# Patient Record
Sex: Male | Born: 1940 | Race: White | Hispanic: No | Smoking: Former smoker
Health system: Southern US, Community
[De-identification: ages and names within clinical notes are randomized; demographics above are authoritative.]

## PROBLEM LIST (undated history)

## (undated) DIAGNOSIS — N200 Calculus of kidney: Secondary | ICD-10-CM

## (undated) DIAGNOSIS — M1990 Unspecified osteoarthritis, unspecified site: Secondary | ICD-10-CM

## (undated) DIAGNOSIS — R42 Dizziness and giddiness: Secondary | ICD-10-CM

## (undated) DIAGNOSIS — I10 Essential (primary) hypertension: Secondary | ICD-10-CM

## (undated) DIAGNOSIS — R0602 Shortness of breath: Secondary | ICD-10-CM

## (undated) DIAGNOSIS — I219 Acute myocardial infarction, unspecified: Secondary | ICD-10-CM

## (undated) DIAGNOSIS — Z9079 Acquired absence of other genital organ(s): Secondary | ICD-10-CM

## (undated) DIAGNOSIS — K219 Gastro-esophageal reflux disease without esophagitis: Secondary | ICD-10-CM

## (undated) DIAGNOSIS — J449 Chronic obstructive pulmonary disease, unspecified: Secondary | ICD-10-CM

## (undated) DIAGNOSIS — K222 Esophageal obstruction: Secondary | ICD-10-CM

## (undated) DIAGNOSIS — K5792 Diverticulitis of intestine, part unspecified, without perforation or abscess without bleeding: Secondary | ICD-10-CM

## (undated) DIAGNOSIS — I2510 Atherosclerotic heart disease of native coronary artery without angina pectoris: Secondary | ICD-10-CM

## (undated) DIAGNOSIS — N400 Enlarged prostate without lower urinary tract symptoms: Secondary | ICD-10-CM

## (undated) DIAGNOSIS — E785 Hyperlipidemia, unspecified: Secondary | ICD-10-CM

## (undated) HISTORY — DX: Hyperlipidemia, unspecified: E78.5

## (undated) HISTORY — PX: THYROID SURGERY: SHX805

## (undated) HISTORY — PX: BACK SURGERY: SHX140

## (undated) HISTORY — DX: Esophageal obstruction: K22.2

## (undated) HISTORY — DX: Atherosclerotic heart disease of native coronary artery without angina pectoris: I25.10

## (undated) HISTORY — PX: APPENDECTOMY: SHX54

## (undated) HISTORY — PX: CHOLECYSTECTOMY: SHX55

## (undated) HISTORY — PX: CORONARY ANGIOPLASTY: SHX604

---

## 1998-06-09 ENCOUNTER — Ambulatory Visit (HOSPITAL_COMMUNITY): Admission: RE | Admit: 1998-06-09 | Discharge: 1998-06-09 | Payer: Self-pay | Admitting: Internal Medicine

## 2001-03-05 ENCOUNTER — Emergency Department (HOSPITAL_COMMUNITY): Admission: EM | Admit: 2001-03-05 | Discharge: 2001-03-05 | Payer: Self-pay | Admitting: Unknown Physician Specialty

## 2001-03-06 ENCOUNTER — Encounter: Payer: Self-pay | Admitting: Emergency Medicine

## 2002-02-20 ENCOUNTER — Ambulatory Visit (HOSPITAL_COMMUNITY): Admission: RE | Admit: 2002-02-20 | Discharge: 2002-02-20 | Payer: Self-pay | Admitting: Internal Medicine

## 2002-02-20 ENCOUNTER — Encounter (INDEPENDENT_AMBULATORY_CARE_PROVIDER_SITE_OTHER): Payer: Self-pay | Admitting: Specialist

## 2002-02-21 ENCOUNTER — Encounter: Payer: Self-pay | Admitting: Internal Medicine

## 2002-06-24 ENCOUNTER — Emergency Department (HOSPITAL_COMMUNITY): Admission: EM | Admit: 2002-06-24 | Discharge: 2002-06-24 | Payer: Self-pay | Admitting: Emergency Medicine

## 2002-06-24 ENCOUNTER — Encounter: Payer: Self-pay | Admitting: Emergency Medicine

## 2003-02-05 ENCOUNTER — Emergency Department (HOSPITAL_COMMUNITY): Admission: EM | Admit: 2003-02-05 | Discharge: 2003-02-05 | Payer: Self-pay | Admitting: Emergency Medicine

## 2003-02-08 ENCOUNTER — Emergency Department (HOSPITAL_COMMUNITY): Admission: EM | Admit: 2003-02-08 | Discharge: 2003-02-08 | Payer: Self-pay | Admitting: Emergency Medicine

## 2003-02-08 ENCOUNTER — Encounter: Payer: Self-pay | Admitting: Emergency Medicine

## 2003-03-17 ENCOUNTER — Encounter: Payer: Self-pay | Admitting: Orthopedic Surgery

## 2003-03-17 ENCOUNTER — Encounter: Admission: RE | Admit: 2003-03-17 | Discharge: 2003-03-17 | Payer: Self-pay | Admitting: Orthopedic Surgery

## 2003-03-28 ENCOUNTER — Encounter: Payer: Self-pay | Admitting: Orthopedic Surgery

## 2003-03-28 ENCOUNTER — Encounter: Admission: RE | Admit: 2003-03-28 | Discharge: 2003-03-28 | Payer: Self-pay | Admitting: Orthopedic Surgery

## 2003-12-13 DIAGNOSIS — I219 Acute myocardial infarction, unspecified: Secondary | ICD-10-CM

## 2003-12-13 HISTORY — DX: Acute myocardial infarction, unspecified: I21.9

## 2003-12-13 HISTORY — PX: ANGIOPLASTY: SHX39

## 2004-04-21 ENCOUNTER — Emergency Department (HOSPITAL_COMMUNITY): Admission: EM | Admit: 2004-04-21 | Discharge: 2004-04-21 | Payer: Self-pay | Admitting: Emergency Medicine

## 2004-09-11 ENCOUNTER — Inpatient Hospital Stay (HOSPITAL_COMMUNITY): Admission: EM | Admit: 2004-09-11 | Discharge: 2004-09-15 | Payer: Self-pay | Admitting: Emergency Medicine

## 2005-02-23 ENCOUNTER — Ambulatory Visit (HOSPITAL_COMMUNITY): Admission: RE | Admit: 2005-02-23 | Discharge: 2005-02-23 | Payer: Self-pay | Admitting: Cardiology

## 2005-03-01 ENCOUNTER — Ambulatory Visit (HOSPITAL_COMMUNITY): Admission: RE | Admit: 2005-03-01 | Discharge: 2005-03-01 | Payer: Self-pay | Admitting: Cardiology

## 2006-03-06 ENCOUNTER — Ambulatory Visit (HOSPITAL_COMMUNITY): Admission: RE | Admit: 2006-03-06 | Discharge: 2006-03-06 | Payer: Self-pay | Admitting: Orthopedic Surgery

## 2006-08-11 ENCOUNTER — Emergency Department: Payer: Self-pay | Admitting: Emergency Medicine

## 2006-11-07 ENCOUNTER — Emergency Department (HOSPITAL_COMMUNITY): Admission: EM | Admit: 2006-11-07 | Discharge: 2006-11-07 | Payer: Self-pay | Admitting: Emergency Medicine

## 2006-12-11 ENCOUNTER — Emergency Department (HOSPITAL_COMMUNITY): Admission: EM | Admit: 2006-12-11 | Discharge: 2006-12-12 | Payer: Self-pay | Admitting: Emergency Medicine

## 2007-02-07 ENCOUNTER — Encounter: Admission: RE | Admit: 2007-02-07 | Discharge: 2007-02-07 | Payer: Self-pay | Admitting: Orthopedic Surgery

## 2008-01-07 ENCOUNTER — Ambulatory Visit (HOSPITAL_COMMUNITY): Admission: RE | Admit: 2008-01-07 | Discharge: 2008-01-07 | Payer: Self-pay | Admitting: Cardiology

## 2008-01-17 ENCOUNTER — Ambulatory Visit (HOSPITAL_COMMUNITY): Admission: RE | Admit: 2008-01-17 | Discharge: 2008-01-17 | Payer: Self-pay | Admitting: Cardiology

## 2008-09-27 ENCOUNTER — Ambulatory Visit (HOSPITAL_COMMUNITY): Admission: RE | Admit: 2008-09-27 | Discharge: 2008-09-27 | Payer: Self-pay | Admitting: Orthopedic Surgery

## 2008-12-29 ENCOUNTER — Observation Stay (HOSPITAL_COMMUNITY): Admission: EM | Admit: 2008-12-29 | Discharge: 2009-01-01 | Payer: Self-pay | Admitting: *Deleted

## 2008-12-29 ENCOUNTER — Ambulatory Visit: Payer: Self-pay | Admitting: Cardiovascular Disease

## 2008-12-29 ENCOUNTER — Ambulatory Visit: Payer: Self-pay | Admitting: *Deleted

## 2008-12-31 ENCOUNTER — Encounter: Payer: Self-pay | Admitting: Infectious Diseases

## 2009-04-16 ENCOUNTER — Encounter (INDEPENDENT_AMBULATORY_CARE_PROVIDER_SITE_OTHER): Payer: Self-pay | Admitting: *Deleted

## 2009-04-16 ENCOUNTER — Emergency Department (HOSPITAL_COMMUNITY): Admission: EM | Admit: 2009-04-16 | Discharge: 2009-04-17 | Payer: Self-pay | Admitting: Emergency Medicine

## 2009-05-04 ENCOUNTER — Ambulatory Visit (HOSPITAL_BASED_OUTPATIENT_CLINIC_OR_DEPARTMENT_OTHER): Admission: RE | Admit: 2009-05-04 | Discharge: 2009-05-04 | Payer: Self-pay | Admitting: Urology

## 2009-05-04 ENCOUNTER — Encounter (INDEPENDENT_AMBULATORY_CARE_PROVIDER_SITE_OTHER): Payer: Self-pay | Admitting: Urology

## 2009-08-22 ENCOUNTER — Ambulatory Visit: Payer: Self-pay | Admitting: Gastroenterology

## 2009-08-22 ENCOUNTER — Inpatient Hospital Stay (HOSPITAL_COMMUNITY): Admission: EM | Admit: 2009-08-22 | Discharge: 2009-08-24 | Payer: Self-pay | Admitting: Emergency Medicine

## 2009-08-24 ENCOUNTER — Encounter: Payer: Self-pay | Admitting: Internal Medicine

## 2009-08-25 ENCOUNTER — Encounter: Payer: Self-pay | Admitting: Internal Medicine

## 2009-09-24 ENCOUNTER — Telehealth: Payer: Self-pay | Admitting: Internal Medicine

## 2009-09-30 ENCOUNTER — Ambulatory Visit: Payer: Self-pay | Admitting: Gastroenterology

## 2009-09-30 DIAGNOSIS — C187 Malignant neoplasm of sigmoid colon: Secondary | ICD-10-CM | POA: Insufficient documentation

## 2009-09-30 DIAGNOSIS — I2510 Atherosclerotic heart disease of native coronary artery without angina pectoris: Secondary | ICD-10-CM | POA: Insufficient documentation

## 2009-10-07 ENCOUNTER — Telehealth: Payer: Self-pay | Admitting: Gastroenterology

## 2009-10-08 ENCOUNTER — Telehealth (INDEPENDENT_AMBULATORY_CARE_PROVIDER_SITE_OTHER): Payer: Self-pay | Admitting: *Deleted

## 2010-04-05 ENCOUNTER — Telehealth: Payer: Self-pay | Admitting: Gastroenterology

## 2010-04-06 ENCOUNTER — Encounter: Admission: RE | Admit: 2010-04-06 | Discharge: 2010-04-06 | Payer: Self-pay | Admitting: Otolaryngology

## 2010-04-13 ENCOUNTER — Other Ambulatory Visit: Admission: RE | Admit: 2010-04-13 | Discharge: 2010-04-13 | Payer: Self-pay | Admitting: Interventional Radiology

## 2010-04-13 ENCOUNTER — Encounter: Payer: Self-pay | Admitting: Gastroenterology

## 2010-04-13 ENCOUNTER — Encounter: Admission: RE | Admit: 2010-04-13 | Discharge: 2010-04-13 | Payer: Self-pay | Admitting: Otolaryngology

## 2010-06-22 ENCOUNTER — Encounter (HOSPITAL_COMMUNITY): Admission: RE | Admit: 2010-06-22 | Discharge: 2010-08-18 | Payer: Self-pay | Admitting: Cardiology

## 2010-06-29 ENCOUNTER — Ambulatory Visit (HOSPITAL_COMMUNITY): Admission: RE | Admit: 2010-06-29 | Discharge: 2010-06-29 | Payer: Self-pay | Admitting: Cardiology

## 2010-07-08 ENCOUNTER — Emergency Department (HOSPITAL_COMMUNITY): Admission: EM | Admit: 2010-07-08 | Discharge: 2010-07-08 | Payer: Self-pay | Admitting: Emergency Medicine

## 2010-07-22 ENCOUNTER — Ambulatory Visit (HOSPITAL_COMMUNITY): Admission: RE | Admit: 2010-07-22 | Discharge: 2010-07-23 | Payer: Self-pay | Admitting: Otolaryngology

## 2010-07-22 ENCOUNTER — Encounter (INDEPENDENT_AMBULATORY_CARE_PROVIDER_SITE_OTHER): Payer: Self-pay | Admitting: Otolaryngology

## 2010-09-03 ENCOUNTER — Telehealth: Payer: Self-pay | Admitting: Gastroenterology

## 2010-09-06 ENCOUNTER — Ambulatory Visit: Payer: Self-pay | Admitting: Gastroenterology

## 2010-09-06 DIAGNOSIS — R1032 Left lower quadrant pain: Secondary | ICD-10-CM

## 2010-09-08 ENCOUNTER — Telehealth: Payer: Self-pay | Admitting: Gastroenterology

## 2010-09-08 ENCOUNTER — Encounter (INDEPENDENT_AMBULATORY_CARE_PROVIDER_SITE_OTHER): Payer: Self-pay | Admitting: *Deleted

## 2010-09-27 ENCOUNTER — Telehealth: Payer: Self-pay | Admitting: Gastroenterology

## 2010-11-18 ENCOUNTER — Ambulatory Visit: Payer: Self-pay | Admitting: Gastroenterology

## 2010-11-20 ENCOUNTER — Emergency Department (HOSPITAL_COMMUNITY)
Admission: EM | Admit: 2010-11-20 | Discharge: 2010-11-20 | Payer: Self-pay | Source: Home / Self Care | Admitting: Emergency Medicine

## 2010-11-29 ENCOUNTER — Encounter: Payer: Self-pay | Admitting: Gastroenterology

## 2011-01-01 ENCOUNTER — Encounter: Payer: Self-pay | Admitting: Internal Medicine

## 2011-01-02 ENCOUNTER — Encounter: Payer: Self-pay | Admitting: Orthopedic Surgery

## 2011-01-09 ENCOUNTER — Emergency Department (HOSPITAL_COMMUNITY)
Admission: EM | Admit: 2011-01-09 | Discharge: 2011-01-09 | Payer: Self-pay | Source: Home / Self Care | Admitting: Emergency Medicine

## 2011-01-09 LAB — COMPREHENSIVE METABOLIC PANEL
ALT: 20 U/L (ref 0–53)
AST: 21 U/L (ref 0–37)
Alkaline Phosphatase: 52 U/L (ref 39–117)
CO2: 27 mEq/L (ref 19–32)
Calcium: 8.9 mg/dL (ref 8.4–10.5)
Chloride: 106 mEq/L (ref 96–112)
GFR calc Af Amer: >60 mL/min (ref >60)
GFR calc non Af Amer: >60 mL/min (ref >60)
Glucose, Bld: 87 mg/dL (ref 70–99)
Sodium: 141 mEq/L (ref 135–145)
Total Bilirubin: 0.5 mg/dL (ref 0.3–1.2)

## 2011-01-09 LAB — DIFFERENTIAL
Basophils Absolute: 0 10*3/uL (ref 0.0–0.1)
Basophils Relative: 0 % (ref 0–1)
Eosinophils Relative: 2 % (ref 0–5)
Monocytes Absolute: 0.7 10*3/uL (ref 0.1–1.0)
Monocytes Relative: 9 % (ref 3–12)

## 2011-01-09 LAB — LIPASE, BLOOD: Lipase: 23 U/L (ref 11–59)

## 2011-01-09 LAB — CBC
HCT: 37.1 % — ABNORMAL LOW (ref 39.0–52.0)
Hemoglobin: 12.2 g/dL — ABNORMAL LOW (ref 13.0–17.0)
MCH: 26.6 pg (ref 26.0–34.0)
MCHC: 32.9 g/dL (ref 30.0–36.0)
RDW: 14.7 % (ref 11.5–15.5)

## 2011-01-09 LAB — POCT CARDIAC MARKERS: Myoglobin, poc: 60.9 ng/mL (ref 12–200)

## 2011-01-11 ENCOUNTER — Encounter
Admission: RE | Admit: 2011-01-11 | Discharge: 2011-01-11 | Payer: Self-pay | Source: Home / Self Care | Attending: Orthopedic Surgery | Admitting: Orthopedic Surgery

## 2011-01-13 NOTE — Progress Notes (Signed)
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  I explained that to him at his last OV.  Schedule sig  ---- 04/01/2010 9:21 AM, Merri Ray CMA (AAMA) wrote: Jeanene Erb pt to inform him that he needed a 6 month follow up flex sig by your last office note on pt from october. Pt states he does not know anything about that and only knows about recall colon for 1 year. Do you still want me to schedule pt ------------------------------  Phone Note Outgoing Call Call back at [facility] Phone [phone]   Call placed by: Merri Ray CMA Duncan Dull),  April 05, 2010 9:47 AM Summary of Call: Called pt to inform that he needed to schedule the Flex, Pt will return my call he was on the way out the door for a doctors appointment Initial call taken by: Merri Ray CMA Duncan Dull),  April 05, 2010 9:48 AM  Follow-up for Phone Call        Tried to call pt again no answer. Will try again on Monday then mail out a reminder letter Follow-up by: Merri Ray CMA Duncan Dull),  April 09, 2010 3:21 PM  Additional Follow-up for Phone Call Additional follow up Details #1::        Mailed appointment letter today for pt to call back and schedule his Flex Sigmoidoscopy. This is very important that he gets this scheduled Additional Follow-up by: Merri Ray CMA Duncan Dull),  Apr 13, 2010 10:20 AM

## 2011-01-13 NOTE — Progress Notes (Signed)
Summary: cx fee?  Phone Note Call from Patient   Caller: Westley Hummer, wife Call For: [name] Reason for Call: Talk to Doctor Summary of Call: wife called this morning to cancel CIOL scheduled for this Wednesday... reason being, they are out of town for a "family emergency"... will c/b to reschedule when know more [name], do you wish to charge this pt a cancellation fee? Initial call taken by: Vallarie Mare,  September 27, 2010 9:24 AM  Follow-up for Phone Call        no Follow-up by: Louis Meckel MD,  September 27, 2010 10:17 AM

## 2011-01-13 NOTE — Progress Notes (Signed)
Summary: Triage-Abd. Pain  Phone Note Call from Patient Call back at Home Phone [phone]   Caller: wife Call For: [name] Reason for Call: Talk to Nurse Summary of Call: would like to be worked in for lower abd pain... doesnt want to wait until next available Initial call taken by: Vallarie Mare,  September 03, 2010 9:42 AM  Follow-up for Phone Call        Pt. c/o intermittent, lower abd. pain since Tuesday.  Denies constipation, diarrhea, blood, black stools, fever, n/v.   1) See Dr.Kaplan on 09-06-10 at 8:45am 2) Soft,bland diet. No spicy,greasy,fried foods.  3) tylenol/Ibuprofen as needed 4) Heating pad to abdomen as needed. 5) If symptoms become worse call back immediately or go to ER.  Follow-up by: Laureen Ochs LPN,  September 03, 2010 11:09 AM

## 2011-01-13 NOTE — Letter (Signed)
Summary: Miralax Instructions  [facility]  [address]   [place], [place] [address]   Phone: [phone]  Fax: [phone]       Francisco Collier    Nov 25, 1941    MRN: [id number]       Procedure Day /Date: Wednesday, 09-29-10     Arrival Time:  1:00 p.m.     Procedure Time: 2:00 p.m.     Location of Procedure:                    x   [facility] (4th Floor)   PREPARATION FOR COLONOSCOPY WITH MIRALAX  Starting 5 days prior to your procedure 09-24-10 do not eat nuts, seeds, popcorn, corn, beans, peas,  salads, or any raw vegetables.  Do not take any fiber supplements (e.g. Metamucil, Citrucel, and Benefiber). ____________________________________________________________________________________________________   THE DAY BEFORE YOUR PROCEDURE         DATE: 09-28-10 DAY: Tuesday  1   Drink clear liquids the entire day-NO SOLID FOOD  2   Do not drink anything colored red or purple.  Avoid juices with pulp.  No orange juice.  3   Drink at least 64 oz. (8 glasses) of fluid/clear liquids during the day to prevent dehydration and help the prep work efficiently.  CLEAR LIQUIDS INCLUDE: Water Jello Ice Popsicles Tea (sugar ok, no milk/cream) Powdered fruit flavored drinks Coffee (sugar ok, no milk/cream) Gatorade Juice: apple, white grape, white cranberry  Lemonade Clear bullion, consomm, broth Carbonated beverages (any kind) Strained chicken noodle soup Hard Candy  4   Mix the entire bottle of Miralax with 64 oz. of Gatorade/Powerade in the morning and put in the refrigerator to chill.  5   At 3:00 pm take 2 Dulcolax/Bisacodyl tablets.  6   At 4:30 pm take one Reglan/Metoclopramide tablet.  7  Starting at 5:00 pm drink one 8 oz glass of the Miralax mixture every 15-20 minutes until you have finished drinking the entire 64 oz.  You should finish drinking prep around 7:30 or 8:00 pm.  8   If you are nauseated, you may take the 2nd  Reglan/Metoclopramide tablet at 6:30 pm.        9    At 8:00 pm take 2 more DULCOLAX/Bisacodyl tablets.     THE DAY OF YOUR PROCEDURE      DATE:  09-29-10  DAY: Wednesday  You may drink clear liquids until  12:00 p.m.  (2 HOURS BEFORE PROCEDURE).   MEDICATION INSTRUCTIONS  Unless otherwise instructed, you should take regular prescription medications with a small sip of water as early as possible the morning of your procedure.            OTHER INSTRUCTIONS  You will need a responsible adult at least 68 years of age to accompany you and drive you home.   This person must remain in the waiting room during your procedure.  Wear loose fitting clothing that is easily removed.  Leave jewelry and other valuables at home.  However, you may wish to bring a book to read or an iPod/MP3 player to listen to music as you wait for your procedure to start.  Remove all body piercing jewelry and leave at home.  Total time from sign-in until discharge is approximately 2-3 hours.  You should go home directly after your procedure and rest.  You can resume normal activities the day after your procedure.  The day of your procedure you should not:  Drive   Make legal decisions   Operate machinery   Drink alcohol   Return to work  You will receive specific instructions about eating, activities and medications before you leave.   The above instructions have been reviewed and explained to me by   Karl Bales RN  September 08, 2010 9:38 AM    I fully understand and can verbalize these instructions _____________________________ Date _______  Instructions mailed to patient

## 2011-01-13 NOTE — Assessment & Plan Note (Signed)
Summary: WORSENING LOWER ABD. PAIN             DEBORAH   History of Present Illness Visit Type: Follow-up Visit Primary GI MD: Melvia Heaps MD [facility] Primary Provider: Winn Jock. Little, MD  Requesting Provider: n/a Chief Complaint: lower abd pain  History of Present Illness:   Francisco Collier has returned for evaluation of abdominal pain.  For approximately one week he has had persistent moderately severe left lower quadrant pain.  He is without fever.  He has a history of diverticulosis.  Approximate year ago a malignant polyp was removed at colonoscopy.  Polyp was broad-based.  Followup sigmoidoscopy was recommended in 6 months but this was not done.  He also had left lower quadrant pain approximately year ago and was treated for "infection".  He is without bleeding.   GI Review of Systems    Reports abdominal pain.     Location of  Abdominal pain: lower abdomen.    Denies acid reflux, belching, bloating, chest pain, dysphagia with liquids, dysphagia with solids, heartburn, loss of appetite, nausea, vomiting, vomiting blood, weight loss, and  weight gain.        Denies anal fissure, black tarry stools, change in bowel habit, constipation, diarrhea, diverticulosis, fecal incontinence, heme positive stool, hemorrhoids, irritable bowel syndrome, jaundice, light color stool, liver problems, rectal bleeding, and  rectal pain.    Current Medications (verified): 1)  Aspirin 81 Mg Tbec (Aspirin) .Marland Kitchen.. 1 By Mouth Once Daily 2)  Toprol Xl 50 Mg Xr24h-Tab (Metoprolol Succinate) .Marland Kitchen.. 1 By Mouth Once Daily 3)  Trilipix 135 Mg Cpdr (Choline Fenofibrate) .... One Tablet By Mouth Once Daily 4)  Lisinopril 10 Mg Tabs (Lisinopril) .... One Tablet By Mouth Once Daily  Allergies (verified): No Known Drug Allergies  Past History:  Past Medical History: Reviewed history from 09/30/2009 and no changes required. CAD Diverticulosis Esophageal Stricture Hypertension Benign prostatic  hypertrophy Dyslipidemia colon polyps Arthritis Kidney Stones  Past Surgical History: Appendectomy Angioplasty/stent  Thyroid Surgery  Family History: Reviewed history from 09/30/2009 and no changes required. No FH of Colon Cancer: Lung Cancer: Father   Social History: Reviewed history from 09/30/2009 and no changes required. Occupation: Retired Married No childern Patient is a former smoker.  Alcohol Use - no Illicit Drug Use - no  Review of Systems       The patient complains of arthritis/joint pain, back pain, and fatigue.  The patient denies allergy/sinus, anemia, anxiety-new, blood in urine, breast changes/lumps, change in vision, confusion, cough, coughing up blood, depression-new, fainting, fever, headaches-new, hearing problems, heart murmur, heart rhythm changes, itching, muscle pains/cramps, night sweats, nosebleeds, shortness of breath, skin rash, sleeping problems, sore throat, swelling of feet/legs, swollen lymph glands, thirst - excessive, urination - excessive, urination changes/pain, urine leakage, vision changes, and voice change.         All other systems were reviewed and were negative   Vital Signs:  Patient profile:   70 year old male Height:      73 inches Weight:      232 pounds BMI:     30.72 BSA:     2.29 Pulse rate:   76 / minute Pulse rhythm:   regular BP sitting:   132 / 80  (left arm) Cuff size:   regular  Vitals Entered By: Ok Anis CMA (September 06, 2010 8:19 AM)  Physical Exam  Additional Exam:  Is a well-developed well-nourished male  skin: anicteric HEENT: normocephalic; PEERLA; no nasal or  pharyngeal abnormalities neck: supple nodes: no cervical lymphadenopathy chest: clear to ausculatation and percussion heart: no murmurs, gallops, or rubs abd: soft, nontender; BS normoactive; no abdominal masses,organomegaly; there is moderate tenderness to palpation in the left lower quadrant without guarding or rebound rectal:  deferred ext: no cynanosis, clubbing, edema skeletal: no deformities neuro: oriented x 3; no focal abnormalities    Impression & Recommendations:  Problem # 1:  ABDOMINAL PAIN, LEFT LOWER QUADRANT (ICD-789.04)  He likely has low-grade diverticulitis.  Recommendations #1 begin Cipro 500 mg b.i.d. for 10 days and Flagyl 250 mg t.i.d. 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  I will defer this for 2-3 weeks to allow his diverticulitis to subside.  Orders: Colonoscopy (Colon)  Patient Instructions: 1)  Copy sent to : Winn Jock. Little, MD  2)  Colonoscopy and Flexible Sigmoidoscopy brochure given.  3)  Conscious Sedation brochure given.  4)  Your colonoscopy is scheduled on 09/29/2010 at 2pm 5)  The medication list was reviewed and reconciled.  All changed / newly prescribed medications were explained.  A complete medication list was provided to the patient / caregiver. Prescriptions: MOVIPREP 100 GM  SOLR (PEG-KCL-NACL-NASULF-NA ASC-C) As per prep instructions.  #1 x 0   Entered by:   Merri Ray CMA (AAMA)   Authorized by:   Louis Meckel MD   Signed by:   Merri Ray CMA (AAMA) on 09/06/2010   Method used:   Electronically to        [facility] (retail)       [address]       [place], [place]  [address]       Ph: [phone]       Fax: [phone]   RxID:   [id number] FLAGYL 250 MG TABS (METRONIDAZOLE) 1 by mouth three times a day for 10 days  #30 x 0   Entered by:   Merri Ray CMA (AAMA)   Authorized by:   Louis Meckel MD   Signed by:   Merri Ray CMA (AAMA) on 09/06/2010   Method used:   Electronically to        [facility] (retail)       [address]       [place], [place]  [address]       Ph: [phone]       Fax: [phone]   RxID:   [id number] CIPRO 500 MG TABS  (CIPROFLOXACIN HCL) 1 by mouth two times a day for 10 days  #20 x 0   Entered by:   Merri Ray CMA (AAMA)   Authorized by:   Louis Meckel MD   Signed by:   Merri Ray CMA (AAMA) on 09/06/2010   Method used:   Electronically to        [facility] (retail)       [address]       [place], [place]  [address]       Ph: [phone]       Fax: [phone]   RxID:   [id number]

## 2011-01-13 NOTE — Letter (Signed)
Summary: 3rd Appointment Reminder for Flex Sigmoidoscopy  [facility]  [address] [place], [place] [address]   Phone: [phone]  Fax: [phone]        Apr 13, 2010 MRN: [id number]    [facility] [address] [place], [place]  [address]    Dear Mr. Pieper,   We have been unable to reach you by phone to schedule a follow up   [facility] Sigmoidoscopy appointment that was recommended for you by  Dr.Kaplan. It is very important that we reach you to schedule the   procedure. We hope that you allow [place] to participate in your health   care needs. Please contact us at [phone] at your earliest   convenience to schedule your appointment.           Sincerely,   Robert Kaplan,MD    Merri Ray CMA (AAMA)

## 2011-01-13 NOTE — Letter (Signed)
Summary: [facility] Instructions  [facility]  [address] [place], [place] [address]   Phone: [phone]  Fax: [phone]       Nakai Seifried    Jan 11, 1941    MRN: [id number]        Procedure Day /Date:WEDNESDAY  09/29/2010     Arrival Time:1PM     Procedure Time:2PM     Location of Procedure:                    X   [facility] (4th Floor)                        PREPARATION FOR COLONOSCOPY WITH MOVIPREP   Starting 5 days prior to your procedure10/14/2011 do not eat nuts, seeds, popcorn, corn, beans, peas,  salads, or any raw vegetables.  Do not take any fiber supplements (e.g. Metamucil, Citrucel, and Benefiber).  THE DAY BEFORE YOUR PROCEDURE         DATE: 09/28/2010 DAY: TUESDAY  1.  Drink clear liquids the entire day-NO SOLID FOOD  2.  Do not drink anything colored red or purple.  Avoid juices with pulp.  No orange juice.  3.  Drink at least 64 oz. (8 glasses) of fluid/clear liquids during the day to prevent dehydration and help the prep work efficiently.  CLEAR LIQUIDS INCLUDE: Water Jello Ice Popsicles Tea (sugar ok, no milk/cream) Powdered fruit flavored drinks Coffee (sugar ok, no milk/cream) Gatorade Juice: apple, white grape, white cranberry  Lemonade Clear bullion, consomm, broth Carbonated beverages (any kind) Strained chicken noodle soup Hard Candy                             4.  In the morning, mix first dose of MoviPrep solution:    Empty 1 Pouch A and 1 Pouch B into the disposable container    Add lukewarm drinking water to the top line of the container. Mix to dissolve    Refrigerate (mixed solution should be used within 24 hrs)  5.  Begin drinking the prep at 5:00 p.m. The MoviPrep container is divided by 4 marks.   Every 15 minutes drink the solution down to the next mark (approximately 8 oz) until the full liter is complete.   6.  Follow completed prep with 16 oz of clear liquid of your choice (Nothing red  or purple).  Continue to drink clear liquids until bedtime.  7.  Before going to bed, mix second dose of MoviPrep solution:    Empty 1 Pouch A and 1 Pouch B into the disposable container    Add lukewarm drinking water to the top line of the container. Mix to dissolve    Refrigerate  THE DAY OF YOUR PROCEDURE      DATE: 09/29/2010 DAY: WEDNESDAY  Beginning at 9a.m. (5 hours before procedure):         1. Every 15 minutes, drink the solution down to the next mark (approx 8 oz) until the full liter is complete.  2. Follow completed prep with 16 oz. of clear liquid of your choice.    3. You may drink clear liquids until12PM(2 HOURS BEFORE PROCEDURE).   MEDICATION INSTRUCTIONS  Unless otherwise instructed, you should take regular prescription medications with a small sip of water   as early as possible the morning of your procedure.  OTHER INSTRUCTIONS  You will need a responsible adult at least 68 years of age to accompany you and drive you home.   This person must remain in the waiting room during your procedure.  Wear loose fitting clothing that is easily removed.  Leave jewelry and other valuables at home.  However, you may wish to bring a book to read or  an iPod/MP3 player to listen to music as you wait for your procedure to start.  Remove all body piercing jewelry and leave at home.  Total time from sign-in until discharge is approximately 2-3 hours.  You should go home directly after your procedure and rest.  You can resume normal activities the  day after your procedure.  The day of your procedure you should not:   Drive   Make legal decisions   Operate machinery   Drink alcohol   Return to work  You will receive specific instructions about eating, activities and medications before you leave.    The above instructions have been reviewed and explained to me by   _______________________    I fully understand and can verbalize these  instructions _____________________________ Date _________

## 2011-01-13 NOTE — Letter (Signed)
Summary: Patient Notice- Polyp Results   [facility]  [address] [place], [place] [address]   Phone: [phone]  Fax: [phone]        November 29, 2010 MRN: [id number]    [facility] [address] [place], [place]  [address]    Dear Francisco Collier,  I am pleased to inform you that the colon polyp(s) removed during your recent colonoscopy was (were) found to be benign (no cancer detected) upon pathologic examination.  I recommend you have a repeat colonoscopy examination in 3_ years to look for recurrent polyps, as having colon polyps increases your risk for having recurrent polyps or even colon cancer in the future.  Should you develop new or worsening symptoms of abdominal pain, bowel habit changes or bleeding from the rectum or bowels, please schedule an evaluation with either your primary care physician or with me.  Additional information/recommendations:  __ No further action with gastroenterology is needed at this time. Please      follow-up with your primary care physician for your other healthcare      needs.  __ Please call [phone] to schedule a return visit to review your      situation.  __ Please keep your follow-up visit as already scheduled.  __x Continue treatment plan as outlined the day of your exam.  Please call us if you are having persistent problems or have questions about your condition that have not been fully answered at this time.  Sincerely Barbette Hair. Arlyce Dice, MD This letter has been electronically signed by your physician.  Appended Document: Patient Notice- Polyp Results letter mailed

## 2011-01-13 NOTE — Progress Notes (Signed)
Summary: Medication  Phone Note Call from Patient Call back at Home Phone [phone]   Caller: Patient Call For: [name] Reason for Call: Talk to Nurse Summary of Call: Miralax is too expensive...needs an alternative Initial call taken by: Karna Christmas,  September 08, 2010 11:05 AM  Follow-up for Phone Call        Converted from flag ---- ---- 09/08/2010 11:28 AM, Louis Meckel MD wrote: Try Mg citrate 2 bottles on eve before procedure, 1 bottle 4 hours before procedure.  Drink plenty of clear liquids  ---- 09/08/2010 11:22 AM, Karl Bales RN wrote: I already spoke to pt's wife this morning- changed prep to Miralax due to cost of Moviprep.  She is calling now because Mirlax ($26.00) is too expensive and they cannot afford it on their fixed income.  Please advise ------------------------------   Additional Follow-up for Phone Call Additional follow up Details #1::        Spoke with pt's wife and informed her of [name] above orders.  Understanding voiced.  No further questions at this time Additional Follow-up by: Karl Bales RN,  September 08, 2010 11:37 AM

## 2011-01-13 NOTE — Procedures (Signed)
Summary: Colonoscopy  Patient: Francisco Collier Note: All result statuses are Final unless otherwise noted.  Tests: (1) Colonoscopy (COL)   COL Colonoscopy           DONE     [facility]     [address]     [place], [place]  [address]           COLONOSCOPY PROCEDURE REPORT           PATIENT:  Francisco Collier  MR#:  [id number]     BIRTHDATE:  [birth year], 69 yrs. old  GENDER:  male           ENDOSCOPIST:  Barbette Hair. Arlyce Dice, MD     Referred by:  Aida Puffer, M.D.           PROCEDURE DATE:  11/18/2010     PROCEDURE:  Colonoscopy with snare polypectomy, Colon with cold     biopsy polypectomy     ASA CLASS:  Class II     INDICATIONS:  1) screening  2) history of colon cancer 2010     malignant polyp removed           MEDICATIONS:   Fentanyl 75 mcg IV, Versed 10 mg IV           DESCRIPTION OF PROCEDURE:   After the risks benefits and     alternatives of the procedure were thoroughly explained, informed     consent was obtained.  Digital rectal exam was performed and     revealed no abnormalities.   The LB [id number] endoscope     was introduced through the anus and advanced to the cecum, which     was identified by the ileocecal valve, without limitations.  The     quality of the prep was Moviprep fair.  The instrument was then     slowly withdrawn as the colon was fully examined.     <<PROCEDUREIMAGES>>           FINDINGS:  A sessile polyp was found in the distal transverse     colon. It was 3 mm in size. The polyp was removed using cold     biopsy forceps (see image6).  A sessile polyp was found in the     sigmoid colon. It was 3 mm in size. It was found 22 cm from the     point of entry. The polyp was removed using cold biopsy forceps     (see image13).  There were multiple polyps identified and removed.     in the rectum and sigmoid colon. Multiple 2-35mm hyperplastic     appearing polyps in rectum and sigmoid to 15cm (see image16 and     image15). Largest  polyp was removed with cold snare and submitted     to patholgy  Mild diverticulosis was found in the sigmoid colon     (see image11).  This was otherwise a normal examination of the     colon (see image1, image4, image14, and image17).   Retroflexed     views in the rectum revealed no abnormalities.    The time to     cecum =  1.75  minutes. The scope was then withdrawn (time =     13.75  min) from the patient and the procedure completed.           COMPLICATIONS:  None           ENDOSCOPIC IMPRESSION:  1) 3 mm sessile polyp in the distal transverse colon     2) 3 mm sessile polyp in the sigmoid colon     3) Polyps, multiple in the rectum and sigmoid colon     4) Mild diverticulosis in the sigmoid colon     5) Otherwise normal examination     RECOMMENDATIONS:     1) Colonoscopy 3 years           REPEAT EXAM:   3 year(s) Colonoscopy           ______________________________     Barbette Hair. Arlyce Dice, MD           CC:           n.     eSIGNED:   Barbette Hair. Kaplan at 11/18/2010 04:24 PM           Francisco Collier, [id number]  Note: An exclamation mark (!) indicates a result that was not dispersed into the flowsheet. Document Creation Date: 11/18/2010 4:24 PM _______________________________________________________________________  (1) Order result status: Final Collection or observation date-time: 11/18/2010 16:14 Requested date-time:  Receipt date-time:  Reported date-time:  Referring Physician:   Ordering Physician: Melvia Heaps [phone]) Specimen Source:  Source: Launa Grill Order Number: [id number] Lab site:   Appended Document: Colonoscopy     Procedures Next Due Date:    Colonoscopy: 11/2013

## 2011-01-13 NOTE — Progress Notes (Signed)
Summary: phone note, prep changed to due pt not able to afford Moviprep  Phone Note Call from Patient Call back at Home Phone [phone] Call back at Work Phone [phone]   Caller: wife Call For: [name] Reason for Call: Talk to Nurse Summary of Call: cannot afford prep and would like something cheaper... [facility] on [address] Initial call taken by: Vallarie Mare,  September 08, 2010 9:08 AM    New/Updated Medications: DULCOLAX 5 MG  TBEC (BISACODYL) Day before procedure take 2 at 3pm and 2 at 8pm. METOCLOPRAMIDE HCL 10 MG  TABS (METOCLOPRAMIDE HCL) As per prep instructions. MIRALAX   POWD (POLYETHYLENE GLYCOL 3350) As per prep  instructions. Prescriptions: MIRALAX   POWD (POLYETHYLENE GLYCOL 3350) As per prep  instructions.  #255gm x 0   Entered by:   Karl Bales RN   Authorized by:   Louis Meckel MD   Signed by:   Karl Bales RN on 09/08/2010   Method used:   Electronically to        [facility] (retail)       [address]       [place], [place]  [address]       Ph: [phone]       Fax: [phone]   RxID:   [id number] METOCLOPRAMIDE HCL 10 MG  TABS (METOCLOPRAMIDE HCL) As per prep instructions.  #2 x 0   Entered by:   Karl Bales RN   Authorized by:   Louis Meckel MD   Signed by:   Karl Bales RN on 09/08/2010   Method used:   Electronically to        [facility] (retail)       [address]       [place], [place]  [address]       Ph: [phone]       Fax: [phone]   RxID:   [id number] DULCOLAX 5 MG  TBEC (BISACODYL) Day before procedure take 2 at 3pm and 2 at 8pm.  #4 x 0   Entered by:   Karl Bales RN   Authorized by:   Louis Meckel MD   Signed by:   Karl Bales RN on 09/08/2010   Method used:   Electronically to        [facility] (retail)       [address]       [place], [place]  [address]      Ph: [phone]       Fax: [phone]   RxID:   [id number]  Spoke with  pt's wife who states that they "absolutely cannot afford the Moviprep for the procedure."  Miralax prep sent to [facility] [address] pharmacy and instructions for procedure mailed to pt.  Wife told to call back if she has further questions

## 2011-01-19 ENCOUNTER — Encounter (HOSPITAL_COMMUNITY)
Admission: RE | Admit: 2011-01-19 | Discharge: 2011-01-19 | Disposition: A | Discharge status: Home / Self Care | Payer: Medicare Other | Source: Ambulatory Visit | Attending: Specialist | Admitting: Specialist

## 2011-01-19 DIAGNOSIS — Z01812 Encounter for preprocedural laboratory examination: Secondary | ICD-10-CM | POA: Insufficient documentation

## 2011-01-19 DIAGNOSIS — M5126 Other intervertebral disc displacement, lumbar region: Secondary | ICD-10-CM | POA: Insufficient documentation

## 2011-01-19 LAB — COMPREHENSIVE METABOLIC PANEL
ALT: 23 U/L (ref 0–53)
Albumin: 3.8 g/dL (ref 3.5–5.2)
Calcium: 9.1 mg/dL (ref 8.4–10.5)
GFR calc Af Amer: >60 mL/min (ref >60)
Glucose, Bld: 105 mg/dL — ABNORMAL HIGH (ref 70–99)
Potassium: 4.8 mEq/L (ref 3.5–5.1)
Sodium: 135 mEq/L (ref 135–145)
Total Protein: 6.8 g/dL (ref 6.0–8.3)

## 2011-01-19 LAB — DIFFERENTIAL
Basophils Absolute: 0 10*3/uL (ref 0.0–0.1)
Basophils Relative: 0 % (ref 0–1)
Eosinophils Absolute: 0.1 10*3/uL (ref 0.0–0.7)
Eosinophils Relative: 2 % (ref 0–5)
Monocytes Absolute: 0.9 10*3/uL (ref 0.1–1.0)

## 2011-01-19 LAB — CBC
HCT: 40.3 % (ref 39.0–52.0)
MCHC: 33.7 g/dL (ref 30.0–36.0)
Platelets: 332 10*3/uL (ref 150–400)
RDW: 14.7 % (ref 11.5–15.5)
WBC: 8.6 10*3/uL (ref 4.0–10.5)

## 2011-01-19 LAB — SURGICAL PCR SCREEN: Staphylococcus aureus: NEGATIVE

## 2011-01-24 ENCOUNTER — Ambulatory Visit (HOSPITAL_COMMUNITY): Admission: RE | Admit: 2011-01-24 | Payer: Medicare Other | Source: Ambulatory Visit | Admitting: Specialist

## 2011-01-24 ENCOUNTER — Inpatient Hospital Stay (HOSPITAL_COMMUNITY)
Admission: RE | Admit: 2011-01-24 | Discharge: 2011-01-27 | DRG: 490 | Disposition: A | Discharge status: Home / Self Care | Payer: Medicare Other | Source: Ambulatory Visit | Attending: Specialist | Admitting: Specialist

## 2011-01-24 ENCOUNTER — Inpatient Hospital Stay (HOSPITAL_COMMUNITY): Payer: Medicare Other

## 2011-01-24 DIAGNOSIS — Z9861 Coronary angioplasty status: Secondary | ICD-10-CM

## 2011-01-24 DIAGNOSIS — I2510 Atherosclerotic heart disease of native coronary artery without angina pectoris: Secondary | ICD-10-CM | POA: Diagnosis present

## 2011-01-24 DIAGNOSIS — Z01812 Encounter for preprocedural laboratory examination: Secondary | ICD-10-CM

## 2011-01-24 DIAGNOSIS — M129 Arthropathy, unspecified: Secondary | ICD-10-CM | POA: Diagnosis present

## 2011-01-24 DIAGNOSIS — K219 Gastro-esophageal reflux disease without esophagitis: Secondary | ICD-10-CM | POA: Diagnosis present

## 2011-01-24 DIAGNOSIS — Z87891 Personal history of nicotine dependence: Secondary | ICD-10-CM

## 2011-01-24 DIAGNOSIS — M48061 Spinal stenosis, lumbar region without neurogenic claudication: Principal | ICD-10-CM | POA: Diagnosis present

## 2011-01-24 DIAGNOSIS — D62 Acute posthemorrhagic anemia: Secondary | ICD-10-CM | POA: Diagnosis not present

## 2011-01-24 DIAGNOSIS — I252 Old myocardial infarction: Secondary | ICD-10-CM

## 2011-01-24 LAB — APTT: aPTT: 27 seconds (ref 24–37)

## 2011-01-24 LAB — PROTIME-INR: INR: 1.01 (ref 0.00–1.49)

## 2011-01-25 LAB — CROSSMATCH
ABO/RH(D): A POS
Antibody Screen: NEGATIVE
Unit division: 0

## 2011-01-25 LAB — POCT I-STAT 4, (NA,K, GLUC, HGB,HCT)
Glucose, Bld: 113 mg/dL — ABNORMAL HIGH (ref 70–99)
Hemoglobin: 10.5 g/dL — ABNORMAL LOW (ref 13.0–17.0)
Potassium: 5.3 mEq/L — ABNORMAL HIGH (ref 3.5–5.1)
Sodium: 139 mEq/L (ref 135–145)

## 2011-01-25 LAB — BASIC METABOLIC PANEL
CO2: 25 mEq/L (ref 19–32)
Calcium: 7.8 mg/dL — ABNORMAL LOW (ref 8.4–10.5)
Chloride: 103 mEq/L (ref 96–112)
GFR calc Af Amer: >60 mL/min (ref >60)
Sodium: 134 mEq/L — ABNORMAL LOW (ref 135–145)

## 2011-01-26 LAB — HEMOGLOBIN AND HEMATOCRIT, BLOOD: Hemoglobin: 8.7 g/dL — ABNORMAL LOW (ref 13.0–17.0)

## 2011-02-08 NOTE — Op Note (Signed)
NAME:  Francisco Collier, Francisco Collier               ACCOUNT NO.:  [id number]  MEDICAL RECORD NO.:  [id number]           PATIENT TYPE:  I  LOCATION:  5025                         FACILITY:  [facility]  PHYSICIAN:  Kerrin Champagne, M.D.   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  The patient found to have severe foraminal entrapment right side greater than left affecting the L3, L4 and L5 nerve roots.  PROCEDURE:  Central decompressive laminectomy at L3-4 and L4-5 with removal of the spinous process and central portions of the lamina at each level.  Bilateral foraminotomies at L3, L4 and L5 levels. Operating room microscope was used for this procedure.  SURGEON:  Kerrin Champagne, MD  ASSISTANT:  Wende Neighbors, PA-C  ANESTHESIA:  General via orotracheal intubation, Dr. Krista Blue, supplemented with local infiltration of Marcaine 0.5% with 1:200,000 epinephrine 15 mL.  ESTIMATED BLOOD LOSS:  1300 mL.  DRAINS:  Foley to straight drain Hemovac x1, right lumbar.  COMPLICATIONS:  None.  BRIEF MEDICAL HISTORY:  The patient is a 70 year old male.  He has a long history of low back pain with claudication.  He has undergone previous epidural steroid injections with good relief of pain; however, most recently his pain has worsened to the point where he is unable to ambulate more than 100 feet without severe back pain and right lower extremity greater than left lower extremity pain.  His pain relief with sitting, bending, stooping and leaning over cart at the grocery store. He has some worsening discomfort with standing in  one place extending the back.  His sleep is markedly impaired secondary to back pain and leg pain.  He is brought to the operating room after further attempts to re- incise, then unsuccessful relieving his discomfort.  He has undergone preoperative evaluation demonstrating significant spinal stenosis at L3- 4 and L4-5 level spinal canal narrowing, especially in the lateral recess areas bilaterally at L3-4 and L4-5 foraminal entrapment.  The patient understands the risks and benefits of the procedure as a history of cardiac stent or MI in the past.  He has been cleared by his cardiologist, [name].  DESCRIPTION OF PROCEDURE:  This patient was seen in the preoperative holding area and has no allergies.  He received standard preoperative antibiotics of Ancef.  All questions were answered.  He has signed informed consent and marking of the back labeling L4-3 and L4-5, my initials right side greater than left noted.  Then taken to the OR via stretcher [facility] operating room OR #4, it is for the procedure.  There, he underwent induction of general anesthetic.  Foley catheter was placed.  He was then turned to a prone position on the [place] spine frame.  All pressure points were well padded.  PAS hose  were used intraoperatively.  The patient had standard prep with DuraPrep solution mid-dorsal spine to the mid sacral level.  Draped in the usual manner, iodine Vi-drape was used.  Standard time-out protocol identifying the patient, procedure and levels to be done.  Iliac crest was identified, this is in the level of L4.  Incision was then made approximately 2-2.5 inches in length through skin and subcutaneous layers directly down to the spinous processes at the expected L3, L4 and L5 levels.  Incision was carried over both sides of the spinous processes at the expected L3, L4, and L5 levels.  Kocher clamp was placed on either side of the spinous process of L4.  This would be  at the L4-5 level and at the L3-4 level.  Intraoperative lateral radiograph demonstrated the clamps on either side of the L4 spinous process.  The clamps were carefully removed and the areas where they were marked with a surgical marking pen.  Continued exposure then performed using Cobb elevators to elevate the paralumbar muscles, electrocautery to divide the extensive amount of muscle attachment to the patient's posterior elements extending from L2 to L5 bilaterally.  Bleeders were controlled using bipolar and monopolar electrocautery.  The boss McCullough retractor was inserted.  Self-retaining retractor was placed.  Leksell rongeur was then used to remove the spinous process of L4 and of L3 and about 50% of the inferior aspect of the spinous process of L2.  Small amount of superior portion of the L5 spinous process was resected about 10-15%.  Leksell rongeur was used to further thin central portions of the lamina at L4 and L3 and debride further soft tissue that was located over the posterior aspect of the interspace laterally.  Bleeders were again controlled using electrocautery.  This patient showed great deal of venous bleeding during the case even with the initial exposure significant veins.  Large joints that unfortunately covered the areas of the facet branches of the arteries and veins.  Loupe magnification and headlamp were used during this portion of the procedure.  Osteotome was then used carefully thin the posterior elements and then resect portion of the medial aspect of the inferior articular process of L4 bilaterally approximately 15-20% as well as at the L3 level bilaterally and L2 bilaterally.  Osteotome was used to further resect central portions of the lamina.  Thinning this area.  A high-speed bur was used to further thin the posterior aspect of the lamina at L4 and L3.  A 3-mm Kerrison then able to be introduced the midline and central portions of the lamina were  resected at L4 and L3.  Osteotomes were then used to resect further off the medial aspect of the lamina bilaterally opening the central laminotomy defect.  The resection was further carried out bilaterally until withdraw for about 15-16 mm in width was present from L4-5 to L3-4 superiorly.  Note that with resection, the very cranial portions of neural arch and cottonoids were placed over the thecal sac and were protected with resection centrally using Kerrison centrally. Ligamentum flavum was extremely hypertrophic both L3-4 and L4-5.  The Ligamentum flavum was first resected off superior aspect of the lamina of L5 and then foraminotomy was performed over both L5 nerve roots identified the medial aspect of the L5 pedicle and then osteotome was used to resect superior articular process of L5 bilaterally decompressing lateral recess and allowing for resection of the ligamentum flavum in a smooth fashion.  This was done bilaterally at the L4-5 level  and then at L3-4.  At L2-3, there was significant hypertrophic ligamentum flavum and again superior reticular process of L3 was resected in the region of lateral recess particularly on the right side in order to decompress the right L3 nerve root, its entry point into the neural foramen at L3-4.  With loupe magnification and headlamp, then carefully each neural foramen was decompressed first the L5, then L4, then L3 resecting reflected portions of the ligamentum flavum beneath the superior articular process of L5 and the L4-5 level and the L4 level at the L3-4 level.  Enough ligamentum flavum and superior portion of the superior articular processes were resected such with a hockey stick neuroprobe and blunt tip.  Nerve hook could be easily passed out the neural foramen demonstrating patency of L5, L4 and L3 of the right side.  Great deal of venous bleeding encountered, bipolar electrocautery unsuccessful in controlling bleeding, so  thrombin- soaked Gelfoam and cottonoids were used in order to control the bleeding here.  Bone wax was applied to bleeding cancellus bone surfaces. Returning to the left side and then in changing to the opposite side of the table to the right side of the table from left side.  Similarly, osteotomes were used to perform some resection of the medial aspect of the facet at the L4-5 level and L3-4 levels again identifying the medial portion of the pedicle of L5 and then of L4 and using this as a determination for osteotome resection of the superior articular process of L5 and of L4.  Ligamentum flavum and this wafer bone resected and smoothed the each segment decompressing the lateral recess quite nicely. Hockey stick neuroprobe passed out over the L5 nerve root demonstrating its patency, then over the L4 nerve root protecting the nerve root resection of the reflected portions of ligamentum flavum at the L4-5 level, it was carried out on the left side and similarly this was done at L3-4.  Medial portion of the L2-3, facet was also partially resected using osteotomes over about 10-15%.  The operating room microscope was sterilely draped and brought into the field under the operating room microscope, then each neural foramen lateral recess was carefully reexamined and further decompression carried out using two 3-mm Kerrisons to ensure that each neural foramen had been well decompressed first on the right side then on the left.  Bleeders were controlled using bipolar electrocautery.  Gelfoam and thrombin-soaked bone wax applied to the bleeding cancellous bone surfaces.  Excess bone wax was removed.  FloSeal was used at the end of the case.  There were several areas where bleeding was still present even despite the use of the pressure, cottonoids, and Gelfoam.  FloSeal provided excellent hemostasis.  At the end of the case, the patient has very minimal oozing present.  Irrigation was carried out  copious amounts of irrigant solution.  Medium Hemovac drain was placed in the depth of incision after again checking to insure that all neural foramen were well decompressed.  Ligamentum flavum at the L2-3 level was resected up to its attachment to the inferior aspect of the L2 laminas.  With the medium Hemovac drain in place, then the retractors were removed, a small amount of devitalized muscle was debrided along the edge of the incision.  Bleeding was controlled using bipolar and monopolar electrocautery.  The paralumbar muscles were loosely approximated in the midline with interrupted #1 Vicryl sutures.  Lumbodorsal fascia was reattached to the spinous process of L5 and at L2 using #1  Vicryl sutures in simple fashion.  The lumbodorsal fascia was reapproximated to itself over the laminotomy site using interrupted #1 Vicryl sutures, deep subcu layers were approximated with interrupted #1 and then 0 Vicryl sutures, more superficial layers with interrupted 2-0 Vicryl sutures.  The skin was closed with a running subcu stitch of 4-0 Vicryl. The skin was then closed with Octylseal  and then Mepilex bandage applied.  The patient was then returned to the supine position, reactivated, extubated, returned to recovery room in satisfactory condition.  PHYSICIAN ASSISTANT'S RESPONSIBILITY:  Maud Deed performed duties of an Designer, television/film set.  Sheaths were operated under the operating room microscope, performed retraction of the delicate neural structures, suctioning of the delicate neural structures and protection and that were needed.  She also assisted in resection of bone where osteotomized bone was performed.  She was present from the very beginning of the case to the very end of the case.  She performed closure from the fascia to the skin and application of dressings.  She assisted in positioning the patient and in removal of the patient from the OR table.  Without her assistance,  surgery certainly would have been very difficult procedure indeed.  At the end of the case, all instrument and sponge counts were correct.     Kerrin Champagne, M.D.     JEN/MEDQ  D:  01/25/2011  T:  01/26/2011  Job:  [id number]  Electronically Signed by Vira Browns M.D. on 02/08/2011 09:23:04 AM

## 2011-02-21 LAB — POCT I-STAT, CHEM 8
Calcium, Ion: 1.17 mmol/L (ref 1.12–1.32)
Glucose, Bld: 112 mg/dL — ABNORMAL HIGH (ref 70–99)
HCT: 38 % — ABNORMAL LOW (ref 39.0–52.0)
TCO2: 26 mmol/L (ref 0–100)

## 2011-02-21 LAB — DIFFERENTIAL
Eosinophils Absolute: 0.1 10*3/uL (ref 0.0–0.7)
Eosinophils Relative: 2 % (ref 0–5)
Lymphocytes Relative: 25 % (ref 12–46)
Lymphs Abs: 1.5 10*3/uL (ref 0.7–4.0)
Monocytes Absolute: 0.5 10*3/uL (ref 0.1–1.0)
Monocytes Relative: 8 % (ref 3–12)

## 2011-02-21 LAB — HEPATIC FUNCTION PANEL
ALT: 23 U/L (ref 0–53)
Alkaline Phosphatase: 66 U/L (ref 39–117)
Bilirubin, Direct: 0.1 mg/dL (ref 0.0–0.3)
Indirect Bilirubin: 0.2 mg/dL — ABNORMAL LOW (ref 0.3–0.9)
Total Bilirubin: 0.3 mg/dL (ref 0.3–1.2)

## 2011-02-21 LAB — URINALYSIS, ROUTINE W REFLEX MICROSCOPIC
Bilirubin Urine: NEGATIVE
Glucose, UA: NEGATIVE mg/dL
Hgb urine dipstick: NEGATIVE
Specific Gravity, Urine: 1.015 (ref 1.005–1.030)
Urobilinogen, UA: 0.2 mg/dL (ref 0.0–1.0)

## 2011-02-21 LAB — CBC
HCT: 37.5 % — ABNORMAL LOW (ref 39.0–52.0)
Hemoglobin: 12.2 g/dL — ABNORMAL LOW (ref 13.0–17.0)
MCH: 26.1 pg (ref 26.0–34.0)
MCV: 80.1 fL (ref 78.0–100.0)
Platelets: 276 10*3/uL (ref 150–400)
RBC: 4.68 MIL/uL (ref 4.22–5.81)

## 2011-02-21 LAB — LIPASE, BLOOD: Lipase: 22 U/L (ref 11–59)

## 2011-02-25 LAB — CBC
HCT: 39 % (ref 39.0–52.0)
Hemoglobin: 12.4 g/dL — ABNORMAL LOW (ref 13.0–17.0)
MCH: 25.8 pg — ABNORMAL LOW (ref 26.0–34.0)
MCHC: 31.8 g/dL (ref 30.0–36.0)
MCV: 81.1 fL (ref 78.0–100.0)
Platelets: 296 10*3/uL (ref 150–400)
RBC: 4.81 MIL/uL (ref 4.22–5.81)
RDW: 14.9 % (ref 11.5–15.5)
WBC: 7.1 10*3/uL (ref 4.0–10.5)

## 2011-02-25 LAB — BASIC METABOLIC PANEL WITH GFR
CO2: 24 meq/L (ref 19–32)
GFR calc non Af Amer: >60 mL/min (ref >60)
Glucose, Bld: 99 mg/dL (ref 70–99)
Potassium: 4.1 meq/L (ref 3.5–5.1)
Sodium: 137 meq/L (ref 135–145)

## 2011-02-25 LAB — BASIC METABOLIC PANEL
BUN: 11 mg/dL (ref 6–23)
Calcium: 8.8 mg/dL (ref 8.4–10.5)
Chloride: 108 mEq/L (ref 96–112)
Creatinine, Ser: 1.16 mg/dL (ref 0.4–1.5)
GFR calc Af Amer: >60 mL/min (ref >60)

## 2011-02-25 LAB — URINALYSIS, ROUTINE W REFLEX MICROSCOPIC
Bilirubin Urine: NEGATIVE
Glucose, UA: NEGATIVE mg/dL
Hgb urine dipstick: NEGATIVE
Ketones, ur: NEGATIVE mg/dL
Nitrite: NEGATIVE
Protein, ur: NEGATIVE mg/dL
Specific Gravity, Urine: 1.026 (ref 1.005–1.030)
Urobilinogen, UA: 1 mg/dL (ref 0.0–1.0)
pH: 5.5 (ref 5.0–8.0)

## 2011-02-25 LAB — SURGICAL PCR SCREEN
MRSA, PCR: NEGATIVE
Staphylococcus aureus: NEGATIVE

## 2011-02-26 LAB — CBC
HCT: 35.3 % — ABNORMAL LOW (ref 39.0–52.0)
MCHC: 34.5 g/dL (ref 30.0–36.0)
MCV: 79.2 fL (ref 78.0–100.0)
Platelets: 228 10*3/uL (ref 150–400)
RDW: 16.1 % — ABNORMAL HIGH (ref 11.5–15.5)

## 2011-02-26 LAB — POCT CARDIAC MARKERS
Myoglobin, poc: 68.1 ng/mL (ref 12–200)
Troponin i, poc: <0.05 ng/mL (ref 0.00–0.09)

## 2011-02-26 LAB — COMPREHENSIVE METABOLIC PANEL
Albumin: 3.4 g/dL — ABNORMAL LOW (ref 3.5–5.2)
Alkaline Phosphatase: 62 U/L (ref 39–117)
BUN: 11 mg/dL (ref 6–23)
Calcium: 9 mg/dL (ref 8.4–10.5)
Creatinine, Ser: 1.17 mg/dL (ref 0.4–1.5)
Glucose, Bld: 117 mg/dL — ABNORMAL HIGH (ref 70–99)
Total Protein: 6.2 g/dL (ref 6.0–8.3)

## 2011-02-26 LAB — DIFFERENTIAL
Basophils Relative: 2 % — ABNORMAL HIGH (ref 0–1)
Lymphocytes Relative: 23 % (ref 12–46)
Lymphs Abs: 1.4 10*3/uL (ref 0.7–4.0)
Monocytes Relative: 8 % (ref 3–12)
Neutro Abs: 3.8 10*3/uL (ref 1.7–7.7)
Neutrophils Relative %: 66 % (ref 43–77)

## 2011-02-26 LAB — URINALYSIS, ROUTINE W REFLEX MICROSCOPIC
Glucose, UA: NEGATIVE mg/dL
Protein, ur: NEGATIVE mg/dL
Specific Gravity, Urine: 1.024 (ref 1.005–1.030)

## 2011-03-02 NOTE — Discharge Summary (Signed)
NAME:  Francisco Collier, Francisco Collier               ACCOUNT NO.:  [id number]  MEDICAL RECORD NO.:  [id number]           PATIENT TYPE:  I  LOCATION:  5025                         FACILITY:  [facility]  PHYSICIAN:  Kerrin Champagne, M.D.   DATE OF BIRTH:  12-16-1940  DATE OF ADMISSION:  01/24/2011 DATE OF DISCHARGE:  01/27/2011                              DISCHARGE SUMMARY   ADMISSION DIAGNOSES: 1. Moderately severe lumbar spinal stenosis at L3-4 and L4-5 with     small disk herniation and worsening neurogenic claudication. 2. Hypertension. 3. Coronary artery disease status post stent placement in 2005. 4. Gastroesophageal reflux disease.  DISCHARGE DIAGNOSES: 1. Moderately severe lumbar spinal stenosis at L3-4 and L4-5 with     small disk herniations and worsening neurogenic claudication.  The     patient found to have severe foraminal entrapment, right side     greater than left, affecting the L3, L4, and L5 nerve roots     intraoperatively.  Remaining diagnosis same as above and including     acute blood loss anemia requiring blood transfusion. 2. Constipation, resolved at discharge.  PROCEDURE:  On January 24, 2011, the patient underwent central decompressive laminectomy at L3-4 and L4-5 with removal of the spinous process and central portions of the lamina at each level.  Bilateral foraminotomies at L3-L4 and L5 levels.  Operating room microscope used for the procedure.  This was performed by Dr. Otelia Sergeant, assisted by Maud Deed, PA-C, under general anesthesia.  CONSULTATIONS:  None.  BRIEF HISTORY:  The patient is a 70 year old male with chronic low back pain and progressive worsening of neurogenic claudication.  He has been treated with epidural steroid injections, but continues to have severe limitations with ambulation.  At this point, he has severe back pain with right greater than left lower extremity pain and is unable to walk more than 100 feet.  He is unable to do his grocery  shopping.  He has to lean over the cart for release during grocery shopping.  He is unable to sleep.  As he has failed conservative treatment, it was felt he would benefit from surgical intervention as his studies did indicate significant spinal stenosis at L3-4 and L4-5, especially in the lateral recess areas bilaterally at L3-4 and L4-5 with foraminal entrapment. The patient did have cardiac history and was cleared by [name] for the procedure.  BRIEF HOSPITAL COURSE:  The patient tolerated the procedure under general anesthesia.  Postoperatively, his blood pressure did drop into the 80s.  He was placed on the telemetry unit.  Hemoglobin and hematocrit dropped postoperatively and he was transfused with 2 units of packed red blood cells having had approximately 1300 mL of blood loss intraoperatively.  His hemoglobin dropped from 13.6 on admission to 9.5. Following the blood transfusion, he returned to 9.5, and at discharge hemoglobin was 8.7 with hematocrit 26.3.  He was stable without signs or symptoms of the posthemorrhagic anemia.  The patient was slow with return of his bowel function.  His diet was held until he had bowel sounds and flatus.  The patient eventually was able to take  a regular diet without difficulties.  He was started on ambulation and gait training by physical therapist.  He was able to walk in the hallway prior to discharge.  Neurovascular motor function of the lower extremities remained intact throughout the hospital stay.  The patient's dressing was changed daily and his wound was found to be healing without drainage.  Prior to discharge, he was afebrile and vital signs were stable.  The patient was discharged to his home on January 27, 2011.  PERTINENT LABORATORY VALUES:  Hemoglobin and hematocrit as stated above. Chemistry studies on admission were within normal limits. Postoperatively, potassium elevated at 5.3 but returned to normal value of 4.0.  One  episode of hyponatremia at 134.  He was on preoperative screening for methicillin-resistant Staph aureus and Staph aureus were both negative.  PLAN:  The patient will be discharged to his home.  No physical therapy was felt to be necessary.  He will continue to ambulate as tolerated. He will be allowed to shower.  Dressing changes will be daily or as needed.  He will continue with over-the-counter MiraLax and his home medications as taken prior to admission.  MEDICATIONS GIVEN AT DISCHARGE: 1. Robaxin 500 mg one every 8 hours as needed for spasm. 2. Percocet 5/325 one to two every 4-6 hours as needed for pain.  He will follow up in 2 weeks with Dr. Otelia Sergeant.  If there are questions or concerns regarding his orthopedic status, he will call prior to his office visit.  All questions encouraged and answered.  CONDITION ON DISCHARGE:  Stable.     Wende Neighbors, P.A.   ______________________________ Kerrin Champagne, M.D.    SMV/MEDQ  D:  02/17/2011  T:  02/17/2011  Job:  [id number]  Electronically Signed by Dorna Mai. on 02/25/2011 02:04:12 PM Electronically Signed by Vira Browns M.D. on 03/02/2011 11:20:36 PM

## 2011-03-18 LAB — URINALYSIS, ROUTINE W REFLEX MICROSCOPIC
Glucose, UA: NEGATIVE mg/dL
Hgb urine dipstick: NEGATIVE
Ketones, ur: NEGATIVE mg/dL
Protein, ur: NEGATIVE mg/dL

## 2011-03-18 LAB — DIFFERENTIAL
Basophils Absolute: 0 10*3/uL (ref 0.0–0.1)
Basophils Absolute: 0 10*3/uL (ref 0.0–0.1)
Basophils Relative: 0 % (ref 0–1)
Basophils Relative: 0 % (ref 0–1)
Eosinophils Absolute: 0.1 10*3/uL (ref 0.0–0.7)
Monocytes Absolute: 0.5 10*3/uL (ref 0.1–1.0)
Monocytes Relative: 9 % (ref 3–12)
Neutro Abs: 3.9 10*3/uL (ref 1.7–7.7)
Neutro Abs: 5.2 10*3/uL (ref 1.7–7.7)
Neutrophils Relative %: 58 % (ref 43–77)
Neutrophils Relative %: 71 % (ref 43–77)

## 2011-03-18 LAB — HEMOGLOBIN AND HEMATOCRIT, BLOOD
HCT: 30.4 % — ABNORMAL LOW (ref 39.0–52.0)
HCT: 31.7 % — ABNORMAL LOW (ref 39.0–52.0)
HCT: 31.9 % — ABNORMAL LOW (ref 39.0–52.0)
HCT: 32.2 % — ABNORMAL LOW (ref 39.0–52.0)
HCT: 33.8 % — ABNORMAL LOW (ref 39.0–52.0)
Hemoglobin: 10.9 g/dL — ABNORMAL LOW (ref 13.0–17.0)
Hemoglobin: 11 g/dL — ABNORMAL LOW (ref 13.0–17.0)
Hemoglobin: 11.1 g/dL — ABNORMAL LOW (ref 13.0–17.0)

## 2011-03-18 LAB — CBC
HCT: 32 % — ABNORMAL LOW (ref 39.0–52.0)
HCT: 37.9 % — ABNORMAL LOW (ref 39.0–52.0)
Hemoglobin: 11.1 g/dL — ABNORMAL LOW (ref 13.0–17.0)
Hemoglobin: 13 g/dL (ref 13.0–17.0)
MCHC: 34.2 g/dL (ref 30.0–36.0)
RBC: 3.83 MIL/uL — ABNORMAL LOW (ref 4.22–5.81)
RDW: 16.5 % — ABNORMAL HIGH (ref 11.5–15.5)
WBC: 6.7 10*3/uL (ref 4.0–10.5)

## 2011-03-18 LAB — COMPREHENSIVE METABOLIC PANEL
ALT: 18 U/L (ref 0–53)
Albumin: 3.1 g/dL — ABNORMAL LOW (ref 3.5–5.2)
Alkaline Phosphatase: 51 U/L (ref 39–117)
Alkaline Phosphatase: 63 U/L (ref 39–117)
BUN: 12 mg/dL (ref 6–23)
BUN: 14 mg/dL (ref 6–23)
Chloride: 103 mEq/L (ref 96–112)
GFR calc non Af Amer: >60 mL/min (ref >60)
Glucose, Bld: 107 mg/dL — ABNORMAL HIGH (ref 70–99)
Glucose, Bld: 122 mg/dL — ABNORMAL HIGH (ref 70–99)
Potassium: 4 mEq/L (ref 3.5–5.1)
Potassium: 4.1 mEq/L (ref 3.5–5.1)
Sodium: 137 mEq/L (ref 135–145)
Total Bilirubin: 0.7 mg/dL (ref 0.3–1.2)
Total Protein: 6.6 g/dL (ref 6.0–8.3)

## 2011-03-18 LAB — TYPE AND SCREEN
ABO/RH(D): A POS
Antibody Screen: NEGATIVE

## 2011-03-18 LAB — HEMOCCULT GUIAC POC 1CARD (OFFICE): Fecal Occult Bld: POSITIVE

## 2011-03-22 LAB — COMPREHENSIVE METABOLIC PANEL
Albumin: 3.6 g/dL (ref 3.5–5.2)
Alkaline Phosphatase: 56 U/L (ref 39–117)
BUN: 11 mg/dL (ref 6–23)
CO2: 26 mEq/L (ref 19–32)
Chloride: 108 mEq/L (ref 96–112)
Creatinine, Ser: 1.32 mg/dL (ref 0.4–1.5)
GFR calc non Af Amer: 54 mL/min — ABNORMAL LOW (ref >60)
Glucose, Bld: 115 mg/dL — ABNORMAL HIGH (ref 70–99)
Potassium: 3.9 mEq/L (ref 3.5–5.1)
Total Bilirubin: 0.6 mg/dL (ref 0.3–1.2)

## 2011-03-22 LAB — URINALYSIS, ROUTINE W REFLEX MICROSCOPIC
Bilirubin Urine: NEGATIVE
Hgb urine dipstick: NEGATIVE
Nitrite: NEGATIVE
Protein, ur: NEGATIVE mg/dL
Specific Gravity, Urine: 1.016 (ref 1.005–1.030)
Urobilinogen, UA: 0.2 mg/dL (ref 0.0–1.0)

## 2011-03-22 LAB — DIFFERENTIAL
Basophils Absolute: 0.1 10*3/uL (ref 0.0–0.1)
Basophils Relative: 1 % (ref 0–1)
Lymphocytes Relative: 22 % (ref 12–46)
Monocytes Absolute: 0.5 10*3/uL (ref 0.1–1.0)
Neutro Abs: 5.1 10*3/uL (ref 1.7–7.7)
Neutrophils Relative %: 69 % (ref 43–77)

## 2011-03-22 LAB — CBC
HCT: 36.9 % — ABNORMAL LOW (ref 39.0–52.0)
Hemoglobin: 12.9 g/dL — ABNORMAL LOW (ref 13.0–17.0)
MCV: 81.6 fL (ref 78.0–100.0)
Platelets: 282 10*3/uL (ref 150–400)
RBC: 4.53 MIL/uL (ref 4.22–5.81)
WBC: 7.4 10*3/uL (ref 4.0–10.5)

## 2011-03-22 LAB — URINE CULTURE: Colony Count: NO GROWTH

## 2011-03-22 LAB — POCT I-STAT 4, (NA,K, GLUC, HGB,HCT)
Glucose, Bld: 97 mg/dL (ref 70–99)
HCT: 42 % (ref 39.0–52.0)
Hemoglobin: 14.3 g/dL (ref 13.0–17.0)
Potassium: 4.3 mEq/L (ref 3.5–5.1)
Sodium: 137 mEq/L (ref 135–145)

## 2011-03-22 LAB — URINE MICROSCOPIC-ADD ON

## 2011-03-22 LAB — LIPASE, BLOOD: Lipase: 22 U/L (ref 11–59)

## 2011-03-28 LAB — RAPID URINE DRUG SCREEN, HOSP PERFORMED
Amphetamines: NOT DETECTED
Barbiturates: NOT DETECTED
Benzodiazepines: NOT DETECTED
Cocaine: NOT DETECTED
Opiates: NOT DETECTED

## 2011-03-28 LAB — CBC
HCT: 37.5 % — ABNORMAL LOW (ref 39.0–52.0)
Hemoglobin: 11.8 g/dL — ABNORMAL LOW (ref 13.0–17.0)
Hemoglobin: 13 g/dL (ref 13.0–17.0)
MCHC: 33.8 g/dL (ref 30.0–36.0)
MCHC: 34.5 g/dL (ref 30.0–36.0)
MCV: 81.9 fL (ref 78.0–100.0)
Platelets: 237 10*3/uL (ref 150–400)
RDW: 17.5 % — ABNORMAL HIGH (ref 11.5–15.5)
RDW: 17.6 % — ABNORMAL HIGH (ref 11.5–15.5)

## 2011-03-28 LAB — BASIC METABOLIC PANEL
BUN: 12 mg/dL (ref 6–23)
CO2: 27 mEq/L (ref 19–32)
Calcium: 9 mg/dL (ref 8.4–10.5)
Creatinine, Ser: 1.24 mg/dL (ref 0.4–1.5)
GFR calc non Af Amer: 59 mL/min — ABNORMAL LOW (ref >60)
Glucose, Bld: 88 mg/dL (ref 70–99)
Sodium: 140 mEq/L (ref 135–145)

## 2011-03-28 LAB — COMPREHENSIVE METABOLIC PANEL
Alkaline Phosphatase: 46 U/L (ref 39–117)
BUN: 9 mg/dL (ref 6–23)
Chloride: 105 mEq/L (ref 96–112)
Creatinine, Ser: 1.13 mg/dL (ref 0.4–1.5)
GFR calc non Af Amer: >60 mL/min (ref >60)
Glucose, Bld: 97 mg/dL (ref 70–99)
Potassium: 3.6 mEq/L (ref 3.5–5.1)
Total Bilirubin: 0.3 mg/dL (ref 0.3–1.2)

## 2011-03-28 LAB — TSH: TSH: 1.249 u[IU]/mL (ref 0.350–4.500)

## 2011-03-28 LAB — POCT I-STAT, CHEM 8
Calcium, Ion: 1.15 mmol/L (ref 1.12–1.32)
Chloride: 105 mEq/L (ref 96–112)
Glucose, Bld: 98 mg/dL (ref 70–99)
HCT: 38 % — ABNORMAL LOW (ref 39.0–52.0)
Hemoglobin: 12.9 g/dL — ABNORMAL LOW (ref 13.0–17.0)
Potassium: 3.9 mEq/L (ref 3.5–5.1)

## 2011-03-28 LAB — PROTIME-INR
INR: 1 (ref 0.00–1.49)
Prothrombin Time: 13.6 seconds (ref 11.6–15.2)

## 2011-03-28 LAB — URINALYSIS, ROUTINE W REFLEX MICROSCOPIC
Bilirubin Urine: NEGATIVE
Glucose, UA: NEGATIVE mg/dL
Hgb urine dipstick: NEGATIVE
Ketones, ur: NEGATIVE mg/dL
Protein, ur: NEGATIVE mg/dL

## 2011-03-28 LAB — POCT CARDIAC MARKERS
CKMB, poc: <1 ng/mL — ABNORMAL LOW (ref 1.0–8.0)
Troponin i, poc: <0.05 ng/mL (ref 0.00–0.09)

## 2011-03-28 LAB — HEMOGLOBIN A1C
Hgb A1c MFr Bld: 4.8 % (ref 4.6–6.1)
Mean Plasma Glucose: 91 mg/dL

## 2011-03-28 LAB — DIFFERENTIAL
Eosinophils Absolute: 0.1 10*3/uL (ref 0.0–0.7)
Lymphocytes Relative: 23 % (ref 12–46)
Monocytes Relative: 9 % (ref 3–12)

## 2011-03-28 LAB — APTT: aPTT: 24 seconds (ref 24–37)

## 2011-03-28 LAB — LIPID PANEL: VLDL: 24 mg/dL (ref 0–40)

## 2011-04-26 NOTE — Op Note (Signed)
NAME:  Francisco Collier, Francisco Collier               ACCOUNT NO.:  [id number]   MEDICAL RECORD NO.:  [id number]          PATIENT TYPE:  AMB   LOCATION:  [facility]                         FACILITY:  [facility]   PHYSICIAN:  Mark C. Vernie Ammons, M.D.  DATE OF BIRTH:  04/05/1941   DATE OF PROCEDURE:  05/04/2009  DATE OF DISCHARGE:                               OPERATIVE REPORT   PREOPERATIVE DIAGNOSIS:  Solid right testicular mass.   POSTOPERATIVE DIAGNOSIS:  Solid right testicular mass.   PROCEDURE:  Right inguinal orchiectomy.   SURGEON:  Mark C. Vernie Ammons, M.D.   ANESTHESIA:  General with local supplement.   SPECIMENS:  Right testicle and spermatic cord to pathology.   BLOOD LOSS:  Minimal.   DRAINS:  None.   COMPLICATIONS:  None.   INDICATIONS:  The patient is a 70 year old male who was seen in the  emergency room for left-sided pain.  A CT was obtained as well as a  scrotal ultrasound.  Despite the left-sided pain, he was found to have a  solid mass in his right testicle and the CT scan revealed no evidence of  pelvic or abdominal adenopathy or other signs of metastatic disease.  The scrotal ultrasound revealed an echogenic mass associated with the  right testicle measuring 1 cm in size with blood flow and alpha  fetoprotein, beta HCG and LDH levels were obtained and were found to be  negative for evidence of elevation.  We discussed the fact that this may  be a benign lesion, however, there is a possibility of malignancy and I  therefore have recommended orchiectomy via an inguinal approach.  I have  also discussed placement of testicular prosthesis but the patient has  declined.  I went over the procedure, its risks and complications as  well as alternatives and he understands and has elected to proceed with  surgery.   DESCRIPTION OF OPERATION:  After informed consent, the patient was  brought to the major OR, placed on the table in a supine position,  administered 1 gram of Ancef.  His genitalia  was sterilely prepped and  draped and an official time-out was then performed.  Palpation of the  testicle revealed of vague fullness to the right testicle in the region  of the upper pole but I could not definitely feel any fixation of the  testicle.  No inguinal adenopathy was palpable.   An incision was then made transversely in the area of the right inguinal  region and carried down through adipose tissue to the external oblique  aponeurosis.  This was cleared of overlying tissue and I incised the  fascia and then extended this proximally and distally.  I used a Kitner  to then dissect the cord from the floor of the inguinal canal and passed  a Penrose drain around the cord two times and placed this on traction  occluding the spermatic cord at the most proximal extent.  I then used  blunt technique to deliver the cord and right testicle from the right  hemiscrotum.  I palpated the hemiscrotum and noted where the scrotal  skin ended  and the gubernaculum began.  I then incised the gubernaculum  and fulgurated any bleeding points.   Attention was then directed to the cord and its structures.  I cleared  it from surrounding tissues and used a Kelly clamp to isolate the cord  into equal sized portions and clamped the two Kelly clamps as far  proximally as possible and then divided the cord distal to the clamps.  2-0 silk suture was then used to doubly ligate both cord portions and  these were observed for bleeding.  The 2-0 silk suture was left long.  The cord was allowed to drop into the retroperitoneum.   The ilioinguinal nerve was spared throughout the procedure and I then  irrigated the wound and closed the external oblique fascia with running  3-0 Vicryl suture.  I then injected a total of 20 mL of 0.5% plain  Marcaine in the subcutaneous tissue and also beneath the external  oblique fascia.  I then reapproximated the deep tissue with running 3-0  chromic suture.  Three interrupted  3-0 chromics were placed in the subcu  tissue in order to reapproximate the skin edges and take any tension off  of the closure.  I then closed the skin with running 4-0 Monocryl suture  and then sealed the wound with Dermabond.  The patient was awakened and  taken to recovery room in stable and satisfactory condition.  He  tolerated the procedure well with no intraoperative complications.  He  will be given a prescription for Tylox #36 and Keflex 500 mg t.i.d. for  5 days.      Mark C. Vernie Ammons, M.D.  Electronically Signed     MCO/MEDQ  D:  05/04/2009  T:  05/04/2009  Job:  [id number]

## 2011-04-26 NOTE — Cardiovascular Report (Signed)
NAME:  Francisco Collier, Francisco Collier               ACCOUNT NO.:  [id number]   MEDICAL RECORD NO.:  [id number]          PATIENT TYPE:  OIB   LOCATION:  2899                         FACILITY:  [facility]   PHYSICIAN:  Francisco Collier, M.D. DATE OF BIRTH:  [birth year]   DATE OF PROCEDURE:  01/17/2008  DATE OF DISCHARGE:  01/17/2008                            CARDIAC CATHETERIZATION   PROCEDURE:  Left cardiac cath with selective left and right coronary  angiography, left ventriculography via right groin using Judkins  technique.   INDICATIONS FOR PROCEDURE:  [name] is a 70 year old white male  with past medical history significant for anteroseptal wall MI,  hypertension, hypercholesteremia, glucose intolerance, GERD, blurred  degenerative joint disease, complains of retrosternal chest tightness  off and on associated with shortness of breath.  Also complains of  exertional dyspnea with minimal exertion.  Denies any nausea, vomiting,  diaphoresis.  Denies palpitation, lightheadedness or syncope.  Denies  PND, orthopnea, leg swelling.  Denies relation of chest pain to food,  breathing or movement.  The patient underwent Persantine Myoview on  January 07, 2008, which showed small regional focus of reversible  ischemia in the apical, anteroseptal and anterior wall with EF of 60%.   PAST MEDICAL HISTORY:  As above.   PAST SURGICAL HISTORY:  He had laparotomy many years ago for peptic  ulcer disease.   ALLERGIES:  NO KNOWN DRUG ALLERGIES.   HOME MEDICATIONS:  1. Enteric-coated aspirin 81 mg p.o. daily.  2. Plavix 75 mg p.o. daily.  3. Toprol 50 mg p.o. daily.  4. Nexium 40 mg p.o. daily.  5. Lipitor 40 mg p.o. daily.  6. Niaspan 500 mg two p.o. b.i.d.   SOCIAL HISTORY:  Patient is married, retired.  Smoked a half pack per  day for 30 years, quit 10 years ago.  No history of alcohol abuse.   FAMILY HISTORY:  Negative for coronary artery disease.   PHYSICAL EXAMINATION:  GENERAL APPEARANCE:  He  was alert and oriented  x3, in no acute distress.  VITAL SIGNS:  Blood pressure was 120/70, pulse was 81 regular.  HEENT:  Conjunctivae was pink.  NECK:  Supple.  No JVD, no bruit.  LUNGS:  Clear to auscultation without rhonchi or rales.  CARDIOVASCULAR:  Normal S1 and S2.  There was soft systolic murmur.  ABDOMEN:  Soft.  Bowel sounds present, nontender.  EXTREMITIES:  There was no clubbing, cyanosis or edema.   IMPRESSION:  1. New onset angina, positive Persantine Myoview, rule out coronary      insufficiency.  2. Coronary artery disease.  3. History of anteroseptal wall myocardial infarction in the past.  4. Hypertension.  5. Gastroesophageal reflux disease.  6. Hypercholesteremia.  7. History of tobacco abuse in the past.   Discussed with the patient regarding Persantine Myoview result and left  cath, possible percutaneous transluminal coronary angioplasty and  stenting, its risks and benefits; i.e., death, myocardial infarction,  stroke, need for emergency coronary artery bypass grafting, risk of  restenosis, local vascular complications, etc., and consented for  percutaneous coronary intervention.   PROCEDURE:  After obtaining  the informed consent the patient was brought  to the cath lab and was placed on fluoroscopy table.  The right groin  was prepped and draped in usual fashion.  I used 2% Xylocaine for local  anesthesia in the right groin.  With the help of thin-wall needle, 6-  French arterial sheath was placed.  The sheath was aspirated and  flushed.  Next, a 6-French left Judkins catheter was advanced over the  wire under fluoroscopic guidance to the ascending aorta.  Wire was  pulled out, the catheter was aspirated and connected to the manifold.  Catheter was further advanced and engaged into left coronary ostium.  Multiple views of the left system were taken.  Next, the catheter was  disengaged and was pulled out over the wire and was replaced with 6-  [place]  right Judkins catheter which was advanced over the wire under  fluoroscopic guidance up to the ascending aorta.  Wire was pulled out.  The catheter was aspirated and connected to the manifold.  Catheter was  further advanced and engaged into right coronary ostium.  Multiple views  of the right system were taken.  Next, the catheter was disengaged and  was pulled out over the wire and was replaced with 6-French pigtail  catheter which was advanced over the wire under fluoroscopic guidance up  to the ascending aorta.  Wire was pulled out, the catheter was aspirated  and connected to the manifold.  Catheter was further advanced across the  aortic valve into the LV.  LV pressures were recorded.  Next, left  ventriculography was done in 30 degree RAO position.  Post angiographic  pressures were recorded from LV and then pullback pressures were  recorded from the aorta.  There was no gradient across the aortic valve.  Next, the pigtail catheter was pulled out over the wire.  Sheaths were  aspirated and flushed.   FINDINGS:  LV showed mild anterolateral wall hypokinesia, EF of 50-55%.  Left main was patent.  LAD was patent proximally in the stented segment  and then has 30-35% distal edge restenosis.  LAD was diffusely diseased  beyond midportion after giving off diagonal-2.  Diagonal-1 was very  small which was patent.  Diagonal-2 was very small which has 50-60%  stenosis.  Vessel is less than 1.5 mm.  Ramus is very, very small.  Left  circumflex is large which has 5-10% stenosis.  OM-1 and OM-2 were less  than 0.5 mm.  OM-3 was moderate size which was patent.  OM-4 was small  which was patent.  RCA has 20-30% ostial stenosis and 40-50% sequential  mid stenosis and 20-30% distal stenosis.  PDA is patent.  PLV branches  are small which was patent.  The patient tolerated procedure well.  There were no complications.  The patient was transferred to recovery  room in stable condition.       Francisco Collier. Sharyn Collier, M.D.  Electronically Signed     MNH/MEDQ  D:  01/17/2008  T:  01/18/2008  Job:  [id number]   cc:   Cath lab

## 2011-04-29 NOTE — Discharge Summary (Signed)
NAME:  Francisco Collier, Francisco Collier               ACCOUNT NO.:  [id number]   MEDICAL RECORD NO.:  [id number]          PATIENT TYPE:  OBV   LOCATION:  5501                         FACILITY:  [facility]   PHYSICIAN:  Francisco Leigh. Collier, M.D.DATE OF BIRTH:  July 10, 1941   DATE OF ADMISSION:  12/29/2008  DATE OF DISCHARGE:  01/01/2009                               DISCHARGE SUMMARY   DISCHARGE DIAGNOSES:  1. Benign paroxysmal positional vertigo with history of emergency      department visit for vertigo in January 2008.  2. 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  3. Hypertension.  4. Hyperlipidemia.  5. History of myocardial infarction in 2005, status post stent of the      left anterior descending coronary artery with cardiac      catheterization in February 2009 showing ejection fraction 50-55%.  6. Gastroesophageal reflux disease.  7. Gastric ulcer, status post laparotomy.  8. Upper endoscopy in March 2003 with esophageal dilation.   DISCHARGE MEDICATIONS:  1. Aspirin 81 mg p.o. daily.  2. Plavix 75 mg p.o. daily.  3. Metoprolol (Toprol-XL) 50 mg p.o. daily.  4. Trilipix (fenofibrate) 145 mg p.o. daily.  5. Ramipril 2.5 mg p.o. daily.  6. Meclizine 25 mg p.o. q.6 h. p.r.n. dizziness.   DISCHARGE CONDITION AND FOLLOWUP:  The patient was discharged in stable  condition, his vertigo having subsided and workup for CVA or other life-  threatening causes of dizziness was negative.  He is to follow up with  Dr. Aida Collier at [facility] on January 06, 2009, at 11  a.m.  Items to address during this visit include compliance with his  medications, whether the meclizine has been helping with his vertigo,  and management of his blood pressure medications.   He is to follow up with Dr. Jenne Collier of [facility] on January 16, 2009,  at 1:30 p.m.  The purpose of this visit is further evaluation of  the  lesion in the right infratemporal fossa, which was discovered  radiographically and to assess whether or not this lesion could in any  way be contributing to the patient's vertigo.   PROCEDURES:  1. Noncontrast CT of the head dated December 29, 2008, showed      generalized atrophy, but no acute intracranial abnormalities.  2. MRI of the brain with and without contrast showed no acute      intracranial abnormality and a largely unremarkable MRI appearance      of the brain for age.  There was, however, a lobulated, transpatial      lesion in the right infratemporal fossa (right parotid space,      masticator space, and parapharyngeal space) was incompletely      visualized, but the visualized portions were stable since December 11, 2006.  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  A dedicated neck CT      with and without contrast or neck MRI with and without contrast      were recommended.  3. MRA of the head and neck showed mild bilateral internal carotid      artery siphon atherosclerosis, but otherwise negative intracranial      MR angiography.  A 2D echo dated December 31, 2008, showed overall      EF normal with possible septal hypokinesis with the ejection      fraction estimated at 55% and no evidence for cardiac source of      emboli.   CONSULTATIONS:  We discussed Francisco Collier's case with Dr. Jenne Collier of  [facility] who will see him as an outpatient.   BRIEF ADMITTING HISTORY AND PHYSICAL:  For full details, please see the  hospital chart but in brief, Francisco Collier is a 70 year old man with past  medical history outlined above who presented with acute onset of  dizziness that he awoke with at approximately 2:30 a.m. on the day of  admission.  He woke to stand and go to the bathroom and noted acute  dizziness, feeling hot and clammy as  well as nausea.  He reported  similar episodes in the past.  He felt that his head was spinning, which  was aggravated by turning his head and any movements.  It was alleviated  by standing completely still.  He also noted a right-sided headache for  about 2 weeks.  He denied fevers, chills, vomiting, abdominal pain,  diarrhea, focal neuro findings, neck stiffness, or hearing loss.  Of  note, he had been off his Plavix for about 6 days in preparation for a  steroid injection in his hip.   PHYSICAL EXAMINATION:  VITAL SIGNS:  Temperature 98.1, blood pressure  123/86, pulse 70, respirations 18, and oxygen saturation 97% on room  air.  GENERAL:  He was in no acute distress.  NECK:  No bruits.  He had no tenderness.  LUNGS:  Clear to auscultation bilaterally.  HEART:  Regular rate and rhythm and no murmurs with distant heart  sounds.  ABDOMEN:  Soft and nontender with bowel sounds present.  EXTREMITIES:  No edema.  He had no CVA tenderness.  NEUROLOGIC:  Significant for horizontal nystagmus, but was otherwise  nonfocal.   LABORATORY DATA:  Sodium 137, potassium 3.9, chloride 105, bicarb 23,  BUN 14, creatinine 1.2, glucose 98.  White blood count 6.3, hemoglobin  13.0, and platelets 280.   HOSPITAL COURSE:  1. Vertigo:  The patient was admitted for observation and in order to      rule out life-threatening causes of vertigo including stroke.  He      was evaluated by the PT for vestibular rehab.  He did have symptoms      during a modified Dix-Hallpike maneuver with 5/10 dizziness to the      left side and 2/10 dizziness to the right side with symptoms      lasting few within 10 seconds each.  He had horizontal nystagmus to      the left, all indicative of left BPPV.  He was familiar with the      Brandt-Daroff exercises from his January 2008 episode of vertigo      and he was provided with an instruction and with a handout.  He was      also given meclizine as needed for vertigo.  He  underwent      echocardiogram as well as MRI and MRA with the findings described      above.  The lesion in the right infratemporal fossa that was noted      incidentally was discussed both with Dr. Margo Aye of Radiology as well      as with Dr. Jenne Collier of ENT, both of whom felt that the lesion was      very unlikely to be contributing to the patient's vertigo and also      that it was most likely benign.  He is to follow up with Dr. Jenne Collier      of ENT as outlined above.  While he was in the hospital due to the      fact that he had missed his appointment for the steroid injection      and was being evaluated for possible stroke, his Plavix was      restarted.  Orthostatic vital signs were checked and were negative.   DISCHARGE LABORATORIES AND VITALS:  Temperature 98.3, pulse 77,  respirations 18, blood pressure 128/88, and oxygen saturation 97% on  room air.  Most recent BMET which was on December 30, 2008, sodium 140,  potassium 4.4, chloride 107, bicarb 27, glucose 88, BUN 12, creatinine  1.24, calcium 9.0.  White blood count 5.6, hemoglobin 11.8, and  platelets 237.      Loel Dubonnet, MD  Electronically Signed      Francisco Leigh. Ninetta Lights, M.D.  Electronically Signed    PN/MEDQ  D:  01/06/2009  T:  01/07/2009  Job:  [id number]   cc:   Mamie Levers, MD

## 2011-04-29 NOTE — Procedures (Signed)
[facility]  Patient:    Francisco Collier, Francisco Collier Visit Number: [id number] MRN: [id number]          Service Type: Attending:  Hedwig Morton. Juanda Chance, M.D. [facility] Dictated by:   Hedwig Morton. Juanda Chance, M.D. [facility]                             Procedure Report  PROCEDURE:  Upper endoscopy and esophageal dilatation.  INDICATIONS:  This 70 year old gentleman has a benign distal esophageal stricture.  He underwent upper endoscopy and dilatation in 1995, using Maloney dilators.  He has been on Zantac 160 mg a day, but in the last two years the dysphagia has become progressively worse, and he had several episodes of food impaction recently.  He also has had increasing heartburn not controlled on Zantac.  He is undergoing upper endoscopy and esophageal dilatation.  INSTRUMENT:  Olympus single-channel videoscope.  PREMEDICATION:  Versed 5 mg IV, Demerol 70 mg IV.  DESCRIPTION OF PROCEDURE:  The Olympus single-channel videoscope was passed routinely through the posterior pharynx into the esophagus.  The patient was monitored by pulse oximetry.  Oxygen saturations were satisfactory.  He was cooperative.  The proximal and mid esophageal mucosa were unremarkable.  There was a rather tight esophageal stricture of the GE junction around 40 cm from the incisors.  It initially did not allow the endoscope to travel through. The diameter of the scope was 12 mm.  There were short erosions of less than 5 mm in diameter.  There was friable mucosa.  As the endoscope pushed into the stomach there was some bleeding from the orifice of the stricture.  Stomach:  The stomach was insufflated with air and showed normal-appearing gastric mucosa, antrum, and pylorus.  Duodenum:  The duodenal bulb showed patchy erosions.  Biopsies were taken for CLOtest.  Endoscope was then brought back to the stomach, and biopsies were taken from the GE junction towards the area of the esophagitis to rule out Barretts  esophagus.  Guide wire was then placed through the endoscope, and the endoscope was retracted.  Over the guide wire a Savary dilator was passed using 12.8, 14, 15, 16, and 17 mm dilators.  There was a small amount of blood on each dilator.  The patient tolerated the procedure well.  IMPRESSION: 1. Benign distal esophageal stricture, status post dilatation to 63 [place]. 2. Grade 1 reflux esophagitis. 3. Duodenitis, status post CLOtest.  PLAN: 1. The patient is to continue antireflux measures. 2. Discontinue Zantac in favor of Nexium 40 mg a day. 3. The patient may need redilatation in the near future should he develop    recurrent stricture. Dictated by:   Hedwig Morton. Juanda Chance, M.D. [facility] Attending:  Hedwig Morton. Juanda Chance, M.D. [facility] DD:  02/20/02 TD:  02/21/02 Job: [id number]

## 2011-07-08 ENCOUNTER — Other Ambulatory Visit (HOSPITAL_COMMUNITY): Payer: Self-pay | Admitting: Cardiology

## 2011-07-21 ENCOUNTER — Encounter (HOSPITAL_COMMUNITY): Payer: Self-pay | Admitting: Radiology

## 2011-07-21 ENCOUNTER — Emergency Department (HOSPITAL_COMMUNITY): Payer: Medicare Other

## 2011-07-21 ENCOUNTER — Emergency Department (HOSPITAL_COMMUNITY)
Admission: EM | Admit: 2011-07-21 | Discharge: 2011-07-21 | Disposition: A | Discharge status: Home / Self Care | Payer: Medicare Other | Attending: Emergency Medicine | Admitting: Emergency Medicine

## 2011-07-21 DIAGNOSIS — I10 Essential (primary) hypertension: Secondary | ICD-10-CM | POA: Insufficient documentation

## 2011-07-21 DIAGNOSIS — Z79899 Other long term (current) drug therapy: Secondary | ICD-10-CM | POA: Insufficient documentation

## 2011-07-21 DIAGNOSIS — Z7982 Long term (current) use of aspirin: Secondary | ICD-10-CM | POA: Insufficient documentation

## 2011-07-21 DIAGNOSIS — I252 Old myocardial infarction: Secondary | ICD-10-CM | POA: Insufficient documentation

## 2011-07-21 DIAGNOSIS — N201 Calculus of ureter: Secondary | ICD-10-CM | POA: Insufficient documentation

## 2011-07-21 DIAGNOSIS — R109 Unspecified abdominal pain: Secondary | ICD-10-CM | POA: Insufficient documentation

## 2011-07-21 DIAGNOSIS — E7800 Pure hypercholesterolemia, unspecified: Secondary | ICD-10-CM | POA: Insufficient documentation

## 2011-07-21 HISTORY — DX: Acute myocardial infarction, unspecified: I21.9

## 2011-07-21 HISTORY — DX: Calculus of kidney: N20.0

## 2011-07-21 HISTORY — DX: Diverticulitis of intestine, part unspecified, without perforation or abscess without bleeding: K57.92

## 2011-07-21 HISTORY — DX: Benign prostatic hyperplasia without lower urinary tract symptoms: N40.0

## 2011-07-21 HISTORY — DX: Essential (primary) hypertension: I10

## 2011-07-21 HISTORY — DX: Acquired absence of other genital organ(s): Z90.79

## 2011-07-21 LAB — URINALYSIS, ROUTINE W REFLEX MICROSCOPIC
Glucose, UA: NEGATIVE mg/dL
Ketones, ur: NEGATIVE mg/dL
Leukocytes, UA: NEGATIVE
Protein, ur: NEGATIVE mg/dL
Urobilinogen, UA: 0.2 mg/dL (ref 0.0–1.0)

## 2011-07-24 ENCOUNTER — Emergency Department (HOSPITAL_COMMUNITY): Payer: Medicare Other

## 2011-07-24 ENCOUNTER — Inpatient Hospital Stay (HOSPITAL_COMMUNITY)
Admission: EM | Admit: 2011-07-24 | Discharge: 2011-07-26 | DRG: 313 | Disposition: A | Discharge status: Home / Self Care | Payer: Medicare Other | Attending: Cardiology | Admitting: Cardiology

## 2011-07-24 DIAGNOSIS — M1990 Unspecified osteoarthritis, unspecified site: Secondary | ICD-10-CM | POA: Diagnosis present

## 2011-07-24 DIAGNOSIS — I252 Old myocardial infarction: Secondary | ICD-10-CM

## 2011-07-24 DIAGNOSIS — E119 Type 2 diabetes mellitus without complications: Secondary | ICD-10-CM | POA: Diagnosis present

## 2011-07-24 DIAGNOSIS — D649 Anemia, unspecified: Secondary | ICD-10-CM | POA: Diagnosis present

## 2011-07-24 DIAGNOSIS — I2510 Atherosclerotic heart disease of native coronary artery without angina pectoris: Secondary | ICD-10-CM | POA: Diagnosis present

## 2011-07-24 DIAGNOSIS — I10 Essential (primary) hypertension: Secondary | ICD-10-CM | POA: Diagnosis present

## 2011-07-24 DIAGNOSIS — Z7982 Long term (current) use of aspirin: Secondary | ICD-10-CM

## 2011-07-24 DIAGNOSIS — Z79899 Other long term (current) drug therapy: Secondary | ICD-10-CM

## 2011-07-24 DIAGNOSIS — R0789 Other chest pain: Principal | ICD-10-CM | POA: Diagnosis present

## 2011-07-24 DIAGNOSIS — I209 Angina pectoris, unspecified: Secondary | ICD-10-CM | POA: Diagnosis present

## 2011-07-24 DIAGNOSIS — K219 Gastro-esophageal reflux disease without esophagitis: Secondary | ICD-10-CM | POA: Diagnosis present

## 2011-07-24 DIAGNOSIS — Z87891 Personal history of nicotine dependence: Secondary | ICD-10-CM

## 2011-07-24 DIAGNOSIS — N400 Enlarged prostate without lower urinary tract symptoms: Secondary | ICD-10-CM | POA: Diagnosis present

## 2011-07-24 LAB — COMPREHENSIVE METABOLIC PANEL
ALT: 100 U/L — ABNORMAL HIGH (ref 0–53)
AST: 137 U/L — ABNORMAL HIGH (ref 0–37)
CO2: 26 mEq/L (ref 19–32)
Chloride: 104 mEq/L (ref 96–112)
Creatinine, Ser: 1.1 mg/dL (ref 0.50–1.35)
GFR calc non Af Amer: >60 mL/min (ref >60)
Total Bilirubin: 1 mg/dL (ref 0.3–1.2)

## 2011-07-24 LAB — CBC
MCH: 23.8 pg — ABNORMAL LOW (ref 26.0–34.0)
MCHC: 31.7 g/dL (ref 30.0–36.0)
MCV: 75.1 fL — ABNORMAL LOW (ref 78.0–100.0)
Platelets: 300 10*3/uL (ref 150–400)
RBC: 4.45 MIL/uL (ref 4.22–5.81)
RDW: 15.9 % — ABNORMAL HIGH (ref 11.5–15.5)

## 2011-07-24 LAB — DIFFERENTIAL
Eosinophils Absolute: 0.1 10*3/uL (ref 0.0–0.7)
Eosinophils Relative: 1 % (ref 0–5)
Lymphs Abs: 1.5 10*3/uL (ref 0.7–4.0)
Monocytes Absolute: 0.6 10*3/uL (ref 0.1–1.0)
Monocytes Relative: 9 % (ref 3–12)

## 2011-07-24 LAB — POCT I-STAT, CHEM 8
BUN: 16 mg/dL (ref 6–23)
Calcium, Ion: 1.22 mmol/L (ref 1.12–1.32)
TCO2: 24 mmol/L (ref 0–100)

## 2011-07-24 LAB — POCT I-STAT TROPONIN I
Troponin i, poc: 0 ng/mL (ref 0.00–0.08)
Troponin i, poc: 0 ng/mL (ref 0.00–0.08)

## 2011-07-24 LAB — CARDIAC PANEL(CRET KIN+CKTOT+MB+TROPI)
CK, MB: 1.3 ng/mL (ref 0.3–4.0)
Relative Index: INVALID (ref 0.0–2.5)
Troponin I: <0.3 ng/mL (ref <0.30)

## 2011-07-24 LAB — CK TOTAL AND CKMB (NOT AT ARMC): Relative Index: INVALID (ref 0.0–2.5)

## 2011-07-25 ENCOUNTER — Inpatient Hospital Stay (HOSPITAL_COMMUNITY): Payer: No Typology Code available for payment source

## 2011-07-25 ENCOUNTER — Inpatient Hospital Stay (HOSPITAL_COMMUNITY): Payer: Medicare Other

## 2011-07-25 LAB — IRON AND TIBC
Iron: 52 ug/dL (ref 42–135)
TIBC: 430 ug/dL (ref 215–435)

## 2011-07-25 LAB — DIFFERENTIAL
Basophils Relative: 0 % (ref 0–1)
Eosinophils Absolute: 0.1 10*3/uL (ref 0.0–0.7)
Eosinophils Relative: 2 % (ref 0–5)
Monocytes Absolute: 0.5 10*3/uL (ref 0.1–1.0)
Monocytes Relative: 11 % (ref 3–12)

## 2011-07-25 LAB — FERRITIN: Ferritin: 17 ng/mL — ABNORMAL LOW (ref 22–322)

## 2011-07-25 LAB — CBC
MCH: 23.8 pg — ABNORMAL LOW (ref 26.0–34.0)
MCHC: 31.7 g/dL (ref 30.0–36.0)
Platelets: 278 10*3/uL (ref 150–400)

## 2011-07-25 LAB — LIPID PANEL
Total CHOL/HDL Ratio: 4.4 RATIO
VLDL: 20 mg/dL (ref 0–40)

## 2011-07-25 LAB — HEPARIN LEVEL (UNFRACTIONATED)
Heparin Unfractionated: 0.27 IU/mL — ABNORMAL LOW (ref 0.30–0.70)
Heparin Unfractionated: 0.37 IU/mL (ref 0.30–0.70)

## 2011-07-25 LAB — CARDIAC PANEL(CRET KIN+CKTOT+MB+TROPI): Total CK: 56 U/L (ref 7–232)

## 2011-07-25 LAB — APTT: aPTT: 36 seconds (ref 24–37)

## 2011-07-25 MED ORDER — TECHNETIUM TC 99M TETROFOSMIN IV KIT
30.0000 | PACK | Freq: Once | INTRAVENOUS | Status: AC | PRN
  Administered 2011-07-25: 30 via INTRAVENOUS

## 2011-07-25 MED ORDER — TECHNETIUM TC 99M TETROFOSMIN IV KIT
10.0000 | PACK | Freq: Once | INTRAVENOUS | Status: AC | PRN
  Administered 2011-07-25: 10 via INTRAVENOUS

## 2011-07-26 ENCOUNTER — Telehealth: Payer: Self-pay | Admitting: Gastroenterology

## 2011-07-26 LAB — CBC
HCT: 33.6 % — ABNORMAL LOW (ref 39.0–52.0)
Hemoglobin: 10.6 g/dL — ABNORMAL LOW (ref 13.0–17.0)
Platelets: 296 10*3/uL (ref 150–400)
RDW: 16.1 % — ABNORMAL HIGH (ref 11.5–15.5)
WBC: 4.7 10*3/uL (ref 4.0–10.5)

## 2011-07-26 LAB — DIFFERENTIAL
Basophils Absolute: 0 10*3/uL (ref 0.0–0.1)
Lymphocytes Relative: 28 % (ref 12–46)
Neutro Abs: 2.8 10*3/uL (ref 1.7–7.7)
Neutrophils Relative %: 59 % (ref 43–77)

## 2011-07-26 NOTE — Telephone Encounter (Signed)
Pts wife states pt was just in the hospital for chest/epigastric pain. He had a stress test and work up with his cardiologist and he had not cardiac problems. Wife states the cardiologist suggested he see his GI doc for an upper-gi. [name] do you want this pt scheduled for an OV first? Please advise.

## 2011-07-26 NOTE — Telephone Encounter (Signed)
yes

## 2011-07-27 NOTE — Telephone Encounter (Signed)
Appt made for pt to see Amy Esterwood PA 07/29/11@9am . Pt aware of appt date and time.

## 2011-07-28 NOTE — Discharge Summary (Signed)
NAME:  Francisco Collier, Francisco Collier               ACCOUNT NO.:  [id number]  MEDICAL RECORD NO.:  [id number]  LOCATION:  3738                         FACILITY:  [facility]  PHYSICIAN:  Johnell Bas N. Sharyn Lull, M.D. DATE OF BIRTH:  July 16, 1941  DATE OF ADMISSION:  07/24/2011 DATE OF DISCHARGE:  07/26/2011                              DISCHARGE SUMMARY   ADMITTING DIAGNOSES: 1. Recurrent chest pain rule out myocardial infarction. 2. Coronary artery disease. 3. History of anteroseptal wall myocardial infarction in the past. 4. Hypertension. 5. 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 6. Hypercholesteremia. 7. Remote tobacco abuse. 8. Abdominal pain, rule out gastritis, rule out peptic ulcer disease. 9. Gastroesophageal reflux disease. 10.Degenerative joint disease. 11.Chronic anemia.  DISCHARGE DIAGNOSES: 1. Status post chest pain.  Myocardial infarction ruled out, negative     per Persantine Myoview. 2. Coronary artery disease. 3. History of anteroseptal wall myocardial infarction in the past. 4. Stable angina. 5. Hypertension. 6. Non-insulin dependent diabetes mellitus controlled by diet. 7. Hypercholesteremia. 8. Gastroesophageal reflux disease. 9. Remote tobacco abuse. 10.Status post abdominal pain. 11.Degenerative joint disease. 12.Chronic anemia, stable.  DISCHARGE HOME MEDICATIONS: 1. Enteric-coated aspirin 81 mg 1 tablet daily. 2. Nexium 40 mg 1 capsule daily. 3. Carafate 1 g q.6 h. 4. Lisinopril 10 mg 1 tablet daily. 5. Toprol-XL 50 mg 1 tablet daily. 6. Trilipix 135 mg 1 tablet daily.  DIET:  Low salt, low cholesterol.  The patient has been advised to avoid spicy food.  Avoid alcohol and excessive caffeine use.  Follow up with me in 1 week.  Increase activity slowly as tolerated. The patient also has been advised to refrain from sweets diet.  Low salt, low cholesterol, heart healthy 1800 calories ADA diet.  Follow up with me in 1 week and GI in 2  weeks.  CONDITION AT DISCHARGE:  Stable.  BRIEF HISTORY AND HOSPITAL COURSE:  Mr. Reali is 70 year old white male with past medical history significant for coronary artery disease, history of anteroseptal wall MI in 2006, hypertension, non-insulin- dependent diabetes mellitus controlled by diet, hypercholesteremia, history of peptic ulcer disease, history of lower GI bleed secondary to colonic polyp in the past, benign hypertrophy of prostate, GERD, degenerative joint disease.  He came to the ER complaining of recurrent retrosternal chest pain and epigastric pain associated with nausea and diaphoresis while at church.  States has been having chest pain and epigastric pain off and on since Wednesday.  Seen in the ER on Thursday, workup was negative and was sent home.  The patient states chest pain feels similar in nature when he had MI.  Denies any palpitation, lightheadedness or syncope.  Denies PND, orthopnea or leg swelling. Denies relation of chest pain to food, breathing, or movement.  PAST MEDICAL HISTORY:  As above.  PAST SURGICAL HISTORY:  He had back surgery in February 2012, had right subtotal thyroidectomy in August 2011, had right inguinal orchiectomy in May 2010, had laparotomy for peptic ulcer disease many years ago.  ALLERGIES:  NIASPAN AND STATIN, he has intolerance.  SOCIAL HISTORY:  He is married, retired.  Smoked half pack per day for 30 years, quit 13 years ago.  No history of alcohol abuse.  FAMILY HISTORY:  Positive for coronary artery disease.  MEDICATION AT HOME:  He was on 1. Enteric-coated aspirin 81 mg p.o. daily. 2. Toprol-XL 50 mg p.o. daily. 3. Lisinopril 10 mg p.o. daily. 4. Trilipix 135 mg p.o. daily.  PHYSICAL EXAMINATION:  GENERAL:  He is alert, awake, and oriented x3, in no acute distress. VITAL SIGNS:  Blood pressure was 105/76, pulse was 77, afebrile. HEENT:  Conjunctiva was pink. NECK:  Supple, no JVD, no bruit. LUNGS:  Clear to  auscultation without rhonchi or rales. CARDIOVASCULAR:  S1 and S2 was normal.  There was soft systolic murmur. There was no S3 gallop. ABDOMEN:  Soft.  Bowel sounds are present, nontender. EXTREMITIES:  There is no clubbing, cyanosis or edema.  LABORATORY DATA:  Hemoglobin was 10.6, hematocrit 33.4, white count of 6.3, his MCV was also low at 75.1.  His sodium was 138, potassium 3.9, BUN 16, creatinine 1.10, glucose 121.  His lipase was normal 21. Two sets of cardiac enzymes were normal.  Cholesterol was 137, LDL 86, HDL 31, triglycerides were 98.  Persantine Myoview showed focal scarring involving anteroseptal wall approaching the apex.  No evidence of myocardial ischemia and mild hypokinesia of the anteroseptal wall, EF of 57%.  The patient also had abdominal ultrasound which showed cholelithiasis without acute cholecystitis.  Chest x-ray showed no acute disease.  BRIEF HOSPITAL COURSE:  The patient was admitted to telemetry unit.  MI was ruled out by serial enzymes and EKG.  The patient subsequently underwent an Persantine Myoview as above which showed no evidence of reversible ischemia with EF of 57%.  The patient did not had any further episodes of chest pain during the hospital stay, although he continued to have epigastric pain, was started on Carafate with almost resolution of his abdominal pain.  The patient will be discharged home on above medications and will be followed up in my office in 1 week and GI in 2 weeks.  The patient states he had multiple endoscopies in the past and will see GI as outpatient.     Eduardo Osier. Sharyn Lull, M.D.     MNH/MEDQ  D:  07/26/2011  T:  07/26/2011  Job:  [id number]  Electronically Signed by Rinaldo Cloud M.D. on 07/28/2011 08:18:03 PM

## 2011-07-29 ENCOUNTER — Other Ambulatory Visit (INDEPENDENT_AMBULATORY_CARE_PROVIDER_SITE_OTHER): Payer: Medicare Other

## 2011-07-29 ENCOUNTER — Encounter: Payer: Self-pay | Admitting: Physician Assistant

## 2011-07-29 ENCOUNTER — Inpatient Hospital Stay (HOSPITAL_COMMUNITY): Payer: Medicare Other

## 2011-07-29 ENCOUNTER — Inpatient Hospital Stay (HOSPITAL_COMMUNITY)
Admission: AD | Admit: 2011-07-29 | Discharge: 2011-07-30 | DRG: 418 | Disposition: A | Discharge status: Home / Self Care | Payer: Medicare Other | Source: Ambulatory Visit | Attending: General Surgery | Admitting: General Surgery

## 2011-07-29 ENCOUNTER — Other Ambulatory Visit (INDEPENDENT_AMBULATORY_CARE_PROVIDER_SITE_OTHER): Payer: Self-pay | Admitting: Surgery

## 2011-07-29 ENCOUNTER — Ambulatory Visit (INDEPENDENT_AMBULATORY_CARE_PROVIDER_SITE_OTHER): Payer: Medicare Other | Admitting: Physician Assistant

## 2011-07-29 VITALS — BP 128/82 | HR 60 | Ht 73.0 in | Wt 227.0 lb

## 2011-07-29 DIAGNOSIS — K8050 Calculus of bile duct without cholangitis or cholecystitis without obstruction: Secondary | ICD-10-CM

## 2011-07-29 DIAGNOSIS — K8010 Calculus of gallbladder with chronic cholecystitis without obstruction: Principal | ICD-10-CM | POA: Diagnosis present

## 2011-07-29 DIAGNOSIS — Z87891 Personal history of nicotine dependence: Secondary | ICD-10-CM

## 2011-07-29 DIAGNOSIS — K222 Esophageal obstruction: Secondary | ICD-10-CM | POA: Insufficient documentation

## 2011-07-29 DIAGNOSIS — K219 Gastro-esophageal reflux disease without esophagitis: Secondary | ICD-10-CM

## 2011-07-29 DIAGNOSIS — R7989 Other specified abnormal findings of blood chemistry: Secondary | ICD-10-CM

## 2011-07-29 DIAGNOSIS — R1013 Epigastric pain: Secondary | ICD-10-CM

## 2011-07-29 DIAGNOSIS — Z79899 Other long term (current) drug therapy: Secondary | ICD-10-CM

## 2011-07-29 DIAGNOSIS — Z860100 Personal history of colon polyps, unspecified: Secondary | ICD-10-CM

## 2011-07-29 DIAGNOSIS — K8020 Calculus of gallbladder without cholecystitis without obstruction: Secondary | ICD-10-CM

## 2011-07-29 DIAGNOSIS — Z87442 Personal history of urinary calculi: Secondary | ICD-10-CM

## 2011-07-29 DIAGNOSIS — K5790 Diverticulosis of intestine, part unspecified, without perforation or abscess without bleeding: Secondary | ICD-10-CM | POA: Insufficient documentation

## 2011-07-29 DIAGNOSIS — K660 Peritoneal adhesions (postprocedural) (postinfection): Secondary | ICD-10-CM

## 2011-07-29 DIAGNOSIS — Z9861 Coronary angioplasty status: Secondary | ICD-10-CM

## 2011-07-29 DIAGNOSIS — I10 Essential (primary) hypertension: Secondary | ICD-10-CM | POA: Diagnosis present

## 2011-07-29 DIAGNOSIS — M1990 Unspecified osteoarthritis, unspecified site: Secondary | ICD-10-CM | POA: Diagnosis present

## 2011-07-29 DIAGNOSIS — I252 Old myocardial infarction: Secondary | ICD-10-CM

## 2011-07-29 DIAGNOSIS — E785 Hyperlipidemia, unspecified: Secondary | ICD-10-CM | POA: Diagnosis present

## 2011-07-29 DIAGNOSIS — Z7982 Long term (current) use of aspirin: Secondary | ICD-10-CM

## 2011-07-29 DIAGNOSIS — K5730 Diverticulosis of large intestine without perforation or abscess without bleeding: Secondary | ICD-10-CM

## 2011-07-29 DIAGNOSIS — K565 Intestinal adhesions [bands], unspecified as to partial versus complete obstruction: Secondary | ICD-10-CM | POA: Diagnosis present

## 2011-07-29 LAB — CBC WITH DIFFERENTIAL/PLATELET
Basophils Absolute: 0 10*3/uL (ref 0.0–0.1)
HCT: 35.4 % — ABNORMAL LOW (ref 39.0–52.0)
Lymphs Abs: 1.6 10*3/uL (ref 0.7–4.0)
Monocytes Absolute: 0.5 10*3/uL (ref 0.1–1.0)
Monocytes Relative: 8.7 % (ref 3.0–12.0)
Neutrophils Relative %: 62 % (ref 43.0–77.0)
Platelets: 302 10*3/uL (ref 150.0–400.0)
RDW: 17.8 % — ABNORMAL HIGH (ref 11.5–14.6)
WBC: 5.9 10*3/uL (ref 4.5–10.5)

## 2011-07-29 LAB — HEPATIC FUNCTION PANEL
Albumin: 3.7 g/dL (ref 3.5–5.2)
Alkaline Phosphatase: 77 U/L (ref 39–117)
Total Bilirubin: 0.7 mg/dL (ref 0.3–1.2)

## 2011-07-29 LAB — BASIC METABOLIC PANEL
CO2: 26 mEq/L (ref 19–32)
Chloride: 102 mEq/L (ref 96–112)
Glucose, Bld: 97 mg/dL (ref 70–99)
Sodium: 136 mEq/L (ref 135–145)

## 2011-07-29 LAB — SURGICAL PCR SCREEN
MRSA, PCR: NEGATIVE
Staphylococcus aureus: NEGATIVE

## 2011-07-29 LAB — LIPASE: Lipase: 26 U/L (ref 11.0–59.0)

## 2011-07-29 NOTE — Progress Notes (Signed)
I agree...drp

## 2011-07-29 NOTE — Progress Notes (Signed)
Subjective:    Patient ID: Francisco Collier, male    DOB: [birth year], 70 y.o.   MRN: [id number]  HPI Francisco Collier is a very nice 70 year old white male known to 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 He also has history of coronary artery disease and prior MI.  Patient comes in today as an acute work in with complaints of a one-week history of epigastric pain. He says he had some mild pain the week previous but went to church this past Sunday and began having significant epigastric and lower chest pain which lasted for several minutes. It was not accompanied by shortness of breath or diaphoresis. 911 was called and he was taken to the emergency room at home and then admitted by Dr. Raenette Rover who is his cardiologist. He ruled out for an MI and had a Persantine Myoview done which was also negative. He was discharged to home with instructions to followup with GI. Patient at states today that he did ask for a GI consult while he was in the hospital. Since discharge she has continued to have constant "aggravating" pain and pressure in the lower chest without radiation. He says this is somewhat worse postprandially and he is also had vague nausea but no vomiting fever or chills. His bowel movements have been normal no melena or hematochezia. He has not had any nighttime symptoms and has no current complaints of dysphagia or odynophagia.  On review of hospital labs it is noted that he did have a mildly elevated transaminases with an SGOT of 137 SGPT of 100 alkaline phosphatase was normal as was his bilirubin. His hemoglobin was 10.6 hematocrit of 33.4 WBC of 6.3 and his MCV was low at 75. He also had upper abdominal ultrasound done through the emergency room which showed multiple gallstones, though no wall thickening and a normal common bile duct.  Noncontrasted CT of the abdomen and pelvis was also done which was negative with the exception of cholelithiasis. The patient says he has been uncomfortable  and hurting all week,and says he needs something done.    Review of Systems  Constitutional: Positive for appetite change.  HENT: Negative.   Eyes: Negative.   Respiratory: Negative.   Cardiovascular: Negative.   Gastrointestinal: Positive for nausea and abdominal pain.  Genitourinary: Negative.   Musculoskeletal: Negative.   Neurological: Negative.   Hematological: Negative.   Psychiatric/Behavioral: Negative.    Outpatient Encounter Prescriptions as of 07/29/2011  Medication Sig Dispense Refill  . aspirin 81 MG tablet Take 81 mg by mouth daily. Take 1 daily       . Choline Fenofibrate 135 MG capsule Take 135 mg by mouth daily.        Marland Kitchen esomeprazole (NEXIUM) 40 MG capsule Take 40 mg by mouth daily before breakfast.        . lisinopril (PRINIVIL,ZESTRIL) 10 MG tablet Take 10 mg by mouth daily. 1 tab daily.       . metoprolol (TOPROL-XL) 50 MG 24 hr tablet Take 50 mg by mouth daily.         Allergies  Allergen Reactions  . Niaspan (Niacin (Antihyperlipidemic))        Objective:   Physical Exam Well-developed white male in no acute distress, alert and oriented x3, accompanied by his wife HEENT; nontraumatic normocephalic EOMI PERRLA sclera anicteric Neck; is supple no  JVD, Cardiovascular; regular rate and rhythm with S1-S2 no murmur rub or gallop, Pulmonary; clear bilaterally, Abdomen; large soft tender in the epigastrium and right upper quadrant, bowel sounds present, no rebound, he does have some early guarding, no palpable mass or hepatosplenomegaly, Rectal; Brown stool trace, Hemoccult-positive, skin benign without lesion warm and dry psych; mood and affect normal and appropriate.        Assessment & Plan:  #79 70 year old male with 6 day history of constant epigastric and lower chest pain without radiation. Status post  admission to [facility] 8/12 through 8/14 and ruled out for MI with negative Persantine Myoview. Suspect his current pain is secondary to symptomatic cholelithiasis and/or cholecystitis  Plan; Will check CBC and hepatic panel now Have contacted Gen. surgery and have spoken to [name]   who recommends patient be a direct admission to the [facility]  service for further workup and probable laparoscopic cholecystectomy.   #2 History diverticulosis and previous diverticulitis  #3 coronary artery disease status post MI  #4 History of low-grade neuroendocrine tumor and sigmoid colon polyp 2011  #5 Chronic GERD with history of distal esophageal stricture due to Continue Nexium 40 mg once daily

## 2011-08-02 NOTE — Op Note (Signed)
NAME:  Francisco Collier, Francisco Collier               ACCOUNT NO.:  [id number]  MEDICAL RECORD NO.:  [id number]  LOCATION:  1538                         FACILITY:  [facility]  PHYSICIAN:  Ardeth Sportsman, MD     DATE OF BIRTH:  September 05, 1941  DATE OF PROCEDURE:  07/29/2011 DATE OF DISCHARGE:                              OPERATIVE REPORT   PRIMARY CARE PHYSICIAN:  Aida Puffer, MD  GASTROENTEROLOGIST:  Barbette Hair. Arlyce Dice, MD, [facility].  SURGEON:  Ardeth Sportsman, MD  ASSISTANT:  RN  PREOPERATIVE DIAGNOSIS:  Right upper quadrant pain with gallstones probable chronic cholecystitis.  POSTOPERATIVE DIAGNOSES: 1. Right upper quadrant pain with gallstones probable chronic     cholecystitis. 2. Cholecystolithiasis. 3. Moderate adhesions with some small bowel kinks, considering for     possible intermittent small bowel obstruction.  PROCEDURE PERFORMED: 1. Laparoscopic lysis of adhesions time 45 minutes. 2. Laparoscopic cholecystectomy with intraoperative cholangiogram.  ANESTHESIA: 1. General anesthesia. 2. Local anesthetic in a field block on all port sites.  SPECIMENS:  Gallbladder.  DRAINS:  None.  ESTIMATED BLOOD LOSS:  Minimal.  COMPLICATIONS:  None.  INDICATIONS:  Mr. Johnson is a 70 year old gentleman who has had intermittent abdominal pain for several years.  It is sometimes in the left abdomen and left lower quadrant.  More recently, he has had pain in his right upper quadrant.  Initially, I had a chance to get history out of him, but it seems to be related that if he has gravy or greasy foods that set it off.  He will have some fullness and feels like he needs to vomit as well, but no true emesis.  He does have a history of gastroesophageal reflux disease and esophageal strictures, status post an EGD and dilation in 2010.  Other symptoms were well controlled on Nexium and do not seem to consistent with that.  He denied having any history of constipation or  diarrhea.  He had a colonoscopy done in December 2011 that showed a few small polyps, but otherwise was underwhelming.  He did not have any sick contacts or travel history.  No inflammatory or bowel disease or irritable bowel syndrome and no heavy alcohol consumption.  No history of hepatitis or pancreatitis in the past.  No history of fall or trauma.  He has had a prior abdominal surgery for an appendectomy, although he has an upper midline incision.  He does have gallstones and have it repaired.  The anatomy and physiology of hepatobiliary and pancreatic function were discussed. Pathophysiology of gallstones with their natural history risks were discussed.  Given the fact that he has had intermittent pain, especially more severe that not really relented over this past week and seemed to be set up with greasy foods at least one or two times and the differential diagnosis is less likely; I think he would benefit from cholecystectomy.  Technique of laparoscopic was discussed included, questions answered.  Risks, benefits, and alternatives were discussed. He and his wife expressed understanding and agreed to proceed.  OPERATIVE FINDINGS:  He had a moderate volume of upper abdominal adhesions, primarily of greater omentum, but also some small bowel, especially near the apex of  the falciform ligament.  His cholangiogram otherwise looked normal.  He had a rather intrahepatic gallbladder with a friable liver.  No node, no fatty steatohepatosis/fatty change in liver.  DESCRIPTION OF PROCEDURE:  Informed consent was confirmed.  The patient had received IV cefazolin prior to incision.  He underwent general anesthesia without any difficulty.  He was positioned supine with arms tucked.  He had sequential compression devices active during the entire case.  His abdomen was clipped, prepped and draped in a sterile fashion. Surgical time-out confirmed our plan.  I placed a #5 mm port just superior  to the umbilicus using a modified cutdown Hasson technique. Entry was clean.  Carbon-dioxide insufflation was easy.  On  camera inspection, I encountered greater omentum through the upper two-third circumference.  I redirected the camera inferiorly towards the pelvis and came around and found moderate sloppy omentum covering most of the upper abdomen.  However, there was a window on the right mid abdomen, I placed a 5 mm port there.  Using that, I could see up into epigastric regions and I could find a window there and I placed a 10 mm port there. I switched the camera over to the right upper quadrant and in the right mid abdomen and  alternating.  With that, I used sharp dissection as well focused blunt dissection to help free the greater omentum off the anterior abdominal wall.  As I came around, I found a swath of the small bowel up in the supraumbilical region near close to the midline.  There was some sharp kinking in a couple of areas.  Seemed to be wrapped within some omentum.  I freed enough to place a 5 mm port in the right upper quadrant.  I decided to focus on the gallbladder first.  I found the fundus of the gallbladder laid cephalad.  I was able to free some omentum off the gallbladder and found the infundibulum pretty easily.  I scored the peritoneum off the gallbladder on the anteromedial and posterior lateral wall.  I was able to find the anterior branch of the cystic artery coming up on to the infundibulum anteriorly.  I isolated and skeletonized in order to get a 360 degree view.  I ligated using ultrasonic dissection.  I freed the proximal half of the gallbladder through the liver bed to get a good critical view.  I freed out some wispy attachments in the posterior lateral side including a wispy posterior cystic arterial branch.  With that, I could freed out the infundibulum and find the cystic duct.  I skeletonized that a few centimeters.  I placed a clip on the  infundibulum and did a partial cystic ductotomy.  I placed a #5 [place] cholangiocatheter through the puncture site in subcostal region and into the cystic duct.  I ran a cholangiogram using dilute radio-opaque contrast and continuous fluoroscopy.  Contrast flowed from a long mildly helical site, consistent with cystic duct cannulization.  Contrast easily flowed up to common hepatic duct into the right and left intrahepatic change.  It was rather long.  Contrast did slowly flow down the common bile duct. Initially on the first view, I could not see too well as I think there was some contrast from the prior CAT scan, but I had to reposition in lowering and positioning the fluoroscopy. I could find the ampulla and see contrast easily flushing across the normal ampulla into the duodenum.  I saw no stones or other abnormalities.  It was  consistent with normal cholangiogram.  We removed the cholangiogram catheter.  I placed 4 clips in the cystic duct and completed cystic duct transection off the infundibulum.  I freed the gallbladder from its main attachments on the liver bed.  It was rather intrahepatic.  There was a little bit of venous bleeder on the gallbladder fossa up near the little more anteriorly.  However, I isolated with spatula-tipped cautery well.  I did breach into the posterior wall of the gallbladder and trimmed it off as there was very intrahepatic.  There was some bile stranding up in that region up near mildly tip of the branch on the gallbladder fossa and I placed several clips around that.  I could not prove that there was any bile leaking after doing meticulous inspection, but I saw clips on it be safe since I was well away from the porta hepatis.  I placed the gallbladder in an EndoCatch bag given the leaking bile.  I placed out the subxiphoid wound after generally dilating it up.  I returned the port.  I did copious irrigation and ensured good hemostasis on liver bed  after irrigating several liters.  Clips were intact on the gallbladder in the gallbladder fossa as well as the porta hepatis.  There was no leak of bile or blood.  I turned the camera to enter the right upper quadrant and looked in the right mid abdomen.  I freed the greater omentum further off up towards the falciform ligament.  I found some of the small bowel loops and did some interloop lysis of adhesions.  There was a dense band that was sort of constricting a couple of bands of small bowel with tight corners and once I freed that up that straightened off well.  I did very careful inspection and saw no evidence of any serosal or enterotomy damage.  The bowel looked healthy and viable.  The omentum had a small breach within it, but I had a wide open one and it laid well.  Hemostasis was excellent.  I reinspected the gallbladder fossa and the sites were good at the liver as well.  I evacuated carbon dioxide with ports.  I closed the subxiphoid fascia using 0 Vicryl stitch.  I closed the skin with Monocryl stitch.  The patient was extubated and taken to recovery room in stable condition.  I discussed postop care with the patient and his wife prior to surgery.  I have discussed with her again.     Ardeth Sportsman, MD     SCG/MEDQ  D:  07/29/2011  T:  07/30/2011  Job:  [id number]  cc:   Aida Puffer, MD Fax: [phone]  Eduardo Osier. Harwani, M.D.Fax: [phone]  Barbette Hair. Arlyce Dice, MD,FACG [address] [place] [address]  Electronically Signed by Karie Soda MD on 08/02/2011 01:56:00 PM

## 2011-08-09 ENCOUNTER — Emergency Department (HOSPITAL_COMMUNITY): Payer: Medicare Other

## 2011-08-09 ENCOUNTER — Emergency Department (HOSPITAL_COMMUNITY)
Admission: EM | Admit: 2011-08-09 | Discharge: 2011-08-10 | Disposition: A | Discharge status: Home / Self Care | Payer: Medicare Other | Attending: Emergency Medicine | Admitting: Emergency Medicine

## 2011-08-09 DIAGNOSIS — E7800 Pure hypercholesterolemia, unspecified: Secondary | ICD-10-CM | POA: Insufficient documentation

## 2011-08-09 DIAGNOSIS — R112 Nausea with vomiting, unspecified: Secondary | ICD-10-CM | POA: Insufficient documentation

## 2011-08-09 DIAGNOSIS — I10 Essential (primary) hypertension: Secondary | ICD-10-CM | POA: Insufficient documentation

## 2011-08-09 DIAGNOSIS — I252 Old myocardial infarction: Secondary | ICD-10-CM | POA: Insufficient documentation

## 2011-08-09 DIAGNOSIS — N201 Calculus of ureter: Secondary | ICD-10-CM | POA: Insufficient documentation

## 2011-08-09 DIAGNOSIS — Z79899 Other long term (current) drug therapy: Secondary | ICD-10-CM | POA: Insufficient documentation

## 2011-08-09 DIAGNOSIS — N289 Disorder of kidney and ureter, unspecified: Secondary | ICD-10-CM | POA: Insufficient documentation

## 2011-08-09 DIAGNOSIS — Z7982 Long term (current) use of aspirin: Secondary | ICD-10-CM | POA: Insufficient documentation

## 2011-08-09 DIAGNOSIS — N1330 Unspecified hydronephrosis: Secondary | ICD-10-CM | POA: Insufficient documentation

## 2011-08-09 DIAGNOSIS — R109 Unspecified abdominal pain: Secondary | ICD-10-CM | POA: Insufficient documentation

## 2011-08-09 LAB — DIFFERENTIAL
Lymphocytes Relative: 12 % (ref 12–46)
Lymphs Abs: 1.3 10*3/uL (ref 0.7–4.0)
Neutro Abs: 9.3 10*3/uL — ABNORMAL HIGH (ref 1.7–7.7)
Neutrophils Relative %: 83 % — ABNORMAL HIGH (ref 43–77)

## 2011-08-09 LAB — CBC
HCT: 35.7 % — ABNORMAL LOW (ref 39.0–52.0)
Hemoglobin: 11.5 g/dL — ABNORMAL LOW (ref 13.0–17.0)
MCV: 74.8 fL — ABNORMAL LOW (ref 78.0–100.0)
Platelets: 391 10*3/uL (ref 150–400)
RBC: 4.77 MIL/uL (ref 4.22–5.81)
WBC: 11.3 10*3/uL — ABNORMAL HIGH (ref 4.0–10.5)

## 2011-08-09 LAB — POCT I-STAT, CHEM 8
Chloride: 104 mEq/L (ref 96–112)
HCT: 38 % — ABNORMAL LOW (ref 39.0–52.0)
Potassium: 4.4 mEq/L (ref 3.5–5.1)

## 2011-08-09 LAB — URINALYSIS, ROUTINE W REFLEX MICROSCOPIC
Nitrite: NEGATIVE
Specific Gravity, Urine: 1.026 (ref 1.005–1.030)
pH: 5 (ref 5.0–8.0)

## 2011-08-09 LAB — URINE MICROSCOPIC-ADD ON

## 2011-08-10 MED ORDER — IOHEXOL 300 MG/ML  SOLN
100.0000 mL | Freq: Once | INTRAMUSCULAR | Status: AC | PRN
  Administered 2011-08-10: 80 mL via INTRAVENOUS

## 2011-08-11 NOTE — H&P (Signed)
NAME:  Francisco Collier, Francisco Collier               ACCOUNT NO.:  [id number]  MEDICAL RECORD NO.:  [id number]  LOCATION:  1538                         FACILITY:  [facility]  PHYSICIAN:  Ardeth Sportsman, MD     DATE OF BIRTH:  1941-02-17  DATE OF ADMISSION:  07/29/2011 DATE OF DISCHARGE:                             HISTORY & PHYSICAL   REQUESTING PHYSICIAN:  [name].  PRIMARY CARE:  Aida Puffer, M.D.  CARDIOLOGISTEduardo Osier Sharyn Lull, M.D.  CHIEF COMPLAINT:  Abdominal pain.  BRIEF HISTORY:  The patient is a 70 year old white male who was seen on May 21, 2011, with abdominal pain.  CT scan showed some left ureteral calculus, which was nonobstructive.  He was discharged home.  He came back on July 24, 2011, again with pain which he describes in the midepigastric and the right side.  He was admitted by [name] to rule out of myocardial infarction.  This was done and he subsequently underwent a Cardiolite study, which showed no ischemia and EF of 57%. He was discharged to home on July 26, 2011, and still had the pain, he saw [name] today, GI service and was referred to [name]. After [name] talked to [name], it was his recommendation that rather coming to the office, he should be admitted directly for possible cholecystectomy.  The patient has had episodes of epigastric pain going to the right side in the past.  It has been going on since at least July 21, 2011, when he had his first CT scan.  He says it never gets better.  He says it is not associated with oral intake or fatty foods. He says it hurts all the time now.  He has a history of GERD, but is not like his GERD symptoms, it is not like his MI pain and he remains tender in the right upper quadrant.  He has daily bowel movements.  No blood. No constipation.  No diarrhea.  He has had no nausea or vomiting with this.  He was subsequently admitted directly to see CS service for evaluation and treatment.  PAST MEDICAL  HISTORY: 1. Hospitalized from August 12 through August 14 with chest pain, MI     was ruled out.  He has a history of MI in 2005 with a stent. 2. History of hypertension. 3. History of diabetes is listed, but the patient denies this. 4. History of hypertension. 5. History of tobacco use. 6. History of GERD. 7. Degenerative joint disease. 8. History of anemia. 9. He had a right thyroid mass, which was benign, 1 year ago removed     by Dr. Haroldine Laws. 10.He had an EGD, endoscopy and esophageal dilatation in 2003.  He     also underwent a colonoscopy in December 2011 with polypectomy     snare and biopsy.  PAST SURGICAL HISTORY:  He was told he had an ulcer and underwent laparotomy at age 89, it turned out to be his appendix.  He had degenerative disk disease with laminectomy on February 18, 2011.  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  FAMILY HISTORY:  Father died with  lung cancer.  Mother also died with breathing problems, she was a smoker.  One brother in good health, one brother died in [place] and no sisters.  SOCIAL HISTORY:  He is a retired Curator.  He is married.  His wife is with him.  He smoked for 40 years, pack-and-a-half per day.  His wife made him quit at 49.  Alcohol none.  Drugs none.  REVIEW OF SYSTEMS:  Negative for fever.  No history of syncope, presyncope, stroke, or seizures.  SKIN:  No changes.  PSYCH:  No changes.  PULMONARY:  No problems with shortness of breath, DOE, or upper respiratory problems.  CARDIAC:  He just underwent workup and MI was ruled out.  Cardiolite was normal.  GI:  Positive for GERD. Negative for nausea, vomiting, diarrhea, constipation or blood.  GU: Negative for blood.  No trouble voiding.  LOWER EXTREMITIES:  No edema. No claudication symptoms.  MUSCULOSKELETAL:  Positive for arthritis. ENDOCRINE:  Denies any endocrine issues.  CURRENT MEDICATIONS:  Aspirin 81 mg daily, Nexium 40 mg daily, he was given  a prescription for Carafate which he did not fill, lisinopril 10 mg daily, Toprol-XL 50 mg daily, and Trilipix 135 mg daily.  ALLERGIES:  Niaspan causes significant reaction.  PHYSICAL EXAMINATION:  GENERAL:  Well-nourished, well-developed overweight white male in no acute distress. VITAL SIGNS:  Temperature is 97.5, heart rate is 74, blood pressure is 127/83, respiratory rate is 18, sats 98% on room air. HEAD:  Normocephalic. EYES, EARS, NOSE, AND THROAT:  Within normal limits. NECK:  Trachea is in midline.  No bruits.  No JVD.  No thyromegaly. CHEST:  Clear to auscultation and percussion. CARDIAC:  Normal S1 and S2.  No murmurs or rubs. ABDOMEN:  Soft, positive bowel sounds.  He is not distended.  He is tender in the right upper quadrant.  No hernias, masses, or abscesses. He has a well-healed midline surgical scar. GU/RECTAL:  Deferred. MUSCULOSKELETAL:  No acute changes. SKIN:  No changes. NEUROLOGIC:  Cranial nerves II-XII are intact. PSYCH:  Normal affect.  LABORATORY DATA:  White count is 5.9, hemoglobin 11.6, hematocrit is 35, platelets 302,000.  Lipase is 26, total bilirubin is 0.7, direct bilirubin is 0.3, alk phos 77, SGOT is 27, SGPT is slightly elevated at 72.  DIAGNOSTICS:  CT scan done on July 21, 2011, showed a 4 mm midureteral calculus with minimal/mild obstructive findings, normal kidney and ureter on the right, cholelithiasis.  Chest x-ray was normal on July 24, 2011.  Abdominal ultrasound on July 24, 2011, shows cholelithiasis without acute cholecystitis, hepatic steatosis, no hydronephrosis, previously seen left ureteral calculus had passed and was nonobstructing.  Myocardial scan done on July 25, 2011.  There is focal scarring involving the anterior septal wall, mild hypokinesis, LV function was normal.  There was no significant interval change from his last study July 2011.  EF was 57%.  IMPRESSION: 1. Symptomatic cholelithiasis. 2. Status post  left ureteral calculus is passed. 3. Recent rule out of myocardial infarction and myocardial scan     normal. 4. History of myocardial infarction with stent. 5. Hypertension. 6. Body mass index of 32, height 72 inches, weight 237 pounds. 7. Gastroesophageal reflux disease. 8. Dyslipidemia. 9. 40-year history of tobacco use. 10.History of degenerative joint disease. 11.History of recent back surgery. 12.History of benign thyroid mass.  PLAN:  Plan was discussed with the patient.  Plan cholecystectomy today. We pointed out this may not relieve his symptoms, but after evaluation by  [name] and [name] and with his ongoing discomfort, it is reasonable course at this point, the patient is agreeable.  We will aim to do it later this afternoon.     Eber Hong, P.A.   ______________________________ Ardeth Sportsman, MD    WDJ/MEDQ  D:  07/29/2011  T:  07/29/2011  Job:  [id number]  cc:   Eduardo Osier. Sharyn Lull, M.D. Fax: [phone]  Aida Puffer, MD Fax: [phone]  [name]  Electronically Signed by Sherrie George P.A. on 08/04/2011 09:25:33 PM Electronically Signed by Karie Soda MD on 08/11/2011 08:20:26 AM

## 2011-08-14 ENCOUNTER — Emergency Department (HOSPITAL_COMMUNITY)
Admission: EM | Admit: 2011-08-14 | Discharge: 2011-08-14 | Disposition: A | Discharge status: Home / Self Care | Payer: Medicare Other | Attending: Emergency Medicine | Admitting: Emergency Medicine

## 2011-08-14 ENCOUNTER — Emergency Department (HOSPITAL_COMMUNITY): Payer: Medicare Other

## 2011-08-14 DIAGNOSIS — R109 Unspecified abdominal pain: Secondary | ICD-10-CM | POA: Insufficient documentation

## 2011-08-14 DIAGNOSIS — I10 Essential (primary) hypertension: Secondary | ICD-10-CM | POA: Insufficient documentation

## 2011-08-14 DIAGNOSIS — Z79899 Other long term (current) drug therapy: Secondary | ICD-10-CM | POA: Insufficient documentation

## 2011-08-14 DIAGNOSIS — R110 Nausea: Secondary | ICD-10-CM | POA: Insufficient documentation

## 2011-08-14 DIAGNOSIS — N201 Calculus of ureter: Secondary | ICD-10-CM | POA: Insufficient documentation

## 2011-08-14 DIAGNOSIS — I252 Old myocardial infarction: Secondary | ICD-10-CM | POA: Insufficient documentation

## 2011-08-14 DIAGNOSIS — E7800 Pure hypercholesterolemia, unspecified: Secondary | ICD-10-CM | POA: Insufficient documentation

## 2011-08-14 LAB — CBC
Hemoglobin: 10.9 g/dL — ABNORMAL LOW (ref 13.0–17.0)
MCH: 24.1 pg — ABNORMAL LOW (ref 26.0–34.0)
MCV: 75 fL — ABNORMAL LOW (ref 78.0–100.0)
RBC: 4.52 MIL/uL (ref 4.22–5.81)

## 2011-08-14 LAB — DIFFERENTIAL
Lymphocytes Relative: 25 % (ref 12–46)
Lymphs Abs: 1.7 10*3/uL (ref 0.7–4.0)
Monocytes Relative: 9 % (ref 3–12)
Neutro Abs: 4.2 10*3/uL (ref 1.7–7.7)
Neutrophils Relative %: 62 % (ref 43–77)

## 2011-08-14 LAB — URINE MICROSCOPIC-ADD ON

## 2011-08-14 LAB — URINALYSIS, ROUTINE W REFLEX MICROSCOPIC
Ketones, ur: NEGATIVE mg/dL
Nitrite: NEGATIVE
pH: 6 (ref 5.0–8.0)

## 2011-08-14 LAB — POCT I-STAT, CHEM 8
BUN: 19 mg/dL (ref 6–23)
Chloride: 103 mEq/L (ref 96–112)
Potassium: 3.9 mEq/L (ref 3.5–5.1)
Sodium: 138 mEq/L (ref 135–145)

## 2011-08-16 LAB — URINE CULTURE
Colony Count: NO GROWTH
Culture: NO GROWTH

## 2011-08-18 ENCOUNTER — Encounter (INDEPENDENT_AMBULATORY_CARE_PROVIDER_SITE_OTHER): Payer: Medicare Other | Admitting: Surgery

## 2011-08-24 ENCOUNTER — Encounter (INDEPENDENT_AMBULATORY_CARE_PROVIDER_SITE_OTHER): Payer: Medicare Other | Admitting: Surgery

## 2011-08-31 NOTE — Discharge Summary (Signed)
NAME:  Francisco Collier, Francisco Collier               ACCOUNT NO.:  [id number]  MEDICAL RECORD NO.:  [id number]  LOCATION:  1538                         FACILITY:  [facility]  PHYSICIAN:  Francisco Sportsman, MD     DATE OF BIRTH:  [birth year]  DATE OF ADMISSION:  07/29/2011 DATE OF DISCHARGE:  07/30/2011                              DISCHARGE SUMMARY   ADMISSION DIAGNOSES: 1. Symptomatic cholelithiasis. 2. Status post left ureteral calculus which has passed. 3. Recent rule out myocardial infarction with myocardial scan. 4. Prior myocardial infarction with stent. 5. Hypertension. 6. Body mass index of 32. 7. Gastroesophageal reflux disease. 8. Dyslipidemia. 9. 40-year history of tobacco use. 10.Degenerative joint disease. 11.History recent back surgery. 12.History of thyroid mass.  DISCHARGE DIAGNOSES: 1. Right upper quadrant pain, probable cholelithiasis and     cholecystitis. 2. Possible intermittent small-bowel obstruction secondary to     adhesions.3. 2-12 as above.  PROCEDURES:  Laparoscopic lysis of adhesions (45 minutes) and laparoscopic cholecystectomy with intraoperative cholangiogram July 29, 2011, Dr. Karie Soda.  BRIEF HISTORY:  The patient is a 70 year old white male who was referred to Francisco Collier by Francisco Collier of the GI service.  The patient presents with abdominal pain.  He has episodes of epigastric discomfort going to his right side.  This has been going on since at least July 21, 2011. He has had an extensive workup and despite multiple evaluations, nothing has made it better.  He initially said it was not associated with meals or fatty foods but later Francisco Collier did get some indication that it might be associated with food.  He notes it hurts all the time.  He has a history of GERD but this is not like his GERD symptoms.  It is not like his prior MI pain.  He remains tender in the right upper quadrant.  He has had ongoing daily bowel movements.  He denies any  blood, constipation or diarrhea.  He has had no nausea or vomiting with this. He was subsequently admitted directly to General Surgery for evaluation and treatment.  He has recently had a CT scan, which showed a left ureteral calculus which was nonobstructive.  He was admitted 08/12 with chest pain by Francisco Collier.  He was ruled out for MI and a subsequent Cardiolite study was normal with an EF of 57%.  PAST MEDICAL HISTORY: 1. History of MI with prior stent 2005.  Hospitalized 08/12 through     8/14 with rule out MI. 2. Hypertension. 3. He has diabetes listed but denies this. 4. Hypertension. 5. History of tobacco use. 6. History of GERD. 7. Degenerative joint disease. 8. Anemia. 9. He had a right thyroid mass removed, which was benign, 1 year ago. 10.History of the EGD endoscopy and esophageal dilatation.  He also     has last colonoscopy 11/2010 with polypectomy,and biopsy.     For further history and physical, please see the dictated note.  CURRENT MEDICATIONS: 1. Aspirin 81 mg daily. 2. Nexium 40 mg daily. 3. Carafate was ordered but he did not fill this. 4. Lisinopril 10 mg daily. 5. Toprol 50 mg daily. 6. Trilipix 135 mg daily.  ALLERGIES:  NIASPAN  causes significant reaction.  For further history and physical, please see the dictated note.  HOSPITAL COURSE:  The patient was  a direct admit from Francisco Collier office.  He was n.p.o.  He was still tender in the right upper quadrant.  He was seen and evaluated by Francisco Collier and it was his decision to go ahead with cholecystectomy at that time. The risks and benefits were discussed with the patient; he agreed.  He was taken to the OR that day.  During the laparoscopy portion, he was found to have multiple adhesions with some kinks which might be responsible for intermittent small-bowel obstructions.  The patient underwent lysis of adhesions and then cholecystectomy.  He tolerated the procedure well, was returned to the  floor.  The following a.m., he was seen by Francisco Collier.  He was alert, vital signs were stable.  His incision was looking good.  He started taking p.o.'s.  And was subsequently discharge later in the afternoon of 07/30/2011.  DISCHARGE MEDICATIONS:  Included his preadmission medicines as listed above.  He was also given pain medication.  DISCHARGE ACTIVITY:  Light to moderate.  No lifting over 20 pounds for 2 weeks.  FOLLOWUP:  He was to call and schedule followup in 2 to 3 weeks with Dr. Karie Soda.  CONDITION ON DISCHARGE:  Improved.  Dictated For:  Francisco Sportsman, MD     Francisco Collier, P.A.   ______________________________ Francisco Sportsman, MD    WDJ/MEDQ  D:  08/19/2011  T:  08/19/2011  Job:  [id number]  cc:   Barbette Hair. Arlyce Dice, MD,FACG [address] [place] [address]  Francisco Puffer, MD Fax: [phone]  Francisco Collier. Sharyn Collier, M.D. Fax: [phone]  Electronically Signed by Francisco Collier P.A. on 08/29/2011 03:38:14 PM Electronically Signed by Karie Soda MD on 08/31/2011 08:18:49 AM

## 2011-12-04 ENCOUNTER — Encounter (HOSPITAL_COMMUNITY): Payer: Self-pay | Admitting: Emergency Medicine

## 2011-12-04 ENCOUNTER — Emergency Department (HOSPITAL_COMMUNITY): Payer: Medicare Other

## 2011-12-04 ENCOUNTER — Emergency Department (HOSPITAL_COMMUNITY)
Admission: EM | Admit: 2011-12-04 | Discharge: 2011-12-05 | Disposition: A | Discharge status: Home / Self Care | Payer: Medicare Other | Attending: Emergency Medicine | Admitting: Emergency Medicine

## 2011-12-04 DIAGNOSIS — J40 Bronchitis, not specified as acute or chronic: Secondary | ICD-10-CM | POA: Insufficient documentation

## 2011-12-04 DIAGNOSIS — IMO0001 Reserved for inherently not codable concepts without codable children: Secondary | ICD-10-CM | POA: Insufficient documentation

## 2011-12-04 DIAGNOSIS — R079 Chest pain, unspecified: Secondary | ICD-10-CM | POA: Insufficient documentation

## 2011-12-04 DIAGNOSIS — J3489 Other specified disorders of nose and nasal sinuses: Secondary | ICD-10-CM | POA: Insufficient documentation

## 2011-12-04 DIAGNOSIS — R05 Cough: Secondary | ICD-10-CM | POA: Insufficient documentation

## 2011-12-04 DIAGNOSIS — R062 Wheezing: Secondary | ICD-10-CM | POA: Insufficient documentation

## 2011-12-04 DIAGNOSIS — Z7901 Long term (current) use of anticoagulants: Secondary | ICD-10-CM | POA: Insufficient documentation

## 2011-12-04 DIAGNOSIS — R509 Fever, unspecified: Secondary | ICD-10-CM | POA: Insufficient documentation

## 2011-12-04 DIAGNOSIS — E785 Hyperlipidemia, unspecified: Secondary | ICD-10-CM | POA: Insufficient documentation

## 2011-12-04 DIAGNOSIS — R059 Cough, unspecified: Secondary | ICD-10-CM | POA: Insufficient documentation

## 2011-12-04 DIAGNOSIS — I2510 Atherosclerotic heart disease of native coronary artery without angina pectoris: Secondary | ICD-10-CM | POA: Insufficient documentation

## 2011-12-04 DIAGNOSIS — R51 Headache: Secondary | ICD-10-CM | POA: Insufficient documentation

## 2011-12-04 DIAGNOSIS — I252 Old myocardial infarction: Secondary | ICD-10-CM | POA: Insufficient documentation

## 2011-12-04 DIAGNOSIS — Z79899 Other long term (current) drug therapy: Secondary | ICD-10-CM | POA: Insufficient documentation

## 2011-12-04 MED ORDER — ALBUTEROL SULFATE (5 MG/ML) 0.5% IN NEBU
5.0000 mg | INHALATION_SOLUTION | Freq: Once | RESPIRATORY_TRACT | Status: AC
  Administered 2011-12-04: 5 mg via RESPIRATORY_TRACT
  Filled 2011-12-04: qty 0.5

## 2011-12-04 MED ORDER — ALBUTEROL SULFATE (5 MG/ML) 0.5% IN NEBU
INHALATION_SOLUTION | RESPIRATORY_TRACT | Status: AC
  Filled 2011-12-04: qty 0.5

## 2011-12-04 MED ORDER — PREDNISONE 20 MG PO TABS
60.0000 mg | ORAL_TABLET | Freq: Once | ORAL | Status: AC
  Administered 2011-12-04: 60 mg via ORAL
  Filled 2011-12-04: qty 3

## 2011-12-04 MED ORDER — KETOROLAC TROMETHAMINE 60 MG/2ML IM SOLN
60.0000 mg | Freq: Once | INTRAMUSCULAR | Status: AC
  Administered 2011-12-04: 60 mg via INTRAMUSCULAR
  Filled 2011-12-04: qty 2

## 2011-12-04 NOTE — ED Notes (Signed)
Pt states seen at pcp on Friday and given atb's. Pt states "im not feeling any better" pt c/o cough, congestion and n/v. Pt not actively coughing. Pt appears well hydrated and nourished.

## 2011-12-04 NOTE — ED Provider Notes (Signed)
History     CSN: [id number]  Arrival date & time 12/04/11  2127   None     10:59 PM HPI Patient reports upper respiratory tract for 3 days. Nonproductive cough, sinus pressure, nasal congestion, pleuritic chest pain, and headache. Reports trying Tylenol she relieve fever and delsym for  cough but otherwise symptoms have been persistent.Patient states he saw his primary care provider who gave him a "shot" and prescribed antibiotics. States he is currently taking amoxicillin. Reports no improvement. Patient is a 70 y.o. male presenting with URI.  URI The primary symptoms include fever, headaches, cough and myalgias. Primary symptoms do not include sore throat. The current episode started 3 to 5 days ago. This is a new problem. The problem has been gradually worsening.  The headache is not associated with neck stiffness or weakness.   The myalgias are not associated with weakness.  Symptoms associated with the illness include chills, facial pain, sinus pressure, congestion and rhinorrhea.    Past Medical History  Diagnosis Date  . Kidney stone   . Hypertension   . S/P orchiectomy   . Diverticulitis   . Prostatic hypertrophy   . Gastric ulcer   . MI (myocardial infarction)   . CAD (coronary artery disease)   . Esophageal stricture   . Hyperlipemia     Past Surgical History  Procedure Date  . Appendectomy   . Angioplasty     Stent  . Thyroid surgery   . Cholecystectomy   . Cholecystectomy     Family History  Problem Relation Age of Onset  . Colon cancer Neg Hx   . Lung cancer Father     History  Substance Use Topics  . Smoking status: Former Games developer  . Smokeless tobacco: Never Used  . Alcohol Use: No      Review of Systems  Constitutional: Positive for fever and chills.  HENT: Positive for congestion, rhinorrhea and sinus pressure. Negative for sore throat, neck pain and neck stiffness.   Respiratory: Positive for cough. Negative for shortness of breath.     Cardiovascular: Positive for chest pain.  Musculoskeletal: Positive for myalgias.  Neurological: Positive for headaches. Negative for dizziness, weakness and numbness.  All other systems reviewed and are negative.    Allergies  Codeine and Niaspan  Home Medications   Current Outpatient Rx  Name Route Sig Dispense Refill  . ASPIRIN 81 MG PO TABS Oral Take 81 mg by mouth daily. Take 1 daily     . CHOLINE FENOFIBRATE 135 MG PO CPDR Oral Take 135 mg by mouth daily.      Marland Kitchen ESOMEPRAZOLE MAGNESIUM 40 MG PO CPDR Oral Take 40 mg by mouth daily before breakfast.      . LISINOPRIL 10 MG PO TABS Oral Take 10 mg by mouth daily. 1 tab daily.     Marland Kitchen METOPROLOL SUCCINATE ER 50 MG PO TB24 Oral Take 50 mg by mouth daily.        BP 118/71  Pulse 93  Temp(Src) 99 F (37.2 C) (Oral)  Resp 20  SpO2 96%  Physical Exam  Constitutional: He is oriented to person, place, and time. He appears well-developed and well-nourished. No distress.  HENT:  Head: Normocephalic and atraumatic.  Right Ear: External ear normal.  Left Ear: External ear normal.  Nose: Nose normal.  Mouth/Throat: Oropharynx is clear and moist. No oropharyngeal exudate.  Eyes: Conjunctivae are normal. Pupils are equal, round, and reactive to light.  Neck: Normal range of  motion. Neck supple.  Cardiovascular: Normal rate, regular rhythm and normal heart sounds.   Pulmonary/Chest: Effort normal. Not tachypneic. No respiratory distress. He has wheezes. He has no rhonchi. He has no rales.  Abdominal: Soft. Bowel sounds are normal.  Neurological: He is alert and oriented to person, place, and time.  Skin: Skin is warm and dry. No rash noted. He is not diaphoretic. No erythema. No pallor.  Psychiatric: He has a normal mood and affect. His behavior is normal.    ED Course  Procedures  Dg Chest 2 View  12/04/2011  *RADIOLOGY REPORT*  Clinical Data: Cough, congestion, nausea and vomiting.  CHEST - 2 VIEW  Comparison: Chest radiograph  performed 07/24/2011  Findings: The lungs are well-aerated.  Mild peribronchial thickening is noted.  There is no evidence of focal opacification, pleural effusion or pneumothorax.  The heart is normal in size; the mediastinal contour is within normal limits.  No acute osseous abnormalities are seen.  IMPRESSION: Mild peribronchial thickening noted; lungs otherwise clear.  Original Report Authenticated By: Tonia Ghent, M.D.    MDM    12:21 AM  Significantly improved lung exam. Clear to auscultation anteriorly and posteriorly. Will discharge home with albuterol inhaler, prednisone prescription. Advised to take ibuprofen for headache.     Thomasene Lot, PA 12/05/11 0021  Thomasene Lot, PA 12/05/11 [phone]

## 2011-12-04 NOTE — ED Notes (Signed)
Pt ambulatory to restroom steady gait noted. No distress noted. No cough noted.

## 2011-12-04 NOTE — ED Notes (Signed)
Patient transported to X-ray 

## 2011-12-05 MED ORDER — ALBUTEROL SULFATE HFA 108 (90 BASE) MCG/ACT IN AERS
2.0000 | INHALATION_SPRAY | Freq: Once | RESPIRATORY_TRACT | Status: AC
  Administered 2011-12-05: 2 via RESPIRATORY_TRACT
  Filled 2011-12-05: qty 6.7

## 2011-12-05 MED ORDER — PREDNISONE (PAK) 10 MG PO TABS: ORAL_TABLET | ORAL | Status: AC

## 2011-12-05 MED ORDER — FLUTICASONE PROPIONATE 50 MCG/ACT NA SUSP: 2.0000 | Freq: Every day | NASAL | Status: DC

## 2011-12-05 NOTE — ED Provider Notes (Signed)
Medical screening examination/treatment/procedure(s) were performed by non-physician practitioner and as supervising physician I was immediately available for consultation/collaboration.   Lyanne Co, MD 12/05/11 2213

## 2013-05-01 ENCOUNTER — Encounter (HOSPITAL_COMMUNITY): Payer: Self-pay

## 2013-05-01 ENCOUNTER — Observation Stay (HOSPITAL_COMMUNITY)
Admission: EM | Admit: 2013-05-01 | Discharge: 2013-05-03 | Disposition: A | Discharge status: Home / Self Care | Payer: Medicare Other | Attending: Cardiology | Admitting: Cardiology

## 2013-05-01 ENCOUNTER — Emergency Department (HOSPITAL_COMMUNITY): Payer: Medicare Other

## 2013-05-01 DIAGNOSIS — I2510 Atherosclerotic heart disease of native coronary artery without angina pectoris: Secondary | ICD-10-CM | POA: Insufficient documentation

## 2013-05-01 DIAGNOSIS — R0602 Shortness of breath: Secondary | ICD-10-CM | POA: Insufficient documentation

## 2013-05-01 DIAGNOSIS — Z87891 Personal history of nicotine dependence: Secondary | ICD-10-CM | POA: Insufficient documentation

## 2013-05-01 DIAGNOSIS — E7800 Pure hypercholesterolemia, unspecified: Secondary | ICD-10-CM | POA: Insufficient documentation

## 2013-05-01 DIAGNOSIS — E119 Type 2 diabetes mellitus without complications: Secondary | ICD-10-CM | POA: Insufficient documentation

## 2013-05-01 DIAGNOSIS — M1990 Unspecified osteoarthritis, unspecified site: Secondary | ICD-10-CM | POA: Insufficient documentation

## 2013-05-01 DIAGNOSIS — Z9861 Coronary angioplasty status: Secondary | ICD-10-CM | POA: Insufficient documentation

## 2013-05-01 DIAGNOSIS — R072 Precordial pain: Principal | ICD-10-CM | POA: Insufficient documentation

## 2013-05-01 DIAGNOSIS — I252 Old myocardial infarction: Secondary | ICD-10-CM | POA: Insufficient documentation

## 2013-05-01 DIAGNOSIS — Z79899 Other long term (current) drug therapy: Secondary | ICD-10-CM | POA: Insufficient documentation

## 2013-05-01 DIAGNOSIS — K219 Gastro-esophageal reflux disease without esophagitis: Secondary | ICD-10-CM | POA: Insufficient documentation

## 2013-05-01 DIAGNOSIS — Z23 Encounter for immunization: Secondary | ICD-10-CM | POA: Insufficient documentation

## 2013-05-01 DIAGNOSIS — R079 Chest pain, unspecified: Secondary | ICD-10-CM

## 2013-05-01 DIAGNOSIS — I10 Essential (primary) hypertension: Secondary | ICD-10-CM | POA: Insufficient documentation

## 2013-05-01 NOTE — ED Provider Notes (Signed)
History     CSN: [id number]  Arrival date & time 05/01/13  2208   First MD Initiated Contact with Patient 05/01/13 2220      Chief Complaint  Patient presents with  . Chest Pain    (Consider location/radiation/quality/duration/timing/severity/associated sxs/prior treatment) Patient is a 72 y.o. male presenting with chest pain. The history is provided by the patient and the spouse.  Chest Pain Pain location:  Substernal area Pain quality: tightness   Pain radiates to:  Does not radiate Pain radiates to the back: no   Pain severity:  Moderate Onset quality:  Gradual Duration:  20 hours Timing:  Constant Progression:  Unchanged Chronicity:  New Context: at rest   Relieved by:  Nitroglycerin and oxygen Worsened by:  Nothing tried Ineffective treatments:  None tried Associated symptoms: shortness of breath   Risk factors: male sex     Past Medical History  Diagnosis Date  . Kidney stone   . Hypertension   . S/P orchiectomy   . Diverticulitis   . Prostatic hypertrophy   . Gastric ulcer   . MI (myocardial infarction)   . CAD (coronary artery disease)   . Esophageal stricture   . Hyperlipemia     Past Surgical History  Procedure Laterality Date  . Appendectomy    . Angioplasty      Stent  . Thyroid surgery    . Cholecystectomy    . Cholecystectomy      Family History  Problem Relation Age of Onset  . Colon cancer Neg Hx   . Lung cancer Father     History  Substance Use Topics  . Smoking status: Former Games developer  . Smokeless tobacco: Never Used  . Alcohol Use: No      Review of Systems  Respiratory: Positive for shortness of breath.   Cardiovascular: Positive for chest pain. Negative for leg swelling.  All other systems reviewed and are negative.    Allergies  Codeine and Niaspan  Home Medications   Current Outpatient Rx  Name  Route  Sig  Dispense  Refill  . aspirin 81 MG tablet   Oral   Take 81 mg by mouth daily after supper. Take 1  daily         . Choline Fenofibrate 135 MG capsule   Oral   Take 135 mg by mouth daily after supper.          Marland Kitchen ketotifen ([employer] ALLERGY EYE DROPS) 0.025 % ophthalmic solution   Both Eyes   Place 1-2 drops into both eyes 2 (two) times daily as needed (for itchy eyes due to seasonal allergies).         Marland Kitchen lisinopril (PRINIVIL,ZESTRIL) 10 MG tablet   Oral   Take 10 mg by mouth daily after supper. 1 tab daily.         . metoprolol (TOPROL-XL) 50 MG 24 hr tablet   Oral   Take 50 mg by mouth daily after supper.          . esomeprazole (NEXIUM) 40 MG capsule   Oral   Take 40 mg by mouth daily as needed (indigestion).          . EXPIRED: fluticasone (FLONASE) 50 MCG/ACT nasal spray   Nasal   Place 2 sprays into the nose daily.   16 g   2     BP 116/71  Pulse 69  Temp(Src) 98.4 F (36.9 C) (Oral)  Resp 22  SpO2 96%  Physical Exam  Constitutional: He is oriented to person, place, and time. He appears well-developed and well-nourished.  HENT:  Head: Normocephalic and atraumatic.  Mouth/Throat: Oropharynx is clear and moist.  Eyes: Conjunctivae are normal. Pupils are equal, round, and reactive to light.  Neck: Normal range of motion. Neck supple.  Cardiovascular: Normal rate and regular rhythm.   Pulmonary/Chest: He has decreased breath sounds.  Abdominal: Soft. Bowel sounds are normal. There is no tenderness. There is no rebound and no guarding.  Musculoskeletal: Normal range of motion. He exhibits no edema.  Neurological: He is alert and oriented to person, place, and time.  Skin: Skin is warm and dry. He is not diaphoretic.  Psychiatric: He has a normal mood and affect.    ED Course  Procedures (including critical care time)  Labs Reviewed  CBC   No results found.   No diagnosis found.    MDM   Date: 05/01/2013  Rate: 69  Rhythm: normal sinus rhythm  QRS Axis: normal  Intervals: normal  ST/T Wave abnormalities: normal  Conduction Disutrbances:  none  Narrative Interpretation: unremarkable            Nelvin Tomb K Karesa Maultsby-Rasch, MD 05/02/13 [phone]

## 2013-05-01 NOTE — ED Notes (Signed)
Per ems- pt c/o chest pressure that started at 0300 today. Pt associates pain with SOB. Pt states pain got much worse at 1900 today, states pain is similar to past MI. Initial BP 160/100. Pt NSR on monitor, HR 76. O2- 98% on RA. Lungs CTA. 20g IV placed in right hand. Pt received 324asa and 1nitro pta, currently pain free.

## 2013-05-01 NOTE — ED Notes (Addendum)
Pt states he was also dizzy all day. Pt states pain and sob is worse with exacerbation. Pt also c/o productive cough with white sputum. Pt states he has mild abd pain as well. Denies n/v/d. Denies fever/chills

## 2013-05-01 NOTE — ED Notes (Signed)
Assisted [place], EMT with undressing of pt, placing in gown, on monitor, continuous pulse oximetry and blood pressure cuff; vitals and EKG performed; callbell within reach

## 2013-05-01 NOTE — ED Notes (Signed)
Report given to Paula, RN.

## 2013-05-02 LAB — TROPONIN I
Troponin I: <0.3 ng/mL (ref <0.30)
Troponin I: <0.3 ng/mL (ref <0.30)
Troponin I: <0.3 ng/mL (ref <0.30)
Troponin I: <0.3 ng/mL (ref <0.30)

## 2013-05-02 LAB — POCT I-STAT TROPONIN I: Troponin i, poc: 0.01 ng/mL (ref 0.00–0.08)

## 2013-05-02 LAB — COMPREHENSIVE METABOLIC PANEL
Albumin: 3.3 g/dL — ABNORMAL LOW (ref 3.5–5.2)
Alkaline Phosphatase: 54 U/L (ref 39–117)
BUN: 15 mg/dL (ref 6–23)
Potassium: 4.5 mEq/L (ref 3.5–5.1)
Sodium: 139 mEq/L (ref 135–145)
Total Protein: 6.5 g/dL (ref 6.0–8.3)

## 2013-05-02 LAB — CBC WITH DIFFERENTIAL/PLATELET
Basophils Absolute: 0 10*3/uL (ref 0.0–0.1)
Basophils Relative: 0 % (ref 0–1)
MCHC: 32.8 g/dL (ref 30.0–36.0)
Neutro Abs: 3.1 10*3/uL (ref 1.7–7.7)
Neutrophils Relative %: 57 % (ref 43–77)
RDW: 15 % (ref 11.5–15.5)

## 2013-05-02 LAB — POCT I-STAT, CHEM 8
BUN: 16 mg/dL (ref 6–23)
HCT: 38 % — ABNORMAL LOW (ref 39.0–52.0)
Sodium: 140 mEq/L (ref 135–145)
TCO2: 29 mmol/L (ref 0–100)

## 2013-05-02 LAB — PROTIME-INR: INR: 1.08 (ref 0.00–1.49)

## 2013-05-02 LAB — CBC
MCH: 26.5 pg (ref 26.0–34.0)
Platelets: 279 10*3/uL (ref 150–400)
RBC: 4.76 MIL/uL (ref 4.22–5.81)
RDW: 15 % (ref 11.5–15.5)
WBC: 8 10*3/uL (ref 4.0–10.5)

## 2013-05-02 LAB — HEPARIN LEVEL (UNFRACTIONATED): Heparin Unfractionated: 0.23 IU/mL — ABNORMAL LOW (ref 0.30–0.70)

## 2013-05-02 LAB — APTT: aPTT: 36 seconds (ref 24–37)

## 2013-05-02 MED ORDER — PNEUMOCOCCAL VAC POLYVALENT 25 MCG/0.5ML IJ INJ
0.5000 mL | INJECTION | INTRAMUSCULAR | Status: AC
  Administered 2013-05-03: 0.5 mL via INTRAMUSCULAR
  Filled 2013-05-02: qty 0.5

## 2013-05-02 MED ORDER — ASPIRIN 81 MG PO CHEW
CHEWABLE_TABLET | ORAL | Status: AC
  Filled 2013-05-02: qty 4

## 2013-05-02 MED ORDER — ASPIRIN 81 MG PO TABS: 81.0000 mg | ORAL_TABLET | Freq: Every day | ORAL | Status: DC

## 2013-05-02 MED ORDER — NITROGLYCERIN 0.4 MG SL SUBL: 0.4000 mg | SUBLINGUAL_TABLET | SUBLINGUAL | Status: DC | PRN

## 2013-05-02 MED ORDER — HEPARIN (PORCINE) IN NACL 100-0.45 UNIT/ML-% IJ SOLN
1600.0000 [IU]/h | INTRAMUSCULAR | Status: DC
  Filled 2013-05-02 (×4): qty 250

## 2013-05-02 MED ORDER — HEPARIN BOLUS VIA INFUSION
2000.0000 [IU] | Freq: Once | INTRAVENOUS | Status: AC
  Administered 2013-05-02: 2000 [IU] via INTRAVENOUS
  Filled 2013-05-02: qty 2000

## 2013-05-02 MED ORDER — ALBUTEROL SULFATE (5 MG/ML) 0.5% IN NEBU
INHALATION_SOLUTION | RESPIRATORY_TRACT | Status: AC
  Filled 2013-05-02: qty 0.5

## 2013-05-02 MED ORDER — ASPIRIN 81 MG PO CHEW: 81.0000 mg | CHEWABLE_TABLET | Freq: Every day | ORAL | Status: DC

## 2013-05-02 MED ORDER — NITROGLYCERIN 0.4 MG SL SUBL
SUBLINGUAL_TABLET | SUBLINGUAL | Status: AC
  Administered 2013-05-02: 0.4 mg via SUBLINGUAL
  Filled 2013-05-02: qty 25

## 2013-05-02 MED ORDER — ASPIRIN EC 81 MG PO TBEC
81.0000 mg | DELAYED_RELEASE_TABLET | Freq: Every day | ORAL | Status: DC
  Administered 2013-05-03: 81 mg via ORAL
  Filled 2013-05-02: qty 1

## 2013-05-02 MED ORDER — ACETAMINOPHEN 325 MG PO TABS
650.0000 mg | ORAL_TABLET | ORAL | Status: DC | PRN
  Administered 2013-05-02 – 2013-05-03 (×3): 650 mg via ORAL
  Filled 2013-05-02 (×2): qty 2

## 2013-05-02 MED ORDER — HEPARIN (PORCINE) IN NACL 100-0.45 UNIT/ML-% IJ SOLN: 12.0000 [IU]/kg/h | INTRAMUSCULAR | Status: DC

## 2013-05-02 MED ORDER — ATORVASTATIN CALCIUM 20 MG PO TABS
20.0000 mg | ORAL_TABLET | Freq: Every day | ORAL | Status: DC
  Administered 2013-05-02 – 2013-05-03 (×2): 20 mg via ORAL
  Filled 2013-05-02 (×2): qty 1

## 2013-05-02 MED ORDER — ONDANSETRON HCL 4 MG/2ML IJ SOLN: 4.0000 mg | Freq: Four times a day (QID) | INTRAMUSCULAR | Status: DC | PRN

## 2013-05-02 MED ORDER — ALBUTEROL SULFATE (5 MG/ML) 0.5% IN NEBU
5.0000 mg | INHALATION_SOLUTION | Freq: Once | RESPIRATORY_TRACT | Status: AC
  Administered 2013-05-02: 5 mg via RESPIRATORY_TRACT
  Filled 2013-05-02: qty 1

## 2013-05-02 MED ORDER — PANTOPRAZOLE SODIUM 40 MG PO TBEC
40.0000 mg | DELAYED_RELEASE_TABLET | Freq: Every day | ORAL | Status: DC
  Administered 2013-05-02 – 2013-05-03 (×2): 40 mg via ORAL
  Filled 2013-05-02 (×2): qty 1

## 2013-05-02 MED ORDER — HEPARIN BOLUS VIA INFUSION
4000.0000 [IU] | Freq: Once | INTRAVENOUS | Status: AC
  Administered 2013-05-02: 4000 [IU] via INTRAVENOUS

## 2013-05-02 MED ORDER — NITROGLYCERIN 2 % TD OINT
0.5000 [in_us] | TOPICAL_OINTMENT | Freq: Once | TRANSDERMAL | Status: AC
  Administered 2013-05-02: 0.5 [in_us] via TOPICAL
  Filled 2013-05-02: qty 1

## 2013-05-02 MED ORDER — LISINOPRIL 10 MG PO TABS
10.0000 mg | ORAL_TABLET | Freq: Every day | ORAL | Status: DC
  Administered 2013-05-02 – 2013-05-03 (×2): 10 mg via ORAL
  Filled 2013-05-02 (×2): qty 1

## 2013-05-02 MED ORDER — ONDANSETRON HCL 4 MG/2ML IJ SOLN: 4.0000 mg | Freq: Three times a day (TID) | INTRAMUSCULAR | Status: AC | PRN

## 2013-05-02 MED ORDER — NITROGLYCERIN 2 % TD OINT
0.5000 [in_us] | TOPICAL_OINTMENT | Freq: Four times a day (QID) | TRANSDERMAL | Status: DC
  Administered 2013-05-02 – 2013-05-03 (×5): 0.5 [in_us] via TOPICAL
  Filled 2013-05-02: qty 30

## 2013-05-02 MED ORDER — HEPARIN (PORCINE) IN NACL 100-0.45 UNIT/ML-% IJ SOLN
1500.0000 [IU]/h | INTRAMUSCULAR | Status: DC
  Administered 2013-05-02: 1300 [IU]/h via INTRAVENOUS
  Administered 2013-05-02: 1500 [IU]/h via INTRAVENOUS
  Filled 2013-05-02 (×2): qty 250

## 2013-05-02 MED ORDER — ASPIRIN 300 MG RE SUPP
300.0000 mg | RECTAL | Status: AC
  Filled 2013-05-02: qty 1

## 2013-05-02 MED ORDER — ASPIRIN 81 MG PO CHEW
324.0000 mg | CHEWABLE_TABLET | ORAL | Status: AC
  Administered 2013-05-02: 324 mg via ORAL

## 2013-05-02 MED ORDER — METOPROLOL SUCCINATE ER 50 MG PO TB24
50.0000 mg | ORAL_TABLET | Freq: Every day | ORAL | Status: DC
  Administered 2013-05-02 – 2013-05-03 (×2): 50 mg via ORAL
  Filled 2013-05-02 (×2): qty 1

## 2013-05-02 MED ORDER — CLOPIDOGREL BISULFATE 300 MG PO TABS
300.0000 mg | ORAL_TABLET | Freq: Once | ORAL | Status: AC
  Administered 2013-05-02: 300 mg via ORAL
  Filled 2013-05-02: qty 1

## 2013-05-02 NOTE — Progress Notes (Addendum)
ANTICOAGULATION CONSULT NOTE - follow up Consult  Pharmacy Consult for Heparin Indication: chest pain/ACS  Allergies  Allergen Reactions  . Codeine Nausea Only    Pill form causes nausea Iv form does not  . Niaspan (Niacin Er) Other (See Comments)    Severe flushing    Patient Measurements: Height: 6\' 1"  (185.4 cm) Weight: 236 lb 4.8 oz (107.185 kg) IBW/kg (Calculated) : 79.9 Heparin Dosing Weight: 100 kg  Vital Signs: Temp: 98 F (36.7 C) (05/22 0502) Temp src: Oral (05/22 0502) BP: 113/72 mmHg (05/22 0700) Pulse Rate: 76 (05/22 0700)  Labs:  Recent Labs  05/01/13 2357 05/02/13 0015 05/02/13 0418 05/02/13 0938  HGB 12.6* 12.9*  --  11.8*  HCT 37.8* 38.0*  --  36.0*  PLT 279  --   --  249  APTT  --   --   --  36  LABPROT  --   --   --  13.9  INR  --   --   --  1.08  HEPARINUNFRC  --   --   --  <0.10*  CREATININE  --  1.30  --  1.15  TROPONINI  --   --  <0.30 <0.30    Estimated Creatinine Clearance: 76.8 ml/min (by C-G formula based on Cr of 1.15).   Medical History: Past Medical History  Diagnosis Date  . Kidney stone   . Hypertension   . S/P orchiectomy   . Diverticulitis   . Prostatic hypertrophy   . Gastric ulcer   . MI (myocardial infarction)   . CAD (coronary artery disease)   . Esophageal stricture   . Hyperlipemia     Medications:  ASA  Tricor  Nexium  Flonase  Zestril  Toprol XL  Assessment: 72 yo male with  Hx CAD/MI/ LAD stent admitted with new chest pain/SOB.  Heparin drip was started on admission drip rate 1300 uts/hr HL < 0.1.  CBC stable,  No bleeding noted. Stress test planned for tomorrow.    Goal of Therapy:  Heparin level 0.3-0.7 units/ml Monitor platelets by anticoagulation protocol: Yes   Plan:  Heparin 2000 units IV bolus, then increase heparin drip 1500 units/hr Check heparin level in 6 hours.  Leota Sauers Pharm.D. CPP, BCPS Clinical Pharmacist [phone] 05/02/2013 11:51 AM

## 2013-05-02 NOTE — Progress Notes (Signed)
ANTICOAGULATION CONSULT NOTE - Initial Consult  Pharmacy Consult for Heparin Indication: chest pain/ACS  Allergies  Allergen Reactions  . Codeine Nausea Only    Pill form causes nausea Iv form does not  . Niaspan (Niacin Er) Other (See Comments)    Severe flushing    Patient Measurements: Height: 6\' 1"  (185.4 cm) Weight: 236 lb (107.049 kg) IBW/kg (Calculated) : 79.9 Heparin Dosing Weight: 100 kg  Vital Signs: Temp: 98.2 F (36.8 C) (05/22 0054) Temp src: Oral (05/22 0054) BP: 139/72 mmHg (05/22 0054) Pulse Rate: 66 (05/22 0054)  Labs:  Recent Labs  05/01/13 2357 05/02/13 0015  HGB 12.6* 12.9*  HCT 37.8* 38.0*  PLT 279  --   CREATININE  --  1.30    Estimated Creatinine Clearance: 67.8 ml/min (by C-G formula based on Cr of 1.3).   Medical History: Past Medical History  Diagnosis Date  . Kidney stone   . Hypertension   . S/P orchiectomy   . Diverticulitis   . Prostatic hypertrophy   . Gastric ulcer   . MI (myocardial infarction)   . CAD (coronary artery disease)   . Esophageal stricture   . Hyperlipemia     Medications:  ASA  Tricor  Nexium  Flonase  Zestril  Toprol XL  Assessment: 72 yo male with chest pain/SOB for heparin  Goal of Therapy:  Heparin level 0.3-0.7 units/ml Monitor platelets by anticoagulation protocol: Yes   Plan:  Heparin 4000 units IV bolus, then 1300 units/hr Check heparin level in 6 hours.  Sharena Dibenedetto, Gary Fleet 05/02/2013,12:57 AM

## 2013-05-02 NOTE — Progress Notes (Signed)
Utilization review completed.  

## 2013-05-02 NOTE — ED Notes (Signed)
States  He is breathing much easier after albuterol tx.  Denies CP at this time x with deep inspiration.  Family at bedside.

## 2013-05-02 NOTE — H&P (Signed)
Francisco Collier is an 72 y.o. male.   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 Patient received sublingual nitroglycerin in ER with relief of chest tightness. Chest pain was localized. Not related to breathing coughing. Patient denies any history of PND orthopnea leg swelling denies palpitation lightheadedness or syncope. Patient states chest tightness felt similar in nature when he had MI. EKG done in the ER showed no acute ischemic changes troponin so far has been negative.  Past Medical History  Diagnosis Date  . Kidney stone   . Hypertension   . S/P orchiectomy   . Diverticulitis   . Prostatic hypertrophy   . Gastric ulcer   . MI (myocardial infarction)   . CAD (coronary artery disease)   . Esophageal stricture   . Hyperlipemia     Past Surgical History  Procedure Laterality Date  . Appendectomy    . Angioplasty      Stent  . Thyroid surgery    . Cholecystectomy    . Cholecystectomy      Family History  Problem Relation Age of Onset  . Colon cancer Neg Hx   . Lung cancer Father    Social History:  reports that he has quit smoking. He has never used smokeless tobacco. He reports that he does not drink alcohol or use illicit drugs.  Allergies:  Allergies  Allergen Reactions  . Codeine Nausea Only    Pill form causes nausea Iv form does not  . Niaspan (Niacin Er) Other (See Comments)    Severe flushing    Medications Prior to Admission  Medication Sig Dispense  Refill  . aspirin 81 MG tablet Take 81 mg by mouth daily after supper. Take 1 daily      . Choline Fenofibrate 135 MG capsule Take 135 mg by mouth daily after supper.       Marland Kitchen ketotifen ([employer] ALLERGY EYE DROPS) 0.025 % ophthalmic solution Place 1-2 drops into both eyes 2 (two) times daily as needed (for itchy eyes due to seasonal allergies).      Marland Kitchen lisinopril (PRINIVIL,ZESTRIL) 10 MG tablet Take 10 mg by mouth daily after supper. 1 tab daily.      . metoprolol (TOPROL-XL) 50 MG 24 hr tablet Take 50 mg by mouth daily after supper.       . esomeprazole (NEXIUM) 40 MG capsule Take 40 mg by mouth daily as needed (indigestion).       . fluticasone (FLONASE) 50 MCG/ACT nasal spray Place 2 sprays into the nose daily.  16 g  2    Results for orders placed during the hospital encounter of 05/01/13 (from the past 48 hour(s))  CBC     Status: Abnormal   Collection Time    05/01/13 11:57 PM      Result Value Range   WBC 8.0  4.0 - 10.5 K/uL   RBC 4.76  4.22 - 5.81 MIL/uL   Hemoglobin 12.6 (*) 13.0 - 17.0 g/dL   HCT 29.5 (*) 62.1 - 30.8 %   MCV 79.4  78.0 - 100.0 fL  MCH 26.5  26.0 - 34.0 pg   MCHC 33.3  30.0 - 36.0 g/dL   RDW 40.9  81.1 - 91.4 %   Platelets 279  150 - 400 K/uL  POCT I-STAT TROPONIN I     Status: None   Collection Time    05/02/13 12:13 AM      Result Value Range   Troponin i, poc 0.01  0.00 - 0.08 ng/mL   Comment 3            Comment: Due to the release kinetics of cTnI,     a negative result within the first hours     of the onset of symptoms does not rule out     myocardial infarction with certainty.     If myocardial infarction is still suspected,     repeat the test at appropriate intervals.  POCT I-STAT, CHEM 8     Status: Abnormal   Collection Time    05/02/13 12:15 AM      Result Value Range   Sodium 140  135 - 145 mEq/L   Potassium 4.5  3.5 - 5.1 mEq/L   Chloride 103  96 - 112 mEq/L   BUN 16  6 - 23 mg/dL   Creatinine, Ser 7.82  0.50 - 1.35 mg/dL   Glucose,  Bld 956 (*) 70 - 99 mg/dL   Calcium, Ion 2.13  0.86 - 1.30 mmol/L   TCO2 29  0 - 100 mmol/L   Hemoglobin 12.9 (*) 13.0 - 17.0 g/dL   HCT 57.8 (*) 46.9 - 62.9 %  TROPONIN I     Status: None   Collection Time    05/02/13  4:18 AM      Result Value Range   Troponin I <0.30  <0.30 ng/mL   Comment:            Due to the release kinetics of cTnI,     a negative result within the first hours     of the onset of symptoms does not rule out     myocardial infarction with certainty.     If myocardial infarction is still suspected,     repeat the test at appropriate intervals.   Dg Chest 2 View  05/01/2013   *RADIOLOGY REPORT*  Clinical Data: Chest pain.  CHEST - 2 VIEW  Comparison:  12/04/2011  Findings:  The heart size and mediastinal contours are within normal limits.  Both lungs are clear.  The visualized skeletal structures are unremarkable.  IMPRESSION: No active cardiopulmonary disease.   Original Report Authenticated By: Myles Rosenthal, M.D.    Review of Systems  Constitutional: Negative for fever, chills, weight loss and malaise/fatigue.  Eyes: Negative for blurred vision, double vision and photophobia.  Cardiovascular: Positive for chest pain. Negative for palpitations, orthopnea and claudication.  Gastrointestinal: Negative for nausea, vomiting and abdominal pain.  Genitourinary: Negative for dysuria.  Neurological: Negative for dizziness and headaches.    Blood pressure 113/72, pulse 76, temperature 98 F (36.7 C), temperature source Oral, resp. rate 18, height 6\' 1"  (1.854 m), weight 107.185 kg (236 lb 4.8 oz), SpO2 96.00%. Physical Exam  Constitutional: He is oriented to person, place, and time. He appears well-developed and well-nourished.  HENT:  Head: Normocephalic and atraumatic.  Eyes: Conjunctivae are normal. Pupils are equal, round, and reactive to light. Left eye exhibits no discharge. No scleral icterus.  Neck: Normal range of motion. Neck supple. No JVD present. No  tracheal deviation present.  No thyromegaly present.  Cardiovascular: Normal rate, regular rhythm and normal heart sounds.  Exam reveals no gallop and no friction rub.   Respiratory: Effort normal and breath sounds normal. No respiratory distress. He has no wheezes. He has no rales.  GI: Soft. Bowel sounds are normal. He exhibits no distension. There is no tenderness. There is no rebound and no guarding.  Musculoskeletal: He exhibits no edema and no tenderness.  Neurological: He is alert and oriented to person, place, and time.     Assessment/Plan Unstable angina rule out MI Coronary artery disease history of anteroseptal wall MI in the past Hypertension Non-insulin-dependent diabetes mellitus controlled by diet Hypercholesteremia Remote tobacco abuse Degenerative joint disease GERD Plan As per orders We'll schedule for nuclear stress test tomorrow  Robynn Pane 05/02/2013, 8:09 AM

## 2013-05-02 NOTE — Progress Notes (Signed)
ANTICOAGULATION CONSULT NOTE: HEPARIN  Indication: Chest pain/ACS  Heparin level = 0.23 on 1500 units/hr Goal heparin level = 0.3-0.7  Heparin level remains slightly subtherapeutic. Will increase the heparin drip to 1600 units/hr. F/u heparin level in AM.  Cardell Peach, PharmD

## 2013-05-03 ENCOUNTER — Observation Stay (HOSPITAL_COMMUNITY): Payer: Medicare Other

## 2013-05-03 LAB — CBC
MCH: 26.3 pg (ref 26.0–34.0)
MCV: 80 fL (ref 78.0–100.0)
Platelets: 230 10*3/uL (ref 150–400)
RBC: 4.26 MIL/uL (ref 4.22–5.81)

## 2013-05-03 MED ORDER — REGADENOSON 0.4 MG/5ML IV SOLN
0.4000 mg | Freq: Once | INTRAVENOUS | Status: AC
  Administered 2013-05-03: 0.4 mg via INTRAVENOUS
  Filled 2013-05-03: qty 5

## 2013-05-03 MED ORDER — NITROGLYCERIN 0.4 MG SL SUBL: 0.4000 mg | SUBLINGUAL_TABLET | SUBLINGUAL | Status: DC | PRN

## 2013-05-03 MED ORDER — TECHNETIUM TC 99M SESTAMIBI GENERIC - CARDIOLITE
30.0000 | Freq: Once | INTRAVENOUS | Status: AC | PRN
  Administered 2013-05-03: 30 via INTRAVENOUS

## 2013-05-03 MED ORDER — TECHNETIUM TC 99M SESTAMIBI GENERIC - CARDIOLITE
10.0000 | Freq: Once | INTRAVENOUS | Status: AC | PRN
  Administered 2013-05-03: 10 via INTRAVENOUS

## 2013-05-03 NOTE — Progress Notes (Signed)
ANTICOAGULATION CONSULT NOTE - Follow Up Consult  Pharmacy Consult for Heparin Indication: chest pain/ACS  Allergies  Allergen Reactions  . Codeine Nausea Only    Pill form causes nausea Iv form does not  . Niaspan (Niacin Er) Other (See Comments)    Severe flushing    Patient Measurements: Height: 6\' 1"  (185.4 cm) Weight: 235 lb 8 oz (106.822 kg) IBW/kg (Calculated) : 79.9 Heparin Dosing Weight:  100 kg  Vital Signs: Temp: 97.8 F (36.6 C) (05/23 0604) Temp src: Oral (05/23 0604) BP: 135/73 mmHg (05/23 0604) Pulse Rate: 66 (05/23 0604)  Labs:  Recent Labs  05/01/13 2357 05/02/13 0015  05/02/13 0938 05/02/13 1538 05/02/13 1725 05/02/13 2029 05/03/13 0435  HGB 12.6* 12.9*  --  11.8*  --   --   --  11.2*  HCT 37.8* 38.0*  --  36.0*  --   --   --  34.1*  PLT 279  --   --  249  --   --   --  230  APTT  --   --   --  36  --   --   --   --   LABPROT  --   --   --  13.9  --   --   --   --   INR  --   --   --  1.08  --   --   --   --   HEPARINUNFRC  --   --   --  <0.10*  --  0.23*  --  0.36  CREATININE  --  1.30  --  1.15  --   --   --   --   TROPONINI  --   --   < > <0.30 <0.30  --  <0.30  --   < > = values in this interval not displayed.  Estimated Creatinine Clearance: 76.7 ml/min (by C-G formula based on Cr of 1.15).   Assessment: Chest pain - stress test 5/23  Anticoagulation: ACS on IV heparin. Heparin level 0.36 in goal range this am. Hgb trending down slightly to 11.2. Plts 230 trending down slowly. No bleeding noted   Cardiovascular: Known CAD s/p PCI, HTN, HLD- asa81, lisin, toprol, NTP. VSS this am. Cardiac enzymes have been negative.  Endo: DM (diet controlled)  Gastrointestinal / Nutrition ppi po for GERD (h/o gatric ulcer)  Nephrology: BPH, Scr 1.15 with CrCl 76  Pulm: remote h/o tobacco use  Goal of Therapy:  Heparin level 0.3-0.7 units/ml Monitor platelets by anticoagulation protocol: Yes   Plan:  Nuclear stress test Continue IV heparin  at 1600 units/hr. Heparin level and CBC daily.  Merilynn Finland, [employer] 05/03/2013,8:38 AM

## 2013-05-03 NOTE — Progress Notes (Signed)
DC orders received.  Patient stable with no S/S of distress.  Medication and discharge information reviewed with patient and patient's wife.  Patient DC home with wife. Francisco Collier  

## 2013-05-03 NOTE — Discharge Summary (Signed)
NAME:  Francisco Collier, Francisco Collier               ACCOUNT NO.:  [id number]  MEDICAL RECORD NO.:  [id number]  LOCATION:  3W06C                        FACILITY:  [facility]  PHYSICIAN:  Kinney Sackmann N. Sharyn Lull, M.D. DATE OF BIRTH:  June 22, 1941  DATE OF ADMISSION:  05/01/2013 DATE OF DISCHARGE:  05/03/2013                              DISCHARGE SUMMARY   ADMITTING DIAGNOSES: 1. Chest pain, rule out myocardial infarction. 2. Coronary artery disease. 3. History of anteroseptal wall myocardial infarction in the past,     status post percutaneous coronary intervention to left anterior     descending. 4. Hypertension. 5. 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 6. Hypercholesteremia. 7. Remote tobacco abuse. 8. Degenerative joint disease. 9. Gastroesophageal reflux disease.  FINAL DIAGNOSES: 1. Status post chest pain, myocardial infarction ruled out, negative     nuclear stress test. 2. Coronary artery disease. 3. History of anteroseptal wall myocardial infarction in the past,     status post percutaneous coronary intervention to left anterior     descending, stable. 4. Hypertension. 5. Non-insulin-dependent diabetes mellitus, controlled by diet. 6. Hypercholesteremia. 7. Remote tobacco abuse. 8. Degenerative joint disease. 9. Gastroesophageal reflux disease.  DISCHARGE HOME MEDICATIONS: 1. Enteric-coated aspirin 81 mg 1 tablet daily. 2. Fenofibrate 135 mg 1 capsule daily. 3. Nexium 40 mg 1 capsule daily. 4. Flonase 2 sprays daily as before. 5. Lisinopril 10 mg 1 tablet daily. 6. Toprol-XL 50 mg 1 tablet daily. 7. Nitrostat 0.4 mg sublingual use as directed.  DIET:  Low salt, low cholesterol, 1800-calorie ADA diet.  ACTIVITY:  Increase activity as tolerated.  CONDITION AT DISCHARGE:  Stable.  FOLLOWUP:  Follow up with me in 1 week.  BRIEF HISTORY AND HOSPITAL COURSE:  Mr. Cowden is a 72 year old male with past medical history significant for coronary artery disease, history  of anteroseptal wall myocardial infarction in the past, status post PTCA stenting to LAD, hypertension, non-insulin-dependent diabetes mellitus controlled by diet, hypercholesteremia, remote tobacco abuse, GERD, degenerative joint disease, came to the ER complaining of recurrent retrosternal chest pain described as tightness associated with shortness of breath and diaphoresis  since yesterday morning off and on, lasting for few minutes.  The patient received sublingual nitro in the ER with relief of chest pain.  Chest pain was localized, not related to breathing or coughing.  The patient denies any history of PND, orthopnea, or leg swelling.  Denies palpitation, lightheadedness, or syncope.  The patient states chest tightness felt similar in nature when he had MI.  EKG done in the ER showed no acute ischemic changes. Troponin I was negative in the ED.  PAST MEDICAL HISTORY:  As above.  PHYSICAL EXAMINATION:  VITAL SIGNS:  Blood pressure was 113/72, pulse was 76.  He was afebrile. HEENT:  Conjunctivae was pink. NECK:  Supple.  No JVD.  No bruit. LUNGS:  Clear to auscultation without rhonchi or rales. CARDIOVASCULAR:  S1, S2 was normal.  There was no S3 or S4 gallop. ABDOMEN:  Soft, obese, and nontender. EXTREMITIES:  No clubbing, cyanosis, or edema. NEUROLOGIC:  Grossly intact.  LABORATORY DATA:  Sodium was 140, potassium 4.5, BUN 16, creatinine 1.30.  Repeat BUN 15, creatinine 1.15.  Blood sugar  was 104.  Four sets of troponin-I were negative.  Hemoglobin was 12.6, hematocrit 37.8, white count of 8.0.  His TSH was 3.21.  Chest x-ray showed no active cardiopulmonary disease.  His Lexiscan Myoview scan showed no evidence of inducible ischemia.  There was fixed defect at the LV apex, which was related to prior MI, EF of 54%.  EKG showed normal sinus rhythm with no acute ischemic changes.  BRIEF HOSPITAL COURSE:  The patient was admitted to telemetry unit.  MI was ruled out by serial  enzymes and EKG.  The patient subsequently underwent Lexiscan Myoview stress test, which showed no evidence of ischemia as above with EF of 54% with small area of scarring from prior MI.  The patient has been ambulating in hallway and room without any problems.  The patient did not have any further episodes of chest pain during the hospital stay.  The patient will be discharged home on above medications and will be followed up in my office in 1 week.     Eduardo Osier. Sharyn Lull, M.D.     [facility]/MEDQ  D:  05/03/2013  T:  05/03/2013  Job:  [id number]

## 2013-05-03 NOTE — Discharge Summary (Signed)
  Discharge summary dictated on 05/03/2013 dictation number is [phone]

## 2013-05-08 ENCOUNTER — Emergency Department (HOSPITAL_COMMUNITY): Payer: Medicare Other

## 2013-05-08 ENCOUNTER — Emergency Department (HOSPITAL_COMMUNITY)
Admission: EM | Admit: 2013-05-08 | Discharge: 2013-05-08 | Disposition: A | Discharge status: Home / Self Care | Payer: Medicare Other | Attending: Emergency Medicine | Admitting: Emergency Medicine

## 2013-05-08 ENCOUNTER — Encounter (HOSPITAL_COMMUNITY): Payer: Self-pay | Admitting: Emergency Medicine

## 2013-05-08 DIAGNOSIS — Z9079 Acquired absence of other genital organ(s): Secondary | ICD-10-CM | POA: Insufficient documentation

## 2013-05-08 DIAGNOSIS — Z885 Allergy status to narcotic agent status: Secondary | ICD-10-CM | POA: Insufficient documentation

## 2013-05-08 DIAGNOSIS — J209 Acute bronchitis, unspecified: Secondary | ICD-10-CM | POA: Insufficient documentation

## 2013-05-08 DIAGNOSIS — I2510 Atherosclerotic heart disease of native coronary artery without angina pectoris: Secondary | ICD-10-CM | POA: Insufficient documentation

## 2013-05-08 DIAGNOSIS — R0609 Other forms of dyspnea: Secondary | ICD-10-CM | POA: Insufficient documentation

## 2013-05-08 DIAGNOSIS — R059 Cough, unspecified: Secondary | ICD-10-CM | POA: Insufficient documentation

## 2013-05-08 DIAGNOSIS — Z79899 Other long term (current) drug therapy: Secondary | ICD-10-CM | POA: Insufficient documentation

## 2013-05-08 DIAGNOSIS — I10 Essential (primary) hypertension: Secondary | ICD-10-CM | POA: Insufficient documentation

## 2013-05-08 DIAGNOSIS — Z8719 Personal history of other diseases of the digestive system: Secondary | ICD-10-CM | POA: Insufficient documentation

## 2013-05-08 DIAGNOSIS — R05 Cough: Secondary | ICD-10-CM | POA: Insufficient documentation

## 2013-05-08 DIAGNOSIS — R0682 Tachypnea, not elsewhere classified: Secondary | ICD-10-CM | POA: Insufficient documentation

## 2013-05-08 DIAGNOSIS — Z87442 Personal history of urinary calculi: Secondary | ICD-10-CM | POA: Insufficient documentation

## 2013-05-08 DIAGNOSIS — I252 Old myocardial infarction: Secondary | ICD-10-CM | POA: Insufficient documentation

## 2013-05-08 DIAGNOSIS — J40 Bronchitis, not specified as acute or chronic: Secondary | ICD-10-CM

## 2013-05-08 DIAGNOSIS — Z888 Allergy status to other drugs, medicaments and biological substances status: Secondary | ICD-10-CM | POA: Insufficient documentation

## 2013-05-08 DIAGNOSIS — E785 Hyperlipidemia, unspecified: Secondary | ICD-10-CM | POA: Insufficient documentation

## 2013-05-08 DIAGNOSIS — Z87891 Personal history of nicotine dependence: Secondary | ICD-10-CM | POA: Insufficient documentation

## 2013-05-08 DIAGNOSIS — R0989 Other specified symptoms and signs involving the circulatory and respiratory systems: Secondary | ICD-10-CM | POA: Insufficient documentation

## 2013-05-08 DIAGNOSIS — R000 Tachycardia, unspecified: Secondary | ICD-10-CM | POA: Insufficient documentation

## 2013-05-08 DIAGNOSIS — R079 Chest pain, unspecified: Secondary | ICD-10-CM | POA: Insufficient documentation

## 2013-05-08 DIAGNOSIS — Z7982 Long term (current) use of aspirin: Secondary | ICD-10-CM | POA: Insufficient documentation

## 2013-05-08 LAB — D-DIMER, QUANTITATIVE: D-Dimer, Quant: 0.69 ug/mL-FEU — ABNORMAL HIGH (ref 0.00–0.48)

## 2013-05-08 LAB — COMPREHENSIVE METABOLIC PANEL
Albumin: 3.6 g/dL (ref 3.5–5.2)
Alkaline Phosphatase: 60 U/L (ref 39–117)
BUN: 20 mg/dL (ref 6–23)
Creatinine, Ser: 1.17 mg/dL (ref 0.50–1.35)
Potassium: 4.1 mEq/L (ref 3.5–5.1)
Total Protein: 7.4 g/dL (ref 6.0–8.3)

## 2013-05-08 LAB — CBC
HCT: 37.8 % — ABNORMAL LOW (ref 39.0–52.0)
MCHC: 33.9 g/dL (ref 30.0–36.0)
RDW: 15.2 % (ref 11.5–15.5)

## 2013-05-08 LAB — PRO B NATRIURETIC PEPTIDE: Pro B Natriuretic peptide (BNP): 56.1 pg/mL (ref 0–125)

## 2013-05-08 LAB — POCT I-STAT TROPONIN I: Troponin i, poc: 0 ng/mL (ref 0.00–0.08)

## 2013-05-08 MED ORDER — ALBUTEROL SULFATE (5 MG/ML) 0.5% IN NEBU
5.0000 mg | INHALATION_SOLUTION | Freq: Once | RESPIRATORY_TRACT | Status: AC
  Administered 2013-05-08: 5 mg via RESPIRATORY_TRACT
  Filled 2013-05-08: qty 1

## 2013-05-08 MED ORDER — PREDNISONE 20 MG PO TABS
60.0000 mg | ORAL_TABLET | Freq: Once | ORAL | Status: AC
  Administered 2013-05-08: 60 mg via ORAL
  Filled 2013-05-08: qty 3

## 2013-05-08 MED ORDER — ASPIRIN 81 MG PO CHEW
324.0000 mg | CHEWABLE_TABLET | Freq: Once | ORAL | Status: AC
  Administered 2013-05-08: 324 mg via ORAL
  Filled 2013-05-08: qty 4

## 2013-05-08 MED ORDER — AZITHROMYCIN 250 MG PO TABS: 250.0000 mg | ORAL_TABLET | Freq: Every day | ORAL | Status: DC

## 2013-05-08 MED ORDER — IOHEXOL 350 MG/ML SOLN
100.0000 mL | Freq: Once | INTRAVENOUS | Status: AC | PRN
  Administered 2013-05-08: 80 mL via INTRAVENOUS

## 2013-05-08 MED ORDER — ALBUTEROL SULFATE HFA 108 (90 BASE) MCG/ACT IN AERS: 4.0000 | INHALATION_SPRAY | RESPIRATORY_TRACT | Status: DC | PRN

## 2013-05-08 MED ORDER — PREDNISONE 20 MG PO TABS: 60.0000 mg | ORAL_TABLET | Freq: Every day | ORAL | Status: DC

## 2013-05-08 NOTE — ED Provider Notes (Signed)
Complains of shortness of breath with cough productive of white sputum onset 1 week ago. Patient discharged from here 05/01/2013 weight he presented with the same complaint. He had stress Myoview which was negative for ischemia and EF of 54%. On exam no distress, heart regular rhythm lungs scant diffuse crackles, coughing occasionally.  Doug Sou, MD 05/09/13 1610

## 2013-05-08 NOTE — ED Provider Notes (Signed)
History     CSN: [id number]  Arrival date & time 05/08/13  1428   First MD Initiated Contact with Patient 05/08/13 1501      Chief Complaint  Patient presents with  . Shortness of Breath     HPI 72 yo M with history of MI, hypertension presents complaining of dyspnea.  Of note, he was discharged from the hospital about 5 days ago; was admitted for chest pain to rule out MI; he was ruled out via troponins and had a negative nuclear stress test.   His dyspnea was sudden onset.  It woke him up from sleep, "at 3 AM last Wednesday" (one week ago). Dyspnea at rest, worse with exertion. Sharp chest pain with cough. His cough has been productive of yellow/white sputum.  He does not have fever or chills.  He also has no history of COPD, but does have extensive smoking history.  He has been given an albuterol inhaler in the past.  No chest pain at rest currently, however he did have chest pain at rest last week prior to his admission. Saw Dr.Harwani, his cardiologist, today and sent here with concern particularly for PE. No history of PE. No leg swelling. No hemoptysis. He does have the recent hospitalization as a risk factor. No other identifiable risk factors.   Past Medical History  Diagnosis Date  . Kidney stone   . Hypertension   . S/P orchiectomy   . Diverticulitis   . Prostatic hypertrophy   . Gastric ulcer   . MI (myocardial infarction)   . CAD (coronary artery disease)   . Esophageal stricture   . Hyperlipemia     Past Surgical History  Procedure Laterality Date  . Appendectomy    . Angioplasty      Stent  . Thyroid surgery    . Cholecystectomy    . Cholecystectomy      Family History  Problem Relation Age of Onset  . Colon cancer Neg Hx   . Lung cancer Father     History  Substance Use Topics  . Smoking status: Former Games developer  . Smokeless tobacco: Never Used  . Alcohol Use: No      Review of Systems  Constitutional: Negative for fever and chills.  HENT:  Negative for congestion, rhinorrhea, neck pain and neck stiffness.   Eyes: Negative for visual disturbance.  Respiratory: Positive for cough and shortness of breath.   Cardiovascular: Negative for chest pain and leg swelling.  Gastrointestinal: Negative for nausea, vomiting, abdominal pain and diarrhea.  Genitourinary: Negative for dysuria, urgency, frequency, flank pain and difficulty urinating.  Musculoskeletal: Negative for back pain.  Skin: Negative for rash.  Neurological: Negative for syncope, weakness, numbness and headaches.  All other systems reviewed and are negative.    Allergies  Codeine and Niaspan  Home Medications   Current Outpatient Rx  Name  Route  Sig  Dispense  Refill  . aspirin 81 MG tablet   Oral   Take 81 mg by mouth daily after supper. Take 1 daily         . Choline Fenofibrate 135 MG capsule   Oral   Take 135 mg by mouth daily after supper.          . esomeprazole (NEXIUM) 40 MG capsule   Oral   Take 40 mg by mouth daily as needed (indigestion).          Marland Kitchen ketotifen ([employer] ALLERGY EYE DROPS) 0.025 % ophthalmic solution  Both Eyes   Place 1-2 drops into both eyes 2 (two) times daily as needed (for itchy eyes due to seasonal allergies).         Marland Kitchen lisinopril (PRINIVIL,ZESTRIL) 10 MG tablet   Oral   Take 10 mg by mouth daily after supper. 1 tab daily.         . metoprolol (TOPROL-XL) 50 MG 24 hr tablet   Oral   Take 50 mg by mouth daily after supper.          . simvastatin (ZOCOR) 40 MG tablet   Oral   Take 20 mg by mouth daily.         Marland Kitchen albuterol (PROVENTIL HFA;VENTOLIN HFA) 108 (90 BASE) MCG/ACT inhaler   Inhalation   Inhale 4 puffs into the lungs every 4 (four) hours as needed for wheezing.   8.5 g   0   . azithromycin (ZITHROMAX Z-PAK) 250 MG tablet   Oral   Take 1 tablet (250 mg total) by mouth daily. Take 2 tablets by mouth today.  Then, take one tablet by mouth every day for four days.   6 tablet   0   .  nitroGLYCERIN (NITROSTAT) 0.4 MG SL tablet   Sublingual   Place 1 tablet (0.4 mg total) under the tongue every 5 (five) minutes as needed for chest pain.   25 tablet   12   . predniSONE (DELTASONE) 20 MG tablet   Oral   Take 3 tablets (60 mg total) by mouth daily. Take 3 tablets (60 mg) by mouth every day for 5 days.   15 tablet   0     BP 123/67  Pulse 98  Temp(Src) 98.1 F (36.7 C) (Oral)  Resp 23  SpO2 94%  Physical Exam  Nursing note and vitals reviewed. Constitutional: He is oriented to person, place, and time. He appears well-developed and well-nourished. No distress.  HENT:  Head: Normocephalic and atraumatic.  Mouth/Throat: Oropharynx is clear and moist.  Eyes: Conjunctivae and EOM are normal. Pupils are equal, round, and reactive to light. No scleral icterus.  Neck: Normal range of motion. Neck supple. No JVD present.  Cardiovascular: Normal rate, regular rhythm, normal heart sounds and intact distal pulses.  Exam reveals no gallop and no friction rub.   No murmur heard. Pulmonary/Chest: No accessory muscle usage. Tachypnea noted. No respiratory distress. He has decreased breath sounds. He has no wheezes. He has no rhonchi. He has no rales.  Abdominal: Soft. Bowel sounds are normal. He exhibits no distension. There is no tenderness. There is no rebound and no guarding.  Musculoskeletal: He exhibits no edema.  Neurological: He is alert and oriented to person, place, and time. No cranial nerve deficit. He exhibits normal muscle tone. Coordination normal.  Skin: Skin is warm and dry. He is not diaphoretic.    ED Course  Procedures (including critical care time)  Labs Reviewed  CBC - Abnormal; Notable for the following:    Hemoglobin 12.8 (*)    HCT 37.8 (*)    All other components within normal limits  COMPREHENSIVE METABOLIC PANEL - Abnormal; Notable for the following:    Glucose, Bld 138 (*)    GFR calc non Af Amer 61 (*)    GFR calc Af Amer 71 (*)    All  other components within normal limits  D-DIMER, QUANTITATIVE - Abnormal; Notable for the following:    D-Dimer, Quant 0.69 (*)    All other components within normal  limits  PRO B NATRIURETIC PEPTIDE  POCT I-STAT TROPONIN I   Dg Chest 2 View  05/08/2013   *RADIOLOGY REPORT*  Clinical Data: Chest pain, shortness of breath, cough, congestion, history hypertension, coronary artery disease post MI, smoking  CHEST - 2 VIEW  Comparison: 05/01/2013  Findings: Upper-normal size of cardiac silhouette. Mediastinal contours and pulmonary vascularity normal. Lungs clear. No pleural effusion or pneumothorax. Bones unremarkable.  IMPRESSION: No acute abnormalities.   Original Report Authenticated By: Ulyses Southward, M.D.   Ct Angio Chest Pe W/cm &/or Wo Cm  05/08/2013   *RADIOLOGY REPORT*  Clinical Data: Shortness of breath, cough, chest pain and elevated D-dimer.  CT ANGIOGRAPHY CHEST  Technique:  Multidetector CT imaging of the chest using the standard protocol during bolus administration of intravenous contrast. Multiplanar reconstructed images including MIPs were obtained and reviewed to evaluate the vascular anatomy.  Contrast: 80mL OMNIPAQUE IOHEXOL 350 MG/ML SOLN  Comparison: None.  Findings: The pulmonary arteries are adequately opacified.  There is no evidence of pulmonary embolism.  There appears to be a coronary stent in the distribution of the LAD.  The heart size is normal.  No pleural or pericardial fluid is identified.  Lungs show no evidence of infiltrates, overt edema or nodules.  No enlarged lymph nodes are identified.  The thoracic aorta is of normal caliber.  Degenerative changes are present in the spine.  IMPRESSION: No evidence of pulmonary embolism or other acute findings.   Original Report Authenticated By: Irish Lack, M.D.     1. Bronchitis       MDM  72 year old male with extensive smoking history and a history of coronary artery disease presents complaining of cough productive of  yellow sputum and shortness of breath. He was admitted for the same a few days ago and also had chest pain at time. His workup included serial troponins and a negative nuclear stress test. In the interim his symptoms worsened. He has no fever.  He has mild tachypnea, mild tachycardia to the low 100s, oxygen saturations in the mid-90s on room air, otherwise his vitals are within normal limits. He is well-appearing and in no acute distress. Speaking in full sentences. Normal work of breathing. Breath sounds are diffusely diminished but there is no wheezing or rales. No peripheral edema. Normal heart sounds.  Chest x-ray demonstrates no acute cardiopulmonary abnormality. CT angiogram negative for pulmonary embolism. Labs are remarkable for hemoglobin of 12.8, normal CMP, positive d-dimer, normal BNP, negative troponin times one. The troponin was sent per protocol by the triage nurse, however demonstrated he has no chest pain and recent negative stress test I don't feel that any further cardiac workup is needed at this time.  He was treated with albuterol and prednisone. Symptoms significantly improved after breathing treatment. He does have a very long smoking history and has been prescribed albuterol for wheezing before. He likely has bronchitis and possibly undiagnosed COPD. Given his excellent response to treatment, currently asymptomatic, and negative workup otherwise as above, I feel he can be safely discharged home with close followup with his primary care physician. He is given return precautions. Prescribed azithromycin, albuterol, prednisone       Toney Sang, MD 05/09/13 0159

## 2013-05-08 NOTE — ED Notes (Signed)
Pt just d/c on 5 21 for sob and for the past 5 days it has gotten worse . Went to dr today and pt was sent for further tests

## 2013-05-08 NOTE — ED Notes (Signed)
Patient transported to CT 

## 2013-05-09 NOTE — ED Provider Notes (Signed)
I have personally seen and examined the patient.  I have discussed the plan of care with the resident.  I have reviewed the documentation on PMH/FH/Soc. History.  I have reviewed the documentation of the resident and agree.  Xcaret Morad, MD 05/09/13 1108 

## 2013-07-02 ENCOUNTER — Other Ambulatory Visit: Payer: Self-pay | Admitting: Orthopaedic Surgery

## 2013-07-02 DIAGNOSIS — M25562 Pain in left knee: Secondary | ICD-10-CM

## 2013-07-02 DIAGNOSIS — R609 Edema, unspecified: Secondary | ICD-10-CM

## 2013-07-09 ENCOUNTER — Other Ambulatory Visit: Payer: Medicare Other

## 2013-09-25 ENCOUNTER — Encounter: Payer: Self-pay | Admitting: Gastroenterology

## 2013-09-25 ENCOUNTER — Inpatient Hospital Stay (HOSPITAL_COMMUNITY)
Admission: EM | Admit: 2013-09-25 | Discharge: 2013-09-28 | DRG: 192 | Disposition: A | Discharge status: Home / Self Care | Payer: Medicare Other | Attending: Cardiology | Admitting: Cardiology

## 2013-09-25 DIAGNOSIS — E7800 Pure hypercholesterolemia, unspecified: Secondary | ICD-10-CM | POA: Diagnosis present

## 2013-09-25 DIAGNOSIS — J441 Chronic obstructive pulmonary disease with (acute) exacerbation: Principal | ICD-10-CM | POA: Diagnosis present

## 2013-09-25 DIAGNOSIS — M1990 Unspecified osteoarthritis, unspecified site: Secondary | ICD-10-CM | POA: Diagnosis present

## 2013-09-25 DIAGNOSIS — R51 Headache: Secondary | ICD-10-CM | POA: Diagnosis present

## 2013-09-25 DIAGNOSIS — Z9079 Acquired absence of other genital organ(s): Secondary | ICD-10-CM

## 2013-09-25 DIAGNOSIS — R0902 Hypoxemia: Secondary | ICD-10-CM

## 2013-09-25 DIAGNOSIS — K219 Gastro-esophageal reflux disease without esophagitis: Secondary | ICD-10-CM | POA: Diagnosis present

## 2013-09-25 DIAGNOSIS — Z801 Family history of malignant neoplasm of trachea, bronchus and lung: Secondary | ICD-10-CM

## 2013-09-25 DIAGNOSIS — I252 Old myocardial infarction: Secondary | ICD-10-CM

## 2013-09-25 DIAGNOSIS — E785 Hyperlipidemia, unspecified: Secondary | ICD-10-CM | POA: Diagnosis present

## 2013-09-25 DIAGNOSIS — Z87442 Personal history of urinary calculi: Secondary | ICD-10-CM

## 2013-09-25 DIAGNOSIS — I10 Essential (primary) hypertension: Secondary | ICD-10-CM | POA: Diagnosis present

## 2013-09-25 DIAGNOSIS — Z87891 Personal history of nicotine dependence: Secondary | ICD-10-CM

## 2013-09-25 DIAGNOSIS — Z9089 Acquired absence of other organs: Secondary | ICD-10-CM

## 2013-09-25 DIAGNOSIS — E119 Type 2 diabetes mellitus without complications: Secondary | ICD-10-CM | POA: Diagnosis present

## 2013-09-25 DIAGNOSIS — I2510 Atherosclerotic heart disease of native coronary artery without angina pectoris: Secondary | ICD-10-CM | POA: Diagnosis present

## 2013-09-25 DIAGNOSIS — Z9861 Coronary angioplasty status: Secondary | ICD-10-CM

## 2013-09-25 HISTORY — DX: Unspecified osteoarthritis, unspecified site: M19.90

## 2013-09-25 HISTORY — DX: Chronic obstructive pulmonary disease, unspecified: J44.9

## 2013-09-25 HISTORY — DX: Shortness of breath: R06.02

## 2013-09-25 HISTORY — DX: Gastro-esophageal reflux disease without esophagitis: K21.9

## 2013-09-25 LAB — CBC WITH DIFFERENTIAL/PLATELET
Basophils Absolute: 0 10*3/uL (ref 0.0–0.1)
Eosinophils Absolute: 0 10*3/uL (ref 0.0–0.7)
Eosinophils Relative: 0 % (ref 0–5)
Lymphocytes Relative: 13 % (ref 12–46)
Lymphs Abs: 0.8 10*3/uL (ref 0.7–4.0)
MCH: 28.2 pg (ref 26.0–34.0)
MCV: 80 fL (ref 78.0–100.0)
Neutrophils Relative %: 78 % — ABNORMAL HIGH (ref 43–77)
Platelets: 212 10*3/uL (ref 150–400)
RBC: 4.9 MIL/uL (ref 4.22–5.81)
RDW: 14.7 % (ref 11.5–15.5)
WBC: 6.1 10*3/uL (ref 4.0–10.5)

## 2013-09-25 MED ORDER — MORPHINE SULFATE 4 MG/ML IJ SOLN
4.0000 mg | Freq: Once | INTRAMUSCULAR | Status: AC
  Administered 2013-09-25: 4 mg via INTRAVENOUS
  Filled 2013-09-25: qty 1

## 2013-09-25 MED ORDER — SODIUM CHLORIDE 0.9 % IV BOLUS (SEPSIS)
500.0000 mL | Freq: Once | INTRAVENOUS | Status: AC
  Administered 2013-09-25: 500 mL via INTRAVENOUS

## 2013-09-25 NOTE — ED Notes (Signed)
Per EMS: Pt reports neck pain that has moved to right side of head x 2 days.  Pt reports dizziness worse with movement and standing, nausea and blurred vision in right eye starting this evening. Reports 99.2 temp at home. 140/90. 120 ST. Hx: vertigo, MI, 1 stent

## 2013-09-26 ENCOUNTER — Emergency Department (HOSPITAL_COMMUNITY): Payer: Medicare Other

## 2013-09-26 ENCOUNTER — Encounter (HOSPITAL_COMMUNITY): Payer: Self-pay | Admitting: Radiology

## 2013-09-26 LAB — BASIC METABOLIC PANEL
Calcium: 8.9 mg/dL (ref 8.4–10.5)
GFR calc Af Amer: 63 mL/min — ABNORMAL LOW (ref >90)
GFR calc non Af Amer: 55 mL/min — ABNORMAL LOW (ref >90)
Potassium: 4 mEq/L (ref 3.5–5.1)
Sodium: 135 mEq/L (ref 135–145)

## 2013-09-26 LAB — CBC
Hemoglobin: 12.9 g/dL — ABNORMAL LOW (ref 13.0–17.0)
MCV: 81 fL (ref 78.0–100.0)
RBC: 4.62 MIL/uL (ref 4.22–5.81)

## 2013-09-26 LAB — URINALYSIS, ROUTINE W REFLEX MICROSCOPIC
Leukocytes, UA: NEGATIVE
Nitrite: NEGATIVE
Protein, ur: NEGATIVE mg/dL
Specific Gravity, Urine: 1.027 (ref 1.005–1.030)
Urobilinogen, UA: 1 mg/dL (ref 0.0–1.0)

## 2013-09-26 LAB — CREATININE, SERUM
Creatinine, Ser: 1.15 mg/dL (ref 0.50–1.35)
GFR calc non Af Amer: 63 mL/min — ABNORMAL LOW (ref >90)

## 2013-09-26 MED ORDER — PANTOPRAZOLE SODIUM 40 MG PO TBEC
40.0000 mg | DELAYED_RELEASE_TABLET | Freq: Every day | ORAL | Status: DC
  Administered 2013-09-26 – 2013-09-27 (×2): 40 mg via ORAL
  Filled 2013-09-26: qty 1

## 2013-09-26 MED ORDER — BUDESONIDE-FORMOTEROL FUMARATE 160-4.5 MCG/ACT IN AERO
2.0000 | INHALATION_SPRAY | Freq: Two times a day (BID) | RESPIRATORY_TRACT | Status: DC
  Administered 2013-09-26 – 2013-09-28 (×5): 2 via RESPIRATORY_TRACT
  Filled 2013-09-26: qty 6

## 2013-09-26 MED ORDER — ALBUTEROL SULFATE (5 MG/ML) 0.5% IN NEBU
2.5000 mg | INHALATION_SOLUTION | RESPIRATORY_TRACT | Status: DC
  Administered 2013-09-26 (×3): 2.5 mg via RESPIRATORY_TRACT
  Filled 2013-09-26 (×3): qty 0.5

## 2013-09-26 MED ORDER — SODIUM CHLORIDE 0.9 % IV BOLUS (SEPSIS)
500.0000 mL | Freq: Once | INTRAVENOUS | Status: AC
  Administered 2013-09-26: 500 mL via INTRAVENOUS

## 2013-09-26 MED ORDER — ONDANSETRON HCL 4 MG/2ML IJ SOLN: 4.0000 mg | Freq: Three times a day (TID) | INTRAMUSCULAR | Status: AC | PRN

## 2013-09-26 MED ORDER — LISINOPRIL 10 MG PO TABS
10.0000 mg | ORAL_TABLET | Freq: Every day | ORAL | Status: DC
  Administered 2013-09-26 – 2013-09-27 (×2): 10 mg via ORAL
  Filled 2013-09-26 (×3): qty 1

## 2013-09-26 MED ORDER — MORPHINE SULFATE 4 MG/ML IJ SOLN
4.0000 mg | Freq: Once | INTRAMUSCULAR | Status: AC
  Administered 2013-09-26: 4 mg via INTRAVENOUS
  Filled 2013-09-26: qty 1

## 2013-09-26 MED ORDER — SENNA 8.6 MG PO TABS
1.0000 | ORAL_TABLET | Freq: Two times a day (BID) | ORAL | Status: DC
  Administered 2013-09-26 – 2013-09-27 (×4): 8.6 mg via ORAL
  Filled 2013-09-26 (×6): qty 1

## 2013-09-26 MED ORDER — ACETAMINOPHEN 325 MG PO TABS
650.0000 mg | ORAL_TABLET | Freq: Four times a day (QID) | ORAL | Status: DC | PRN
  Administered 2013-09-26: 650 mg via ORAL
  Filled 2013-09-26: qty 2

## 2013-09-26 MED ORDER — METHYLPREDNISOLONE SODIUM SUCC 125 MG IJ SOLR
60.0000 mg | Freq: Three times a day (TID) | INTRAMUSCULAR | Status: DC
  Administered 2013-09-26 – 2013-09-27 (×3): 60 mg via INTRAVENOUS
  Filled 2013-09-26 (×6): qty 0.96

## 2013-09-26 MED ORDER — NITROGLYCERIN 0.4 MG SL SUBL: 0.4000 mg | SUBLINGUAL_TABLET | SUBLINGUAL | Status: DC | PRN

## 2013-09-26 MED ORDER — METOPROLOL SUCCINATE ER 50 MG PO TB24
50.0000 mg | ORAL_TABLET | Freq: Every day | ORAL | Status: DC
  Administered 2013-09-26 – 2013-09-28 (×3): 50 mg via ORAL
  Filled 2013-09-26 (×3): qty 1

## 2013-09-26 MED ORDER — IPRATROPIUM BROMIDE 0.02 % IN SOLN
0.5000 mg | RESPIRATORY_TRACT | Status: AC
  Administered 2013-09-26 (×3): 0.5 mg via RESPIRATORY_TRACT
  Filled 2013-09-26 (×3): qty 2.5

## 2013-09-26 MED ORDER — PANTOPRAZOLE SODIUM 40 MG PO TBEC: 40.0000 mg | DELAYED_RELEASE_TABLET | Freq: Every day | ORAL | Status: DC

## 2013-09-26 MED ORDER — MORPHINE SULFATE 4 MG/ML IJ SOLN: 4.0000 mg | Freq: Four times a day (QID) | INTRAMUSCULAR | Status: DC | PRN

## 2013-09-26 MED ORDER — HEPARIN SODIUM (PORCINE) 5000 UNIT/ML IJ SOLN
5000.0000 [IU] | Freq: Three times a day (TID) | INTRAMUSCULAR | Status: DC
  Administered 2013-09-26 – 2013-09-28 (×6): 5000 [IU] via SUBCUTANEOUS
  Filled 2013-09-26 (×9): qty 1

## 2013-09-26 MED ORDER — ASPIRIN 81 MG PO CHEW
81.0000 mg | CHEWABLE_TABLET | Freq: Every day | ORAL | Status: DC
  Administered 2013-09-26 – 2013-09-28 (×3): 81 mg via ORAL
  Filled 2013-09-26 (×3): qty 1

## 2013-09-26 MED ORDER — ALBUTEROL SULFATE (5 MG/ML) 0.5% IN NEBU
2.5000 mg | INHALATION_SOLUTION | RESPIRATORY_TRACT | Status: AC
  Administered 2013-09-26 (×3): 2.5 mg via RESPIRATORY_TRACT
  Filled 2013-09-26 (×3): qty 0.5

## 2013-09-26 MED ORDER — METHYLPREDNISOLONE SODIUM SUCC 125 MG IJ SOLR
125.0000 mg | Freq: Once | INTRAMUSCULAR | Status: AC
  Administered 2013-09-26: 125 mg via INTRAVENOUS
  Filled 2013-09-26: qty 2

## 2013-09-26 MED ORDER — SIMVASTATIN 20 MG PO TABS
20.0000 mg | ORAL_TABLET | Freq: Every day | ORAL | Status: DC
  Administered 2013-09-26 – 2013-09-27 (×2): 20 mg via ORAL
  Filled 2013-09-26 (×3): qty 1

## 2013-09-26 MED ORDER — IPRATROPIUM BROMIDE 0.02 % IN SOLN
0.5000 mg | RESPIRATORY_TRACT | Status: DC
  Administered 2013-09-26 (×3): 0.5 mg via RESPIRATORY_TRACT
  Filled 2013-09-26 (×3): qty 2.5

## 2013-09-26 MED ORDER — LISINOPRIL 10 MG PO TABS
10.0000 mg | ORAL_TABLET | Freq: Once | ORAL | Status: AC
  Administered 2013-09-26: 10 mg via ORAL
  Filled 2013-09-26: qty 1

## 2013-09-26 NOTE — ED Notes (Signed)
Pt refused rectal temperature 

## 2013-09-26 NOTE — ED Notes (Signed)
Pt refusing rectal temperature. PA made aware.

## 2013-09-26 NOTE — ED Provider Notes (Signed)
CSN: [id number]     Arrival date & time 09/25/13  2301 History   First MD Initiated Contact with Patient 09/25/13 2316     Chief Complaint  Patient presents with  . Headache  . Dizziness   (Consider location/radiation/quality/duration/timing/severity/associated sxs/prior Treatment) Patient is a 72 y.o. male presenting with headaches. The history is provided by the patient and the spouse.  Headache Pain location:  R temporal Associated symptoms: nausea and vomiting   Associated symptoms: no congestion, no cough, no fever, no myalgias, no sinus pressure and no sore throat   Associated symptoms comment:  He complains of right sided headache since yesterday, associated with dizziness and nausea, with blurred vision in the right eye. Symptoms are worse with movement. He feels he has had a fever with hot/cold chills. No sinus pressure, dental pain, sore throat, cough, chest pain, shortness of breath. He denies known tick bite. No rash. There has been generalized weakness.   Past Medical History  Diagnosis Date  . Kidney stone   . Hypertension   . S/P orchiectomy   . Diverticulitis   . Prostatic hypertrophy   . Gastric ulcer   . MI (myocardial infarction)   . CAD (coronary artery disease)   . Esophageal stricture   . Hyperlipemia    Past Surgical History  Procedure Laterality Date  . Appendectomy    . Angioplasty      Stent  . Thyroid surgery    . Cholecystectomy    . Cholecystectomy     Family History  Problem Relation Age of Onset  . Colon cancer Neg Hx   . Lung cancer Father    History  Substance Use Topics  . Smoking status: Former Games developer  . Smokeless tobacco: Never Used  . Alcohol Use: No    Review of Systems  Constitutional: Negative for fever and chills.  HENT: Negative for congestion, sinus pressure and sore throat.   Respiratory: Negative.  Negative for cough and shortness of breath.   Cardiovascular: Negative.  Negative for chest pain and leg swelling.   Gastrointestinal: Positive for nausea and vomiting.  Genitourinary: Negative.  Negative for dysuria.  Musculoskeletal: Negative for myalgias.  Skin: Negative.  Negative for rash.  Neurological: Positive for weakness, light-headedness and headaches.  Psychiatric/Behavioral: Negative for confusion.    Allergies  Codeine and Niaspan  Home Medications   Current Outpatient Rx  Name  Route  Sig  Dispense  Refill  . albuterol (PROVENTIL HFA;VENTOLIN HFA) 108 (90 BASE) MCG/ACT inhaler   Inhalation   Inhale 4 puffs into the lungs every 4 (four) hours as needed for wheezing.   8.5 g   0   . aspirin 81 MG tablet   Oral   Take 81 mg by mouth daily after supper.          . esomeprazole (NEXIUM) 40 MG capsule   Oral   Take 40 mg by mouth daily as needed (indigestion).          Marland Kitchen lisinopril (PRINIVIL,ZESTRIL) 10 MG tablet   Oral   Take 10 mg by mouth daily after supper.          . metoprolol (TOPROL-XL) 50 MG 24 hr tablet   Oral   Take 50 mg by mouth daily after supper.          . nitroGLYCERIN (NITROSTAT) 0.4 MG SL tablet   Sublingual   Place 1 tablet (0.4 mg total) under the tongue every 5 (five) minutes as needed for  chest pain.   25 tablet   12   . simvastatin (ZOCOR) 40 MG tablet   Oral   Take 20 mg by mouth daily.          BP 133/74  Temp(Src) 98.5 F (36.9 C) (Oral)  Resp 30  SpO2 92% Physical Exam  Constitutional: He is oriented to person, place, and time. He appears well-developed and well-nourished.  HENT:  Head: Normocephalic.  Eyes: Conjunctivae are normal. Pupils are equal, round, and reactive to light.  Neck: Normal range of motion. Neck supple.  Cardiovascular: Normal rate and regular rhythm.   Pulmonary/Chest: Effort normal and breath sounds normal. He has no wheezes. He exhibits no tenderness.  Abdominal: Soft. Bowel sounds are normal. There is no tenderness. There is no rebound and no guarding.  Musculoskeletal: Normal range of motion. He  exhibits no edema.  Neurological: He is alert and oriented to person, place, and time. Coordination normal.  No cranial nerve deficits.   Skin: Skin is warm and dry. No rash noted.  Psychiatric: He has a normal mood and affect.    ED Course  Procedures (including critical care time) Labs Review Labs Reviewed  CBC WITH DIFFERENTIAL - Abnormal; Notable for the following:    Neutrophils Relative % 78 (*)    All other components within normal limits  BASIC METABOLIC PANEL - Abnormal; Notable for the following:    Glucose, Bld 106 (*)    GFR calc non Af Amer 55 (*)    GFR calc Af Amer 63 (*)    All other components within normal limits  URINALYSIS, ROUTINE W REFLEX MICROSCOPIC   Results for orders placed during the hospital encounter of 09/25/13  CBC WITH DIFFERENTIAL      Result Value Range   WBC 6.1  4.0 - 10.5 K/uL   RBC 4.90  4.22 - 5.81 MIL/uL   Hemoglobin 13.8  13.0 - 17.0 g/dL   HCT 11.9  14.7 - 82.9 %   MCV 80.0  78.0 - 100.0 fL   MCH 28.2  26.0 - 34.0 pg   MCHC 35.2  30.0 - 36.0 g/dL   RDW 56.2  13.0 - 86.5 %   Platelets 212  150 - 400 K/uL   Neutrophils Relative % 78 (*) 43 - 77 %   Neutro Abs 4.8  1.7 - 7.7 K/uL   Lymphocytes Relative 13  12 - 46 %   Lymphs Abs 0.8  0.7 - 4.0 K/uL   Monocytes Relative 9  3 - 12 %   Monocytes Absolute 0.5  0.1 - 1.0 K/uL   Eosinophils Relative 0  0 - 5 %   Eosinophils Absolute 0.0  0.0 - 0.7 K/uL   Basophils Relative 0  0 - 1 %   Basophils Absolute 0.0  0.0 - 0.1 K/uL  BASIC METABOLIC PANEL      Result Value Range   Sodium 135  135 - 145 mEq/L   Potassium 4.0  3.5 - 5.1 mEq/L   Chloride 98  96 - 112 mEq/L   CO2 25  19 - 32 mEq/L   Glucose, Bld 106 (*) 70 - 99 mg/dL   BUN 17  6 - 23 mg/dL   Creatinine, Ser 7.84  0.50 - 1.35 mg/dL   Calcium 8.9  8.4 - 69.6 mg/dL   GFR calc non Af Amer 55 (*) >90 mL/min   GFR calc Af Amer 63 (*) >90 mL/min   Ct Head Wo Contrast  09/26/2013   CLINICAL DATA:  Headache with dizziness.  EXAM:  CT HEAD WITHOUT CONTRAST  TECHNIQUE: Contiguous axial images were obtained from the base of the skull through the vertex without contrast.  COMPARISON:  MR brain 12/31/08.  FINDINGS: Moderate atrophy. Chronic microvascular ischemic change. No evidence for acute infarction, hemorrhage, mass lesion, hydrocephalus, or extra-axial fluid. Intact calvarium. No sinus or mastoid disease. Stable appearing right infratemporal fossa mass, incompletely evaluated.  IMPRESSION: No acute intracranial abnormality.  Chronic changes as described.   Electronically Signed   By: Davonna Belling M.D.   On: 09/26/2013 01:18   Dg Chest Portable 1 View  09/26/2013   CLINICAL DATA:  Headaches with shortness of breath and weakness.  EXAM: PORTABLE CHEST - 1 VIEW  COMPARISON:  05/08/2013.  FINDINGS: Normal cardiomediastinal silhouette. Slight chronic right hemidiaphragm elevation. No infiltrates or failure. No effusion or pneumothorax. Negative osseous structures.  IMPRESSION: Stable chest. No active disease.   Electronically Signed   By: Davonna Belling M.D.   On: 09/26/2013 01:43   Imaging Review No results found.  EKG Interpretation   None       MDM  No diagnosis found. 1. Hypoxia  His O2 saturation is mid-80's on room air, improved with oxygen. Albuterol nebulizer ordered. Patient care transferred to Dr. Elesa Massed for re-evaluation and disposition.     Arnoldo Hooker, PA-C 09/26/13 [phone]

## 2013-09-26 NOTE — H&P (Signed)
Francisco Collier is an 72 y.o. male.   Chief Complaint: Right-sided neck pain headache associated with shortness of breath HPI: Patient is 72 year old male with past medical history significant for coronary artery disease history of anteroseptal wall myocardial infarction in the past there is post PTCA stenting to LAD, hypertension, non-insulin-dependent diabetes mellitus controlled by diet, hypercholesteremia, remote tobacco abuse to pack a day for 40+ years quit approximately 15 years ago, GERD, degenerative joint disease, came to the ER by EMS complaining of right-sided neck pain headache associated with nausea and shortness of breath since yesterday. Patient denies any vomiting denies any blurring of vision denies any slurred speech denies any weakness in the arms or legs patient called EMS as his headache worsened and brought to the ER CT of the brain in the ER was negative for any bleed patient was noted to be hypoxic in the ED with O2 sats in 80s received breathing treatment with improvement in O2 sats above 92%. Patient denies any chest pain pressure tightness heaviness or discomfort. Denies any history of PND orthopnea leg swelling. Denies any palpitation lightheaded or syncopal episode. Denies any seizure activity. Denies any cough fever sore throat earache or sinus problems. Patient denies any prolonged immobilization or travel  Past Medical History  Diagnosis Date  . Kidney stone   . Hypertension   . S/P orchiectomy   . Diverticulitis   . Prostatic hypertrophy   . Gastric ulcer   . MI (myocardial infarction)   . CAD (coronary artery disease)   . Esophageal stricture   . Hyperlipemia     Past Surgical History  Procedure Laterality Date  . Appendectomy    . Angioplasty      Stent  . Thyroid surgery    . Cholecystectomy    . Cholecystectomy      Family History  Problem Relation Age of Onset  . Colon cancer Neg Hx   . Lung cancer Father    Social History:  reports that he has  quit smoking. He has never used smokeless tobacco. He reports that he does not drink alcohol or use illicit drugs.  Allergies:  Allergies  Allergen Reactions  . Codeine Nausea Only    Pill form causes nausea Iv form does not  . Niaspan [Niacin Er] Other (See Comments)    Severe flushing    Medications Prior to Admission  Medication Sig Dispense Refill  . albuterol (PROVENTIL HFA;VENTOLIN HFA) 108 (90 BASE) MCG/ACT inhaler Inhale 4 puffs into the lungs every 4 (four) hours as needed for wheezing.  8.5 g  0  . aspirin 81 MG tablet Take 81 mg by mouth daily after supper.       . esomeprazole (NEXIUM) 40 MG capsule Take 40 mg by mouth daily as needed (indigestion).       Marland Kitchen lisinopril (PRINIVIL,ZESTRIL) 10 MG tablet Take 10 mg by mouth daily after supper.       . metoprolol (TOPROL-XL) 50 MG 24 hr tablet Take 50 mg by mouth daily after supper.       . nitroGLYCERIN (NITROSTAT) 0.4 MG SL tablet Place 1 tablet (0.4 mg total) under the tongue every 5 (five) minutes as needed for chest pain.  25 tablet  12  . simvastatin (ZOCOR) 40 MG tablet Take 20 mg by mouth daily.        Results for orders placed during the hospital encounter of 09/25/13 (from the past 48 hour(s))  CBC WITH DIFFERENTIAL     Status:  Abnormal   Collection Time    09/25/13 11:36 PM      Result Value Range   WBC 6.1  4.0 - 10.5 K/uL   RBC 4.90  4.22 - 5.81 MIL/uL   Hemoglobin 13.8  13.0 - 17.0 g/dL   HCT 16.1  09.6 - 04.5 %   MCV 80.0  78.0 - 100.0 fL   MCH 28.2  26.0 - 34.0 pg   MCHC 35.2  30.0 - 36.0 g/dL   RDW 40.9  81.1 - 91.4 %   Platelets 212  150 - 400 K/uL   Neutrophils Relative % 78 (*) 43 - 77 %   Neutro Abs 4.8  1.7 - 7.7 K/uL   Lymphocytes Relative 13  12 - 46 %   Lymphs Abs 0.8  0.7 - 4.0 K/uL   Monocytes Relative 9  3 - 12 %   Monocytes Absolute 0.5  0.1 - 1.0 K/uL   Eosinophils Relative 0  0 - 5 %   Eosinophils Absolute 0.0  0.0 - 0.7 K/uL   Basophils Relative 0  0 - 1 %   Basophils Absolute 0.0   0.0 - 0.1 K/uL  BASIC METABOLIC PANEL     Status: Abnormal   Collection Time    09/25/13 11:36 PM      Result Value Range   Sodium 135  135 - 145 mEq/L   Potassium 4.0  3.5 - 5.1 mEq/L   Chloride 98  96 - 112 mEq/L   CO2 25  19 - 32 mEq/L   Glucose, Bld 106 (*) 70 - 99 mg/dL   BUN 17  6 - 23 mg/dL   Creatinine, Ser 7.82  0.50 - 1.35 mg/dL   Calcium 8.9  8.4 - 95.6 mg/dL   GFR calc non Af Amer 55 (*) >90 mL/min   GFR calc Af Amer 63 (*) >90 mL/min   Comment: (NOTE)     The eGFR has been calculated using the CKD EPI equation.     This calculation has not been validated in all clinical situations.     eGFR's persistently <90 mL/min signify possible Chronic Kidney     Disease.  URINALYSIS, ROUTINE W REFLEX MICROSCOPIC     Status: Abnormal   Collection Time    09/26/13  2:28 AM      Result Value Range   Color, Urine AMBER (*) YELLOW   Comment: BIOCHEMICALS MAY BE AFFECTED BY COLOR   APPearance CLEAR  CLEAR   Specific Gravity, Urine 1.027  1.005 - 1.030   pH 5.0  5.0 - 8.0   Glucose, UA NEGATIVE  NEGATIVE mg/dL   Hgb urine dipstick NEGATIVE  NEGATIVE   Bilirubin Urine SMALL (*) NEGATIVE   Ketones, ur 15 (*) NEGATIVE mg/dL   Protein, ur NEGATIVE  NEGATIVE mg/dL   Urobilinogen, UA 1.0  0.0 - 1.0 mg/dL   Nitrite NEGATIVE  NEGATIVE   Leukocytes, UA NEGATIVE  NEGATIVE   Comment: MICROSCOPIC NOT DONE ON URINES WITH NEGATIVE PROTEIN, BLOOD, LEUKOCYTES, NITRITE, OR GLUCOSE <1000 mg/dL.   Ct Head Wo Contrast  09/26/2013   CLINICAL DATA:  Headache with dizziness.  EXAM: CT HEAD WITHOUT CONTRAST  TECHNIQUE: Contiguous axial images were obtained from the base of the skull through the vertex without contrast.  COMPARISON:  MR brain 12/31/08.  FINDINGS: Moderate atrophy. Chronic microvascular ischemic change. No evidence for acute infarction, hemorrhage, mass lesion, hydrocephalus, or extra-axial fluid. Intact calvarium. No sinus or mastoid disease. Stable appearing  right infratemporal fossa  mass, incompletely evaluated.  IMPRESSION: No acute intracranial abnormality.  Chronic changes as described.   Electronically Signed   By: Davonna Belling M.D.   On: 09/26/2013 01:18   Dg Chest Portable 1 View  09/26/2013   CLINICAL DATA:  Headaches with shortness of breath and weakness.  EXAM: PORTABLE CHEST - 1 VIEW  COMPARISON:  05/08/2013.  FINDINGS: Normal cardiomediastinal silhouette. Slight chronic right hemidiaphragm elevation. No infiltrates or failure. No effusion or pneumothorax. Negative osseous structures.  IMPRESSION: Stable chest. No active disease.   Electronically Signed   By: Davonna Belling M.D.   On: 09/26/2013 01:43    Review of Systems  Constitutional: Negative for fever and chills.  HENT: Negative for ear discharge, ear pain, hearing loss and tinnitus.   Eyes: Negative for blurred vision, double vision and photophobia.  Respiratory: Positive for shortness of breath. Negative for cough, hemoptysis, sputum production and wheezing.   Cardiovascular: Negative for chest pain, palpitations, orthopnea and leg swelling.  Gastrointestinal: Positive for nausea. Negative for vomiting, abdominal pain and diarrhea.  Genitourinary: Negative for dysuria.  Neurological: Positive for dizziness and headaches.    Blood pressure 106/63, pulse 99, temperature 97.4 F (36.3 C), temperature source Oral, resp. rate 16, height 6\' 2"  (1.88 m), weight 100.426 kg (221 lb 6.4 oz), SpO2 94.00%. Physical Exam  Constitutional: He is oriented to person, place, and time.  HENT:  Head: Normocephalic and atraumatic.  Eyes: Conjunctivae are normal. Pupils are equal, round, and reactive to light. Left eye exhibits no discharge.  Neck: Normal range of motion. Neck supple. No JVD present. No tracheal deviation present. No thyromegaly present.  Cardiovascular: Normal rate and regular rhythm.  Exam reveals no friction rub.   Murmur (Soft systolic murmur noted no S3 gallop) heard. Respiratory:  Decreased breath  sound at bases no obvious wheezing noted now  GI: Soft. Bowel sounds are normal. He exhibits no distension. There is no tenderness. There is no rebound and no guarding.  Musculoskeletal: He exhibits no edema and no tenderness.  Neurological: He is alert and oriented to person, place, and time.     Assessment/Plan Exacerbation of COPD Uncontrolled hypertension Status post headache CAD history of anteroseptal wall myocardial infarction stable Diabetes mellitus controlled by diet Hypercholesteremia Remote tobacco abuse GERD Degenerative joint disease History of recent regional stricture in the past Plan As per orders   [facility] N 09/26/2013, 8:46 AM

## 2013-09-26 NOTE — ED Provider Notes (Signed)
Medical screening examination/treatment/procedure(s) were conducted as a shared visit with non-physician practitioner(s) and myself.  I personally evaluated the patient during the encounter   A 72 year old male with history of hypertension, CAD, hyperlipidemia, kidney stones who presents the emergency department with right-sided headache for the past 2 days. He also reports that he feels lightheaded with movement and standing. He has had nausea and vomiting. He also complains of blurred vision in his right eye. No fever, neck pain or neck stiffness. No history of head injury. No numbness, tingling or focal weakness. On exam, patient is tachypneic and hypoxic. He states he does not use oxygen at home. He has diminished breath sounds bilaterally. He is neurologically intact. Suspect patient's headache may be related to his hypoxia. Will give breathing treatments and steroids. His head CT and chest x-ray are unremarkable. Labs normal. No concern for meningitis, encephalitis. Low suspicion for intracranial hemorrhage or stroke given symptoms for 2 days with negative head CT. If patient still has oxygen requirement after breathing treatment, will need admission.   Patient's lungs are still diminished bilaterally after 3 duonebs.   His oxygen saturation at rest is 92% on remainder. Will ambulate. He reports his headache is still present and is not relieved with oxygen.  Will repeat his pain medication. Patient denies a thunderclap headache. Denies that he's had similar headaches in the past.      When patient ambulated, he becomes lightheaded/vertiginous/worsening blurry vision. His oxygen saturation however does not drop below 92% but he does become tachypneic and feels SOB.  Headache again completely resolved after IV morphine. His oxygen saturation is still fluctuating on room air and he is still hypoxic. Will admit to hospitalist for COPD exacerbation. PCP is Dr. Clarene Duke in [place].  Layla Maw  Ward, DO 09/26/13 [phone]

## 2013-09-26 NOTE — ED Notes (Signed)
Report given to 4 east unit nurse , transported in stable condition / denies chest pain / respirations unlabored / IV site unremarkable .

## 2013-09-26 NOTE — Progress Notes (Signed)
Utilization Review Completed.   Branon Sabine, RN, BSN Nurse Case Manager  [phone]  

## 2013-09-26 NOTE — Progress Notes (Signed)
Patient transported to 4E via stretcher.  Patient denies pain.  Bed alarm on.  RN will continue to monitor. Louretta Parma, RN

## 2013-09-26 NOTE — ED Notes (Signed)
Pt back from X-ray.  

## 2013-09-26 NOTE — ED Notes (Signed)
Oxygen saturation noted to be the lower 90's.  This RN went into room and saw nasal cannula off pt.  Informed pt that it is important to get cannula on.  Pt verbalized understanding.

## 2013-09-27 LAB — BASIC METABOLIC PANEL
CO2: 25 mEq/L (ref 19–32)
Calcium: 8.9 mg/dL (ref 8.4–10.5)
Chloride: 102 mEq/L (ref 96–112)
GFR calc Af Amer: >90 mL/min (ref >90)
Glucose, Bld: 166 mg/dL — ABNORMAL HIGH (ref 70–99)
Potassium: 4.3 mEq/L (ref 3.5–5.1)
Sodium: 137 mEq/L (ref 135–145)

## 2013-09-27 LAB — SEDIMENTATION RATE: Sed Rate: 8 mm/hr (ref 0–16)

## 2013-09-27 LAB — CBC
MCH: 27.5 pg (ref 26.0–34.0)
Platelets: 254 10*3/uL (ref 150–400)
RBC: 4.66 MIL/uL (ref 4.22–5.81)
WBC: 12.5 10*3/uL — ABNORMAL HIGH (ref 4.0–10.5)

## 2013-09-27 MED ORDER — IPRATROPIUM BROMIDE 0.02 % IN SOLN
0.5000 mg | Freq: Three times a day (TID) | RESPIRATORY_TRACT | Status: DC
  Administered 2013-09-27 – 2013-09-28 (×4): 0.5 mg via RESPIRATORY_TRACT
  Filled 2013-09-27 (×4): qty 2.5

## 2013-09-27 MED ORDER — METHYLPREDNISOLONE SODIUM SUCC 125 MG IJ SOLR
60.0000 mg | Freq: Two times a day (BID) | INTRAMUSCULAR | Status: DC
  Administered 2013-09-27: 60 mg via INTRAVENOUS
  Filled 2013-09-27 (×3): qty 0.96

## 2013-09-27 MED ORDER — ALBUTEROL SULFATE (5 MG/ML) 0.5% IN NEBU
2.5000 mg | INHALATION_SOLUTION | Freq: Three times a day (TID) | RESPIRATORY_TRACT | Status: DC
  Administered 2013-09-27 – 2013-09-28 (×4): 2.5 mg via RESPIRATORY_TRACT
  Filled 2013-09-27 (×4): qty 0.5

## 2013-09-27 NOTE — Progress Notes (Signed)
Pt resting on bed comfortable on RA, VSS, denies any SOB or pain at this time, no distress noticed.

## 2013-09-27 NOTE — Progress Notes (Signed)
Pt resting on bed comfortable, VSS, continues on 2L O2 [place] denies any pain or SOB. Pt refuses to have neb tx given at MN and 4 am refers is more important to him to sleep with out interruption at this time. We'll continue with POC.

## 2013-09-27 NOTE — Progress Notes (Signed)
Subjective:  Patient denies any chest pain states breathing has improved no further headaches  Objective:  Vital Signs in the last 24 hours: Temp:  [97 F (36.1 C)-97.4 F (36.3 C)] 97.4 F (36.3 C) (10/17 1100) Pulse Rate:  [78-104] 78 (10/17 1100) Resp:  [18-20] 20 (10/17 1100) BP: (99-115)/(56-86) 113/86 mmHg (10/17 1100) SpO2:  [96 %-98 %] 98 % (10/17 1100) Weight:  [100.472 kg (221 lb 8 oz)] 100.472 kg (221 lb 8 oz) (10/17 0513)  Intake/Output from previous day: 10/16 0701 - 10/17 0700 In: 1040 [P.O.:1040] Out: 1450 [Urine:1450] Intake/Output from this shift:    Physical Exam: Neck: no adenopathy, no carotid bruit, no JVD and supple, symmetrical, trachea midline Lungs: Decreased breath sound at bases Heart: regular rate and rhythm, S1, S2 normal and Soft systolic murmur noted no S3 gallop Abdomen: soft, non-tender; bowel sounds normal; no masses,  no organomegaly Extremities: extremities normal, atraumatic, no cyanosis or edema  Lab Results:  Recent Labs  09/26/13 1150 09/27/13 0420  WBC 3.8* 12.5*  HGB 12.9* 12.8*  PLT 245 254    Recent Labs  09/25/13 2336 09/26/13 1150 09/27/13 0420  NA 135  --  137  K 4.0  --  4.3  CL 98  --  102  CO2 25  --  25  GLUCOSE 106*  --  166*  BUN 17  --  16  CREATININE 1.29 1.15 0.97   No results found for this basename: TROPONINI, CK, MB,  in the last 72 hours Hepatic Function Panel No results found for this basename: PROT, ALBUMIN, AST, ALT, ALKPHOS, BILITOT, BILIDIR, IBILI,  in the last 72 hours No results found for this basename: CHOL,  in the last 72 hours No results found for this basename: PROTIME,  in the last 72 hours  Imaging: Imaging results have been reviewed and Ct Head Wo Contrast  09/26/2013   CLINICAL DATA:  Headache with dizziness.  EXAM: CT HEAD WITHOUT CONTRAST  TECHNIQUE: Contiguous axial images were obtained from the base of the skull through the vertex without contrast.  COMPARISON:  MR brain  12/31/08.  FINDINGS: Moderate atrophy. Chronic microvascular ischemic change. No evidence for acute infarction, hemorrhage, mass lesion, hydrocephalus, or extra-axial fluid. Intact calvarium. No sinus or mastoid disease. Stable appearing right infratemporal fossa mass, incompletely evaluated.  IMPRESSION: No acute intracranial abnormality.  Chronic changes as described.   Electronically Signed   By: Davonna Belling M.D.   On: 09/26/2013 01:18   Dg Chest Portable 1 View  09/26/2013   CLINICAL DATA:  Headaches with shortness of breath and weakness.  EXAM: PORTABLE CHEST - 1 VIEW  COMPARISON:  05/08/2013.  FINDINGS: Normal cardiomediastinal silhouette. Slight chronic right hemidiaphragm elevation. No infiltrates or failure. No effusion or pneumothorax. Negative osseous structures.  IMPRESSION: Stable chest. No active disease.   Electronically Signed   By: Davonna Belling M.D.   On: 09/26/2013 01:43    Cardiac Studies:  Assessment/Plan:  Resolving Exacerbation of COPD  Uncontrolled hypertension  Status post headache  CAD history of anteroseptal wall myocardial infarction stable  Diabetes mellitus controlled by diet  Hypercholesteremia  Remote tobacco abuse  GERD  Degenerative joint disease  History of recent regional stricture in the past Plan Reduce Solu-Medrol as per orders Possible discharge tomorrow if stable  LOS: 2 days    Jaki Steptoe N 09/27/2013, 12:16 PM

## 2013-09-28 MED ORDER — PREDNISONE 10 MG PO TABS: 20.0000 mg | ORAL_TABLET | Freq: Every day | ORAL | Status: AC

## 2013-09-28 MED ORDER — BUDESONIDE-FORMOTEROL FUMARATE 160-4.5 MCG/ACT IN AERO: 2.0000 | INHALATION_SPRAY | Freq: Two times a day (BID) | RESPIRATORY_TRACT | Status: DC

## 2013-09-28 NOTE — Discharge Summary (Signed)
NAME:  Francisco Collier, Francisco Collier               ACCOUNT NO.:  [id number]  MEDICAL RECORD NO.:  [id number]  LOCATION:  4E12C                        FACILITY:  [facility]  PHYSICIAN:  Tawnia Schirm N. Sharyn Lull, M.D. DATE OF BIRTH:  02-Aug-1941  DATE OF ADMISSION:  09/25/2013 DATE OF DISCHARGE:  09/28/2013                              DISCHARGE SUMMARY   ADMITTING DIAGNOSES: 1. Exacerbation of chronic obstructive pulmonary disease. 2. Uncontrolled hypertension. 3. Status post headaches: 4. Coronary artery disease, history of anteroseptal wall myocardial     infarction in the past,  stable. 5. Diabetes mellitus controlled by diet. 6. Hypercholesteremia. 7. Remote tobacco abuse. 8. Gastroesophageal reflux disease. 9. Degenerative joint disease. 10.History of esophageal stricture in the past.  DISCHARGE DIAGNOSES: 1. Status post exacerbation of chronic obstructive pulmonary disease. 2. Hypertension. 3. Coronary artery disease, history of anteroseptal wall myocardial     infarction in the past,  stable. 4. Diabetes mellitus controlled by diet. 5. Hypercholesteremia. 6. Remote tobacco abuse. 7. Gastroesophageal reflux disease. 8. Degenerative joint disease. 9. History of esophageal stricture in the past.  DISCHARGE HOME MEDICATIONS: 1. Symbicort 160/4.5 mcg per inhalation 2 puffs twice daily. 2. Prednisone 20 mg 1 tablet daily for 7 days and then 10 mg daily for     1 week. 3. Albuterol inhaler q.4 hours as before. 4. Aspirin 81 mg 1 tablet daily. 5. Nexium 40 mg 1 capsule daily. 6. Lisinopril 10 mg daily. 7. Metoprolol succinate 50 mg 1 tablet daily. 8. Nitrostat 0.4 mg sublingual use as directed. 9. Simvastatin 40 mg 1 tablet daily.  DIET:  Low salt, low cholesterol.  ACTIVITY:  As tolerated.  CONDITION AT DISCHARGE:  Stable.  FOLLOWUP:  Follow up with me in 1 week.  BRIEF HISTORY AND HOSPITAL COURSE:  Mr. Weninger is 72 year old male with past medical history significant for coronary artery  disease, history of anteroseptal wall myocardial infarction in the past, status post PTCA and stenting to LAD, hypertension, non-insulin-dependent diabetes mellitus controlled by diet, hypercholesteremia, remote tobacco abuse two pack per day for 40+ years, quit approximately 15 years ago, GERD, degenerative joint disease, history of esophageal stricture in the past, came to the ER complaining of right-sided neck pain, headache associated with nausea and shortness of breath since yesterday.  The patient denies any vomiting, denies any blurring of vision.  Denies any slurred speech.  Denies weakness in the arms or legs.  He called EMS as his headache worsened and brought to the ER.  CT of the brain in the ER was negative for any bleed and was noted to be hypoxic in the ED with O2 sats in the 80s.  The patient received breathing treatments and Solu- Medrol with improvement in O2 sats above 92% on 2 L of O2.  The patient denies any chest pain, tightness, heaviness, or discomfort.  Denies any history of PND, orthopnea, leg swelling.  Denies palpitation, lightheadedness, or syncope.  Denies any seizure activity.  Denies cough, fever, or sore throat, ear ache or sinus problems.  The patient denies any prolonged immobilization or recent travel.  PAST MEDICAL HISTORY:  As above.  PAST SURGICAL HISTORY:  He had appendectomy in the past, had thyroid  surgery in the past, had cholecystectomy in the past and angioplasty and stenting to LAD in the past.  PHYSICAL EXAMINATION:  GENERAL:  When seen in the ER, he was alert, awake, oriented. VITAL SIGNS:  Blood pressure was down to 106/63, pulse was 99.  He was afebrile. EYES:  Conjunctiva was pink. NECK:  Supple.  No JVD.  No bruit. LUNGS:  He has decreased breath sounds at bases with no obvious wheezing noted.  The patient did receive bronchodilators and IV steroids. CARDIOVASCULAR:  S1, S2 was normal.  There was soft systolic murmur.  No S3  gallop. ABDOMEN:  Soft.  Bowel sounds were present.  Nontender. EXTREMITIES:  There is no clubbing, cyanosis, or edema. NEUROLOGIC:  Grossly intact.  LABORATORY DATA:  Sodium was 135, potassium 4.0, BUN 17, creatinine 1.29.  Hemoglobin was 13.8, hematocrit 39.2, white count of 6.1.  CT of the brain showed no acute intracranial abnormality, chronic ischemic changes noted.  Chest x-ray was stable.  No acute disease noted.  BRIEF HOSPITAL COURSE:  The patient was admitted to telemetry unit.  The patient was started on IV steroids and bronchodilators with improvement in his symptoms.  The patient's steroid is slowly being weaned off and switched to prednisone.  The patient has been ambulating in room without any problems.  His breathing has markedly improved.  His wheezing is completely resolved.  The patient will be discharged home on above medications and will be followed up in my office in 1 week.     Eduardo Osier. Sharyn Lull, M.D.     [facility]  D:  09/28/2013  T:  09/28/2013  Job:  [id number]

## 2013-09-28 NOTE — Discharge Summary (Signed)
  Discharge summary dictated on 09/28/2013 dictation number is [phone]

## 2014-05-10 ENCOUNTER — Encounter: Payer: Self-pay | Admitting: Gastroenterology

## 2014-05-18 ENCOUNTER — Emergency Department (HOSPITAL_COMMUNITY)
Admission: EM | Admit: 2014-05-18 | Discharge: 2014-05-18 | Disposition: A | Discharge status: Home / Self Care | Payer: Medicare Other | Attending: Emergency Medicine | Admitting: Emergency Medicine

## 2014-05-18 ENCOUNTER — Emergency Department (HOSPITAL_COMMUNITY): Payer: Medicare Other

## 2014-05-18 ENCOUNTER — Encounter (HOSPITAL_COMMUNITY): Payer: Self-pay | Admitting: Emergency Medicine

## 2014-05-18 DIAGNOSIS — J449 Chronic obstructive pulmonary disease, unspecified: Secondary | ICD-10-CM | POA: Diagnosis not present

## 2014-05-18 DIAGNOSIS — K219 Gastro-esophageal reflux disease without esophagitis: Secondary | ICD-10-CM | POA: Diagnosis not present

## 2014-05-18 DIAGNOSIS — Z9861 Coronary angioplasty status: Secondary | ICD-10-CM | POA: Insufficient documentation

## 2014-05-18 DIAGNOSIS — Z87442 Personal history of urinary calculi: Secondary | ICD-10-CM | POA: Insufficient documentation

## 2014-05-18 DIAGNOSIS — I10 Essential (primary) hypertension: Secondary | ICD-10-CM | POA: Diagnosis not present

## 2014-05-18 DIAGNOSIS — E785 Hyperlipidemia, unspecified: Secondary | ICD-10-CM | POA: Diagnosis not present

## 2014-05-18 DIAGNOSIS — Z87891 Personal history of nicotine dependence: Secondary | ICD-10-CM | POA: Insufficient documentation

## 2014-05-18 DIAGNOSIS — Z87448 Personal history of other diseases of urinary system: Secondary | ICD-10-CM | POA: Insufficient documentation

## 2014-05-18 DIAGNOSIS — Z79899 Other long term (current) drug therapy: Secondary | ICD-10-CM | POA: Insufficient documentation

## 2014-05-18 DIAGNOSIS — R51 Headache: Secondary | ICD-10-CM | POA: Insufficient documentation

## 2014-05-18 DIAGNOSIS — M129 Arthropathy, unspecified: Secondary | ICD-10-CM | POA: Diagnosis not present

## 2014-05-18 DIAGNOSIS — Z7982 Long term (current) use of aspirin: Secondary | ICD-10-CM | POA: Insufficient documentation

## 2014-05-18 DIAGNOSIS — J4489 Other specified chronic obstructive pulmonary disease: Secondary | ICD-10-CM | POA: Insufficient documentation

## 2014-05-18 DIAGNOSIS — I2510 Atherosclerotic heart disease of native coronary artery without angina pectoris: Secondary | ICD-10-CM | POA: Diagnosis not present

## 2014-05-18 DIAGNOSIS — I252 Old myocardial infarction: Secondary | ICD-10-CM | POA: Insufficient documentation

## 2014-05-18 DIAGNOSIS — R519 Headache, unspecified: Secondary | ICD-10-CM

## 2014-05-18 MED ORDER — MORPHINE SULFATE 4 MG/ML IJ SOLN
4.0000 mg | Freq: Once | INTRAMUSCULAR | Status: AC
  Administered 2014-05-18: 4 mg via INTRAMUSCULAR
  Filled 2014-05-18: qty 1

## 2014-05-18 MED ORDER — METOCLOPRAMIDE HCL 5 MG/ML IJ SOLN
10.0000 mg | Freq: Once | INTRAMUSCULAR | Status: AC
  Administered 2014-05-18: 10 mg via INTRAMUSCULAR
  Filled 2014-05-18: qty 2

## 2014-05-18 MED ORDER — OXYCODONE-ACETAMINOPHEN 5-325 MG PO TABS: 1.0000 | ORAL_TABLET | ORAL | Status: DC | PRN

## 2014-05-18 MED ORDER — DIPHENHYDRAMINE HCL 50 MG/ML IJ SOLN
25.0000 mg | Freq: Once | INTRAMUSCULAR | Status: AC
  Administered 2014-05-18: 25 mg via INTRAVENOUS
  Filled 2014-05-18: qty 1

## 2014-05-18 NOTE — Discharge Instructions (Signed)

## 2014-05-18 NOTE — ED Notes (Signed)
Per pt report: pt c/o of a severe headache that began around 3am today.  Pt reports taking an unknown pain pill that was given to him for his back but the pill provided minimal relief.  Pt a/o x 4. Pt reports having nausea but denies vomiting, SOB, or being sensitive to light or noise. Pt ambulatory in triage.

## 2014-05-18 NOTE — ED Provider Notes (Signed)
CSN: [id number]     Arrival date & time 05/18/14  1927 History   First MD Initiated Contact with Patient 05/18/14 2012     Chief Complaint  Patient presents with  . Headache     (Consider location/radiation/quality/duration/timing/severity/associated sxs/prior Treatment) Patient is a 73 y.o. male presenting with headaches. The history is provided by the patient and the spouse.  Headache Pain location:  Frontal Quality:  Dull Severity currently:  5/10 Severity at highest:  5/10 Onset quality:  Sudden Duration:  20 hours Timing:  Constant Progression:  Waxing and waning Chronicity:  New Similar to prior headaches: no   Context: not exposure to bright light and not loud noise   Relieved by:  Nothing Worsened by:  Nothing tried Ineffective treatments:  Prescription medications Associated symptoms: no fever, no focal weakness, no loss of balance, no neck pain, no neck stiffness, no numbness, no paresthesias, no seizures, no visual change and no vomiting     Past Medical History  Diagnosis Date  . Kidney stone   . Hypertension   . S/P orchiectomy   . Diverticulitis   . Prostatic hypertrophy   . Gastric ulcer   . MI (myocardial infarction)   . CAD (coronary artery disease)   . Esophageal stricture   . Hyperlipemia   . COPD (chronic obstructive pulmonary disease)   . Shortness of breath   . GERD (gastroesophageal reflux disease)   . Arthritis    Past Surgical History  Procedure Laterality Date  . Appendectomy    . Angioplasty      Stent  . Thyroid surgery    . Cholecystectomy    . Cholecystectomy    . Coronary angioplasty    . Back surgery     Family History  Problem Relation Age of Onset  . Colon cancer Neg Hx   . Lung cancer Father    History  Substance Use Topics  . Smoking status: Former Smoker -- 60 years    Quit date: 12/12/1998  . Smokeless tobacco: Never Used  . Alcohol Use: No    Review of Systems  Constitutional: Negative for fever.   Gastrointestinal: Negative for vomiting.  Musculoskeletal: Negative for neck pain and neck stiffness.  Neurological: Positive for headaches. Negative for focal weakness, seizures, numbness, paresthesias and loss of balance.  All other systems reviewed and are negative.     Allergies  Codeine and Niaspan  Home Medications   Prior to Admission medications   Medication Sig Start Date End Date Taking? Authorizing Provider  aspirin 81 MG tablet Take 81 mg by mouth daily after supper.    Yes Historical Provider, MD  budesonide-formoterol (SYMBICORT) 160-4.5 MCG/ACT inhaler Inhale 2 puffs into the lungs 2 (two) times daily. 09/28/13  Yes Clent Demark, MD  esomeprazole (NEXIUM) 40 MG capsule Take 40 mg by mouth daily as needed (indigestion).    Yes Historical Provider, MD  lisinopril (PRINIVIL,ZESTRIL) 10 MG tablet Take 10 mg by mouth daily after supper.    Yes Historical Provider, MD  metoprolol (TOPROL-XL) 50 MG 24 hr tablet Take 50 mg by mouth daily after supper.    Yes Historical Provider, MD  nitroGLYCERIN (NITROSTAT) 0.4 MG SL tablet Place 1 tablet (0.4 mg total) under the tongue every 5 (five) minutes as needed for chest pain. 05/03/13  Yes Clent Demark, MD  pravastatin (PRAVACHOL) 40 MG tablet Take 40 mg by mouth daily.   Yes Historical Provider, MD  PRESCRIPTION MEDICATION Take 1 tablet by  mouth daily. Prescription arthritis medication.   Yes Historical Provider, MD   BP 159/91  Pulse 88  Temp(Src) 98.1 F (36.7 C) (Oral)  Resp 20  SpO2 100% Physical Exam  Nursing note and vitals reviewed. Constitutional: He is oriented to person, place, and time. He appears well-developed and well-nourished.  Non-toxic appearance. No distress.  HENT:  Head: Normocephalic and atraumatic.  Eyes: Conjunctivae, EOM and lids are normal. Pupils are equal, round, and reactive to light.  Neck: Normal range of motion. Neck supple. No tracheal deviation present. No mass present.  Cardiovascular:  Normal rate, regular rhythm and normal heart sounds.  Exam reveals no gallop.   No murmur heard. Pulmonary/Chest: Effort normal and breath sounds normal. No stridor. No respiratory distress. He has no decreased breath sounds. He has no wheezes. He has no rhonchi. He has no rales.  Abdominal: Soft. Normal appearance and bowel sounds are normal. He exhibits no distension. There is no tenderness. There is no rebound and no CVA tenderness.  Musculoskeletal: Normal range of motion. He exhibits no edema and no tenderness.  Neurological: He is alert and oriented to person, place, and time. He has normal strength. No cranial nerve deficit or sensory deficit. GCS eye subscore is 4. GCS verbal subscore is 5. GCS motor subscore is 6.  Skin: Skin is warm and dry. No abrasion and no rash noted.  Psychiatric: He has a normal mood and affect. His speech is normal and behavior is normal.    ED Course  Procedures (including critical care time) Labs Review Labs Reviewed - No data to display  Imaging Review Ct Head Wo Contrast  05/18/2014   CLINICAL DATA:  Headache, weakness  EXAM: CT HEAD WITHOUT CONTRAST  TECHNIQUE: Contiguous axial images were obtained from the base of the skull through the vertex without contrast.  COMPARISON:  09/26/2013.  FINDINGS: Mild atrophy. Mild chronic microvascular ischemic change. Bifrontal atrophy versus small left greater than right hygromas, stable.  No evidence for acute infarction, hemorrhage, mass lesion, hydrocephalus, or extra-axial fluid. Calvarium intact. Vascular calcification. No acute sinus or mastoid disease. 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  IMPRESSION: No acute intracranial abnormality.   Electronically Signed   By: Rolla Flatten M.D.   On: 05/18/2014 21:11     EKG Interpretation None      MDM   Final diagnoses:  None    Patient given medications for headache and feels better. Head CT doesn't show any acute  abnormalities. Repeat neurological assessment stable at time of discharge    Leota Jacobsen, MD 05/18/14 2157

## 2014-07-06 ENCOUNTER — Encounter (HOSPITAL_COMMUNITY): Payer: Self-pay | Admitting: Emergency Medicine

## 2014-07-06 ENCOUNTER — Emergency Department (HOSPITAL_COMMUNITY)
Admission: EM | Admit: 2014-07-06 | Discharge: 2014-07-06 | Disposition: A | Discharge status: Home / Self Care | Payer: Medicare Other | Attending: Emergency Medicine | Admitting: Emergency Medicine

## 2014-07-06 DIAGNOSIS — E785 Hyperlipidemia, unspecified: Secondary | ICD-10-CM | POA: Diagnosis not present

## 2014-07-06 DIAGNOSIS — K219 Gastro-esophageal reflux disease without esophagitis: Secondary | ICD-10-CM | POA: Insufficient documentation

## 2014-07-06 DIAGNOSIS — I252 Old myocardial infarction: Secondary | ICD-10-CM | POA: Diagnosis not present

## 2014-07-06 DIAGNOSIS — Z9861 Coronary angioplasty status: Secondary | ICD-10-CM | POA: Diagnosis not present

## 2014-07-06 DIAGNOSIS — R110 Nausea: Secondary | ICD-10-CM | POA: Diagnosis not present

## 2014-07-06 DIAGNOSIS — Z7982 Long term (current) use of aspirin: Secondary | ICD-10-CM | POA: Insufficient documentation

## 2014-07-06 DIAGNOSIS — Z87448 Personal history of other diseases of urinary system: Secondary | ICD-10-CM | POA: Diagnosis not present

## 2014-07-06 DIAGNOSIS — R42 Dizziness and giddiness: Secondary | ICD-10-CM | POA: Diagnosis not present

## 2014-07-06 DIAGNOSIS — Z87891 Personal history of nicotine dependence: Secondary | ICD-10-CM | POA: Diagnosis not present

## 2014-07-06 DIAGNOSIS — R51 Headache: Secondary | ICD-10-CM | POA: Insufficient documentation

## 2014-07-06 DIAGNOSIS — R519 Headache, unspecified: Secondary | ICD-10-CM

## 2014-07-06 DIAGNOSIS — I10 Essential (primary) hypertension: Secondary | ICD-10-CM | POA: Insufficient documentation

## 2014-07-06 DIAGNOSIS — Z79899 Other long term (current) drug therapy: Secondary | ICD-10-CM | POA: Diagnosis not present

## 2014-07-06 DIAGNOSIS — M129 Arthropathy, unspecified: Secondary | ICD-10-CM | POA: Diagnosis not present

## 2014-07-06 DIAGNOSIS — I2510 Atherosclerotic heart disease of native coronary artery without angina pectoris: Secondary | ICD-10-CM | POA: Insufficient documentation

## 2014-07-06 DIAGNOSIS — J449 Chronic obstructive pulmonary disease, unspecified: Secondary | ICD-10-CM | POA: Diagnosis not present

## 2014-07-06 DIAGNOSIS — Z87442 Personal history of urinary calculi: Secondary | ICD-10-CM | POA: Insufficient documentation

## 2014-07-06 DIAGNOSIS — IMO0002 Reserved for concepts with insufficient information to code with codable children: Secondary | ICD-10-CM | POA: Insufficient documentation

## 2014-07-06 DIAGNOSIS — J4489 Other specified chronic obstructive pulmonary disease: Secondary | ICD-10-CM | POA: Insufficient documentation

## 2014-07-06 DIAGNOSIS — H53149 Visual discomfort, unspecified: Secondary | ICD-10-CM | POA: Diagnosis not present

## 2014-07-06 MED ORDER — METOCLOPRAMIDE HCL 5 MG/ML IJ SOLN
10.0000 mg | Freq: Once | INTRAMUSCULAR | Status: AC
  Administered 2014-07-06: 10 mg via INTRAVENOUS
  Filled 2014-07-06: qty 2

## 2014-07-06 MED ORDER — ACETAMINOPHEN 325 MG PO TABS
650.0000 mg | ORAL_TABLET | Freq: Once | ORAL | Status: AC
  Administered 2014-07-06: 650 mg via ORAL
  Filled 2014-07-06: qty 2

## 2014-07-06 MED ORDER — SODIUM CHLORIDE 0.9 % IV BOLUS (SEPSIS)
500.0000 mL | Freq: Once | INTRAVENOUS | Status: AC
  Administered 2014-07-06: 500 mL via INTRAVENOUS

## 2014-07-06 MED ORDER — DEXAMETHASONE SODIUM PHOSPHATE 10 MG/ML IJ SOLN
10.0000 mg | Freq: Once | INTRAMUSCULAR | Status: AC
  Administered 2014-07-06: 10 mg via INTRAVENOUS
  Filled 2014-07-06: qty 1

## 2014-07-06 MED ORDER — KETOROLAC TROMETHAMINE 30 MG/ML IJ SOLN
30.0000 mg | Freq: Once | INTRAMUSCULAR | Status: AC
  Administered 2014-07-06: 30 mg via INTRAVENOUS
  Filled 2014-07-06: qty 1

## 2014-07-06 MED ORDER — DIPHENHYDRAMINE HCL 50 MG/ML IJ SOLN
25.0000 mg | Freq: Once | INTRAMUSCULAR | Status: AC
  Administered 2014-07-06: 25 mg via INTRAVENOUS
  Filled 2014-07-06: qty 1

## 2014-07-06 NOTE — ED Notes (Addendum)
Pt reports taking half a tablet of Oxycodone around 0700 this a.m. Without relief.  His headache starts in the back of head and is also present in forehead. He states "it's sore too". Denies blurred or double vision but reports dizziness when he turns around and nausea without vomiting. Pt has steady gait and no deficits on NIH scale.

## 2014-07-06 NOTE — Discharge Instructions (Signed)
General Headache A headache is pain or discomfort felt around the head or neck area. The specific cause of a headache may not be found. There are many causes and types of headaches. A few common ones are:  Tension headaches.  Migraine headaches.  Cluster headaches.  Chronic daily headaches. HOME CARE INSTRUCTIONS   Keep all follow-up appointments with your caregiver or any specialist referral.  Only take over-the-counter or prescription medicines for pain or discomfort as directed by your caregiver.  Lie down in a dark, quiet room when you have a headache.  Keep a headache journal to find out what may trigger your migraine headaches. For example, write down:  What you eat and drink.  How much sleep you get.  Any change to your diet or medicines.  Try massage or other relaxation techniques.  Put ice packs or heat on the head and neck. Use these 3 to 4 times per day for 15 to 20 minutes each time, or as needed.  Limit stress.  Sit up straight, and do not tense your muscles.  Quit smoking if you smoke.  Limit alcohol use.  Decrease the amount of caffeine you drink, or stop drinking caffeine.  Eat and sleep on a regular schedule.  Get 7 to 9 hours of sleep, or as recommended by your caregiver.  Keep lights dim if bright lights bother you and make your headaches worse. SEEK MEDICAL CARE IF:   You have problems with the medicines you were prescribed.  Your medicines are not working.  You have a change from the usual headache.  You have nausea or vomiting. SEEK IMMEDIATE MEDICAL CARE IF:   Your headache becomes severe.  You have a fever.  You have a stiff neck.  You have loss of vision.  You have muscular weakness or loss of muscle control.  You start losing your balance or have trouble walking.  You feel faint or pass out.  You have severe symptoms that are different from your first symptoms. MAKE SURE YOU:   Understand these instructions.  Will  watch your condition.  Will get help right away if you are not doing well or get worse. Document Released: 11/28/2005 Document Revised: 02/20/2012 Document Reviewed: 12/14/2011 [facility] Patient Information 2015 [place], [place]. This information is not intended to replace advice given to you by your health care provider. Make sure you discuss any questions you have with your health care provider.

## 2014-07-06 NOTE — ED Provider Notes (Signed)
CSN: [id number]     Arrival date & time 07/06/14  2542 History   First MD Initiated Contact with Patient 07/06/14 [phone]     Chief Complaint  Patient presents with  . Headache   HPI Comments: Patient states that he also has some dizziness when he is standing or walking.  Patient had the same complaint when he was seen here on 05/18/14.  Patient is a 73 y.o. male presenting with headaches. The history is provided by the patient. No language interpreter was used.  Headache Pain location:  L temporal and R temporal Quality:  Stabbing Severity currently:  8/10 Severity at highest:  8/10 Duration:  4 days Timing:  Constant Progression:  Unchanged Chronicity:  New (Previous history of headache similar to this one 2 months ago) Similar to prior headaches: yes   Context: bright light and loud noise   Context: not activity, not caffeine, not coughing, not defecating, not eating, not stress, not exposure to cold air and not intercourse   Relieved by:  Nothing Worsened by:  Light and sound Ineffective treatments:  Acetaminophen and prescription medications (tried 1/2 oxycodone pill) Associated symptoms: dizziness, nausea and photophobia   Associated symptoms: no abdominal pain, no back pain, no blurred vision, no congestion, no cough, no diarrhea, no drainage, no ear pain, no pain, no facial pain, no fatigue, no fever, no focal weakness, no hearing loss, no loss of balance, no neck pain, no neck stiffness, no numbness, no paresthesias, no swollen glands, no tingling, no visual change, no vomiting and no weakness   Risk factors: no anger, no family hx of SAH, does not have insomnia and lifestyle not sedentary     Past Medical History  Diagnosis Date  . Kidney stone   . Hypertension   . S/P orchiectomy   . Diverticulitis   . Prostatic hypertrophy   . Gastric ulcer   . MI (myocardial infarction)   . CAD (coronary artery disease)   . Esophageal stricture   . Hyperlipemia   . COPD (chronic  obstructive pulmonary disease)   . Shortness of breath   . GERD (gastroesophageal reflux disease)   . Arthritis    Past Surgical History  Procedure Laterality Date  . Appendectomy    . Angioplasty      Stent  . Thyroid surgery    . Cholecystectomy    . Cholecystectomy    . Coronary angioplasty    . Back surgery     Family History  Problem Relation Age of Onset  . Colon cancer Neg Hx   . Lung cancer Father    History  Substance Use Topics  . Smoking status: Former Smoker -- 13 years    Quit date: 12/12/1998  . Smokeless tobacco: Never Used  . Alcohol Use: No    Review of Systems  Constitutional: Negative for fever and fatigue.  HENT: Negative for congestion, ear pain, hearing loss and postnasal drip.   Eyes: Positive for photophobia. Negative for blurred vision and pain.  Respiratory: Negative for cough.   Gastrointestinal: Positive for nausea. Negative for vomiting, abdominal pain and diarrhea.  Musculoskeletal: Negative for back pain, neck pain and neck stiffness.  Neurological: Positive for dizziness and headaches. Negative for focal weakness, numbness, paresthesias and loss of balance.  All other systems reviewed and are negative.     Allergies  Codeine and Niaspan  Home Medications   Prior to Admission medications   Medication Sig Start Date End Date Taking? Authorizing Provider  aspirin 81 MG tablet Take 81 mg by mouth daily after supper.    Yes Historical Provider, MD  budesonide-formoterol (SYMBICORT) 160-4.5 MCG/ACT inhaler Inhale 2 puffs into the lungs 2 (two) times daily. 09/28/13  Yes Clent Demark, MD  Choline Fenofibrate 135 MG capsule Take 135 mg by mouth daily.   Yes Historical Provider, MD  esomeprazole (NEXIUM) 40 MG capsule Take 40 mg by mouth daily as needed (indigestion).    Yes Historical Provider, MD  gemfibrozil (LOPID) 600 MG tablet Take 600 mg by mouth 2 (two) times daily before a meal.   Yes Historical Provider, MD   Hyprom-Naphaz-Polysorb-Zn Sulf (CLEAR EYES COMPLETE OP) Apply 1 drop to eye daily as needed (drye eyes.).   Yes Historical Provider, MD  lisinopril (PRINIVIL,ZESTRIL) 10 MG tablet Take 10 mg by mouth daily after supper.    Yes Historical Provider, MD  metoprolol (TOPROL-XL) 50 MG 24 hr tablet Take 50 mg by mouth daily after supper.    Yes Historical Provider, MD  nitroGLYCERIN (NITROSTAT) 0.4 MG SL tablet Place 1 tablet (0.4 mg total) under the tongue every 5 (five) minutes as needed for chest pain. 05/03/13  Yes Clent Demark, MD  oxyCODONE-acetaminophen (PERCOCET/ROXICET) 5-325 MG per tablet Take 1-2 tablets by mouth every 4 (four) hours as needed for severe pain. 05/18/14  Yes Leota Jacobsen, MD  pravastatin (PRAVACHOL) 40 MG tablet Take 20 mg by mouth daily.    Yes Historical Provider, MD   BP 160/86  Pulse 80  Temp(Src) 98 F (36.7 C) (Oral)  Resp 18  SpO2 100% Physical Exam  Nursing note and vitals reviewed. Constitutional: He is oriented to person, place, and time. He appears well-developed and well-nourished. No distress.  HENT:  Head: Normocephalic and atraumatic.  Mouth/Throat: Oropharynx is clear and moist. No oropharyngeal exudate.  Eyes: Conjunctivae and EOM are normal. Pupils are equal, round, and reactive to light. No scleral icterus.  Neck: Normal range of motion. Neck supple. No JVD present. No Brudzinski's sign and no Kernig's sign noted. No thyromegaly present.  Cardiovascular: Normal rate, regular rhythm, normal heart sounds and intact distal pulses.   Pulmonary/Chest: Effort normal and breath sounds normal. No respiratory distress. He has no wheezes. He has no rales. He exhibits no tenderness.  Abdominal: Soft. Bowel sounds are normal. He exhibits no distension and no mass. There is no tenderness. There is no rebound and no guarding.  Musculoskeletal: Normal range of motion.  Lymphadenopathy:    He has no cervical adenopathy.  Neurological: He is alert and oriented  to person, place, and time. He has normal strength. No cranial nerve deficit or sensory deficit. Coordination normal.  Normal finger to nose, negative pronator drift.  Skin: Skin is warm and dry. He is not diaphoretic.  Psychiatric: He has a normal mood and affect. His behavior is normal. Judgment and thought content normal.    ED Course  Procedures (including critical care time) Labs Review Labs Reviewed - No data to display  Imaging Review No results found.   EKG Interpretation None      MDM   Final diagnoses:  Nonintractable headache, unspecified chronicity pattern, unspecified headache type   Patient is a 73 y.o. Male who presents to the ED with headache x 4 days.  Patient states that the headache is similar to previous headaches in the past.  Patient has been seen here for the same x 3.  Patient was last seen 05/18/14.  Patient had a CT scan  of the head performed at that time which show no acute intracranial abnormalities.  Patient is afebrile here in the ED at this time.  There are no meningeal signs at this time and patient does not have any focal neuro deficits at this time.  Patient was treated here with a headache cocktail of reglan, decadron, benadryl, and toradol, and NS bolus of 500 mL.  Patient states that after headache cocktail his headache is relieved.  He denies any nausea at this time.   Patient attempted to ambulate here in the ED but was unable to due to dizziness.  Patient developed pain approximately 1 hour after headache cocktail was finished.    Patient was able to walk to the bathroom without issue.  Patient states that he is having a mild return of his headache.  I have given him tylenol at this time and advised him to go home and rest.  He was told to return for fever, worsening neck stiffness and pain, visual changes, weakness, and loss of balance.  He states his understanding.  He was encouraged to keep a headache diary at this time and to also speak with his  PCP about his headaches and his high blood pressure.  He states understanding and agreement.     Patient is stable for discharge at this time.  Patient was seen and examined by Dr. Eulis Foster who agrees with the above treatment plan.       Cherylann Parr, PA-C 07/06/14 1232

## 2014-07-06 NOTE — ED Notes (Addendum)
Pt reports he had a headache around 2 months ago. Pt reports headache started 4 days ago. sensativity to sound, reports slight dizziness. Pain 8/10. Nausea present. Denies v/d. Denies trauma or injury. Pt reports he took 1/2 a pill of the medicine he was prescribed last time he was in ED for headache.

## 2014-07-06 NOTE — ED Provider Notes (Signed)
  Face-to-face evaluation   History: Alert, cooperative, elderly man. He, states he isn't improved after treatment with medications in the emergency department. His headache started gradually several days ago, on the right side of his neck. He, states that he is "full of arthritis."  Physical exam: Alert, elderly man, cooperative. Neck has somewhat limited flexion and lateral movement, secondary to pain. Neurologic normal strength and sensation bilateral extremities.  Medical screening examination/treatment/procedure(s) were conducted as a shared visit with non-physician practitioner(s) and myself.  I personally evaluated the patient during the encounter  Richarda Blade, MD 07/07/14 1158

## 2014-10-21 ENCOUNTER — Encounter: Payer: Self-pay | Admitting: Gastroenterology

## 2014-11-04 ENCOUNTER — Other Ambulatory Visit: Payer: Self-pay | Admitting: Specialist

## 2014-11-04 DIAGNOSIS — M545 Low back pain: Secondary | ICD-10-CM

## 2014-11-18 ENCOUNTER — Ambulatory Visit
Admission: RE | Admit: 2014-11-18 | Discharge: 2014-11-18 | Disposition: A | Discharge status: Home / Self Care | Payer: Medicare Other | Source: Ambulatory Visit | Attending: Specialist | Admitting: Specialist

## 2014-11-18 DIAGNOSIS — M545 Low back pain: Secondary | ICD-10-CM

## 2014-11-18 MED ORDER — GADOBENATE DIMEGLUMINE 529 MG/ML IV SOLN
20.0000 mL | Freq: Once | INTRAVENOUS | Status: AC | PRN
  Administered 2014-11-18: 20 mL via INTRAVENOUS

## 2014-12-02 ENCOUNTER — Encounter (HOSPITAL_COMMUNITY): Payer: Self-pay

## 2014-12-02 ENCOUNTER — Emergency Department (HOSPITAL_COMMUNITY): Payer: Medicare Other

## 2014-12-02 ENCOUNTER — Emergency Department (HOSPITAL_COMMUNITY)
Admission: EM | Admit: 2014-12-02 | Discharge: 2014-12-02 | Disposition: A | Discharge status: Home / Self Care | Payer: Medicare Other | Attending: Emergency Medicine | Admitting: Emergency Medicine

## 2014-12-02 DIAGNOSIS — J449 Chronic obstructive pulmonary disease, unspecified: Secondary | ICD-10-CM | POA: Insufficient documentation

## 2014-12-02 DIAGNOSIS — Z7982 Long term (current) use of aspirin: Secondary | ICD-10-CM | POA: Diagnosis not present

## 2014-12-02 DIAGNOSIS — Z87442 Personal history of urinary calculi: Secondary | ICD-10-CM | POA: Diagnosis not present

## 2014-12-02 DIAGNOSIS — Z87891 Personal history of nicotine dependence: Secondary | ICD-10-CM | POA: Insufficient documentation

## 2014-12-02 DIAGNOSIS — M1990 Unspecified osteoarthritis, unspecified site: Secondary | ICD-10-CM | POA: Insufficient documentation

## 2014-12-02 DIAGNOSIS — N390 Urinary tract infection, site not specified: Secondary | ICD-10-CM | POA: Diagnosis not present

## 2014-12-02 DIAGNOSIS — K219 Gastro-esophageal reflux disease without esophagitis: Secondary | ICD-10-CM | POA: Diagnosis not present

## 2014-12-02 DIAGNOSIS — Z7951 Long term (current) use of inhaled steroids: Secondary | ICD-10-CM | POA: Diagnosis not present

## 2014-12-02 DIAGNOSIS — I252 Old myocardial infarction: Secondary | ICD-10-CM | POA: Insufficient documentation

## 2014-12-02 DIAGNOSIS — Z79899 Other long term (current) drug therapy: Secondary | ICD-10-CM | POA: Diagnosis not present

## 2014-12-02 DIAGNOSIS — Z9861 Coronary angioplasty status: Secondary | ICD-10-CM | POA: Diagnosis not present

## 2014-12-02 DIAGNOSIS — R509 Fever, unspecified: Secondary | ICD-10-CM | POA: Insufficient documentation

## 2014-12-02 DIAGNOSIS — R519 Headache, unspecified: Secondary | ICD-10-CM

## 2014-12-02 DIAGNOSIS — I10 Essential (primary) hypertension: Secondary | ICD-10-CM | POA: Insufficient documentation

## 2014-12-02 DIAGNOSIS — E785 Hyperlipidemia, unspecified: Secondary | ICD-10-CM | POA: Insufficient documentation

## 2014-12-02 DIAGNOSIS — Z9079 Acquired absence of other genital organ(s): Secondary | ICD-10-CM | POA: Insufficient documentation

## 2014-12-02 DIAGNOSIS — R51 Headache: Secondary | ICD-10-CM | POA: Diagnosis present

## 2014-12-02 DIAGNOSIS — I2510 Atherosclerotic heart disease of native coronary artery without angina pectoris: Secondary | ICD-10-CM | POA: Insufficient documentation

## 2014-12-02 LAB — URINALYSIS, ROUTINE W REFLEX MICROSCOPIC
Glucose, UA: NEGATIVE mg/dL
Ketones, ur: NEGATIVE mg/dL
NITRITE: POSITIVE — AB
PH: 6 (ref 5.0–8.0)
Protein, ur: 30 mg/dL — AB
SPECIFIC GRAVITY, URINE: 1.028 (ref 1.005–1.030)
Urobilinogen, UA: 1 mg/dL (ref 0.0–1.0)

## 2014-12-02 LAB — CBC WITH DIFFERENTIAL/PLATELET
Basophils Absolute: 0 10*3/uL (ref 0.0–0.1)
Basophils Relative: 0 % (ref 0–1)
Eosinophils Absolute: 0 10*3/uL (ref 0.0–0.7)
Eosinophils Relative: 0 % (ref 0–5)
HCT: 38.7 % — ABNORMAL LOW (ref 39.0–52.0)
HEMOGLOBIN: 13.5 g/dL (ref 13.0–17.0)
Lymphocytes Relative: 12 % (ref 12–46)
Lymphs Abs: 1.4 10*3/uL (ref 0.7–4.0)
MCH: 29.4 pg (ref 26.0–34.0)
MCHC: 34.9 g/dL (ref 30.0–36.0)
MCV: 84.3 fL (ref 78.0–100.0)
MONOS PCT: 9 % (ref 3–12)
Monocytes Absolute: 1 10*3/uL (ref 0.1–1.0)
NEUTROS ABS: 9.3 10*3/uL — AB (ref 1.7–7.7)
Neutrophils Relative %: 79 % — ABNORMAL HIGH (ref 43–77)
Platelets: 191 10*3/uL (ref 150–400)
RBC: 4.59 MIL/uL (ref 4.22–5.81)
RDW: 13.9 % (ref 11.5–15.5)
WBC: 11.7 10*3/uL — ABNORMAL HIGH (ref 4.0–10.5)

## 2014-12-02 LAB — BASIC METABOLIC PANEL
ANION GAP: 8 (ref 5–15)
BUN: 21 mg/dL (ref 6–23)
CHLORIDE: 99 meq/L (ref 96–112)
CO2: 23 mmol/L (ref 19–32)
Calcium: 8.7 mg/dL (ref 8.4–10.5)
Creatinine, Ser: 1 mg/dL (ref 0.50–1.35)
GFR calc Af Amer: 85 mL/min — ABNORMAL LOW (ref >90)
GFR calc non Af Amer: 74 mL/min — ABNORMAL LOW (ref >90)
Glucose, Bld: 124 mg/dL — ABNORMAL HIGH (ref 70–99)
Potassium: 3.8 mmol/L (ref 3.5–5.1)
Sodium: 130 mmol/L — ABNORMAL LOW (ref 135–145)

## 2014-12-02 LAB — URINE MICROSCOPIC-ADD ON

## 2014-12-02 MED ORDER — CIPROFLOXACIN HCL 500 MG PO TABS: 500.0000 mg | ORAL_TABLET | Freq: Two times a day (BID) | ORAL | Status: DC

## 2014-12-02 MED ORDER — CIPROFLOXACIN HCL 500 MG PO TABS
500.0000 mg | ORAL_TABLET | Freq: Once | ORAL | Status: AC
  Administered 2014-12-02: 500 mg via ORAL
  Filled 2014-12-02: qty 1

## 2014-12-02 MED ORDER — MORPHINE SULFATE 4 MG/ML IJ SOLN
4.0000 mg | INTRAMUSCULAR | Status: DC | PRN
  Administered 2014-12-02: 4 mg via INTRAVENOUS
  Filled 2014-12-02: qty 1

## 2014-12-02 MED ORDER — SODIUM CHLORIDE 0.9 % IV BOLUS (SEPSIS)
500.0000 mL | Freq: Once | INTRAVENOUS | Status: AC
  Administered 2014-12-02: 500 mL via INTRAVENOUS

## 2014-12-02 MED ORDER — ACETAMINOPHEN 325 MG PO TABS
650.0000 mg | ORAL_TABLET | Freq: Once | ORAL | Status: AC
  Administered 2014-12-02: 650 mg via ORAL
  Filled 2014-12-02: qty 2

## 2014-12-02 MED ORDER — HYDROCODONE-ACETAMINOPHEN 5-325 MG PO TABS: 2.0000 | ORAL_TABLET | ORAL | Status: DC | PRN

## 2014-12-02 MED ORDER — ONDANSETRON HCL 4 MG/2ML IJ SOLN
4.0000 mg | Freq: Once | INTRAMUSCULAR | Status: AC
  Administered 2014-12-02: 4 mg via INTRAVENOUS
  Filled 2014-12-02: qty 2

## 2014-12-02 MED ORDER — SODIUM CHLORIDE 0.9 % IV SOLN
Freq: Once | INTRAVENOUS | Status: AC
  Administered 2014-12-02: 19:00:00 via INTRAVENOUS

## 2014-12-02 NOTE — Discharge Instructions (Signed)
Fever, Adult A fever is a higher than normal body temperature. In an adult, an oral temperature around 98.6 F (37 C) is considered normal. A temperature of 100.4 F (38 C) or higher is generally considered a fever. Mild or moderate fevers generally have no long-term effects and often do not require treatment. Extreme fever (greater than or equal to 106 F or 41.1 C) can cause seizures. The sweating that may occur with repeated or prolonged fever may cause dehydration. Elderly people can develop confusion during a fever. A measured temperature can vary with:  Age.  Time of day.  Method of measurement (mouth, underarm, rectal, or ear). The fever is confirmed by taking a temperature with a thermometer. Temperatures can be taken different ways. Some methods are accurate and some are not.  An oral temperature is used most commonly. Electronic thermometers are fast and accurate.  An ear temperature will only be accurate if the thermometer is positioned as recommended by the manufacturer.  A rectal temperature is accurate and done for those adults who have a condition where an oral temperature cannot be taken.  An underarm (axillary) temperature is not accurate and not recommended. Fever is a symptom, not a disease.  CAUSES   Infections commonly cause fever.  Some noninfectious causes for fever include:  Some arthritis conditions.  Some thyroid or adrenal gland conditions.  Some immune system conditions.  Some types of cancer.  A medicine reaction.  High doses of certain street drugs such as methamphetamine.  Dehydration.  Exposure to high outside or room temperatures.  Occasionally, the source of a fever cannot be determined. This is sometimes called a "fever of unknown origin" (FUO).  Some situations may lead to a temporary rise in body temperature that may go away on its own. Examples are:  Childbirth.  Surgery.  Intense exercise. HOME CARE INSTRUCTIONS   Take  appropriate medicines for fever. Follow dosing instructions carefully. If you use acetaminophen to reduce the fever, be careful to avoid taking other medicines that also contain acetaminophen. Do not take aspirin for a fever if you are younger than age 67. There is an association with Reye's syndrome. Reye's syndrome is a rare but potentially deadly disease.  If an infection is present and antibiotics have been prescribed, take them as directed. Finish them even if you start to feel better.  Rest as needed.  Maintain an adequate fluid intake. To prevent dehydration during an illness with prolonged or recurrent fever, you may need to drink extra fluid.Drink enough fluids to keep your urine clear or pale yellow.  Sponging or bathing with room temperature water may help reduce body temperature. Do not use ice water or alcohol sponge baths.  Dress comfortably, but do not over-bundle. SEEK MEDICAL CARE IF:   You are unable to keep fluids down.  You develop vomiting or diarrhea.  You are not feeling at least partly better after 3 days.  You develop new symptoms or problems. SEEK IMMEDIATE MEDICAL CARE IF:   You have shortness of breath or trouble breathing.  You develop excessive weakness.  You are dizzy or you faint.  You are extremely thirsty or you are making little or no urine.  You develop new pain that was not there before (such as in the head, neck, chest, back, or abdomen).  You have persistent vomiting and diarrhea for more than 1 to 2 days.  You develop a stiff neck or your eyes become sensitive to light.  You develop a  skin rash.  You have a fever or persistent symptoms for more than 2 to 3 days.  You have a fever and your symptoms suddenly get worse. MAKE SURE YOU:   Understand these instructions.  Will watch your condition.  Will get help right away if you are not doing well or get worse. Document Released: 05/24/2001 Document Revised: 04/14/2014 Document  Reviewed: 09/29/2011 [facility] Patient Information 2015 [place], [place]. This information is not intended to replace advice given to you by your health care provider. Make sure you discuss any questions you have with your health care provider.  General Headache Without Cause A headache is pain or discomfort felt around the head or neck area. The specific cause of a headache may not be found. There are many causes and types of headaches. A few common ones are:  Tension headaches.  Migraine headaches.  Cluster headaches.  Chronic daily headaches. HOME CARE INSTRUCTIONS   Keep all follow-up appointments with your caregiver or any specialist referral.  Only take over-the-counter or prescription medicines for pain or discomfort as directed by your caregiver.  Lie down in a dark, quiet room when you have a headache.  Keep a headache journal to find out what may trigger your migraine headaches. For example, write down:  What you eat and drink.  How much sleep you get.  Any change to your diet or medicines.  Try massage or other relaxation techniques.  Put ice packs or heat on the head and neck. Use these 3 to 4 times per day for 15 to 20 minutes each time, or as needed.  Limit stress.  Sit up straight, and do not tense your muscles.  Quit smoking if you smoke.  Limit alcohol use.  Decrease the amount of caffeine you drink, or stop drinking caffeine.  Eat and sleep on a regular schedule.  Get 7 to 9 hours of sleep, or as recommended by your caregiver.  Keep lights dim if bright lights bother you and make your headaches worse. SEEK MEDICAL CARE IF:   You have problems with the medicines you were prescribed.  Your medicines are not working.  You have a change from the usual headache.  You have nausea or vomiting. SEEK IMMEDIATE MEDICAL CARE IF:   Your headache becomes severe.  You have a fever.  You have a stiff neck.  You have loss of vision.  You have  muscular weakness or loss of muscle control.  You start losing your balance or have trouble walking.  You feel faint or pass out.  You have severe symptoms that are different from your first symptoms. MAKE SURE YOU:   Understand these instructions.  Will watch your condition.  Will get help right away if you are not doing well or get worse. Document Released: 11/28/2005 Document Revised: 02/20/2012 Document Reviewed: 12/14/2011 [facility] Patient Information 2015 [place], [place]. This information is not intended to replace advice given to you by your health care provider. Make sure you discuss any questions you have with your health care provider.  Urinary Tract Infection Urinary tract infections (UTIs) can develop anywhere along your urinary tract. Your urinary tract is your body's drainage system for removing wastes and extra water. Your urinary tract includes two kidneys, two ureters, a bladder, and a urethra. Your kidneys are a pair of bean-shaped organs. Each kidney is about the size of your fist. They are located below your ribs, one on each side of your spine. CAUSES Infections are caused by microbes, which  are microscopic organisms, including fungi, viruses, and bacteria. These organisms are so small that they can only be seen through a microscope. Bacteria are the microbes that most commonly cause UTIs. SYMPTOMS  Symptoms of UTIs may vary by age and gender of the patient and by the location of the infection. Symptoms in young women typically include a frequent and intense urge to urinate and a painful, burning feeling in the bladder or urethra during urination. Older women and men are more likely to be tired, shaky, and weak and have muscle aches and abdominal pain. A fever may mean the infection is in your kidneys. Other symptoms of a kidney infection include pain in your back or sides below the ribs, nausea, and vomiting. DIAGNOSIS To diagnose a UTI, your caregiver will ask you about  your symptoms. Your caregiver also will ask to provide a urine sample. The urine sample will be tested for bacteria and white blood cells. White blood cells are made by your body to help fight infection. TREATMENT  Typically, UTIs can be treated with medication. Because most UTIs are caused by a bacterial infection, they usually can be treated with the use of antibiotics. The choice of antibiotic and length of treatment depend on your symptoms and the type of bacteria causing your infection. HOME CARE INSTRUCTIONS  If you were prescribed antibiotics, take them exactly as your caregiver instructs you. Finish the medication even if you feel better after you have only taken some of the medication.  Drink enough water and fluids to keep your urine clear or pale yellow.  Avoid caffeine, tea, and carbonated beverages. They tend to irritate your bladder.  Empty your bladder often. Avoid holding urine for long periods of time.  Empty your bladder before and after sexual intercourse.  After a bowel movement, women should cleanse from front to back. Use each tissue only once. SEEK MEDICAL CARE IF:   You have back pain.  You develop a fever.  Your symptoms do not begin to resolve within 3 days. SEEK IMMEDIATE MEDICAL CARE IF:   You have severe back pain or lower abdominal pain.  You develop chills.  You have nausea or vomiting.  You have continued burning or discomfort with urination. MAKE SURE YOU:   Understand these instructions.  Will watch your condition.  Will get help right away if you are not doing well or get worse. Document Released: 09/07/2005 Document Revised: 05/29/2012 Document Reviewed: 01/06/2012 [facility] Patient Information 2015 [place], [place]. This information is not intended to replace advice given to you by your health care provider. Make sure you discuss any questions you have with your health care provider.

## 2014-12-02 NOTE — ED Notes (Signed)
Per pt, pt has had headache since yesterday.  Was seen by primary and sent here.

## 2014-12-02 NOTE — ED Provider Notes (Signed)
CSN: [id number]     Arrival date & time 12/02/14  1645 History   First MD Initiated Contact with Patient 12/02/14 1732     Chief Complaint  Patient presents with  . Headache     HPI  Patient blister evaluation of headache. Describes as "like my face is burning up". This feels intermittent episodes where he feels like his face is flushed and burning that is bilateral. From his ears and forward bitemporal bifrontal. No neck pain or stiffness no rash. Some urinary frequency but no dysuria. No flank pain but some mild diffuse back pain. No cough sputum production difficulty breathing.  At the present for 48 hours. Symmetric care physician today and referred here.  Past Medical History  Diagnosis Date  . Kidney stone   . Hypertension   . S/P orchiectomy   . Diverticulitis   . Prostatic hypertrophy   . Gastric ulcer   . MI (myocardial infarction)   . CAD (coronary artery disease)   . Esophageal stricture   . Hyperlipemia   . COPD (chronic obstructive pulmonary disease)   . Shortness of breath   . GERD (gastroesophageal reflux disease)   . Arthritis    Past Surgical History  Procedure Laterality Date  . Appendectomy    . Angioplasty      Stent  . Thyroid surgery    . Cholecystectomy    . Cholecystectomy    . Coronary angioplasty    . Back surgery     Family History  Problem Relation Age of Onset  . Colon cancer Neg Hx   . Lung cancer Father    History  Substance Use Topics  . Smoking status: Former Smoker -- 78 years    Quit date: 12/12/1998  . Smokeless tobacco: Never Used  . Alcohol Use: No    Review of Systems  Constitutional: Positive for fever. Negative for chills, diaphoresis, appetite change and fatigue.  HENT: Negative for mouth sores, sore throat and trouble swallowing.   Eyes: Negative for visual disturbance.  Respiratory: Negative for cough, chest tightness, shortness of breath and wheezing.   Cardiovascular: Negative for chest pain.    Gastrointestinal: Negative for nausea, vomiting, abdominal pain, diarrhea and abdominal distention.  Endocrine: Negative for polydipsia, polyphagia and polyuria.  Genitourinary: Negative for dysuria, frequency and hematuria.  Musculoskeletal: Positive for back pain. Negative for gait problem.  Skin: Negative for color change, pallor and rash.  Neurological: Positive for headaches. Negative for dizziness, syncope and light-headedness.  Hematological: Does not bruise/bleed easily.  Psychiatric/Behavioral: Negative for behavioral problems and confusion.      Allergies  Codeine and Niaspan  Home Medications   Prior to Admission medications   Medication Sig Start Date End Date Taking? Authorizing Provider  aspirin 81 MG tablet Take 81 mg by mouth daily after supper.    Yes Historical Provider, MD  budesonide-formoterol (SYMBICORT) 160-4.5 MCG/ACT inhaler Inhale 2 puffs into the lungs 2 (two) times daily. 09/28/13  Yes Clent Demark, MD  Choline Fenofibrate 135 MG capsule Take 135 mg by mouth daily.   Yes Historical Provider, MD  esomeprazole (NEXIUM) 40 MG capsule Take 40 mg by mouth daily as needed (indigestion).    Yes Historical Provider, MD  gemfibrozil (LOPID) 600 MG tablet Take 600 mg by mouth 2 (two) times daily before a meal.   Yes Historical Provider, MD  lisinopril (PRINIVIL,ZESTRIL) 10 MG tablet Take 10 mg by mouth daily after supper.    Yes Historical Provider, MD  metoprolol (TOPROL-XL) 50 MG 24 hr tablet Take 50 mg by mouth daily after supper.    Yes Historical Provider, MD  pravastatin (PRAVACHOL) 40 MG tablet Take 20 mg by mouth daily.    Yes Historical Provider, MD  ciprofloxacin (CIPRO) 500 MG tablet Take 1 tablet (500 mg total) by mouth every 12 (twelve) hours. 12/02/14   Tanna Furry, MD  HYDROcodone-acetaminophen (NORCO/VICODIN) 5-325 MG per tablet Take 2 tablets by mouth every 4 (four) hours as needed. 12/02/14   Tanna Furry, MD  Hyprom-Naphaz-Polysorb-Zn Sulf (CLEAR  EYES COMPLETE OP) Apply 1 drop to eye daily as needed (drye eyes.).    Historical Provider, MD  nitroGLYCERIN (NITROSTAT) 0.4 MG SL tablet Place 1 tablet (0.4 mg total) under the tongue every 5 (five) minutes as needed for chest pain. 05/03/13   Clent Demark, MD  oxyCODONE-acetaminophen (PERCOCET/ROXICET) 5-325 MG per tablet Take 1-2 tablets by mouth every 4 (four) hours as needed for severe pain. 05/18/14   Leota Jacobsen, MD   BP 103/59 mmHg  Pulse 83  Temp(Src) 99.5 F (37.5 C) (Oral)  Resp 18  SpO2 93% Physical Exam  Constitutional: He is oriented to person, place, and time. He appears well-developed and well-nourished. No distress.  HENT:  Head: Normocephalic.    Eyes: Conjunctivae are normal. Pupils are equal, round, and reactive to light. No scleral icterus.  Neck: Normal range of motion. Neck supple. No thyromegaly present.  Supple neck.  Cardiovascular: Normal rate and regular rhythm.  Exam reveals no gallop and no friction rub.   No murmur heard. Pulmonary/Chest: Effort normal and breath sounds normal. No respiratory distress. He has no wheezes. He has no rales.  Clear bilateral breath sounds.  Abdominal: Soft. Bowel sounds are normal. He exhibits no distension. There is no tenderness. There is no rebound.  Musculoskeletal: Normal range of motion.       Back:  Neurological: He is alert and oriented to person, place, and time.  Skin: Skin is warm and dry. No rash noted.  Psychiatric: He has a normal mood and affect. His behavior is normal.    ED Course  Procedures (including critical care time) Labs Review Labs Reviewed  CBC WITH DIFFERENTIAL - Abnormal; Notable for the following:    WBC 11.7 (*)    HCT 38.7 (*)    Neutrophils Relative % 79 (*)    Neutro Abs 9.3 (*)    All other components within normal limits  BASIC METABOLIC PANEL - Abnormal; Notable for the following:    Sodium 130 (*)    Glucose, Bld 124 (*)    GFR calc non Af Amer 74 (*)    GFR calc Af  Amer 85 (*)    All other components within normal limits  URINALYSIS, ROUTINE W REFLEX MICROSCOPIC - Abnormal; Notable for the following:    Color, Urine AMBER (*)    APPearance TURBID (*)    Hgb urine dipstick TRACE (*)    Bilirubin Urine SMALL (*)    Protein, ur 30 (*)    Nitrite POSITIVE (*)    Leukocytes, UA SMALL (*)    All other components within normal limits  URINE MICROSCOPIC-ADD ON - Abnormal; Notable for the following:    Bacteria, UA MANY (*)    All other components within normal limits  URINE CULTURE    Imaging Review Dg Chest 2 View  12/02/2014   CLINICAL DATA:  73 year old male with 1 day history of fever  EXAM: CHEST  2 VIEW  COMPARISON:  Prior chest x-ray 09/26/2013  FINDINGS: Cardiac and mediastinal contours remain within normal limits. Trace atherosclerotic calcification in the transverse aorta. The lungs are clear. No focal airspace consolidation, pulmonary edema, pleural effusion or pneumothorax. Mild central bronchitic change similar compared to prior. No acute osseous abnormality.  IMPRESSION: No active cardiopulmonary disease.   Electronically Signed   By: Jacqulynn Cadet M.D.   On: 12/02/2014 19:59   Ct Head Wo Contrast  12/02/2014   CLINICAL DATA:  Headache for 2 days, bilateral maxillary sinus pressure  EXAM: CT HEAD WITHOUT CONTRAST  TECHNIQUE: Contiguous axial images were obtained from the base of the skull through the vertex without intravenous contrast.  COMPARISON:  05/18/2014  FINDINGS: Stable prominence of the bilateral frontal extra-axial spaces which could be due to cystic hygromas or related to mild cortical volume loss. No acute hemorrhage, infarct, or mass lesion is identified. No midline shift. No skull fracture. Orbits and paranasal sinuses are unremarkable.  IMPRESSION: No acute intracranial abnormality. Stable mild diffuse cortical volume loss.   Electronically Signed   By: Conchita Paris M.D.   On: 12/02/2014 19:58   [place] Wo  Cm  12/02/2014   CLINICAL DATA:  Facial pain, bilateral maxillary sinus pressure  EXAM: CT PARANASAL SINUS LIMITED WITHOUT CONTRAST  TECHNIQUE: Non-contiguous multidetector CT images of the paranasal sinuses were obtained in a single plane without contrast.  COMPARISON:  Standard noncontrast head CT same date, head CT 617 suspect head CT 05/18/2014  FINDINGS: No air-fluid level. Paranasal sinuses are clear. No osseous destruction or fracture visualized. The patient is partly edentulous. Mastoid air cells are pneumatized. Findings referable to the base of the brain are reported in prior dedicated exam dictated separately today.  IMPRESSION: No CT evidence for acute sinusitis.   Electronically Signed   By: Conchita Paris M.D.   On: 12/02/2014 20:01     EKG Interpretation None      MDM   Final diagnoses:  Headache  Facial pain  Fever  UTI (lower urinary tract infection)    Patient feeling much improved. Urine shows signs of infection nitrate positive. White blood cells and bacteria. Culture pending. Low-grade temp initially 100.8. Recheck 99 4. After fluids meds and Tylenol he is asymptomatic. No signs of this being seen S infection. No signs of sinus disease.  reassuring CT scan. No rash or other concerning findings. Plan is home. Recheck with any worsening symptoms.    Tanna Furry, MD 12/02/14 2206

## 2014-12-04 LAB — URINE CULTURE: Colony Count: >=100000

## 2014-12-05 ENCOUNTER — Telehealth (HOSPITAL_BASED_OUTPATIENT_CLINIC_OR_DEPARTMENT_OTHER): Payer: Self-pay | Admitting: Emergency Medicine

## 2014-12-05 NOTE — Telephone Encounter (Signed)
Post ED Visit - Positive Culture Follow-up  Culture report reviewed by antimicrobial stewardship pharmacist: []  Wes Dulaney, Pharm.D., BCPS []  Heide Guile, Pharm.D., BCPS []  Alycia Rossetti, Pharm.D., BCPS []  [place], Pharm.D., BCPS, AAHIVP []  Legrand Como, Pharm.D., BCPS, AAHIVP []  Isac Sarna, Pharm.D., BCPS  Positive urine culture >100,000 E. Coli Treated with Ciprofloxacin , organism sensitive to the same and no further patient follow-up is required at this time.  Hazle Nordmann 12/05/2014, 4:05 PM

## 2014-12-17 ENCOUNTER — Ambulatory Visit (AMBULATORY_SURGERY_CENTER): Payer: Self-pay | Admitting: *Deleted

## 2014-12-17 VITALS — Ht 74.0 in | Wt 226.0 lb

## 2014-12-17 DIAGNOSIS — Z86010 Personal history of colonic polyps: Secondary | ICD-10-CM

## 2014-12-17 MED ORDER — NA SULFATE-K SULFATE-MG SULF 17.5-3.13-1.6 GM/177ML PO SOLN: ORAL | Status: DC

## 2014-12-17 NOTE — Progress Notes (Signed)
Patient denies any allergies to eggs or soy. Patient denies any problems with anesthesia/sedation. Patient denies any oxygen use at home and does not take any diet/weight loss medications. No computer access per pt. Wife at side during pre-visit.

## 2014-12-31 ENCOUNTER — Encounter: Payer: Self-pay | Admitting: Gastroenterology

## 2014-12-31 ENCOUNTER — Ambulatory Visit (AMBULATORY_SURGERY_CENTER): Payer: Medicare HMO | Admitting: Gastroenterology

## 2014-12-31 VITALS — BP 116/71 | HR 82 | Temp 97.2°F | Resp 18 | Ht 74.0 in | Wt 226.0 lb

## 2014-12-31 DIAGNOSIS — Z86010 Personal history of colonic polyps: Secondary | ICD-10-CM | POA: Diagnosis not present

## 2014-12-31 DIAGNOSIS — K5730 Diverticulosis of large intestine without perforation or abscess without bleeding: Secondary | ICD-10-CM

## 2014-12-31 MED ORDER — SODIUM CHLORIDE 0.9 % IV SOLN: 500.0000 mL | INTRAVENOUS | Status: DC

## 2014-12-31 NOTE — Progress Notes (Signed)
A/ox3 pleased with MAC, report to Jane RN 

## 2014-12-31 NOTE — Patient Instructions (Signed)

## 2014-12-31 NOTE — Op Note (Signed)
[place]  [address]. [place], [address]   COLONOSCOPY PROCEDURE REPORT  PATIENT: Francisco Collier, Francisco Collier  MR#: [id number] BIRTHDATE: 1941-09-15 , 73  yrs. old GENDER: male ENDOSCOPIST: Inda Castle, MD REFERRED HF:WYOVZ Little, M.D. PROCEDURE DATE:  12/31/2014 PROCEDURE:   Colonoscopy, diagnostic First Screening Colonoscopy - Avg.  risk and is 50 yrs.  old or older - No.  Prior Negative Screening - Now for repeat screening. N/A  History of Adenoma - Now for follow-up colonoscopy & has been > or = to 3 yrs.  Yes hx of adenoma.  Has been 3 or more years since last colonoscopy.  Polyps Removed Today? No.  Recommend repeat exam, <10 yrs? Yes.  High risk (family or personal hx). ASA CLASS:   Class II INDICATIONS:high risk personal history of colonic polyps. 2011, history of malignant polyp 2010 MEDICATIONS: Monitored anesthesia care and Propofol mg IV  DESCRIPTION OF PROCEDURE:   After the risks benefits and alternatives of the procedure were thoroughly explained, informed consent was obtained.  The digital rectal exam revealed no abnormalities of the rectum.   The LB [id number]  endoscope was introduced through the anus and advanced to the cecum, which was identified by both the appendix and ileocecal valve. No adverse events experienced.   The quality of the prep was Suprep good  The instrument was then slowly withdrawn as the colon was fully examined.      COLON FINDINGS: There was mild diverticulosis noted in the sigmoid colon.   The examination was otherwise normal.  Retroflexed views revealed no abnormalities. The time to cecum=2 minutes 21 seconds. Withdrawal time=12 minutes 16 seconds.  The scope was withdrawn and the procedure completed. COMPLICATIONS: There were no immediate complications.  ENDOSCOPIC IMPRESSION: 1.   Mild diverticulosis was noted in the sigmoid colon 2.   The examination was otherwise normal  RECOMMENDATIONS: Colonoscopy  7 years  eSigned:  Inda Castle, MD 12/31/2014 9:50 AM   cc:

## 2015-01-01 ENCOUNTER — Telehealth: Payer: Self-pay | Admitting: *Deleted

## 2015-01-01 NOTE — Telephone Encounter (Signed)
No answer, left message to call if questions or concerns. 

## 2015-01-26 ENCOUNTER — Encounter (HOSPITAL_COMMUNITY): Payer: Self-pay | Admitting: Emergency Medicine

## 2015-01-26 ENCOUNTER — Emergency Department (HOSPITAL_COMMUNITY)
Admission: EM | Admit: 2015-01-26 | Discharge: 2015-01-26 | Disposition: A | Discharge status: Home / Self Care | Payer: Medicare HMO | Attending: Emergency Medicine | Admitting: Emergency Medicine

## 2015-01-26 ENCOUNTER — Emergency Department (HOSPITAL_COMMUNITY): Payer: Medicare HMO

## 2015-01-26 DIAGNOSIS — I2510 Atherosclerotic heart disease of native coronary artery without angina pectoris: Secondary | ICD-10-CM | POA: Diagnosis not present

## 2015-01-26 DIAGNOSIS — I10 Essential (primary) hypertension: Secondary | ICD-10-CM | POA: Diagnosis not present

## 2015-01-26 DIAGNOSIS — I252 Old myocardial infarction: Secondary | ICD-10-CM | POA: Diagnosis not present

## 2015-01-26 DIAGNOSIS — M1990 Unspecified osteoarthritis, unspecified site: Secondary | ICD-10-CM | POA: Insufficient documentation

## 2015-01-26 DIAGNOSIS — Z87891 Personal history of nicotine dependence: Secondary | ICD-10-CM | POA: Diagnosis not present

## 2015-01-26 DIAGNOSIS — K219 Gastro-esophageal reflux disease without esophagitis: Secondary | ICD-10-CM | POA: Insufficient documentation

## 2015-01-26 DIAGNOSIS — Z79899 Other long term (current) drug therapy: Secondary | ICD-10-CM | POA: Diagnosis not present

## 2015-01-26 DIAGNOSIS — E785 Hyperlipidemia, unspecified: Secondary | ICD-10-CM | POA: Insufficient documentation

## 2015-01-26 DIAGNOSIS — J0100 Acute maxillary sinusitis, unspecified: Secondary | ICD-10-CM

## 2015-01-26 DIAGNOSIS — R51 Headache: Secondary | ICD-10-CM

## 2015-01-26 DIAGNOSIS — R519 Headache, unspecified: Secondary | ICD-10-CM

## 2015-01-26 DIAGNOSIS — Z87442 Personal history of urinary calculi: Secondary | ICD-10-CM | POA: Insufficient documentation

## 2015-01-26 DIAGNOSIS — J441 Chronic obstructive pulmonary disease with (acute) exacerbation: Secondary | ICD-10-CM | POA: Diagnosis not present

## 2015-01-26 DIAGNOSIS — Z7982 Long term (current) use of aspirin: Secondary | ICD-10-CM | POA: Insufficient documentation

## 2015-01-26 LAB — CBC WITH DIFFERENTIAL/PLATELET
Basophils Absolute: 0 10*3/uL (ref 0.0–0.1)
Basophils Relative: 0 % (ref 0–1)
EOS ABS: 0.1 10*3/uL (ref 0.0–0.7)
Eosinophils Relative: 2 % (ref 0–5)
HCT: 42.2 % (ref 39.0–52.0)
Hemoglobin: 14.3 g/dL (ref 13.0–17.0)
LYMPHS PCT: 30 % (ref 12–46)
Lymphs Abs: 1.9 10*3/uL (ref 0.7–4.0)
MCH: 28.4 pg (ref 26.0–34.0)
MCHC: 33.9 g/dL (ref 30.0–36.0)
MCV: 83.9 fL (ref 78.0–100.0)
MONO ABS: 0.4 10*3/uL (ref 0.1–1.0)
Monocytes Relative: 6 % (ref 3–12)
NEUTROS PCT: 62 % (ref 43–77)
Neutro Abs: 4 10*3/uL (ref 1.7–7.7)
PLATELETS: 245 10*3/uL (ref 150–400)
RBC: 5.03 MIL/uL (ref 4.22–5.81)
RDW: 13.7 % (ref 11.5–15.5)
WBC: 6.4 10*3/uL (ref 4.0–10.5)

## 2015-01-26 LAB — COMPREHENSIVE METABOLIC PANEL
ALBUMIN: 3.8 g/dL (ref 3.5–5.2)
ALT: 24 U/L (ref 0–53)
ANION GAP: 8 (ref 5–15)
AST: 23 U/L (ref 0–37)
Alkaline Phosphatase: 69 U/L (ref 39–117)
BUN: 15 mg/dL (ref 6–23)
CALCIUM: 8.6 mg/dL (ref 8.4–10.5)
CHLORIDE: 102 mmol/L (ref 96–112)
CO2: 25 mmol/L (ref 19–32)
Creatinine, Ser: 0.81 mg/dL (ref 0.50–1.35)
GFR calc Af Amer: >90 mL/min (ref >90)
GFR calc non Af Amer: 87 mL/min — ABNORMAL LOW (ref >90)
Glucose, Bld: 127 mg/dL — ABNORMAL HIGH (ref 70–99)
POTASSIUM: 3.5 mmol/L (ref 3.5–5.1)
Sodium: 135 mmol/L (ref 135–145)
TOTAL PROTEIN: 6.6 g/dL (ref 6.0–8.3)
Total Bilirubin: 0.7 mg/dL (ref 0.3–1.2)

## 2015-01-26 MED ORDER — METHYLPREDNISOLONE SODIUM SUCC 125 MG IJ SOLR
125.0000 mg | Freq: Once | INTRAMUSCULAR | Status: AC
  Administered 2015-01-26: 125 mg via INTRAVENOUS
  Filled 2015-01-26: qty 2

## 2015-01-26 MED ORDER — MORPHINE SULFATE 4 MG/ML IJ SOLN
4.0000 mg | Freq: Once | INTRAMUSCULAR | Status: AC
  Administered 2015-01-26: 4 mg via INTRAVENOUS
  Filled 2015-01-26: qty 1

## 2015-01-26 MED ORDER — SODIUM CHLORIDE 0.9 % IV SOLN
INTRAVENOUS | Status: DC
  Administered 2015-01-26: 13:00:00 via INTRAVENOUS

## 2015-01-26 MED ORDER — METOCLOPRAMIDE HCL 5 MG/ML IJ SOLN
10.0000 mg | Freq: Once | INTRAMUSCULAR | Status: AC
  Administered 2015-01-26: 10 mg via INTRAVENOUS
  Filled 2015-01-26: qty 2

## 2015-01-26 MED ORDER — AMOXICILLIN 500 MG PO CAPS
500.0000 mg | ORAL_CAPSULE | Freq: Once | ORAL | Status: AC
  Administered 2015-01-26: 500 mg via ORAL
  Filled 2015-01-26: qty 1

## 2015-01-26 MED ORDER — AMOXICILLIN 500 MG PO CAPS: 500.0000 mg | ORAL_CAPSULE | Freq: Three times a day (TID) | ORAL | Status: DC

## 2015-01-26 MED ORDER — HYDROCODONE-ACETAMINOPHEN 5-325 MG PO TABS: ORAL_TABLET | ORAL | Status: DC

## 2015-01-26 NOTE — ED Provider Notes (Signed)
CSN: [id number]     Arrival date & time 01/26/15  1201 History   First MD Initiated Contact with Patient 01/26/15 1216     Chief Complaint  Patient presents with  . Headache     (Consider location/radiation/quality/duration/timing/severity/associated sxs/prior Treatment) HPI   Francisco Collier is a 74 y.o. male with past medical history significant for hypertension, CAD, COPD complaining of bilateral periorbital headache onset last week rated at 9 out of 60, not alleviated by Excedrin Migraine or acetaminophen and associated with nausea, and mild bilateral blurred vision. Patient states that headaches are atypical for him, he reports that he has a clear runny nose. He denies dysarthria, ataxia, unilateral weakness, chest pain, shortness of breath. On review of systems patient notes that he's had 2 falls in the last week and cannot explain why, states that he was awake throughout both the thumb, denies syncope. Denies nausea or feeling lightheaded, patient does state that he feels unsteady on his feet. Denies blood thinners  Past Medical History  Diagnosis Date  . Kidney stone   . Hypertension   . S/P orchiectomy   . Diverticulitis   . Prostatic hypertrophy   . Gastric ulcer   . CAD (coronary artery disease)   . Esophageal stricture   . Hyperlipemia   . COPD (chronic obstructive pulmonary disease)   . Shortness of breath   . GERD (gastroesophageal reflux disease)   . MI (myocardial infarction) 2005  . Arthritis     back   Past Surgical History  Procedure Laterality Date  . Appendectomy    . Angioplasty  2005    Stent  . Thyroid surgery    . Cholecystectomy    . Cholecystectomy    . Coronary angioplasty    . Back surgery     Family History  Problem Relation Age of Onset  . Colon cancer Neg Hx   . Lung cancer Father    History  Substance Use Topics  . Smoking status: Former Smoker -- 91 years    Quit date: 12/12/1998  . Smokeless tobacco: Never Used  . Alcohol Use: No     Review of Systems  10 systems reviewed and found to be negative, except as noted in the HPI.   Allergies  Codeine and Niaspan  Home Medications   Prior to Admission medications   Medication Sig Start Date End Date Taking? Authorizing Provider  aspirin 81 MG tablet Take 81 mg by mouth daily after supper.     Historical Provider, MD  esomeprazole (NEXIUM) 40 MG capsule Take 40 mg by mouth daily as needed (indigestion).     Historical Provider, MD  gemfibrozil (LOPID) 600 MG tablet Take 600 mg by mouth 2 (two) times daily before a meal.    Historical Provider, MD  HYDROcodone-acetaminophen (NORCO/VICODIN) 5-325 MG per tablet Take 2 tablets by mouth every 4 (four) hours as needed. 12/02/14   Tanna Furry, MD  Hyprom-Naphaz-Polysorb-Zn Sulf (CLEAR EYES COMPLETE OP) Apply 1 drop to eye daily as needed (drye eyes.).    Historical Provider, MD  lisinopril (PRINIVIL,ZESTRIL) 10 MG tablet Take 20 mg by mouth daily after supper.     Historical Provider, MD  meloxicam (MOBIC) 15 MG tablet Take 15 mg by mouth daily.    Historical Provider, MD  metoprolol (TOPROL-XL) 50 MG 24 hr tablet Take 50 mg by mouth daily after supper.     Historical Provider, MD  nitroGLYCERIN (NITROSTAT) 0.4 MG SL tablet Place 1 tablet (0.4 mg  total) under the tongue every 5 (five) minutes as needed for chest pain. Patient not taking: Reported on 12/17/2014 05/03/13   Clent Demark, MD  pravastatin (PRAVACHOL) 40 MG tablet Take 20 mg by mouth daily.     Historical Provider, MD   BP 172/92 mmHg  Pulse 74  Temp(Src) 97.8 F (36.6 C) (Oral)  Resp 18  SpO2 100% Physical Exam  Constitutional: He is oriented to person, place, and time. He appears well-developed and well-nourished. No distress.  HENT:  Head: Normocephalic.  Mouth/Throat: Oropharynx is clear and moist.  No rhinorrhea or mucosal edema in the nares. Patient has bilateral tenderness to palpation of the maxillary sinuses.  Tympanic membranes with normal  architecture and good light reflexes, posterior pharynx is slightly injected  Eyes: Conjunctivae and EOM are normal. Pupils are equal, round, and reactive to light.  Neck: Normal range of motion. Neck supple.  Cardiovascular: Normal rate, regular rhythm and intact distal pulses.   Pulmonary/Chest: Effort normal and breath sounds normal. No stridor. No respiratory distress. He has no wheezes. He has no rales. He exhibits no tenderness.  Abdominal: Soft. Bowel sounds are normal. He exhibits no distension and no mass. There is no tenderness. There is no rebound and no guarding.  Musculoskeletal: Normal range of motion.  Neurological: He is alert and oriented to person, place, and time.  II-Visual fields grossly intact. III/IV/VI-Extraocular movements intact.  Pupils reactive bilaterally. V/VII-Smile symmetric, equal eyebrow raise,  facial sensation intact VIII- Hearing grossly intact IX/X-Normal gag XI-bilateral shoulder shrug XII-midline tongue extension Motor: 5/5 bilaterally with normal tone and bulk Cerebellar: Normal finger-to-nose  and normal heel-to-shin test.   Romberg negative Ambulates with a coordinated gait   Psychiatric: He has a normal mood and affect.  Nursing note and vitals reviewed.   ED Course  Procedures (including critical care time) Labs Review Labs Reviewed  COMPREHENSIVE METABOLIC PANEL - Abnormal; Notable for the following:    Glucose, Bld 127 (*)    GFR calc non Af Amer 87 (*)    All other components within normal limits  CBC WITH DIFFERENTIAL/PLATELET    Imaging Review No results found.   EKG Interpretation   Date/Time:  Monday January 26 2015 12:25:20 EST Ventricular Rate:  74 PR Interval:  189 QRS Duration: 78 QT Interval:  408 QTC Calculation: 453 R Axis:   61 Text Interpretation:  Sinus rhythm Nonspecific T wave abnormality No  significant change since last tracing Confirmed by Ashok Cordia  MD, Lennette Bihari  ([id number]) on 01/26/2015 12:48:08 PM       MDM   Final diagnoses:  Bad headache  Acute maxillary sinusitis, recurrence not specified    Filed Vitals:   01/26/15 1209 01/26/15 1218  BP: 172/92 173/91  Pulse: 74 75  Temp: 97.8 F (36.6 C) 98.7 F (37.1 C)  TempSrc: Oral Oral  Resp: 18 24  SpO2: 100% 98%    Medications  0.9 %  sodium chloride infusion ( Intravenous New Bag/Given 01/26/15 1303)  metoCLOPramide (REGLAN) injection 10 mg (10 mg Intravenous Given 01/26/15 1303)  methylPREDNISolone sodium succinate (SOLU-MEDROL) 125 mg/2 mL injection 125 mg (125 mg Intravenous Given 01/26/15 1303)  morphine 4 MG/ML injection 4 mg (4 mg Intravenous Given 01/26/15 1353)    Francisco Collier is a pleasant 74 y.o. male presenting with headache. Neuro exam is nonfocal. Patient says that he does not normally get headaches however there are multiple CT scans and MRI for chief complaint of headache. Think this may  be related to a sinusitis as patient has nasal discharge and is focally tender on the maxillary sinuses. Patient states that he's been falling more than normal lately, he reports he feels unsteady on his feet however his gait is coordinated and cerebellar symptoms are negative in the ED. Syncope workup is also normal. Patient reports improvement with headache cocktail.  This is a shared visit with the attending physician who personally evaluated the patient and agrees with the care plan.   Evaluation does not show pathology that would require ongoing emergent intervention or inpatient treatment. Pt is hemodynamically stable and mentating appropriately. Discussed findings and plan with patient/guardian, who agrees with care plan. All questions answered. Return precautions discussed and outpatient follow up given.   Discharge Medication List as of 01/26/2015  2:40 PM    START taking these medications   Details  amoxicillin (AMOXIL) 500 MG capsule Take 1 capsule (500 mg total) by mouth 3 (three) times daily., Starting 01/26/2015, Until  Discontinued, Print             Monico Blitz, PA-C 01/26/15 [place], MD 01/27/15 [phone]

## 2015-01-26 NOTE — ED Notes (Signed)
Per pt, states headache since last Fri-sick on stomach

## 2015-01-26 NOTE — Discharge Instructions (Signed)
Use nasal saline (you can try [employer] Simply Saline) at least 4 times a day, use saline 5-10 minutes before using the fluticasone (flonase) nasal spray  Do not use Afrin (Oxymetazoline)  Rest, wash hands frequently  and drink plenty of water.  You may try counter medication such as Mucinex or Sudafed decongestant.   Take your antibiotics as directed and to completion. You should never have any leftover antibiotics! Push fluids and stay well hydrated.   Take vicodin for breakthrough pain, do not drink alcohol, drive, care for children or do other critical tasks while taking vicodin.  Please follow with your primary care doctor in the next 2 days for a check-up. They must obtain records for further management.   Do not hesitate to return to the Emergency Department for any new, worsening or concerning symptoms.

## 2015-02-03 ENCOUNTER — Encounter (HOSPITAL_COMMUNITY): Payer: Self-pay | Admitting: *Deleted

## 2015-02-03 ENCOUNTER — Emergency Department (HOSPITAL_COMMUNITY)
Admission: EM | Admit: 2015-02-03 | Discharge: 2015-02-03 | Disposition: A | Discharge status: Home / Self Care | Payer: Medicare HMO | Attending: Emergency Medicine | Admitting: Emergency Medicine

## 2015-02-03 ENCOUNTER — Emergency Department (HOSPITAL_COMMUNITY): Payer: Medicare HMO

## 2015-02-03 DIAGNOSIS — Z87438 Personal history of other diseases of male genital organs: Secondary | ICD-10-CM | POA: Insufficient documentation

## 2015-02-03 DIAGNOSIS — Z87891 Personal history of nicotine dependence: Secondary | ICD-10-CM | POA: Diagnosis not present

## 2015-02-03 DIAGNOSIS — Z9861 Coronary angioplasty status: Secondary | ICD-10-CM | POA: Insufficient documentation

## 2015-02-03 DIAGNOSIS — J449 Chronic obstructive pulmonary disease, unspecified: Secondary | ICD-10-CM | POA: Diagnosis not present

## 2015-02-03 DIAGNOSIS — I2510 Atherosclerotic heart disease of native coronary artery without angina pectoris: Secondary | ICD-10-CM | POA: Insufficient documentation

## 2015-02-03 DIAGNOSIS — Z791 Long term (current) use of non-steroidal anti-inflammatories (NSAID): Secondary | ICD-10-CM | POA: Insufficient documentation

## 2015-02-03 DIAGNOSIS — R51 Headache: Secondary | ICD-10-CM | POA: Diagnosis not present

## 2015-02-03 DIAGNOSIS — E785 Hyperlipidemia, unspecified: Secondary | ICD-10-CM | POA: Insufficient documentation

## 2015-02-03 DIAGNOSIS — Z7951 Long term (current) use of inhaled steroids: Secondary | ICD-10-CM | POA: Diagnosis not present

## 2015-02-03 DIAGNOSIS — K219 Gastro-esophageal reflux disease without esophagitis: Secondary | ICD-10-CM | POA: Diagnosis not present

## 2015-02-03 DIAGNOSIS — Z792 Long term (current) use of antibiotics: Secondary | ICD-10-CM | POA: Insufficient documentation

## 2015-02-03 DIAGNOSIS — Z7982 Long term (current) use of aspirin: Secondary | ICD-10-CM | POA: Insufficient documentation

## 2015-02-03 DIAGNOSIS — Z87448 Personal history of other diseases of urinary system: Secondary | ICD-10-CM | POA: Diagnosis not present

## 2015-02-03 DIAGNOSIS — M1990 Unspecified osteoarthritis, unspecified site: Secondary | ICD-10-CM | POA: Insufficient documentation

## 2015-02-03 DIAGNOSIS — Z79899 Other long term (current) drug therapy: Secondary | ICD-10-CM | POA: Insufficient documentation

## 2015-02-03 DIAGNOSIS — I252 Old myocardial infarction: Secondary | ICD-10-CM | POA: Diagnosis not present

## 2015-02-03 DIAGNOSIS — I10 Essential (primary) hypertension: Secondary | ICD-10-CM | POA: Diagnosis not present

## 2015-02-03 DIAGNOSIS — R519 Headache, unspecified: Secondary | ICD-10-CM

## 2015-02-03 LAB — BASIC METABOLIC PANEL
ANION GAP: 8 (ref 5–15)
BUN: 14 mg/dL (ref 6–23)
CALCIUM: 8.7 mg/dL (ref 8.4–10.5)
CO2: 29 mmol/L (ref 19–32)
Chloride: 99 mmol/L (ref 96–112)
Creatinine, Ser: 0.96 mg/dL (ref 0.50–1.35)
GFR calc Af Amer: >90 mL/min (ref >90)
GFR, EST NON AFRICAN AMERICAN: 81 mL/min — AB (ref >90)
GLUCOSE: 110 mg/dL — AB (ref 70–99)
Potassium: 3.9 mmol/L (ref 3.5–5.1)
Sodium: 136 mmol/L (ref 135–145)

## 2015-02-03 LAB — CBC WITH DIFFERENTIAL/PLATELET
BASOS ABS: 0 10*3/uL (ref 0.0–0.1)
Basophils Relative: 1 % (ref 0–1)
Eosinophils Absolute: 0.2 10*3/uL (ref 0.0–0.7)
Eosinophils Relative: 2 % (ref 0–5)
HCT: 42.3 % (ref 39.0–52.0)
Hemoglobin: 14.4 g/dL (ref 13.0–17.0)
LYMPHS PCT: 33 % (ref 12–46)
Lymphs Abs: 2.6 10*3/uL (ref 0.7–4.0)
MCH: 28.1 pg (ref 26.0–34.0)
MCHC: 34 g/dL (ref 30.0–36.0)
MCV: 82.6 fL (ref 78.0–100.0)
Monocytes Absolute: 0.5 10*3/uL (ref 0.1–1.0)
Monocytes Relative: 6 % (ref 3–12)
Neutro Abs: 4.4 10*3/uL (ref 1.7–7.7)
Neutrophils Relative %: 58 % (ref 43–77)
Platelets: 267 10*3/uL (ref 150–400)
RBC: 5.12 MIL/uL (ref 4.22–5.81)
RDW: 13.8 % (ref 11.5–15.5)
WBC: 7.7 10*3/uL (ref 4.0–10.5)

## 2015-02-03 LAB — TROPONIN I: Troponin I: <0.03 ng/mL (ref <0.031)

## 2015-02-03 MED ORDER — TRAMADOL HCL 50 MG PO TABS
50.0000 mg | ORAL_TABLET | Freq: Once | ORAL | Status: AC
  Administered 2015-02-03: 50 mg via ORAL
  Filled 2015-02-03: qty 1

## 2015-02-03 MED ORDER — DIPHENHYDRAMINE HCL 50 MG/ML IJ SOLN
25.0000 mg | Freq: Once | INTRAMUSCULAR | Status: AC
  Administered 2015-02-03: 25 mg via INTRAVENOUS
  Filled 2015-02-03: qty 1

## 2015-02-03 MED ORDER — SODIUM CHLORIDE 0.9 % IV BOLUS (SEPSIS)
1000.0000 mL | Freq: Once | INTRAVENOUS | Status: AC
  Administered 2015-02-03: 1000 mL via INTRAVENOUS

## 2015-02-03 MED ORDER — METOCLOPRAMIDE HCL 5 MG/ML IJ SOLN
10.0000 mg | Freq: Once | INTRAMUSCULAR | Status: AC
  Administered 2015-02-03: 10 mg via INTRAVENOUS
  Filled 2015-02-03: qty 2

## 2015-02-03 MED ORDER — KETOROLAC TROMETHAMINE 30 MG/ML IM SOLN
30.0000 mg | Freq: Once | INTRAMUSCULAR | Status: DC
Start: 2015-02-03 — End: 2015-02-03

## 2015-02-03 NOTE — ED Notes (Signed)
Patient presents stating that he has been having trouble with his BP, falling down and a headache

## 2015-02-03 NOTE — ED Notes (Signed)
Patient states he was in the bed trying to go to sleep and his head was hurting, got up and took his blood pressure states it was 184/100 at home. States he was seen by his primary MD 3 weeks ago for same was in hospital has ct scan unable to get dx. C/o dizziness states he has fell several times in the past 2-3 weeks , fell earlier today denies injury from fall. C/o weakness in his legs, states he uses a cane at times to walk with.

## 2015-02-03 NOTE — Discharge Instructions (Signed)
Headaches, Frequently Asked Questions MIGRAINE HEADACHES Q: What is migraine? What causes it? How can I treat it? A: Generally, migraine headaches begin as a dull ache. Then they develop into a constant, throbbing, and pulsating pain. You may experience pain at the temples. You may experience pain at the front or back of one or both sides of the head. The pain is usually accompanied by a combination of:  Nausea.  Vomiting.  Sensitivity to light and noise. Some people (about 15%) experience an aura (see below) before an attack. The cause of migraine is believed to be chemical reactions in the brain. Treatment for migraine may include over-the-counter or prescription medications. It may also include self-help techniques. These include relaxation training and biofeedback.  Q: What is an aura? A: About 15% of people with migraine get an "aura". This is a sign of neurological symptoms that occur before a migraine headache. You may see wavy or jagged lines, dots, or flashing lights. You might experience tunnel vision or blind spots in one or both eyes. The aura can include visual or auditory hallucinations (something imagined). It may include disruptions in smell (such as strange odors), taste or touch. Other symptoms include:  Numbness.  A "pins and needles" sensation.  Difficulty in recalling or speaking the correct word. These neurological events may last as long as 60 minutes. These symptoms will fade as the headache begins. Q: What is a trigger? A: Certain physical or environmental factors can lead to or "trigger" a migraine. These include:  Foods.  Hormonal changes.  Weather.  Stress. It is important to remember that triggers are different for everyone. To help prevent migraine attacks, you need to figure out which triggers affect you. Keep a headache diary. This is a good way to track triggers. The diary will help you talk to your healthcare professional about your condition. Q: Does  weather affect migraines? A: Bright sunshine, hot, humid conditions, and drastic changes in barometric pressure may lead to, or "trigger," a migraine attack in some people. But studies have shown that weather does not act as a trigger for everyone with migraines. Q: What is the link between migraine and hormones? A: Hormones start and regulate many of your body's functions. Hormones keep your body in balance within a constantly changing environment. The levels of hormones in your body are unbalanced at times. Examples are during menstruation, pregnancy, or menopause. That can lead to a migraine attack. In fact, about three quarters of all women with migraine report that their attacks are related to the menstrual cycle.  Q: Is there an increased risk of stroke for migraine sufferers? A: The likelihood of a migraine attack causing a stroke is very remote. That is not to say that migraine sufferers cannot have a stroke associated with their migraines. In persons under age 53, the most common associated factor for stroke is migraine headache. But over the course of a person's normal life span, the occurrence of migraine headache may actually be associated with a reduced risk of dying from cerebrovascular disease due to stroke.  Q: What are acute medications for migraine? A: Acute medications are used to treat the pain of the headache after it has started. Examples over-the-counter medications, NSAIDs, ergots, and triptans.  Q: What are the triptans? A: Triptans are the newest class of abortive medications. They are specifically targeted to treat migraine. Triptans are vasoconstrictors. They moderate some chemical reactions in the brain. The triptans work on receptors in your brain. Triptans help  to restore the balance of a neurotransmitter called serotonin. Fluctuations in levels of serotonin are thought to be a main cause of migraine.  °Q: Are over-the-counter medications for migraine effective? °A:  Over-the-counter, or "OTC," medications may be effective in relieving mild to moderate pain and associated symptoms of migraine. But you should see your caregiver before beginning any treatment regimen for migraine.  °Q: What are preventive medications for migraine? °A: Preventive medications for migraine are sometimes referred to as "prophylactic" treatments. They are used to reduce the frequency, severity, and length of migraine attacks. Examples of preventive medications include antiepileptic medications, antidepressants, beta-blockers, calcium channel blockers, and NSAIDs (nonsteroidal anti-inflammatory drugs). °Q: Why are anticonvulsants used to treat migraine? °A: During the past few years, there has been an increased interest in antiepileptic drugs for the prevention of migraine. They are sometimes referred to as "anticonvulsants". Both epilepsy and migraine may be caused by similar reactions in the brain.  °Q: Why are antidepressants used to treat migraine? °A: Antidepressants are typically used to treat people with depression. They may reduce migraine frequency by regulating chemical levels, such as serotonin, in the brain.  °Q: What alternative therapies are used to treat migraine? °A: The term "alternative therapies" is often used to describe treatments considered outside the scope of conventional Western medicine. Examples of alternative therapy include acupuncture, acupressure, and yoga. Another common alternative treatment is herbal therapy. Some herbs are believed to relieve headache pain. Always discuss alternative therapies with your caregiver before proceeding. Some herbal products contain arsenic and other toxins. °TENSION HEADACHES °Q: What is a tension-type headache? What causes it? How can I treat it? °A: Tension-type headaches occur randomly. They are often the result of temporary stress, anxiety, fatigue, or anger. Symptoms include soreness in your temples, a tightening band-like sensation  around your head (a "vice-like" ache). Symptoms can also include a pulling feeling, pressure sensations, and contracting head and neck muscles. The headache begins in your forehead, temples, or the back of your head and neck. Treatment for tension-type headache may include over-the-counter or prescription medications. Treatment may also include self-help techniques such as relaxation training and biofeedback. °CLUSTER HEADACHES °Q: What is a cluster headache? What causes it? How can I treat it? °A: Cluster headache gets its name because the attacks come in groups. The pain arrives with little, if any, warning. It is usually on one side of the head. A tearing or bloodshot eye and a runny nose on the same side of the headache may also accompany the pain. Cluster headaches are believed to be caused by chemical reactions in the brain. They have been described as the most severe and intense of any headache type. Treatment for cluster headache includes prescription medication and oxygen. °SINUS HEADACHES °Q: What is a sinus headache? What causes it? How can I treat it? °A: When a cavity in the bones of the face and skull (a sinus) becomes inflamed, the inflammation will cause localized pain. This condition is usually the result of an allergic reaction, a tumor, or an infection. If your headache is caused by a sinus blockage, such as an infection, you will probably have a fever. An x-ray will confirm a sinus blockage. Your caregiver's treatment might include antibiotics for the infection, as well as antihistamines or decongestants.  °REBOUND HEADACHES °Q: What is a rebound headache? What causes it? How can I treat it? °A: A pattern of taking acute headache medications too often can lead to a condition known as "rebound headache."   A pattern of taking too much headache medication includes taking it more than 2 days per week or in excessive amounts. That means more than the label or a caregiver advises. With rebound  headaches, your medications not only stop relieving pain, they actually begin to cause headaches. Doctors treat rebound headache by tapering the medication that is being overused. Sometimes your caregiver will gradually substitute a different type of treatment or medication. Stopping may be a challenge. Regularly overusing a medication increases the potential for serious side effects. Consult a caregiver if you regularly use headache medications more than 2 days per week or more than the label advises. ADDITIONAL QUESTIONS AND ANSWERS Q: What is biofeedback? A: Biofeedback is a self-help treatment. Biofeedback uses special equipment to monitor your body's involuntary physical responses. Biofeedback monitors:  Breathing.  Pulse.  Heart rate.  Temperature.  Muscle tension.  Brain activity. Biofeedback helps you refine and perfect your relaxation exercises. You learn to control the physical responses that are related to stress. Once the technique has been mastered, you do not need the equipment any more. Q: Are headaches hereditary? A: Four out of five (80%) of people that suffer report a family history of migraine. Scientists are not sure if this is genetic or a family predisposition. Despite the uncertainty, a child has a 50% chance of having migraine if one parent suffers. The child has a 75% chance if both parents suffer.  Q: Can children get headaches? A: By the time they reach high school, most young people have experienced some type of headache. Many safe and effective approaches or medications can prevent a headache from occurring or stop it after it has begun.  Q: What type of doctor should I see to diagnose and treat my headache? A: Start with your primary caregiver. Discuss his or her experience and approach to headaches. Discuss methods of classification, diagnosis, and treatment. Your caregiver may decide to recommend you to a headache specialist, depending upon your symptoms or other  physical conditions. Having diabetes, allergies, etc., may require a more comprehensive and inclusive approach to your headache. The National Head[employer] Foundation will provide, upon request, a list of [facility] physician members in your state. Document Released: 02/18/2004 Document Revised: 02/20/2012 Document Reviewed: 07/28/2008 [facility] Patient Information 2015 [place], [place]. This information is not intended to replace advice given to you by your health care provider. Make sure you discuss any questions you have with your health care provider.  General Headache Without Cause A headache is pain or discomfort felt around the head or neck area. The specific cause of a headache may not be found. There are many causes and types of headaches. A few common ones are:  Tension headaches.  Migraine headaches.  Cluster headaches.  Chronic daily headaches. HOME CARE INSTRUCTIONS   Keep all follow-up appointments with your caregiver or any specialist referral.  Only take over-the-counter or prescription medicines for pain or discomfort as directed by your caregiver.  Lie down in a dark, quiet room when you have a headache.  Keep a headache journal to find out what may trigger your migraine headaches. For example, write down:  What you eat and drink.  How much sleep you get.  Any change to your diet or medicines.  Try massage or other relaxation techniques.  Put ice packs or heat on the head and neck. Use these 3 to 4 times per day for 15 to 20 minutes each time, or as needed.  Limit stress.  Sit up  straight, and do not tense your muscles.  Quit smoking if you smoke.  Limit alcohol use.  Decrease the amount of caffeine you drink, or stop drinking caffeine.  Eat and sleep on a regular schedule.  Get 7 to 9 hours of sleep, or as recommended by your caregiver.  Keep lights dim if bright lights bother you and make your headaches worse. SEEK MEDICAL CARE IF:   You have problems with the  medicines you were prescribed.  Your medicines are not working.  You have a change from the usual headache.  You have nausea or vomiting. SEEK IMMEDIATE MEDICAL CARE IF:   Your headache becomes severe.  You have a fever.  You have a stiff neck.  You have loss of vision.  You have muscular weakness or loss of muscle control.  You start losing your balance or have trouble walking.  You feel faint or pass out.  You have severe symptoms that are different from your first symptoms. MAKE SURE YOU:   Understand these instructions.  Will watch your condition.  Will get help right away if you are not doing well or get worse. Document Released: 11/28/2005 Document Revised: 02/20/2012 Document Reviewed: 12/14/2011 [facility] Patient Information 2015 [place], [place]. This information is not intended to replace advice given to you by your health care provider. Make sure you discuss any questions you have with your health care provider.

## 2015-02-03 NOTE — ED Notes (Signed)
Assisted patient to bathroom, ambulates without difficulty.

## 2015-02-03 NOTE — ED Provider Notes (Signed)
CSN: [id number]     Arrival date & time 02/03/15  0045 History   First MD Initiated Contact with Patient 02/03/15 [phone]     Chief Complaint  Patient presents with  . Headache  . Hypertension     (Consider location/radiation/quality/duration/timing/severity/associated sxs/prior Treatment) HPI    PCP: LITTLE,JAMES, MD Blood pressure 127/74, pulse 65, temperature 98 F (36.7 C), temperature source Oral, resp. rate 18, height 6\' 1"  (1.854 m), weight 230 lb (104.327 kg), SpO2 96 %.  Francisco Collier is a 74 y.o.male with a significant PMH of kidney stone, hypertension, s/p orchiectomy, prostatic hypertrophy, gastric ulcer, CAD, esophageal stricture, hyperlipidemia, COPD, SOB, GERD, MI, arthritis presents to the ER with complaints of headache that he woke up with at 12 am. He started having headaches 3 weeks ago and has been seen for them a few times. On arrival to the ER his headache was a 10/10, it is now a 7/10.  It is a bifrontal headache and bioccipital, throbbing in nature. Negative Review of Symptoms: He denies having syncope, change in vision, global or focal weakness, bowel or urine incontinence, ataxia, dysarthria, CP, SOB, back pain, weight loss, fevers, chills, nausea, vomiting, diarrhea, or neck pain.   Past Medical History  Diagnosis Date  . Kidney stone   . Hypertension   . S/P orchiectomy   . Diverticulitis   . Prostatic hypertrophy   . Gastric ulcer   . CAD (coronary artery disease)   . Esophageal stricture   . Hyperlipemia   . COPD (chronic obstructive pulmonary disease)   . Shortness of breath   . GERD (gastroesophageal reflux disease)   . MI (myocardial infarction) 2005  . Arthritis     back   Past Surgical History  Procedure Laterality Date  . Appendectomy    . Angioplasty  2005    Stent  . Thyroid surgery    . Cholecystectomy    . Cholecystectomy    . Coronary angioplasty    . Back surgery     Family History  Problem Relation Age of Onset  . Colon  cancer Neg Hx   . Lung cancer Father    History  Substance Use Topics  . Smoking status: Former Smoker -- 22 years    Quit date: 12/12/1998  . Smokeless tobacco: Never Used  . Alcohol Use: No    Review of Systems  10 Systems reviewed and are negative for acute change except as noted in the HPI.     Allergies  Codeine and Niaspan  Home Medications   Prior to Admission medications   Medication Sig Start Date End Date Taking? Authorizing Provider  acetaminophen (TYLENOL) 500 MG tablet Take 1,000 mg by mouth every 6 (six) hours as needed for moderate pain or headache.   Yes Historical Provider, MD  amoxicillin (AMOXIL) 500 MG capsule Take 1 capsule (500 mg total) by mouth 3 (three) times daily. 01/26/15  Yes Nicole Pisciotta, PA-C  aspirin 81 MG tablet Take 81 mg by mouth daily after supper.    Yes Historical Provider, MD  esomeprazole (NEXIUM) 40 MG capsule Take 40 mg by mouth daily as needed (indigestion).    Yes Historical Provider, MD  fluticasone (FLONASE) 50 MCG/ACT nasal spray Place 1 spray into both nostrils 2 (two) times daily.   Yes Historical Provider, MD  HYDROcodone-acetaminophen (NORCO/VICODIN) 5-325 MG per tablet Take 1-2 tablets by mouth every 6 hours as needed for pain and/or cough. 01/26/15  Yes Monico Blitz, PA-C  Hyprom-Naphaz-Polysorb-Zn  Sulf (CLEAR EYES COMPLETE OP) Apply 1 drop to eye daily as needed (drye eyes.).   Yes Historical Provider, MD  lisinopril (PRINIVIL,ZESTRIL) 20 MG tablet Take 20 mg by mouth daily.   Yes Historical Provider, MD  meloxicam (MOBIC) 15 MG tablet Take 15 mg by mouth daily.   Yes Historical Provider, MD  metoprolol (TOPROL-XL) 50 MG 24 hr tablet Take 50 mg by mouth daily after supper.    Yes Historical Provider, MD  nitroGLYCERIN (NITROSTAT) 0.4 MG SL tablet Place 1 tablet (0.4 mg total) under the tongue every 5 (five) minutes as needed for chest pain. 05/03/13  Yes Clent Demark, MD  pravastatin (PRAVACHOL) 40 MG tablet Take 20 mg  by mouth daily.    Yes Historical Provider, MD  ketorolac (TORADOL) 30 MG/ML injection Inject 1 mL (30 mg total) into the vein once. 02/03/15   Demeco Ducksworth Marilu Favre, PA-C   BP 146/80 mmHg  Pulse 67  Temp(Src) 98 F (36.7 C) (Oral)  Resp 17  Ht 6\' 1"  (1.854 m)  Wt 230 lb (104.327 kg)  BMI 30.35 kg/m2  SpO2 96% Physical Exam  Constitutional: He appears well-developed and well-nourished. No distress.  HENT:  Head: Normocephalic and atraumatic.  Right Ear: Tympanic membrane normal.  Left Ear: Tympanic membrane normal.  Nose: Nose normal.  Mouth/Throat: Uvula is midline and oropharynx is clear and moist.  Eyes: Pupils are equal, round, and reactive to light.  Neck: Normal range of motion. Neck supple. No Brudzinski's sign and no Kernig's sign noted.  Cardiovascular: Normal rate and regular rhythm.   Pulmonary/Chest: Effort normal.  Abdominal: Soft.  Neurological: He is alert.  Cranial nerves II-VIII and X-XII evaluated and show no deficits. Pt alert and oriented x 3 Upper and lower extremity strength is symmetrical and physiologic Normal muscular tone No facial droop Coordination intact, no limb ataxia, finger-nose-finger normal  Skin: Skin is warm and dry.  Nursing note and vitals reviewed.   ED Course  Procedures (including critical care time) Labs Review Labs Reviewed  BASIC METABOLIC PANEL - Abnormal; Notable for the following:    Glucose, Bld 110 (*)    GFR calc non Af Amer 81 (*)    All other components within normal limits  CBC WITH DIFFERENTIAL/PLATELET  TROPONIN I    Imaging Review Ct Head Wo Contrast  02/03/2015   CLINICAL DATA:  Headache for 3 weeks.  Dizziness.  Multiple falls.  EXAM: CT HEAD WITHOUT CONTRAST  TECHNIQUE: Contiguous axial images were obtained from the base of the skull through the vertex without intravenous contrast.  COMPARISON:  Head CT 12/02/2014, brain MRI 12/31/2008  FINDINGS: No intracranial hemorrhage, mass effect, or midline shift. No  hydrocephalus. The basilar cisterns are patent. No evidence of territorial infarct. No intracranial fluid collection. Bifrontal atrophy versus small cystic hygromas, unchanged. There is periventricular and deep white matter hypodensity, nonspecific, likely related to chronic small vessel ischemia. Calvarium is intact. Included paranasal sinuses and mastoid air cells are well aerated.  IMPRESSION: No acute intracranial abnormality.  Mild chronic change is stable.   Electronically Signed   By: Jeb Levering M.D.   On: 02/03/2015 05:37     EKG Interpretation None      MDM   Final diagnoses:  Nonintractable headache, unspecified chronicity pattern, unspecified headache type    Presentation is non concerning for [facility], ICH, Meningitis, or temporal arteritis. Pt is afebrile with no focal neuro deficits, nuchal rigidity, or change in vision. The patient denies any  symptoms of neurological impairment or TIA's; no amaurosis, diplopia, dysphasia, or unilateral disturbance of motor or sensory function. No loss of balance or vertigo.  Patients pain treated in the ED. Medications  sodium chloride 0.9 % bolus 1,000 mL (1,000 mLs Intravenous New Bag/Given 02/03/15 0738)  diphenhydrAMINE (BENADRYL) injection 25 mg (25 mg Intravenous Given 02/03/15 0738)  metoCLOPramide (REGLAN) injection 10 mg (10 mg Intravenous Given 02/03/15 0738)   His pain is now a 0-10. He was fed in the ER and ambulated without difficulty. His wife spoke with Dr. Terrence Dupont in his office this morning, they had an appointment scheduled for this evening and they will arrange close follow-up. I will also refer to [facility] for headaches. He has been advised to speak with Dr. Rex Kras his primary care doctor as well.  74 y.o.Lily Kocher Kucinski's evaluation in the Emergency Department is complete. It has been determined that no acute conditions requiring further emergency intervention are present at this time. The patient/guardian have been  advised of the diagnosis and plan. We have discussed signs and symptoms that warrant return to the ED, such as changes or worsening in symptoms.  Vital signs are stable at discharge. Filed Vitals:   02/03/15 0800  BP: 149/78  Pulse: 69  Temp:   Resp: 23    Patient/guardian has voiced understanding and agreed to follow-up with the PCP or specialist.    Linus Mako, PA-C 02/03/15 0848  April K Palumbo-Rasch, MD 02/03/15 2332

## 2015-02-03 NOTE — ED Notes (Signed)
Pt ambulated to discharge without difficulty.

## 2015-02-09 ENCOUNTER — Emergency Department (HOSPITAL_COMMUNITY)
Admission: EM | Admit: 2015-02-09 | Discharge: 2015-02-09 | Disposition: A | Discharge status: Home / Self Care | Payer: Medicare HMO | Attending: Emergency Medicine | Admitting: Emergency Medicine

## 2015-02-09 ENCOUNTER — Encounter (HOSPITAL_COMMUNITY): Payer: Self-pay | Admitting: Emergency Medicine

## 2015-02-09 DIAGNOSIS — E785 Hyperlipidemia, unspecified: Secondary | ICD-10-CM | POA: Diagnosis not present

## 2015-02-09 DIAGNOSIS — J449 Chronic obstructive pulmonary disease, unspecified: Secondary | ICD-10-CM | POA: Diagnosis not present

## 2015-02-09 DIAGNOSIS — G8929 Other chronic pain: Secondary | ICD-10-CM | POA: Diagnosis not present

## 2015-02-09 DIAGNOSIS — Z87442 Personal history of urinary calculi: Secondary | ICD-10-CM | POA: Insufficient documentation

## 2015-02-09 DIAGNOSIS — K219 Gastro-esophageal reflux disease without esophagitis: Secondary | ICD-10-CM | POA: Insufficient documentation

## 2015-02-09 DIAGNOSIS — Z792 Long term (current) use of antibiotics: Secondary | ICD-10-CM | POA: Diagnosis not present

## 2015-02-09 DIAGNOSIS — R51 Headache: Secondary | ICD-10-CM | POA: Diagnosis present

## 2015-02-09 DIAGNOSIS — Z9861 Coronary angioplasty status: Secondary | ICD-10-CM | POA: Diagnosis not present

## 2015-02-09 DIAGNOSIS — I10 Essential (primary) hypertension: Secondary | ICD-10-CM | POA: Diagnosis not present

## 2015-02-09 DIAGNOSIS — I252 Old myocardial infarction: Secondary | ICD-10-CM | POA: Diagnosis not present

## 2015-02-09 DIAGNOSIS — Z87891 Personal history of nicotine dependence: Secondary | ICD-10-CM | POA: Insufficient documentation

## 2015-02-09 DIAGNOSIS — R519 Headache, unspecified: Secondary | ICD-10-CM

## 2015-02-09 DIAGNOSIS — Z9079 Acquired absence of other genital organ(s): Secondary | ICD-10-CM | POA: Insufficient documentation

## 2015-02-09 DIAGNOSIS — Z7982 Long term (current) use of aspirin: Secondary | ICD-10-CM | POA: Diagnosis not present

## 2015-02-09 DIAGNOSIS — Z791 Long term (current) use of non-steroidal anti-inflammatories (NSAID): Secondary | ICD-10-CM | POA: Insufficient documentation

## 2015-02-09 DIAGNOSIS — I2510 Atherosclerotic heart disease of native coronary artery without angina pectoris: Secondary | ICD-10-CM | POA: Diagnosis not present

## 2015-02-09 DIAGNOSIS — Z79899 Other long term (current) drug therapy: Secondary | ICD-10-CM | POA: Diagnosis not present

## 2015-02-09 DIAGNOSIS — M479 Spondylosis, unspecified: Secondary | ICD-10-CM | POA: Diagnosis not present

## 2015-02-09 MED ORDER — SODIUM CHLORIDE 0.9 % IV BOLUS (SEPSIS)
500.0000 mL | Freq: Once | INTRAVENOUS | Status: AC
  Administered 2015-02-09: 500 mL via INTRAVENOUS

## 2015-02-09 MED ORDER — LORAZEPAM 2 MG/ML IJ SOLN
1.0000 mg | Freq: Once | INTRAMUSCULAR | Status: AC
  Administered 2015-02-09: 1 mg via INTRAVENOUS
  Filled 2015-02-09: qty 1

## 2015-02-09 MED ORDER — METOCLOPRAMIDE HCL 5 MG/ML IJ SOLN
10.0000 mg | Freq: Once | INTRAMUSCULAR | Status: AC
  Administered 2015-02-09: 10 mg via INTRAVENOUS
  Filled 2015-02-09: qty 2

## 2015-02-09 MED ORDER — METHOCARBAMOL 500 MG PO TABS
750.0000 mg | ORAL_TABLET | Freq: Once | ORAL | Status: AC
  Administered 2015-02-09: 750 mg via ORAL
  Filled 2015-02-09: qty 2

## 2015-02-09 MED ORDER — KETOROLAC TROMETHAMINE 30 MG/ML IJ SOLN
30.0000 mg | Freq: Once | INTRAMUSCULAR | Status: AC
  Administered 2015-02-09: 30 mg via INTRAVENOUS
  Filled 2015-02-09: qty 1

## 2015-02-09 MED ORDER — MAGNESIUM SULFATE 2 GM/50ML IV SOLN
2.0000 g | Freq: Once | INTRAVENOUS | Status: AC
  Administered 2015-02-09: 2 g via INTRAVENOUS
  Filled 2015-02-09: qty 50

## 2015-02-09 MED ORDER — ONDANSETRON HCL 4 MG/2ML IJ SOLN
4.0000 mg | Freq: Once | INTRAMUSCULAR | Status: AC
  Administered 2015-02-09: 4 mg via INTRAVENOUS
  Filled 2015-02-09: qty 2

## 2015-02-09 MED ORDER — BUPIVACAINE HCL (PF) 0.5 % IJ SOLN
50.0000 mL | Freq: Once | INTRAMUSCULAR | Status: AC
  Administered 2015-02-09: 50 mL
  Filled 2015-02-09: qty 60

## 2015-02-09 MED ORDER — BUTALBITAL-APAP-CAFFEINE 50-325-40 MG PO TABS: 1.0000 | ORAL_TABLET | Freq: Four times a day (QID) | ORAL | Status: DC | PRN

## 2015-02-09 MED ORDER — DIPHENHYDRAMINE HCL 50 MG/ML IJ SOLN
25.0000 mg | Freq: Once | INTRAMUSCULAR | Status: AC
  Administered 2015-02-09: 25 mg via INTRAVENOUS
  Filled 2015-02-09: qty 1

## 2015-02-09 NOTE — Discharge Instructions (Signed)
General Headache Without Cause A headache is pain or discomfort felt around the head or neck area. The specific cause of a headache may not be found. There are many causes and types of headaches. A few common ones are:  Tension headaches.  Migraine headaches.  Cluster headaches.  Chronic daily headaches. HOME CARE INSTRUCTIONS   Keep all follow-up appointments with your caregiver or any specialist referral.  Only take over-the-counter or prescription medicines for pain or discomfort as directed by your caregiver.  Lie down in a dark, quiet room when you have a headache.  Keep a headache journal to find out what may trigger your migraine headaches. For example, write down:  What you eat and drink.  How much sleep you get.  Any change to your diet or medicines.  Try massage or other relaxation techniques.  Put ice packs or heat on the head and neck. Use these 3 to 4 times per day for 15 to 20 minutes each time, or as needed.  Limit stress.  Sit up straight, and do not tense your muscles.  Quit smoking if you smoke.  Limit alcohol use.  Decrease the amount of caffeine you drink, or stop drinking caffeine.  Eat and sleep on a regular schedule.  Get 7 to 9 hours of sleep, or as recommended by your caregiver.  Keep lights dim if bright lights bother you and make your headaches worse. SEEK MEDICAL CARE IF:   You have problems with the medicines you were prescribed.  Your medicines are not working.  You have a change from the usual headache.  You have nausea or vomiting. SEEK IMMEDIATE MEDICAL CARE IF:   Your headache becomes severe.  You have a fever.  You have a stiff neck.  You have loss of vision.  You have muscular weakness or loss of muscle control.  You start losing your balance or have trouble walking.  You feel faint or pass out.  You have severe symptoms that are different from your first symptoms. MAKE SURE YOU:   Understand these  instructions.  Will watch your condition.  Will get help right away if you are not doing well or get worse. Document Released: 11/28/2005 Document Revised: 02/20/2012 Document Reviewed: 12/14/2011 [facility] Patient Information 2015 [place], [place]. This information is not intended to replace advice given to you by your health care provider. Make sure you discuss any questions you have with your health care provider.  Headaches, Frequently Asked Questions MIGRAINE HEADACHES Q: What is migraine? What causes it? How can I treat it? A: Generally, migraine headaches begin as a dull ache. Then they develop into a constant, throbbing, and pulsating pain. You may experience pain at the temples. You may experience pain at the front or back of one or both sides of the head. The pain is usually accompanied by a combination of:  Nausea.  Vomiting.  Sensitivity to light and noise. Some people (about 15%) experience an aura (see below) before an attack. The cause of migraine is believed to be chemical reactions in the brain. Treatment for migraine may include over-the-counter or prescription medications. It may also include self-help techniques. These include relaxation training and biofeedback.  Q: What is an aura? A: About 15% of people with migraine get an "aura". This is a sign of neurological symptoms that occur before a migraine headache. You may see wavy or jagged lines, dots, or flashing lights. You might experience tunnel vision or blind spots in one or both eyes. The  aura can include visual or auditory hallucinations (something imagined). It may include disruptions in smell (such as strange odors), taste or touch. Other symptoms include:  Numbness.  A "pins and needles" sensation.  Difficulty in recalling or speaking the correct word. These neurological events may last as long as 60 minutes. These symptoms will fade as the headache begins. Q: What is a trigger? A: Certain physical or  environmental factors can lead to or "trigger" a migraine. These include:  Foods.  Hormonal changes.  Weather.  Stress. It is important to remember that triggers are different for everyone. To help prevent migraine attacks, you need to figure out which triggers affect you. Keep a headache diary. This is a good way to track triggers. The diary will help you talk to your healthcare professional about your condition. Q: Does weather affect migraines? A: Bright sunshine, hot, humid conditions, and drastic changes in barometric pressure may lead to, or "trigger," a migraine attack in some people. But studies have shown that weather does not act as a trigger for everyone with migraines. Q: What is the link between migraine and hormones? A: Hormones start and regulate many of your body's functions. Hormones keep your body in balance within a constantly changing environment. The levels of hormones in your body are unbalanced at times. Examples are during menstruation, pregnancy, or menopause. That can lead to a migraine attack. In fact, about three quarters of all women with migraine report that their attacks are related to the menstrual cycle.  Q: Is there an increased risk of stroke for migraine sufferers? A: The likelihood of a migraine attack causing a stroke is very remote. That is not to say that migraine sufferers cannot have a stroke associated with their migraines. In persons under age 35, the most common associated factor for stroke is migraine headache. But over the course of a person's normal life span, the occurrence of migraine headache may actually be associated with a reduced risk of dying from cerebrovascular disease due to stroke.  Q: What are acute medications for migraine? A: Acute medications are used to treat the pain of the headache after it has started. Examples over-the-counter medications, NSAIDs, ergots, and triptans.  Q: What are the triptans? A: Triptans are the newest class  of abortive medications. They are specifically targeted to treat migraine. Triptans are vasoconstrictors. They moderate some chemical reactions in the brain. The triptans work on receptors in your brain. Triptans help to restore the balance of a neurotransmitter called serotonin. Fluctuations in levels of serotonin are thought to be a main cause of migraine.  Q: Are over-the-counter medications for migraine effective? A: Over-the-counter, or "OTC," medications may be effective in relieving mild to moderate pain and associated symptoms of migraine. But you should see your caregiver before beginning any treatment regimen for migraine.  Q: What are preventive medications for migraine? A: Preventive medications for migraine are sometimes referred to as "prophylactic" treatments. They are used to reduce the frequency, severity, and length of migraine attacks. Examples of preventive medications include antiepileptic medications, antidepressants, beta-blockers, calcium channel blockers, and NSAIDs (nonsteroidal anti-inflammatory drugs). Q: Why are anticonvulsants used to treat migraine? A: During the past few years, there has been an increased interest in antiepileptic drugs for the prevention of migraine. They are sometimes referred to as "anticonvulsants". Both epilepsy and migraine may be caused by similar reactions in the brain.  Q: Why are antidepressants used to treat migraine? A: Antidepressants are typically used to treat people with  depression. They may reduce migraine frequency by regulating chemical levels, such as serotonin, in the brain.  Q: What alternative therapies are used to treat migraine? A: The term "alternative therapies" is often used to describe treatments considered outside the scope of conventional Western medicine. Examples of alternative therapy include acupuncture, acupressure, and yoga. Another common alternative treatment is herbal therapy. Some herbs are believed to relieve  headache pain. Always discuss alternative therapies with your caregiver before proceeding. Some herbal products contain arsenic and other toxins. TENSION HEADACHES Q: What is a tension-type headache? What causes it? How can I treat it? A: Tension-type headaches occur randomly. They are often the result of temporary stress, anxiety, fatigue, or anger. Symptoms include soreness in your temples, a tightening band-like sensation around your head (a "vice-like" ache). Symptoms can also include a pulling feeling, pressure sensations, and contracting head and neck muscles. The headache begins in your forehead, temples, or the back of your head and neck. Treatment for tension-type headache may include over-the-counter or prescription medications. Treatment may also include self-help techniques such as relaxation training and biofeedback. CLUSTER HEADACHES Q: What is a cluster headache? What causes it? How can I treat it? A: Cluster headache gets its name because the attacks come in groups. The pain arrives with little, if any, warning. It is usually on one side of the head. A tearing or bloodshot eye and a runny nose on the same side of the headache may also accompany the pain. Cluster headaches are believed to be caused by chemical reactions in the brain. They have been described as the most severe and intense of any headache type. Treatment for cluster headache includes prescription medication and oxygen. SINUS HEADACHES Q: What is a sinus headache? What causes it? How can I treat it? A: When a cavity in the bones of the face and skull (a sinus) becomes inflamed, the inflammation will cause localized pain. This condition is usually the result of an allergic reaction, a tumor, or an infection. If your headache is caused by a sinus blockage, such as an infection, you will probably have a fever. An x-ray will confirm a sinus blockage. Your caregiver's treatment might include antibiotics for the infection, as well as  antihistamines or decongestants.  REBOUND HEADACHES Q: What is a rebound headache? What causes it? How can I treat it? A: A pattern of taking acute headache medications too often can lead to a condition known as "rebound headache." A pattern of taking too much headache medication includes taking it more than 2 days per week or in excessive amounts. That means more than the label or a caregiver advises. With rebound headaches, your medications not only stop relieving pain, they actually begin to cause headaches. Doctors treat rebound headache by tapering the medication that is being overused. Sometimes your caregiver will gradually substitute a different type of treatment or medication. Stopping may be a challenge. Regularly overusing a medication increases the potential for serious side effects. Consult a caregiver if you regularly use headache medications more than 2 days per week or more than the label advises. ADDITIONAL QUESTIONS AND ANSWERS Q: What is biofeedback? A: Biofeedback is a self-help treatment. Biofeedback uses special equipment to monitor your body's involuntary physical responses. Biofeedback monitors:  Breathing.  Pulse.  Heart rate.  Temperature.  Muscle tension.  Brain activity. Biofeedback helps you refine and perfect your relaxation exercises. You learn to control the physical responses that are related to stress. Once the technique has been mastered, you  do not need the equipment any more. Q: Are headaches hereditary? A: Four out of five (80%) of people that suffer report a family history of migraine. Scientists are not sure if this is genetic or a family predisposition. Despite the uncertainty, a child has a 50% chance of having migraine if one parent suffers. The child has a 75% chance if both parents suffer.  Q: Can children get headaches? A: By the time they reach high school, most young people have experienced some type of headache. Many safe and effective  approaches or medications can prevent a headache from occurring or stop it after it has begun.  Q: What type of doctor should I see to diagnose and treat my headache? A: Start with your primary caregiver. Discuss his or her experience and approach to headaches. Discuss methods of classification, diagnosis, and treatment. Your caregiver may decide to recommend you to a headache specialist, depending upon your symptoms or other physical conditions. Having diabetes, allergies, etc., may require a more comprehensive and inclusive approach to your headache. The National Headache Foundation will provide, upon request, a list of [facility] physician members in your state. Document Released: 02/18/2004 Document Revised: 02/20/2012 Document Reviewed: 07/28/2008 [facility] Patient Information 2015 [place], [place]. This information is not intended to replace advice given to you by your health care provider. Make sure you discuss any questions you have with your health care provider.

## 2015-02-09 NOTE — ED Notes (Signed)
Resting quietly with eye closed. Easily arousable. Verbally responsive. Resp even and unlabored. Pt with snoring resp.  ABC's intact. IV infusing mag 2gm without difficulty. NAD noted.

## 2015-02-09 NOTE — ED Provider Notes (Signed)
CSN: [id number]     Arrival date & time 02/09/15  0335 History   First MD Initiated Contact with Patient 02/09/15 [phone]     Chief Complaint  Patient presents with  . Headache     (Consider location/radiation/quality/duration/timing/severity/associated sxs/prior Treatment) HPI 74 year old male presents to emergency department from home with complaint of right-sided headache.  Patient has history of chronic headaches, since seen in the emergency department several times for same.  He reports that he is awaiting a referral to neurology from his primary care doctor, Dr. Rex Kras.  He saw his cardiologist, Dr. Terrence Dupont on Friday, as it was thought that may be high blood pressure was causing his headaches.  Patient has had recent head CTs, also has had negative MRIs in the past.  He reports that he has tried Tylenol and ibuprofen without improvement in his pain.  Patient denies any fever, chills.  Headache today is similar to prior headaches.  He has mild photophobia with the headache. Past Medical History  Diagnosis Date  . Kidney stone   . Hypertension   . S/P orchiectomy   . Diverticulitis   . Prostatic hypertrophy   . Gastric ulcer   . CAD (coronary artery disease)   . Esophageal stricture   . Hyperlipemia   . COPD (chronic obstructive pulmonary disease)   . Shortness of breath   . GERD (gastroesophageal reflux disease)   . MI (myocardial infarction) 2005  . Arthritis     back   Past Surgical History  Procedure Laterality Date  . Appendectomy    . Angioplasty  2005    Stent  . Thyroid surgery    . Cholecystectomy    . Cholecystectomy    . Coronary angioplasty    . Back surgery     Family History  Problem Relation Age of Onset  . Colon cancer Neg Hx   . Lung cancer Father    History  Substance Use Topics  . Smoking status: Former Smoker -- 6 years    Quit date: 12/12/1998  . Smokeless tobacco: Never Used  . Alcohol Use: No    Review of Systems   See History of  Present Illness; otherwise all other systems are reviewed and negative  Allergies  Codeine and Niaspan  Home Medications   Prior to Admission medications   Medication Sig Start Date End Date Taking? Authorizing Provider  acetaminophen (TYLENOL) 500 MG tablet Take 1,000 mg by mouth every 6 (six) hours as needed for moderate pain or headache.   Yes Historical Provider, MD  aspirin 81 MG tablet Take 81 mg by mouth daily after supper.    Yes Historical Provider, MD  esomeprazole (NEXIUM) 40 MG capsule Take 40 mg by mouth daily as needed (indigestion).    Yes Historical Provider, MD  fluticasone (FLONASE) 50 MCG/ACT nasal spray Place 1 spray into both nostrils 2 (two) times daily as needed for allergies or rhinitis.    Yes Historical Provider, MD  Hyprom-Naphaz-Polysorb-Zn Sulf (CLEAR EYES COMPLETE OP) Apply 1 drop to eye 2 (two) times daily as needed (drye eyes.).    Yes Historical Provider, MD  ibuprofen (ADVIL,MOTRIN) 200 MG tablet Take 400 mg by mouth every 6 (six) hours as needed for moderate pain.   Yes Historical Provider, MD  lisinopril (PRINIVIL,ZESTRIL) 20 MG tablet Take 40 mg by mouth daily.    Yes Historical Provider, MD  meloxicam (MOBIC) 15 MG tablet Take 15 mg by mouth daily.   Yes Historical Provider, MD  metoprolol (TOPROL-XL) 50 MG 24 hr tablet Take 50 mg by mouth daily after supper.    Yes Historical Provider, MD  nitroGLYCERIN (NITROSTAT) 0.4 MG SL tablet Place 1 tablet (0.4 mg total) under the tongue every 5 (five) minutes as needed for chest pain. 05/03/13  Yes Clent Demark, MD  pravastatin (PRAVACHOL) 40 MG tablet Take 20 mg by mouth daily.    Yes Historical Provider, MD  amoxicillin (AMOXIL) 500 MG capsule Take 1 capsule (500 mg total) by mouth 3 (three) times daily. Patient not taking: Reported on 02/09/2015 01/26/15   Elmyra Ricks Pisciotta, PA-C  HYDROcodone-acetaminophen (NORCO/VICODIN) 5-325 MG per tablet Take 1-2 tablets by mouth every 6 hours as needed for pain and/or  cough. Patient not taking: Reported on 02/09/2015 01/26/15   Elmyra Ricks Pisciotta, PA-C   BP 158/92 mmHg  Pulse 75  Temp(Src) 98.2 F (36.8 C) (Oral)  Resp 21  Ht 6\' 1"  (1.854 m)  Wt 227 lb (102.967 kg)  BMI 29.96 kg/m2  SpO2 95% Physical Exam  Constitutional: He is oriented to person, place, and time. He appears well-developed and well-nourished.  HENT:  Head: Normocephalic and atraumatic.  Nose: Nose normal.  Mouth/Throat: Oropharynx is clear and moist.  Eyes: Conjunctivae and EOM are normal. Pupils are equal, round, and reactive to light.  Neck: Normal range of motion. Neck supple. No JVD present. No tracheal deviation present. No thyromegaly present.  Patient is tender to palpation along paraspinal muscles, trapezius bilaterally, across masseter muscles and temporalis.  Cardiovascular: Normal rate, regular rhythm, normal heart sounds and intact distal pulses.  Exam reveals no gallop and no friction rub.   No murmur heard. Pulmonary/Chest: Effort normal and breath sounds normal. No stridor. No respiratory distress. He has no wheezes. He has no rales. He exhibits no tenderness.  Abdominal: Soft. Bowel sounds are normal. He exhibits no distension and no mass. There is no tenderness. There is no rebound and no guarding.  Musculoskeletal: Normal range of motion. He exhibits no edema or tenderness.  Lymphadenopathy:    He has no cervical adenopathy.  Neurological: He is alert and oriented to person, place, and time. He displays normal reflexes. No cranial nerve deficit. He exhibits normal muscle tone. Coordination normal.  Skin: Skin is warm and dry. No rash noted. No erythema. No pallor.  Psychiatric: He has a normal mood and affect. His behavior is normal. Judgment and thought content normal.  Nursing note and vitals reviewed.   ED Course  NERVE BLOCK Date/Time: 02/09/2015 4:38 AM Performed by: Kalman Drape Authorized by: Kalman Drape Consent: Verbal consent obtained. Risks and  benefits: risks, benefits and alternatives were discussed Indications: pain relief Nerve block body site: cervical paraspinous. Laterality: right (bilateral) Patient sedated: no Preparation: Patient was prepped and draped in the usual sterile fashion. Needle gauge: 25 G Location technique: anatomical landmarks Local anesthetic: bupivacaine 0.5% without epinephrine Anesthetic total: 3 ml Outcome: pain improved Comments: Only mild improvement in symptoms, pt sleeping when I entered the room.   (including critical care time) Labs Review Labs Reviewed - No data to display  Imaging Review No results found.   EKG Interpretation None         MDM   Final diagnoses:  Frequent headaches    74 year old male with acute on chronic headache.  No red flags on physical or exam.  Will attempt a cervical paraspinal LOC with bupivacaine as I think this may help his symptoms.  It seems more tension or musculoskeletal.  That anything else.  We'll encourage patient to contact Dr. Rex Kras in the morning for referral to neurology.  5:03 AM Pt reports injections only took edge off pain.  Will treat with headache cocktail, robaxin.  5:59 AM Patient reports no improvement after headache cocktail.  Will add on Ativan and 2 mg of magnesium.  7:04 AM Pt reports headache has resolved.  Will d/c home to f/u with Dr Rex Kras and neurology.  Kalman Drape, MD 02/09/15 [phone]

## 2015-02-09 NOTE — ED Notes (Signed)
Awake. Verbally responsive. A/O x4. Resp even and unlabored. No audible adventitious breath sounds noted. ABC's intact.  

## 2015-02-09 NOTE — ED Notes (Signed)
Bed: WO03 Expected date:  Expected time:  Means of arrival:  Comments: EMS 74yo M; headache

## 2015-02-09 NOTE — ED Notes (Signed)
Patient is from home. Per EMS, patient started experiencing a right sided headache at 1:30 this morning. Headaches have been intermittent for the last month. Pain described as dull ache with light sensitivity and nausea.

## 2015-02-13 ENCOUNTER — Encounter: Payer: Self-pay | Admitting: Neurology

## 2015-02-13 ENCOUNTER — Ambulatory Visit (INDEPENDENT_AMBULATORY_CARE_PROVIDER_SITE_OTHER): Payer: Medicare HMO | Admitting: Neurology

## 2015-02-13 VITALS — BP 132/80 | HR 75 | Ht 73.0 in | Wt 222.0 lb

## 2015-02-13 DIAGNOSIS — E669 Obesity, unspecified: Secondary | ICD-10-CM | POA: Diagnosis not present

## 2015-02-13 DIAGNOSIS — G4700 Insomnia, unspecified: Secondary | ICD-10-CM | POA: Insufficient documentation

## 2015-02-13 DIAGNOSIS — R519 Headache, unspecified: Secondary | ICD-10-CM | POA: Insufficient documentation

## 2015-02-13 DIAGNOSIS — G4719 Other hypersomnia: Secondary | ICD-10-CM | POA: Insufficient documentation

## 2015-02-13 DIAGNOSIS — G478 Other sleep disorders: Secondary | ICD-10-CM

## 2015-02-13 DIAGNOSIS — R51 Headache: Secondary | ICD-10-CM | POA: Diagnosis not present

## 2015-02-13 DIAGNOSIS — R0683 Snoring: Secondary | ICD-10-CM

## 2015-02-13 DIAGNOSIS — G4452 New daily persistent headache (NDPH): Secondary | ICD-10-CM | POA: Diagnosis not present

## 2015-02-13 MED ORDER — METHYLPREDNISOLONE 4 MG PO KIT: PACK | ORAL | Status: DC

## 2015-02-13 MED ORDER — KETOROLAC TROMETHAMINE 30 MG/ML IJ SOLN
60.0000 mg | Freq: Once | INTRAMUSCULAR | Status: AC
  Administered 2015-02-13: 60 mg via INTRAMUSCULAR

## 2015-02-13 MED ORDER — DIVALPROEX SODIUM ER 500 MG PO TB24: 500.0000 mg | ORAL_TABLET | Freq: Every day | ORAL | Status: DC

## 2015-02-13 MED ORDER — KETOROLAC TROMETHAMINE 60 MG/2ML IM SOLN: 60.0000 mg | Freq: Once | INTRAMUSCULAR | Status: DC

## 2015-02-13 MED ORDER — KETOROLAC TROMETHAMINE 60 MG/2ML IM SOLN
60.0000 mg | Freq: Once | INTRAMUSCULAR | Status: DC
Start: 2015-02-13 — End: 2015-02-13

## 2015-02-13 NOTE — Progress Notes (Signed)
Gave 60mg  Toradol (30mL) in the left deltoid. Patient tolerated well. Applied bandaid afterwards.

## 2015-02-13 NOTE — Patient Instructions (Signed)
Overall you are doing fairly well but I do want to suggest a few things today:   Remember to drink plenty of fluid, eat healthy meals and do not skip any meals. Try to eat protein with a every meal and eat a healthy snack such as fruit or nuts in between meals. Try to keep a regular sleep-wake schedule and try to exercise daily, particularly in the form of walking, 20-30 minutes a day, if you can.   As far as your medications are concerned, I would like to suggest: Depakote 500mg  once daily in the evenings  As far as diagnostic testing: MRI of the brain, Sleep study  I would like to see you back after testing, sooner if we need to. Please call us with any interim questions, concerns, problems, updates or refill requests.   Please also call us for any test results so we can go over those with you on the phone.  My clinical assistant and will answer any of your questions and relay your messages to me and also relay most of my messages to you.   Our phone number is [phone]. We also have an after hours call service for urgent matters and there is a physician on-call for urgent questions. For any emergencies you know to call 911 or go to the nearest emergency room

## 2015-02-13 NOTE — Progress Notes (Signed)
[facility]    Provider:  Dr Jaynee Eagles Referring Provider: Tamsen Roers, MD Primary Care Physician:  Tamsen Roers, MD  CC:  Headaches  HPI:  Francisco Collier is a 74 y.o. male here as a referral from Dr. Rex Kras for headache. PMHx HTN, CAD, COPD with bilateral periorbital headache. The headache starts in the back of the right side of the head but also hurts in the bilateral periorbital areas. Throbbing, pulsating. No light sensitivity, no increased sound sensitivity with the headache. He has nausea with the headaches. It gets to an "11/10". He has been to the ED 3xs. His blood pressure has been elevated. His blood pressure has been "off the charts" since then. He has ringing in the ears which is chronic. Wakes up with the headaches. He snores, if he sits down during the day will fall asleep, frequent awakenings at night. Headaches persistent, nothing helps, worse in the morning and lasts all day.   Reviewed notes, labs and imaging from outside physicians, which showed: Patient presented to the ED on 1/20 feeling flushed with a hot face. He had urine + for nitrite and was febrile. On 2/15 he presented with headache, EKG showed NSR with non-specific T wave changes. He c/o bilateral periorbital headache associated with nausea and blurred vision, no other focal neurologic deficits. 2 falls in the previous week. He is on chronic narcotics, mobic, pharyn was slightly injected per exam notes. He denied previous headaches however there are multiple CT scans and MRIs for headache complaints. Current episode thought to be due to sinusitis as exam showed congestion and maxillary tenderness. He was prescribed amoxicillin and discharged.   Ct showed no acute intracranial abnormalities including mass lesion or mass effect, hydrocephalus, extra-axial fluid collection, midline shift, hemorrhage, or acute infarction, large ischemic events (personally reviewed images)   Review of Systems: Patient  complains of symptoms per HPI as well as the following symptoms: headache, allergies, not enough sleep. Pertinent negatives per HPI. All others negative.   History   Social History  . Marital Status: Married    Spouse Name: Francisco Collier  . Number of Children: 0  . Years of Education: 12   Occupational History  . Retired    Social History Main Topics  . Smoking status: Former Smoker -- 103 years    Quit date: 12/12/1998  . Smokeless tobacco: Never Used  . Alcohol Use: No  . Drug Use: No  . Sexual Activity: Not on file   Other Topics Concern  . Not on file   Social History Narrative   Lives at home with wife.   Caffeine: none    Family History  Problem Relation Age of Onset  . Colon cancer Neg Hx   . Lung cancer Father     Past Medical History  Diagnosis Date  . Kidney stone   . Hypertension   . S/P orchiectomy   . Diverticulitis   . Prostatic hypertrophy   . Gastric ulcer   . CAD (coronary artery disease)   . Esophageal stricture   . Hyperlipemia   . COPD (chronic obstructive pulmonary disease)   . Shortness of breath   . GERD (gastroesophageal reflux disease)   . MI (myocardial infarction) 2005  . Arthritis     back    Past Surgical History  Procedure Laterality Date  . Appendectomy    . Angioplasty  2005    Stent  . Thyroid surgery    . Cholecystectomy    . Cholecystectomy    .  Coronary angioplasty    . Back surgery      Current Outpatient Prescriptions  Medication Sig Dispense Refill  . aspirin 81 MG tablet Take 81 mg by mouth daily after supper.     . butalbital-acetaminophen-caffeine (FIORICET) 50-325-40 MG per tablet Take 1-2 tablets by mouth every 6 (six) hours as needed for headache. 20 tablet 0  . esomeprazole (NEXIUM) 40 MG capsule Take 40 mg by mouth daily as needed (indigestion).     . fluticasone (FLONASE) 50 MCG/ACT nasal spray Place 1 spray into both nostrils 2 (two) times daily as needed for allergies or rhinitis.     Marland Kitchen lisinopril  (PRINIVIL,ZESTRIL) 20 MG tablet Take 40 mg by mouth daily.     . meloxicam (MOBIC) 15 MG tablet Take 15 mg by mouth daily.    . metoprolol (TOPROL-XL) 50 MG 24 hr tablet Take 50 mg by mouth daily after supper.     . nitroGLYCERIN (NITROSTAT) 0.4 MG SL tablet Place 1 tablet (0.4 mg total) under the tongue every 5 (five) minutes as needed for chest pain. 25 tablet 12  . pravastatin (PRAVACHOL) 40 MG tablet Take 20 mg by mouth daily.     Marland Kitchen acetaminophen (TYLENOL) 500 MG tablet Take 1,000 mg by mouth every 6 (six) hours as needed for moderate pain or headache.    Marland Kitchen amoxicillin (AMOXIL) 500 MG capsule Take 1 capsule (500 mg total) by mouth 3 (three) times daily. (Patient not taking: Reported on 02/09/2015) 30 capsule 0  . divalproex (DEPAKOTE ER) 500 MG 24 hr tablet Take 1 tablet (500 mg total) by mouth daily. 30 tablet 6  . HYDROcodone-acetaminophen (NORCO/VICODIN) 5-325 MG per tablet Take 1-2 tablets by mouth every 6 hours as needed for pain and/or cough. (Patient not taking: Reported on 02/09/2015) 7 tablet 0  . Hyprom-Naphaz-Polysorb-Zn Sulf (CLEAR EYES COMPLETE OP) Apply 1 drop to eye 2 (two) times daily as needed (drye eyes.).     Marland Kitchen ibuprofen (ADVIL,MOTRIN) 200 MG tablet Take 400 mg by mouth every 6 (six) hours as needed for moderate pain.    . methylPREDNISolone (MEDROL DOSEPAK) 4 MG tablet follow package directions 21 tablet 0  . traMADol (ULTRAM) 50 MG tablet      No current facility-administered medications for this visit.    Allergies as of 02/13/2015 - Review Complete 02/13/2015  Allergen Reaction Noted  . Codeine Nausea Only 12/04/2011  . Niaspan [niacin er] Other (See Comments) 07/21/2011    Vitals: BP 132/80 mmHg  Pulse 75  Ht 6\' 1"  (1.854 m)  Wt 222 lb (100.699 kg)  BMI 29.30 kg/m2 Last Weight:  Wt Readings from Last 1 Encounters:  02/13/15 222 lb (100.699 kg)   Last Height:   Ht Readings from Last 1 Encounters:  02/13/15 6\' 1"  (1.854 m)   Physical exam: Exam: Gen:  NAD, conversant, well nourised, well groomed                     CV: RRR, no MRG. No Carotid Bruits. No peripheral edema, warm, nontender Eyes: Conjunctivae clear without exudates or hemorrhage  Neuro: Detailed Neurologic Exam  Speech:    Speech is normal; fluent and spontaneous with normal comprehension.  Cognition:    The patient is oriented to person, place, and time;     recent and remote memory intact;     language fluent;     normal attention, concentration,     fund of knowledge Cranial Nerves:    The  pupils are equal, round, and reactive to light. The fundi are flat. Visual fields are full to finger confrontation. Extraocular movements are intact. Trigeminal sensation is intact and the muscles of mastication are normal. The face is symmetric. The palate elevates in the midline. Hearing intact. Voice is normal. Shoulder shrug is normal. The tongue has normal motion without fasciculations.   Coordination:    Normal finger to nose and heel to shin. Normal rapid alternating movements.   Gait:    Heel-toe and tandem gait are normal.   Motor Observation:    No asymmetry, no atrophy, and no involuntary movements noted. Tone:    Normal muscle tone.    Posture:    Posture is normal. normal erect    Strength:    Strength is V/V in the upper and lower limbs.      Sensation: intact to LT     Reflex Exam:  DTR's:    Deep tendon reflexes in the upper and lower extremities are normal bilaterally.   Toes:    The toes are downgoing bilaterally.   Clonus:    Clonus is absent.       Assessment/Plan:   Francisco Collier is a 74 y.o. male here as a referral from Dr. Rex Kras for headache. PMHx HTN, CAD, COPD with bilateral periorbital headache, new onset persistent daily worse in the mornings but lasts all day long, 3 ED visits and CT of the head normal.    Sleep study due to snoring, excessive daytime fatigue, nocturnal and morning headaches, frequent awakenings  Malampati  4 Neck circumference 18 BMI 30 FSS scale 53 ESS scale 6  Will start Depakote for headaches, need to monitor CMP regularly for liver function and electrolytes. Gave UpToDate patient drug information and reviewed.   Will order MRI of the brain.    Sarina Ill, MD  [facility] [address] [place], [place] [address]  Phone [phone] Fax [phone]

## 2015-02-15 ENCOUNTER — Encounter: Payer: Self-pay | Admitting: Neurology

## 2015-02-16 ENCOUNTER — Telehealth: Payer: Self-pay | Admitting: Neurology

## 2015-02-16 DIAGNOSIS — E663 Overweight: Secondary | ICD-10-CM

## 2015-02-16 DIAGNOSIS — R519 Headache, unspecified: Secondary | ICD-10-CM

## 2015-02-16 DIAGNOSIS — I213 ST elevation (STEMI) myocardial infarction of unspecified site: Secondary | ICD-10-CM

## 2015-02-16 DIAGNOSIS — R51 Headache: Secondary | ICD-10-CM

## 2015-02-16 DIAGNOSIS — R0683 Snoring: Secondary | ICD-10-CM

## 2015-02-16 DIAGNOSIS — G4719 Other hypersomnia: Secondary | ICD-10-CM

## 2015-02-16 NOTE — Telephone Encounter (Signed)
Sarina Ill, MD refers patient for attended sleep study.  Height: 6'1"  Weight: 222 lb  BMI: 29.30  Past Medical History:  Kidney stone    . Hypertension   . S/P orchiectomy   . Diverticulitis   . Prostatic hypertrophy   . Gastric ulcer   . CAD (coronary artery disease)   . Esophageal stricture   . Hyperlipemia   . COPD (chronic obstructive pulmonary disease)   . Shortness of breath   . GERD (gastroesophageal reflux disease)   . MI (myocardial infarction) 2005  . Arthritis     back           Sleep Symptoms: Snoring, excessive daytime fatigue, nocturnal, morning headaches, frequent awakenings, obesity   Epworth Score: ESS (6)   Medication:  Acetaminophen (Tab) TYLENOL 500 MG Take 1,000 mg by mouth every 6 (six) hours as needed for moderate pain or headache.       Amoxicillin (Cap) AMOXIL 500 MG Take 1 capsule (500 mg total) by mouth 3 (three) times daily.      Aspirin (Tab) aspirin 81 MG Take 81 mg by mouth daily after supper.       Butalbital-APAP-Caffeine (Tab) FIORICET, ESGIC 50-325-40 MG Take 1-2 tablets by mouth every 6 (six) hours as needed for headache.      Divalproex Sodium (Tablet SR 24 hr) DEPAKOTE ER 500 MG Take 1 tablet (500 mg total) by mouth daily.      Esomeprazole Magnesium (Capsule Delayed Release) NEXIUM 40 MG Take 40 mg by mouth daily as needed (indigestion).       Fluticasone Propionate (Suspension) FLONASE 50 MCG/ACT Place 1 spray into both nostrils 2 (two) times daily as needed for allergies or rhinitis.       Hydrocodone-Acetaminophen (Tab) NORCO/VICODIN 5-325 MG Take 1-2 tablets by mouth every 6 hours as needed for pain and/or cough.      Hyprom-Naphaz-Polysorb-Zn Sulf   Apply 1 drop to eye 2 (two) times daily as needed (drye eyes.).       Ibuprofen (Tab) ADVIL,MOTRIN 200 MG Take 400 mg by mouth every 6 (six) hours as needed for moderate pain.      Lisinopril (Tab)  PRINIVIL,ZESTRIL 20 MG Take 40 mg by mouth daily.       Meloxicam (Tab) MOBIC 15 MG Take 15 mg by mouth daily.      MethylPREDNISolone (Kit) MEDROL DOSEPAK 4 MG follow package directions      Metoprolol Succinate (Tablet SR 24 hr) TOPROL-XL 50 MG Take 50 mg by mouth daily after supper.       Nitroglycerin (SL Tab) NITROSTAT 0.4 MG Place 1 tablet (0.4 mg total) under the tongue every 5 (five) minutes as needed for chest pain.      Pravastatin Sodium (Tab) PRAVACHOL 40 MG Take 20 mg by mouth daily.       TraMADol HCl (Tab) ULTRAM 50 MG          Ins: [employer]   Assessment & Plan: SEBRON MCMAHILL is a 74 y.o. male here as a referral from Dr. Rex Kras for headache. PMHx HTN, CAD, COPD with bilateral periorbital headache, new onset persistent daily worse in the mornings but lasts all day long, 3 ED visits and CT of the head normal.    Sleep study due to snoring, excessive daytime fatigue, nocturnal and morning headaches, frequent awakenings  Malampati 4 Neck circumference 18 BMI 30 FSS scale 53 ESS scale 6  Will start Depakote for headaches, need to monitor  CMP regularly for liver function and electrolytes. Gave UpToDate patient drug information and reviewed.   Will order MRI of the brain.   Please review patient information and submit instructions for scheduling and orders for sleep technologist. Thank you.

## 2015-02-17 NOTE — Telephone Encounter (Signed)
In House Sleep study request review: This patient has an underlying medical history of hypertension, diverticulitis, peptic ulcer disease, coronary artery disease, status post MI, hyperlipidemia, COPD, arthritis, reflux disease and overweight state, and is referred by Dr. Jaynee Eagles for an attended sleep study due to a report of snoring, excessive daytime somnolence, multiple nighttime awakenings and morning headaches. I will order a split-night sleep study and see the patient in sleep medicine consultation afterwards as appropriate. Please print this note and attach to sleep study chart/package.   Sleep Acquisition Technologist instructions: Please score at 4% and split if 2 hour estimated AHI >15/h, unless mandated otherwise by the insurance carrier.    Star Age, MD, PhD [facility])

## 2015-02-19 ENCOUNTER — Telehealth: Payer: Self-pay | Admitting: Neurology

## 2015-02-19 NOTE — Telephone Encounter (Signed)
Spoke with patient and he would like until after scheduled MRI (03/03/15) before scheduling study.

## 2015-02-25 ENCOUNTER — Telehealth: Payer: Self-pay | Admitting: *Deleted

## 2015-02-25 NOTE — Telephone Encounter (Signed)
Wife calling stating that the patients Divalproex needs to be changed back. Please call the wife you get a chance.

## 2015-02-25 NOTE — Telephone Encounter (Signed)
Talked with patient and he said the depakote that Dr. Jaynee Eagles prescribed is not helping at all and he would like to try something different. I told him I would let Dr. Jaynee Eagles know and we would get back with him. Pt verbalized understanding.

## 2015-02-27 ENCOUNTER — Other Ambulatory Visit: Payer: Self-pay | Admitting: Neurology

## 2015-02-27 DIAGNOSIS — R519 Headache, unspecified: Secondary | ICD-10-CM

## 2015-02-27 DIAGNOSIS — R51 Headache: Principal | ICD-10-CM

## 2015-02-27 MED ORDER — TOPIRAMATE 25 MG PO TABS: ORAL_TABLET | ORAL | Status: DC

## 2015-02-27 NOTE — Telephone Encounter (Signed)
Patient with daily periorbital headaches with nausea. Denies vision changes. It hurts behind the eyes mostly but can get some temple pain. The back of the head hurts as well. +nausea. +sound sensitivity but not light sensitivity. If feels like pressure, not electric shooting pain. No jaw claudication. No shoulder or hip girdle stiffness. Sleep study is pending, sleep apnea suspected. Will check an ESR/CRP in patient, ordered, he should find a [facility] close to him. Depakote not working and he wants a different medication. He is already on beta blocker. Amitriptyline may be beneficial but difficult to use in the elderly. Topamax can cause sedation but may be good for patient at lower dose. Encouraged scheduling of sleep test.

## 2015-03-02 ENCOUNTER — Other Ambulatory Visit: Payer: Self-pay | Admitting: Neurology

## 2015-03-02 MED ORDER — TOPIRAMATE 25 MG PO TABS: ORAL_TABLET | ORAL | Status: DC

## 2015-03-03 ENCOUNTER — Ambulatory Visit
Admission: RE | Admit: 2015-03-03 | Discharge: 2015-03-03 | Disposition: A | Discharge status: Home / Self Care | Payer: Commercial Managed Care - HMO | Source: Ambulatory Visit | Attending: Neurology | Admitting: Neurology

## 2015-03-03 DIAGNOSIS — G4452 New daily persistent headache (NDPH): Secondary | ICD-10-CM | POA: Diagnosis not present

## 2015-03-06 ENCOUNTER — Telehealth: Payer: Self-pay | Admitting: Neurology

## 2015-03-06 NOTE — Telephone Encounter (Signed)
Spoke to patient. MRi of the brain stable from comparison 2010. He has a sleep study this Thursday, suspect OSA may be causing headaches. Patient acknowledged. He takes a daily asa for stroke prevention, discussed the small-vessel sichemic changes in the brain and need to monitor vascular risk factors closely.

## 2015-03-06 NOTE — Telephone Encounter (Signed)
Patient is calling to get MRI results. °

## 2015-04-16 ENCOUNTER — Emergency Department (HOSPITAL_COMMUNITY): Payer: Medicare HMO

## 2015-04-16 ENCOUNTER — Emergency Department (HOSPITAL_COMMUNITY)
Admission: EM | Admit: 2015-04-16 | Discharge: 2015-04-16 | Disposition: A | Discharge status: Home / Self Care | Payer: Medicare HMO | Attending: Emergency Medicine | Admitting: Emergency Medicine

## 2015-04-16 ENCOUNTER — Encounter (HOSPITAL_COMMUNITY): Payer: Self-pay

## 2015-04-16 DIAGNOSIS — I252 Old myocardial infarction: Secondary | ICD-10-CM | POA: Diagnosis not present

## 2015-04-16 DIAGNOSIS — I2510 Atherosclerotic heart disease of native coronary artery without angina pectoris: Secondary | ICD-10-CM | POA: Insufficient documentation

## 2015-04-16 DIAGNOSIS — Y9289 Other specified places as the place of occurrence of the external cause: Secondary | ICD-10-CM | POA: Diagnosis not present

## 2015-04-16 DIAGNOSIS — Z7982 Long term (current) use of aspirin: Secondary | ICD-10-CM | POA: Diagnosis not present

## 2015-04-16 DIAGNOSIS — J449 Chronic obstructive pulmonary disease, unspecified: Secondary | ICD-10-CM | POA: Insufficient documentation

## 2015-04-16 DIAGNOSIS — Z79899 Other long term (current) drug therapy: Secondary | ICD-10-CM | POA: Insufficient documentation

## 2015-04-16 DIAGNOSIS — Z87442 Personal history of urinary calculi: Secondary | ICD-10-CM | POA: Insufficient documentation

## 2015-04-16 DIAGNOSIS — Z87891 Personal history of nicotine dependence: Secondary | ICD-10-CM | POA: Insufficient documentation

## 2015-04-16 DIAGNOSIS — I10 Essential (primary) hypertension: Secondary | ICD-10-CM | POA: Insufficient documentation

## 2015-04-16 DIAGNOSIS — W19XXXA Unspecified fall, initial encounter: Secondary | ICD-10-CM

## 2015-04-16 DIAGNOSIS — Y9389 Activity, other specified: Secondary | ICD-10-CM | POA: Insufficient documentation

## 2015-04-16 DIAGNOSIS — K219 Gastro-esophageal reflux disease without esophagitis: Secondary | ICD-10-CM | POA: Insufficient documentation

## 2015-04-16 DIAGNOSIS — W1839XA Other fall on same level, initial encounter: Secondary | ICD-10-CM | POA: Insufficient documentation

## 2015-04-16 DIAGNOSIS — Y998 Other external cause status: Secondary | ICD-10-CM | POA: Diagnosis not present

## 2015-04-16 DIAGNOSIS — M1990 Unspecified osteoarthritis, unspecified site: Secondary | ICD-10-CM | POA: Diagnosis not present

## 2015-04-16 DIAGNOSIS — Z8639 Personal history of other endocrine, nutritional and metabolic disease: Secondary | ICD-10-CM | POA: Insufficient documentation

## 2015-04-16 DIAGNOSIS — S0990XA Unspecified injury of head, initial encounter: Secondary | ICD-10-CM | POA: Diagnosis not present

## 2015-04-16 MED ORDER — KETOROLAC TROMETHAMINE 60 MG/2ML IM SOLN
60.0000 mg | Freq: Once | INTRAMUSCULAR | Status: AC
  Administered 2015-04-16: 60 mg via INTRAMUSCULAR
  Filled 2015-04-16: qty 2

## 2015-04-16 MED ORDER — TRAMADOL HCL 50 MG PO TABS: 50.0000 mg | ORAL_TABLET | Freq: Four times a day (QID) | ORAL | Status: DC | PRN

## 2015-04-16 NOTE — ED Notes (Signed)
Per EMS Pt fell at 3 pm outside on the ground; denies LOC; Pt states he had increased left hip pain from normal chronic hip pain; Pt states he got up to go to the bathroom at 1am and felt dizzy; pt nausea but no active vomiting; Pt states he normally ambulates independently; Pt has hematoma right back of head; pt pain at 3/10 on arrival; pt had 4mg  zofran in route

## 2015-04-16 NOTE — ED Provider Notes (Signed)
CSN: [id number]     Arrival date & time 04/16/15  4332 History   First MD Initiated Contact with Patient 04/16/15 0441     Chief Complaint  Patient presents with  . Dizziness  . Headache     (Consider location/radiation/quality/duration/timing/severity/associated sxs/prior Treatment) Patient is a 74 y.o. male presenting with fall. The history is provided by the patient.  Fall This is a recurrent problem. The current episode started 12 to 24 hours ago. The problem occurs constantly. The problem has not changed since onset.Associated symptoms include headaches. Pertinent negatives include no chest pain, no abdominal pain and no shortness of breath. Nothing aggravates the symptoms. He has tried nothing for the symptoms. The treatment provided no relief.  Hit posterior head outside  Past Medical History  Diagnosis Date  . Kidney stone   . Hypertension   . S/P orchiectomy   . Diverticulitis   . Prostatic hypertrophy   . Gastric ulcer   . CAD (coronary artery disease)   . Esophageal stricture   . Hyperlipemia   . COPD (chronic obstructive pulmonary disease)   . Shortness of breath   . GERD (gastroesophageal reflux disease)   . MI (myocardial infarction) 2005  . Arthritis     back   Past Surgical History  Procedure Laterality Date  . Appendectomy    . Angioplasty  2005    Stent  . Thyroid surgery    . Cholecystectomy    . Cholecystectomy    . Coronary angioplasty    . Back surgery     Family History  Problem Relation Age of Onset  . Colon cancer Neg Hx   . Migraines Neg Hx   . Lung cancer Father    History  Substance Use Topics  . Smoking status: Former Smoker -- 63 years    Quit date: 12/12/1998  . Smokeless tobacco: Never Used  . Alcohol Use: No    Review of Systems  Constitutional: Negative for fever.  Respiratory: Negative for shortness of breath.   Cardiovascular: Negative for chest pain, palpitations and leg swelling.  Gastrointestinal: Negative for  abdominal pain.  Neurological: Positive for headaches. Negative for facial asymmetry, light-headedness and numbness.  All other systems reviewed and are negative.     Allergies  Codeine; Niaspan; and Statins  Home Medications   Prior to Admission medications   Medication Sig Start Date End Date Taking? Authorizing Provider  acetaminophen (TYLENOL) 500 MG tablet Take 1,000 mg by mouth every 6 (six) hours as needed for moderate pain or headache.   Yes Historical Provider, MD  aspirin 81 MG tablet Take 81 mg by mouth daily after supper.    Yes Historical Provider, MD  esomeprazole (NEXIUM) 40 MG capsule Take 40 mg by mouth daily as needed (indigestion).    Yes Historical Provider, MD  fluticasone (FLONASE) 50 MCG/ACT nasal spray Place 1 spray into both nostrils 2 (two) times daily as needed for allergies or rhinitis.    Yes Historical Provider, MD  gemfibrozil (LOPID) 600 MG tablet Take 600 mg by mouth 2 (two) times daily before a meal.   Yes Historical Provider, MD  Hyprom-Naphaz-Polysorb-Zn Sulf (CLEAR EYES COMPLETE OP) Apply 1 drop to eye 2 (two) times daily as needed (drye eyes.).    Yes Historical Provider, MD  ibuprofen (ADVIL,MOTRIN) 200 MG tablet Take 400 mg by mouth every 6 (six) hours as needed for moderate pain.   Yes Historical Provider, MD  lisinopril (PRINIVIL,ZESTRIL) 20 MG tablet Take 40 mg  by mouth daily.    Yes Historical Provider, MD  metoprolol (TOPROL-XL) 50 MG 24 hr tablet Take 50 mg by mouth daily after supper.    Yes Historical Provider, MD  nitroGLYCERIN (NITROSTAT) 0.4 MG SL tablet Place 1 tablet (0.4 mg total) under the tongue every 5 (five) minutes as needed for chest pain. 05/03/13  Yes Charolette Forward, MD  amoxicillin (AMOXIL) 500 MG capsule Take 1 capsule (500 mg total) by mouth 3 (three) times daily. Patient not taking: Reported on 02/09/2015 01/26/15   Elmyra Ricks Pisciotta, PA-C  HYDROcodone-acetaminophen (NORCO/VICODIN) 5-325 MG per tablet Take 1-2 tablets by mouth  every 6 hours as needed for pain and/or cough. Patient not taking: Reported on 02/09/2015 01/26/15   Elmyra Ricks Pisciotta, PA-C  methylPREDNISolone (MEDROL DOSEPAK) 4 MG tablet follow package directions Patient not taking: Reported on 04/16/2015 02/13/15   Melvenia Beam, MD  topiramate (TOPAMAX) 25 MG tablet Start with 1 tab before bedtime. In one week increase to 2 tabs at bedtime Patient not taking: Reported on 04/16/2015 03/02/15   Melvenia Beam, MD   BP 145/78 mmHg  Pulse 68  Temp(Src) 97.5 F (36.4 C) (Oral)  Resp 20  Ht 6\' 1"  (1.854 m)  Wt 225 lb (102.059 kg)  BMI 29.69 kg/m2  SpO2 95% Physical Exam  Constitutional: He is oriented to person, place, and time. He appears well-developed and well-nourished. No distress.  HENT:  Head: Normocephalic and atraumatic. Head is without raccoon's eyes and without Battle's sign.  Right Ear: External ear normal. No hemotympanum.  Left Ear: External ear normal. No hemotympanum.  Mouth/Throat: Oropharynx is clear and moist.  Eyes: Conjunctivae are normal. Pupils are equal, round, and reactive to light.  Neck: Normal range of motion. Neck supple.  No c spine tenderness  Cardiovascular: Normal rate, regular rhythm and intact distal pulses.   Pulmonary/Chest: Effort normal and breath sounds normal. No respiratory distress. He has no wheezes. He has no rales.  Abdominal: Soft. Bowel sounds are normal. There is no tenderness. There is no rebound and no guarding.  Pelvis stable No foreshortening or rotation.  GAIT intact FROM BLE  Musculoskeletal: Normal range of motion. He exhibits no edema or tenderness.  Neurological: He is alert and oriented to person, place, and time. He has normal reflexes. No cranial nerve deficit.  Skin: Skin is warm and dry.  Psychiatric: He has a normal mood and affect.    ED Course  Procedures (including critical care time) Labs Review Labs Reviewed - No data to display  Imaging Review Ct Head Wo Contrast  04/16/2015    CLINICAL DATA:  [place] from standing position, striking the back of his head around 15:00. Now with nausea and lightheadedness.  EXAM: CT HEAD WITHOUT CONTRAST  CT CERVICAL SPINE WITHOUT CONTRAST  TECHNIQUE: Multidetector CT imaging of the head and cervical spine was performed following the standard protocol without intravenous contrast. Multiplanar CT image reconstructions of the cervical spine were also generated.  COMPARISON:  02/03/2015  FINDINGS: CT HEAD FINDINGS  There are is bifrontal atrophy or small chronic subdural hygromas. This is unchanged. There is no intracranial hemorrhage, mass or evidence of acute infarction. There is moderate generalized atrophy. There is mild periventricular hypodensity consistent with chronic microvascular ischemic disease. No superimposed acute finding is evident. Calvarium and skullbase are intact.  CT CERVICAL SPINE FINDINGS  The vertebral column, pedicles and facet articulations are intact. There is no evidence of acute fracture. No acute soft tissue abnormalities are evident.  Severe degenerative changes are present at the atlantoaxial articulation. There are severe arthritic facet changes at several levels, greatest on the left at C6-7 where there is prominent bony exostosis deforming the central canal.  IMPRESSION: *Negative for acute intracranial traumatic injury. There is chronic atrophy and chronic microvascular disease. *Negative for acute cervical spine fracture.   Electronically Signed   By: Andreas Newport M.D.   On: 04/16/2015 05:20   Ct Cervical Spine Wo Contrast  04/16/2015   CLINICAL DATA:  [place] from standing position, striking the back of his head around 15:00. Now with nausea and lightheadedness.  EXAM: CT HEAD WITHOUT CONTRAST  CT CERVICAL SPINE WITHOUT CONTRAST  TECHNIQUE: Multidetector CT imaging of the head and cervical spine was performed following the standard protocol without intravenous contrast. Multiplanar CT image reconstructions of the cervical  spine were also generated.  COMPARISON:  02/03/2015  FINDINGS: CT HEAD FINDINGS  There are is bifrontal atrophy or small chronic subdural hygromas. This is unchanged. There is no intracranial hemorrhage, mass or evidence of acute infarction. There is moderate generalized atrophy. There is mild periventricular hypodensity consistent with chronic microvascular ischemic disease. No superimposed acute finding is evident. Calvarium and skullbase are intact.  CT CERVICAL SPINE FINDINGS  The vertebral column, pedicles and facet articulations are intact. There is no evidence of acute fracture. No acute soft tissue abnormalities are evident.  Severe degenerative changes are present at the atlantoaxial articulation. There are severe arthritic facet changes at several levels, greatest on the left at C6-7 where there is prominent bony exostosis deforming the central canal.  IMPRESSION: *Negative for acute intracranial traumatic injury. There is chronic atrophy and chronic microvascular disease. *Negative for acute cervical spine fracture.   Electronically Signed   By: Andreas Newport M.D.   On: 04/16/2015 05:20   Dg Hip Unilat With Pelvis 2-3 Views Left  04/16/2015   CLINICAL DATA:  74 year old male who fell outside yesterday. Acute on chronic left hip pain. Back pain. Initial encounter.  EXAM: LEFT HIP (WITH PELVIS) 2-3 VIEWS  COMPARISON:  CT Abdomen and Pelvis 08/10/2011.  FINDINGS: Femoral heads are normally located. Hip joint spaces appear stable. No pelvis fracture identified. SI joints within normal limits. Lumbar spine decompression changes Re identified. Grossly intact proximal right femur. Proximal left femur appears intact with small chronic ossific fragment along the lateral joint space. Small pelvic phleboliths Re identified. Calcified atherosclerosis in the left lower extremity.  IMPRESSION: No acute fracture or dislocation identified about the left hip or pelvis.   Electronically Signed   By: Genevie Ann M.D.    On: 04/16/2015 07:05     EKG Interpretation   Date/Time:  Thursday Apr 16 2015 03:48:35 EDT Ventricular Rate:  66 PR Interval:  174 QRS Duration: 67 QT Interval:  408 QTC Calculation: 427 R Axis:   64 Text Interpretation:  Sinus rhythm Confirmed by [facility]  MD, Trevin Gartrell  ([id number]) on 04/16/2015 5:31:06 AM      MDM   Final diagnoses:  Fall    Will treat with pain medication and close follow up   Jonna Dittrich, MD 04/16/15 [phone]

## 2015-04-16 NOTE — Discharge Instructions (Signed)

## 2015-04-16 NOTE — ED Notes (Signed)
Patient transported to CT 

## 2015-04-16 NOTE — ED Notes (Signed)
Patient transported to X-ray 

## 2015-04-20 ENCOUNTER — Other Ambulatory Visit: Payer: Self-pay | Admitting: Family Medicine

## 2015-04-20 DIAGNOSIS — R109 Unspecified abdominal pain: Secondary | ICD-10-CM

## 2015-04-20 DIAGNOSIS — R634 Abnormal weight loss: Secondary | ICD-10-CM

## 2015-04-23 ENCOUNTER — Ambulatory Visit
Admission: RE | Admit: 2015-04-23 | Discharge: 2015-04-23 | Disposition: A | Discharge status: Home / Self Care | Payer: Medicare HMO | Source: Ambulatory Visit | Attending: Family Medicine | Admitting: Family Medicine

## 2015-04-23 DIAGNOSIS — R109 Unspecified abdominal pain: Secondary | ICD-10-CM

## 2015-04-23 DIAGNOSIS — R634 Abnormal weight loss: Secondary | ICD-10-CM

## 2015-04-23 MED ORDER — IOHEXOL 300 MG/ML  SOLN
125.0000 mL | Freq: Once | INTRAMUSCULAR | Status: AC | PRN
  Administered 2015-04-23: 125 mL via INTRAVENOUS

## 2015-05-18 ENCOUNTER — Ambulatory Visit: Payer: Medicare HMO | Admitting: Neurology

## 2015-06-10 ENCOUNTER — Encounter: Payer: Self-pay | Admitting: Internal Medicine

## 2015-09-12 ENCOUNTER — Emergency Department (HOSPITAL_COMMUNITY)
Admission: EM | Admit: 2015-09-12 | Discharge: 2015-09-12 | Disposition: A | Discharge status: Home / Self Care | Payer: Medicare HMO | Attending: Emergency Medicine | Admitting: Emergency Medicine

## 2015-09-12 ENCOUNTER — Encounter (HOSPITAL_COMMUNITY): Payer: Self-pay | Admitting: *Deleted

## 2015-09-12 DIAGNOSIS — Z79899 Other long term (current) drug therapy: Secondary | ICD-10-CM | POA: Diagnosis not present

## 2015-09-12 DIAGNOSIS — Z87442 Personal history of urinary calculi: Secondary | ICD-10-CM | POA: Insufficient documentation

## 2015-09-12 DIAGNOSIS — I10 Essential (primary) hypertension: Secondary | ICD-10-CM | POA: Diagnosis not present

## 2015-09-12 DIAGNOSIS — K219 Gastro-esophageal reflux disease without esophagitis: Secondary | ICD-10-CM | POA: Insufficient documentation

## 2015-09-12 DIAGNOSIS — Z87891 Personal history of nicotine dependence: Secondary | ICD-10-CM | POA: Diagnosis not present

## 2015-09-12 DIAGNOSIS — I252 Old myocardial infarction: Secondary | ICD-10-CM | POA: Insufficient documentation

## 2015-09-12 DIAGNOSIS — Z87438 Personal history of other diseases of male genital organs: Secondary | ICD-10-CM | POA: Insufficient documentation

## 2015-09-12 DIAGNOSIS — J449 Chronic obstructive pulmonary disease, unspecified: Secondary | ICD-10-CM | POA: Diagnosis not present

## 2015-09-12 DIAGNOSIS — Z9861 Coronary angioplasty status: Secondary | ICD-10-CM | POA: Insufficient documentation

## 2015-09-12 DIAGNOSIS — I2510 Atherosclerotic heart disease of native coronary artery without angina pectoris: Secondary | ICD-10-CM | POA: Insufficient documentation

## 2015-09-12 DIAGNOSIS — R51 Headache: Secondary | ICD-10-CM | POA: Insufficient documentation

## 2015-09-12 DIAGNOSIS — R519 Headache, unspecified: Secondary | ICD-10-CM

## 2015-09-12 DIAGNOSIS — Z7951 Long term (current) use of inhaled steroids: Secondary | ICD-10-CM | POA: Diagnosis not present

## 2015-09-12 DIAGNOSIS — M1990 Unspecified osteoarthritis, unspecified site: Secondary | ICD-10-CM | POA: Diagnosis not present

## 2015-09-12 LAB — URINALYSIS, ROUTINE W REFLEX MICROSCOPIC
BILIRUBIN URINE: NEGATIVE
GLUCOSE, UA: NEGATIVE mg/dL
Hgb urine dipstick: NEGATIVE
KETONES UR: NEGATIVE mg/dL
Leukocytes, UA: NEGATIVE
Nitrite: NEGATIVE
PROTEIN: NEGATIVE mg/dL
Specific Gravity, Urine: 1.015 (ref 1.005–1.030)
Urobilinogen, UA: 1 mg/dL (ref 0.0–1.0)
pH: 6.5 (ref 5.0–8.0)

## 2015-09-12 MED ORDER — HYDROMORPHONE HCL 1 MG/ML IJ SOLN
1.0000 mg | Freq: Once | INTRAMUSCULAR | Status: DC
  Filled 2015-09-12: qty 1

## 2015-09-12 MED ORDER — LORAZEPAM 2 MG/ML IJ SOLN
1.0000 mg | Freq: Once | INTRAMUSCULAR | Status: AC
  Administered 2015-09-12: 1 mg via INTRAVENOUS
  Filled 2015-09-12: qty 1

## 2015-09-12 MED ORDER — DIPHENHYDRAMINE HCL 50 MG/ML IJ SOLN
25.0000 mg | Freq: Once | INTRAMUSCULAR | Status: AC
  Administered 2015-09-12: 25 mg via INTRAMUSCULAR
  Filled 2015-09-12: qty 1

## 2015-09-12 MED ORDER — MORPHINE SULFATE (PF) 4 MG/ML IV SOLN
4.0000 mg | Freq: Once | INTRAVENOUS | Status: AC
  Administered 2015-09-12: 4 mg via INTRAVENOUS
  Filled 2015-09-12: qty 1

## 2015-09-12 MED ORDER — METOCLOPRAMIDE HCL 5 MG/ML IJ SOLN
10.0000 mg | Freq: Once | INTRAMUSCULAR | Status: AC
  Administered 2015-09-12: 10 mg via INTRAMUSCULAR
  Filled 2015-09-12: qty 2

## 2015-09-12 NOTE — ED Notes (Signed)
Headache and uncontrolled HTN x 3 days

## 2015-09-12 NOTE — ED Notes (Signed)
Pt continues to complain of severe headache.  Dr. Tamera Punt notified.  No additional orders received.

## 2015-09-12 NOTE — ED Provider Notes (Signed)
CSN: [id number]     Arrival date & time 09/12/15  1655 History   First MD Initiated Contact with Patient 09/12/15 1708     Chief Complaint  Patient presents with  . Hypertension  . Headache     (Consider location/radiation/quality/duration/timing/severity/associated sxs/prior Treatment) HPI Comments: Patient presents with headache. He's had frequent headaches in the past. He states that today's headache is not as bad as his typical headaches. He states it's been bothering him for the last several days. It's a diffuse type headache. Denies any vision changes or photophobia. He denies any neck pain. There is no fevers or chills. He denies any recent head trauma. He denies any numbness or weakness to his extremities. No balance problems. No speech or vision changes. He also notes his blood pressures been elevated over the last 3 days. He has not changed his blood pressure medications recently. He's been taking his medication regularly as directed.  Patient is a 74 y.o. male presenting with hypertension and headaches.  Hypertension Associated symptoms include headaches. Pertinent negatives include no chest pain, no abdominal pain and no shortness of breath.  Headache Associated symptoms: no abdominal pain, no back pain, no congestion, no cough, no diarrhea, no dizziness, no fatigue, no fever, no nausea, no numbness, no vomiting and no weakness     Past Medical History  Diagnosis Date  . Kidney stone   . Hypertension   . S/P orchiectomy   . Diverticulitis   . Prostatic hypertrophy   . Gastric ulcer   . CAD (coronary artery disease)   . Esophageal stricture   . Hyperlipemia   . COPD (chronic obstructive pulmonary disease) ([place])   . Shortness of breath   . GERD (gastroesophageal reflux disease)   . MI (myocardial infarction) ([place]) 2005  . Arthritis     back   Past Surgical History  Procedure Laterality Date  . Appendectomy    . Angioplasty  2005    Stent  . Thyroid surgery    .  Cholecystectomy    . Cholecystectomy    . Coronary angioplasty    . Back surgery     Family History  Problem Relation Age of Onset  . Colon cancer Neg Hx   . Migraines Neg Hx   . Lung cancer Father    Social History  Substance Use Topics  . Smoking status: Former Smoker -- 57 years    Quit date: 12/12/1998  . Smokeless tobacco: Never Used  . Alcohol Use: No    Review of Systems  Constitutional: Negative for fever, chills, diaphoresis and fatigue.  HENT: Negative for congestion, rhinorrhea and sneezing.   Eyes: Negative.   Respiratory: Negative for cough, chest tightness and shortness of breath.   Cardiovascular: Negative for chest pain and leg swelling.  Gastrointestinal: Negative for nausea, vomiting, abdominal pain, diarrhea and blood in stool.  Genitourinary: Negative for frequency, hematuria, flank pain and difficulty urinating.  Musculoskeletal: Negative for back pain and arthralgias.  Skin: Negative for rash.  Neurological: Positive for headaches. Negative for dizziness, speech difficulty, weakness and numbness.      Allergies  Codeine; Niaspan; and Statins  Home Medications   Prior to Admission medications   Medication Sig Start Date End Date Taking? Authorizing Provider  acetaminophen (TYLENOL) 500 MG tablet Take 1,000 mg by mouth every 6 (six) hours as needed for moderate pain or headache.    Historical Provider, MD  amoxicillin (AMOXIL) 500 MG capsule Take 1 capsule (500 mg total) by  mouth 3 (three) times daily. Patient not taking: Reported on 02/09/2015 01/26/15   Elmyra Ricks Pisciotta, PA-C  aspirin 81 MG tablet Take 81 mg by mouth daily after supper.     Historical Provider, MD  esomeprazole (NEXIUM) 40 MG capsule Take 40 mg by mouth daily as needed (indigestion).     Historical Provider, MD  fluticasone (FLONASE) 50 MCG/ACT nasal spray Place 1 spray into both nostrils 2 (two) times daily as needed for allergies or rhinitis.     Historical Provider, MD   gemfibrozil (LOPID) 600 MG tablet Take 600 mg by mouth 2 (two) times daily before a meal.    Historical Provider, MD  HYDROcodone-acetaminophen (NORCO/VICODIN) 5-325 MG per tablet Take 1-2 tablets by mouth every 6 hours as needed for pain and/or cough. Patient not taking: Reported on 02/09/2015 01/26/15   Elmyra Ricks Pisciotta, PA-C  Hyprom-Naphaz-Polysorb-Zn Sulf (CLEAR EYES COMPLETE OP) Apply 1 drop to eye 2 (two) times daily as needed (drye eyes.).     Historical Provider, MD  ibuprofen (ADVIL,MOTRIN) 200 MG tablet Take 400 mg by mouth every 6 (six) hours as needed for moderate pain.    Historical Provider, MD  lisinopril (PRINIVIL,ZESTRIL) 20 MG tablet Take 40 mg by mouth daily.     Historical Provider, MD  methylPREDNISolone (MEDROL DOSEPAK) 4 MG tablet follow package directions Patient not taking: Reported on 04/16/2015 02/13/15   Melvenia Beam, MD  metoprolol (TOPROL-XL) 50 MG 24 hr tablet Take 50 mg by mouth daily after supper.     Historical Provider, MD  nitroGLYCERIN (NITROSTAT) 0.4 MG SL tablet Place 1 tablet (0.4 mg total) under the tongue every 5 (five) minutes as needed for chest pain. 05/03/13   Charolette Forward, MD  topiramate (TOPAMAX) 25 MG tablet Start with 1 tab before bedtime. In one week increase to 2 tabs at bedtime Patient not taking: Reported on 04/16/2015 03/02/15   Melvenia Beam, MD  traMADol (ULTRAM) 50 MG tablet Take 1 tablet (50 mg total) by mouth every 6 (six) hours as needed. 04/16/15   April Palumbo, MD   BP 133/77 mmHg  Pulse 80  Temp(Src) 98 F (36.7 C) (Oral)  Resp 19  SpO2 92% Physical Exam  Constitutional: He is oriented to person, place, and time. He appears well-developed and well-nourished.  HENT:  Head: Normocephalic and atraumatic.  Eyes: Pupils are equal, round, and reactive to light.  Neck: Normal range of motion. Neck supple.  Cardiovascular: Normal rate, regular rhythm and normal heart sounds.   Pulmonary/Chest: Effort normal and breath sounds normal. No  respiratory distress. He has no wheezes. He has no rales. He exhibits no tenderness.  Abdominal: Soft. Bowel sounds are normal. There is no tenderness. There is no rebound and no guarding.  Musculoskeletal: Normal range of motion. He exhibits no edema.  Lymphadenopathy:    He has no cervical adenopathy.  Neurological: He is alert and oriented to person, place, and time.  Motor 5 out of 5 all extremities, sensation grossly intact to light touch all extremities, cranial nerves II through XII grossly intact. No pronator drift, finger-to-nose intact.  Skin: Skin is warm and dry. No rash noted.  Psychiatric: He has a normal mood and affect.    ED Course  Procedures (including critical care time) Labs Review Labs Reviewed  URINALYSIS, ROUTINE W REFLEX MICROSCOPIC (NOT AT [facility]    Imaging Review No results found. I have personally reviewed and evaluated these images and lab results as part of my medical decision-making.  EKG Interpretation   Date/Time:  Saturday September 12 2015 17:20:10 EDT Ventricular Rate:  78 PR Interval:  183 QRS Duration: 79 QT Interval:  387 QTC Calculation: 441 R Axis:   34 Text Interpretation:  Sinus rhythm Low voltage, precordial leads since  last tracing no significant change Confirmed by Landry Lookingbill  MD, Ngozi Alvidrez ([id number])  on 09/12/2015 7:31:04 PM      MDM   Final diagnoses:  Headache disorder    Patient's headache has resolved after treatment in the ED. His blood pressure has improved. His headache is similar to episodes of headaches and these had in the past with ED visits. He denies any unusual symptoms. He has no photophobia vomiting or neurologic deficits. He has no symptoms that would be more suggestive of subarachnoid hemorrhage or meningitis. He was discharged home with his wife. Return precautions were given. He was encouraged to follow-up with his PCP on Monday if his symptoms have not improved.    Malvin Johns, MD 09/12/15 2155

## 2015-09-12 NOTE — Discharge Instructions (Signed)

## 2015-09-12 NOTE — ED Notes (Signed)
Bed: WA17 Expected date:  Expected time:  Means of arrival:  Comments: EMS- Hypertension

## 2015-09-12 NOTE — ED Notes (Signed)
Pt reports no change after administration of Morphine. He reports a headache that is located in the temples bilaterally. He reports some blurred vision. All other neuro is normal. Pt talking in complete sentences and is alert and oriented.

## 2015-09-12 NOTE — ED Notes (Signed)
MD Belfi at bedside. 

## 2015-09-12 NOTE — ED Notes (Signed)
Undocumented 20g peripheral IV in Left antecubital removed. Coban wrapped around insertion site, prior to discharge.

## 2015-11-11 ENCOUNTER — Inpatient Hospital Stay (HOSPITAL_COMMUNITY)
Admission: EM | Admit: 2015-11-11 | Discharge: 2015-11-13 | DRG: 247 | Disposition: A | Discharge status: Home / Self Care | Payer: Medicare HMO | Attending: Cardiology | Admitting: Cardiology

## 2015-11-11 ENCOUNTER — Encounter (HOSPITAL_COMMUNITY): Payer: Self-pay | Admitting: Emergency Medicine

## 2015-11-11 ENCOUNTER — Emergency Department (HOSPITAL_COMMUNITY): Payer: Medicare HMO

## 2015-11-11 DIAGNOSIS — I25110 Atherosclerotic heart disease of native coronary artery with unstable angina pectoris: Secondary | ICD-10-CM | POA: Diagnosis not present

## 2015-11-11 DIAGNOSIS — T82855A Stenosis of coronary artery stent, initial encounter: Secondary | ICD-10-CM | POA: Diagnosis present

## 2015-11-11 DIAGNOSIS — I10 Essential (primary) hypertension: Secondary | ICD-10-CM | POA: Diagnosis present

## 2015-11-11 DIAGNOSIS — Z87891 Personal history of nicotine dependence: Secondary | ICD-10-CM

## 2015-11-11 DIAGNOSIS — R079 Chest pain, unspecified: Secondary | ICD-10-CM

## 2015-11-11 DIAGNOSIS — J449 Chronic obstructive pulmonary disease, unspecified: Secondary | ICD-10-CM | POA: Diagnosis present

## 2015-11-11 DIAGNOSIS — E785 Hyperlipidemia, unspecified: Secondary | ICD-10-CM | POA: Diagnosis present

## 2015-11-11 DIAGNOSIS — Y848 Other medical procedures as the cause of abnormal reaction of the patient, or of later complication, without mention of misadventure at the time of the procedure: Secondary | ICD-10-CM | POA: Diagnosis present

## 2015-11-11 DIAGNOSIS — E119 Type 2 diabetes mellitus without complications: Secondary | ICD-10-CM | POA: Diagnosis present

## 2015-11-11 DIAGNOSIS — I200 Unstable angina: Secondary | ICD-10-CM | POA: Diagnosis present

## 2015-11-11 DIAGNOSIS — I252 Old myocardial infarction: Secondary | ICD-10-CM

## 2015-11-11 DIAGNOSIS — K219 Gastro-esophageal reflux disease without esophagitis: Secondary | ICD-10-CM | POA: Diagnosis present

## 2015-11-11 LAB — BASIC METABOLIC PANEL
ANION GAP: 7 (ref 5–15)
BUN: 20 mg/dL (ref 6–20)
CHLORIDE: 101 mmol/L (ref 101–111)
CO2: 28 mmol/L (ref 22–32)
Calcium: 9.2 mg/dL (ref 8.9–10.3)
Creatinine, Ser: 1.35 mg/dL — ABNORMAL HIGH (ref 0.61–1.24)
GFR calc Af Amer: 59 mL/min — ABNORMAL LOW (ref >60)
GFR calc non Af Amer: 51 mL/min — ABNORMAL LOW (ref >60)
Glucose, Bld: 107 mg/dL — ABNORMAL HIGH (ref 65–99)
Potassium: 3.9 mmol/L (ref 3.5–5.1)
Sodium: 136 mmol/L (ref 135–145)

## 2015-11-11 LAB — CBC
HEMATOCRIT: 42.5 % (ref 39.0–52.0)
Hemoglobin: 14.5 g/dL (ref 13.0–17.0)
MCH: 28.9 pg (ref 26.0–34.0)
MCHC: 34.1 g/dL (ref 30.0–36.0)
MCV: 84.8 fL (ref 78.0–100.0)
Platelets: 289 10*3/uL (ref 150–400)
RBC: 5.01 MIL/uL (ref 4.22–5.81)
RDW: 14.6 % (ref 11.5–15.5)
WBC: 7.1 10*3/uL (ref 4.0–10.5)

## 2015-11-11 LAB — I-STAT TROPONIN, ED: TROPONIN I, POC: 0 ng/mL (ref 0.00–0.08)

## 2015-11-11 MED ORDER — HEPARIN (PORCINE) IN NACL 100-0.45 UNIT/ML-% IJ SOLN
1450.0000 [IU]/h | INTRAMUSCULAR | Status: DC
Start: 2015-11-11 — End: 2015-11-12
  Administered 2015-11-11: 1400 [IU]/h via INTRAVENOUS
  Filled 2015-11-11: qty 250

## 2015-11-11 MED ORDER — SODIUM CHLORIDE 0.9 % IV BOLUS (SEPSIS)
1000.0000 mL | Freq: Once | INTRAVENOUS | Status: AC
  Administered 2015-11-11: 1000 mL via INTRAVENOUS

## 2015-11-11 MED ORDER — HEPARIN BOLUS VIA INFUSION
4000.0000 [IU] | Freq: Once | INTRAVENOUS | Status: AC
  Administered 2015-11-11: 4000 [IU] via INTRAVENOUS
  Filled 2015-11-11: qty 4000

## 2015-11-11 MED ORDER — GI COCKTAIL ~~LOC~~
30.0000 mL | Freq: Once | ORAL | Status: AC
  Administered 2015-11-11: 30 mL via ORAL
  Filled 2015-11-11: qty 30

## 2015-11-11 MED ORDER — ASPIRIN 81 MG PO CHEW
324.0000 mg | CHEWABLE_TABLET | Freq: Once | ORAL | Status: AC
  Administered 2015-11-11: 324 mg via ORAL
  Filled 2015-11-11: qty 4

## 2015-11-11 MED ORDER — NITROGLYCERIN 0.4 MG SL SUBL
0.4000 mg | SUBLINGUAL_TABLET | SUBLINGUAL | Status: DC | PRN
Start: 2015-11-11 — End: 2015-11-13

## 2015-11-11 NOTE — Progress Notes (Signed)
ANTICOAGULATION CONSULT NOTE - Initial Consult  Pharmacy Consult for Heparin Indication: chest pain/ACS  Allergies  Allergen Reactions  . Codeine Nausea Only    Pill form causes nausea Iv form does not  . Niaspan [Niacin Er] Other (See Comments)    Severe flushing  . Statins Rash    Headaches     Patient Measurements: Height: 6\' 1"  (185.4 cm) Weight: 221 lb (100.245 kg) IBW/kg (Calculated) : 79.9 Heparin Dosing Weight: 100 kg  Vital Signs: Temp: 98.3 F (36.8 C) (11/30 1938) Temp Source: Oral (11/30 1938) BP: 124/74 mmHg (11/30 2130) Pulse Rate: 80 (11/30 2130)  Labs:  Recent Labs  11/11/15 2010  HGB 14.5  HCT 42.5  PLT 289  CREATININE 1.35*    Estimated Creatinine Clearance: 61.6 mL/min (by C-G formula based on Cr of 1.35).   Medical History: Past Medical History  Diagnosis Date  . Kidney stone   . Hypertension   . S/P orchiectomy   . Diverticulitis   . Prostatic hypertrophy   . Gastric ulcer   . CAD (coronary artery disease)   . Esophageal stricture   . Hyperlipemia   . COPD (chronic obstructive pulmonary disease) ([place])   . Shortness of breath   . GERD (gastroesophageal reflux disease)   . MI (myocardial infarction) ([place]) 2005  . Arthritis     back    Medications:   (Not in a hospital admission) Scheduled:   Infusions:    Assessment: 74yo male with history of HTN, CAD, HLD, COPD and GERD presents with CP. Pharmacy is consult heparin for ACS/chest pain. CBC wnl, sCr 1.35, Trop neg x1.  Goal of Therapy:  Heparin level 0.3-0.7 units/ml Monitor platelets by anticoagulation protocol: Yes   Plan:  Give 4000 units bolus x 1 Start heparin infusion at 1400 units/hr Check anti-Xa level in 8 hours and daily while on heparin Continue to monitor H&H and platelets  Andrey Cota. Diona Foley, PharmD, [place] Pharmacist Pager [phone] 11/11/2015,10:32 PM

## 2015-11-11 NOTE — ED Notes (Signed)
Pt c/o mid chest and epigastric pain since 1730. Reports pain is similar to past MI in 2005 in which a stent was placed. Pain decreased after belching. Cardiologist is Dr.Harwani

## 2015-11-11 NOTE — ED Notes (Signed)
Pt denies chest pain, reports discomfort

## 2015-11-11 NOTE — ED Notes (Signed)
Pt. reports mid chest/epigastric pain with mild SOB onset this evening , denies SOB , no nausea or diaphoresis , described pain as " discomfort / gas", his cardiologist is Dr. Terrence Dupont .

## 2015-11-11 NOTE — ED Notes (Signed)
Pt given Sprite and turkey sandwich  

## 2015-11-11 NOTE — ED Notes (Signed)
Heparin verified with Maralyn Sago

## 2015-11-11 NOTE — H&P (Signed)
Francisco Collier is an 74 y.o. male.   Chief Complaint: retrosternal chest pain HPI: Patient is 74 year old male with past history significant for coronary artery disease history of anteroseptal wall myocardial infarction in 2005 status post PTCA stenting to LAD, hypertension, non-insulin-dependent diabetes mellitus controlled by diet, hyperlipidemia, remote tobacco abuse 40 pack years quit approximately 17 years ago, GERD, degenerative joint disease, came to ER complaining of retrosternal chest pain described as tightness grade 3-4/10 associated with mild shortness of breath states was noted also to have elevated blood pressure. Patient also gives history of exertional dyspnea lately. States activity is limited. States chest pain occasionally gets better with burping but feels chest pain is similar in nature when he had MI. Denies any PND orthopnea leg swelling. Denies palpitation lightheadedness or syncope. Denies any recent cardiac workup. EKG done in the ED showed normal sinus rhythm with no acute ischemic changes first set of troponin I is negative. Patient presently is chest pain-free.  Past Medical History  Diagnosis Date  . Kidney stone   . Hypertension   . S/P orchiectomy   . Diverticulitis   . Prostatic hypertrophy   . Gastric ulcer   . CAD (coronary artery disease)   . Esophageal stricture   . Hyperlipemia   . COPD (chronic obstructive pulmonary disease) ([place])   . Shortness of breath   . GERD (gastroesophageal reflux disease)   . MI (myocardial infarction) ([place]) 2005  . Arthritis     back    Past Surgical History  Procedure Laterality Date  . Appendectomy    . Angioplasty  2005    Stent  . Thyroid surgery    . Cholecystectomy    . Cholecystectomy    . Coronary angioplasty    . Back surgery      Family History  Problem Relation Age of Onset  . Colon cancer Neg Hx   . Migraines Neg Hx   . Lung cancer Father    Social History:  reports that he quit smoking about 16  years ago. He has never used smokeless tobacco. He reports that he does not drink alcohol or use illicit drugs.  Allergies:  Allergies  Allergen Reactions  . Codeine Nausea Only    Pill form causes nausea Iv form does not  . Niaspan [Niacin Er] Other (See Comments)    Severe flushing  . Statins Rash    Headaches      (Not in a hospital admission)  Results for orders placed or performed during the hospital encounter of 11/11/15 (from the past 48 hour(s))  I-stat troponin, ED (not at [facility], [facility])     Status: None   Collection Time: 11/11/15  8:02 PM  Result Value Ref Range   Troponin i, poc 0.00 0.00 - 0.08 ng/mL   Comment 3            Comment: Due to the release kinetics of cTnI, a negative result within the first hours of the onset of symptoms does not rule out myocardial infarction with certainty. If myocardial infarction is still suspected, repeat the test at appropriate intervals.   Basic metabolic panel     Status: Abnormal   Collection Time: 11/11/15  8:10 PM  Result Value Ref Range   Sodium 136 135 - 145 mmol/L   Potassium 3.9 3.5 - 5.1 mmol/L   Chloride 101 101 - 111 mmol/L   CO2 28 22 - 32 mmol/L   Glucose, Bld 107 (H) 65 - 99 mg/dL  BUN 20 6 - 20 mg/dL   Creatinine, Ser 1.35 (H) 0.61 - 1.24 mg/dL   Calcium 9.2 8.9 - 10.3 mg/dL   GFR calc non Af Amer 51 (L) >60 mL/min   GFR calc Af Amer 59 (L) >60 mL/min    Comment: (NOTE) The eGFR has been calculated using the CKD EPI equation. This calculation has not been validated in all clinical situations. eGFR's persistently <60 mL/min signify possible Chronic Kidney Disease.    Anion gap 7 5 - 15  CBC     Status: None   Collection Time: 11/11/15  8:10 PM  Result Value Ref Range   WBC 7.1 4.0 - 10.5 K/uL   RBC 5.01 4.22 - 5.81 MIL/uL   Hemoglobin 14.5 13.0 - 17.0 g/dL   HCT 42.5 39.0 - 52.0 %   MCV 84.8 78.0 - 100.0 fL   MCH 28.9 26.0 - 34.0 pg   MCHC 34.1 30.0 - 36.0 g/dL   RDW 14.6 11.5 - 15.5 %   Platelets  289 150 - 400 K/uL   Dg Chest 2 View  11/11/2015  CLINICAL DATA:  Chest pain, shortness of breath tonight. EXAM: CHEST  2 VIEW COMPARISON:  December 02, 2014 FINDINGS: The heart size and mediastinal contours are within normal limits. There is no focal infiltrate, pulmonary edema, or pleural effusion. There are degenerative joint changes of the spine. IMPRESSION: No active cardiopulmonary disease. Electronically Signed   By: Abelardo Diesel M.D.   On: 11/11/2015 21:10    Review of Systems  Constitutional: Negative for fever and chills.  Eyes: Negative for double vision and photophobia.  Respiratory: Positive for shortness of breath.   Cardiovascular: Positive for chest pain. Negative for palpitations, orthopnea and claudication.  Gastrointestinal: Negative for nausea, vomiting and abdominal pain.  Genitourinary: Negative for dysuria and urgency.  Neurological: Negative for dizziness and headaches.    Blood pressure 120/68, pulse 81, temperature 98.3 F (36.8 C), temperature source Oral, resp. rate 23, height _0  (1.854 m), weight 221 lb (100.245 kg), SpO2 97 %. Physical Exam  Constitutional: He is oriented to person, place, and time.  HENT:  Head: Normocephalic and atraumatic.  Eyes: Conjunctivae are normal. Pupils are equal, round, and reactive to light. Left eye exhibits no discharge. No scleral icterus.  Neck: Normal range of motion. Neck supple. No JVD present. No tracheal deviation present. No thyromegaly present.  Cardiovascular: Normal rate and regular rhythm.   Murmur (Soft systolic murmur noted no S3 gallop) heard. Respiratory: Effort normal and breath sounds normal. No respiratory distress. He has no wheezes. He has no rales.  GI: Soft. Bowel sounds are normal. He exhibits no distension.  Musculoskeletal: He exhibits no edema or tenderness.  Neurological: He is alert and oriented to person, place, and time.     Assessment/Plan Unstable angina rule out MI Coronary artery  disease history of anteroseptal wall MI in the past status post PTCA stenting to LAD Hypertension Diabetes mellitus Hyperlipidemia Degenerative joint disease Remote tobacco abuse COPD GERD Morbid obesity Plan As per orders Discussed with patient and his wife at length various options of treatment i.e. noninvasive stress testing versus left cardiac cath possible PTCA stenting its risk and benefits i.e. death MI stroke need for emergency CABG local vascular complications etc. and consents for PCI  Charolette Forward 11/11/2015, 10:45 PM

## 2015-11-11 NOTE — ED Provider Notes (Signed)
CSN: [id number]     Arrival date & time 11/11/15  1931 History   First MD Initiated Contact with Patient 11/11/15 2005     Chief Complaint  Patient presents with  . Chest Pain     (Consider location/radiation/quality/duration/timing/severity/associated sxs/prior Treatment) HPI   Francisco Collier is a 74 y.o. male with PMH significant for CAD, HTN, GERD, COPD, HLD, MI s/p stent in 2005 who presents with sudden onset, constant, substernal, epigastric, chest pressure and discomfort approximately 5:30 PM this evening that he states feels similar to when he had his MI back in 2005.  No meds PTA.  Relieved with belching.  Associated symptoms include SOB (chronic) and mild dizziness.  Denies N/V, diaphoresis, fever, chills, abdominal pain, or urinary symptoms.  He does not smoke.    Cardiologist: Dr. Terrence Dupont   Past Medical History  Diagnosis Date  . Kidney stone   . Hypertension   . S/P orchiectomy   . Diverticulitis   . Prostatic hypertrophy   . Gastric ulcer   . CAD (coronary artery disease)   . Esophageal stricture   . Hyperlipemia   . COPD (chronic obstructive pulmonary disease) ([place])   . Shortness of breath   . GERD (gastroesophageal reflux disease)   . MI (myocardial infarction) ([place]) 2005  . Arthritis     back   Past Surgical History  Procedure Laterality Date  . Appendectomy    . Angioplasty  2005    Stent  . Thyroid surgery    . Cholecystectomy    . Cholecystectomy    . Coronary angioplasty    . Back surgery     Family History  Problem Relation Age of Onset  . Colon cancer Neg Hx   . Migraines Neg Hx   . Lung cancer Father    Social History  Substance Use Topics  . Smoking status: Former Smoker -- 78 years    Quit date: 12/12/1998  . Smokeless tobacco: Never Used  . Alcohol Use: No    Review of Systems  All other systems negative unless otherwise stated in HPI   Allergies  Codeine; Niaspan; and Statins  Home Medications   Prior to Admission  medications   Medication Sig Start Date End Date Taking? Authorizing Provider  amLODipine (NORVASC) 5 MG tablet Take 5 mg by mouth daily.   Yes Historical Provider, MD  aspirin 81 MG tablet Take 81 mg by mouth every morning.    Yes Historical Provider, MD  fenofibrate 160 MG tablet Take 160 mg by mouth daily.   Yes Historical Provider, MD  fluticasone (FLONASE) 50 MCG/ACT nasal spray Place 1 spray into both nostrils 2 (two) times daily as needed for allergies or rhinitis.    Yes Historical Provider, MD  lisinopril-hydrochlorothiazide (PRINZIDE,ZESTORETIC) 20-12.5 MG tablet Take 1 tablet by mouth daily.   Yes Historical Provider, MD  meloxicam (MOBIC) 15 MG tablet Take 15 mg by mouth daily.   Yes Historical Provider, MD  nitroGLYCERIN (NITROSTAT) 0.4 MG SL tablet Place 1 tablet (0.4 mg total) under the tongue every 5 (five) minutes as needed for chest pain. 05/03/13  Yes Charolette Forward, MD  amoxicillin (AMOXIL) 500 MG capsule Take 1 capsule (500 mg total) by mouth 3 (three) times daily. Patient not taking: Reported on 02/09/2015 01/26/15   Elmyra Ricks Pisciotta, PA-C  HYDROcodone-acetaminophen (NORCO/VICODIN) 5-325 MG per tablet Take 1-2 tablets by mouth every 6 hours as needed for pain and/or cough. Patient not taking: Reported on 02/09/2015 01/26/15  Nicole Pisciotta, PA-C  methylPREDNISolone (MEDROL DOSEPAK) 4 MG tablet follow package directions Patient not taking: Reported on 04/16/2015 02/13/15   Melvenia Beam, MD  topiramate (TOPAMAX) 25 MG tablet Start with 1 tab before bedtime. In one week increase to 2 tabs at bedtime Patient not taking: Reported on 04/16/2015 03/02/15   Melvenia Beam, MD  traMADol (ULTRAM) 50 MG tablet Take 1 tablet (50 mg total) by mouth every 6 (six) hours as needed. 04/16/15   April Palumbo, MD   BP 120/68 mmHg  Pulse 81  Temp(Src) 98.3 F (36.8 C) (Oral)  Resp 23  Ht 6\' 1"  (1.854 m)  Wt 100.245 kg  BMI 29.16 kg/m2  SpO2 97% Physical Exam  Constitutional: He is oriented to  person, place, and time. He appears well-developed and well-nourished.  HENT:  Head: Normocephalic and atraumatic.  Mouth/Throat: Oropharynx is clear and moist.  Eyes: Conjunctivae are normal. Pupils are equal, round, and reactive to light.  Neck: Normal range of motion. Neck supple.  Cardiovascular: Normal rate, regular rhythm, normal heart sounds and intact distal pulses.   No murmur heard. Pulses:      Radial pulses are 2+ on the right side, and 2+ on the left side.  Pulmonary/Chest: Effort normal and breath sounds normal. No accessory muscle usage or stridor. No respiratory distress. He has no wheezes. He has no rhonchi. He has no rales.  Abdominal: Soft. Bowel sounds are normal. He exhibits no distension. There is no tenderness.  Musculoskeletal: Normal range of motion.  Lymphadenopathy:    He has no cervical adenopathy.  Neurological: He is alert and oriented to person, place, and time.  Speech clear without dysarthria.  Skin: Skin is warm and dry.  Psychiatric: He has a normal mood and affect. His behavior is normal.    ED Course  Procedures (including critical care time) Labs Review Labs Reviewed  BASIC METABOLIC PANEL - Abnormal; Notable for the following:    Glucose, Bld 107 (*)    Creatinine, Ser 1.35 (*)    GFR calc non Af Amer 51 (*)    GFR calc Af Amer 59 (*)    All other components within normal limits  CBC  HEPARIN LEVEL (UNFRACTIONATED)  CBC  I-STAT TROPOININ, ED    Imaging Review Dg Chest 2 View  11/11/2015  CLINICAL DATA:  Chest pain, shortness of breath tonight. EXAM: CHEST  2 VIEW COMPARISON:  December 02, 2014 FINDINGS: The heart size and mediastinal contours are within normal limits. There is no focal infiltrate, pulmonary edema, or pleural effusion. There are degenerative joint changes of the spine. IMPRESSION: No active cardiopulmonary disease. Electronically Signed   By: Abelardo Diesel M.D.   On: 11/11/2015 21:10   I have personally reviewed and  evaluated these images and lab results as part of my medical decision-making.   EKG Interpretation   Date/Time:  Wednesday November 11 2015 19:32:14 EST Ventricular Rate:  102 PR Interval:  180 QRS Duration: 78 QT Interval:  338 QTC Calculation: 440 R Axis:   70 Text Interpretation:  Sinus tachycardia Otherwise normal ECG No  significant change since last tracing Confirmed by STEINL  MD, Lennette Bihari  ([id number]) on 11/11/2015 8:04:46 PM      MDM   Final diagnoses:  Atypical chest pain    Patient presents with substernal and mid-epigastric chest pressure that began approximately 5:30 PM this evening.  Similar to when he had his MI back in 2005.  VSS, NAD.  On exam,  heart RRR, lungs CTAB, abdomen soft and benign.  Will give ASA and SL NTG.  Will obtain CXR, BMP, CBC, and troponin.  HEART score 4--will likely admit for further evaluation.  Will also give GI cocktail.    -Troponin 0.00.  EKG shows sinus tachycardia; otherwise normal EKG. -BMP shows Cr 1.35, slightly elevated from previous 0.96 9 months ago. -CBC unremarkable.  Per Dr. Anne Ng, will start IV heparin and IV NTG.  Patient not experiencing chest pain at this time, so will hold IV NTG.  Will admit to cardiology for ACS r/o.  Case has been discussed with and seen by Dr. Ashok Cordia who agrees with the above plan for admission.   Gloriann Loan, PA-C 11/11/15 2312  Lajean Saver, MD 11/11/15 [phone]

## 2015-11-12 ENCOUNTER — Encounter (HOSPITAL_COMMUNITY): Payer: Self-pay | Admitting: Cardiology

## 2015-11-12 ENCOUNTER — Encounter (HOSPITAL_COMMUNITY)
Admission: EM | Disposition: A | Discharge status: Home / Self Care | Payer: Medicare HMO | Source: Home / Self Care | Attending: Cardiology

## 2015-11-12 DIAGNOSIS — E785 Hyperlipidemia, unspecified: Secondary | ICD-10-CM | POA: Diagnosis present

## 2015-11-12 DIAGNOSIS — Z87891 Personal history of nicotine dependence: Secondary | ICD-10-CM | POA: Diagnosis not present

## 2015-11-12 DIAGNOSIS — I252 Old myocardial infarction: Secondary | ICD-10-CM | POA: Diagnosis not present

## 2015-11-12 DIAGNOSIS — K219 Gastro-esophageal reflux disease without esophagitis: Secondary | ICD-10-CM | POA: Diagnosis present

## 2015-11-12 DIAGNOSIS — I25110 Atherosclerotic heart disease of native coronary artery with unstable angina pectoris: Secondary | ICD-10-CM | POA: Diagnosis present

## 2015-11-12 DIAGNOSIS — I10 Essential (primary) hypertension: Secondary | ICD-10-CM | POA: Diagnosis present

## 2015-11-12 DIAGNOSIS — E119 Type 2 diabetes mellitus without complications: Secondary | ICD-10-CM | POA: Diagnosis present

## 2015-11-12 DIAGNOSIS — J449 Chronic obstructive pulmonary disease, unspecified: Secondary | ICD-10-CM | POA: Diagnosis present

## 2015-11-12 DIAGNOSIS — T82855A Stenosis of coronary artery stent, initial encounter: Secondary | ICD-10-CM | POA: Diagnosis present

## 2015-11-12 DIAGNOSIS — Y848 Other medical procedures as the cause of abnormal reaction of the patient, or of later complication, without mention of misadventure at the time of the procedure: Secondary | ICD-10-CM | POA: Diagnosis present

## 2015-11-12 DIAGNOSIS — R079 Chest pain, unspecified: Secondary | ICD-10-CM | POA: Diagnosis present

## 2015-11-12 HISTORY — PX: CARDIAC CATHETERIZATION: SHX172

## 2015-11-12 LAB — GLUCOSE, CAPILLARY
GLUCOSE-CAPILLARY: 92 mg/dL (ref 65–99)
Glucose-Capillary: 86 mg/dL (ref 65–99)
Glucose-Capillary: 81 mg/dL (ref 65–99)
Glucose-Capillary: 115 mg/dL — ABNORMAL HIGH (ref 65–99)

## 2015-11-12 LAB — CBC
HEMATOCRIT: 36.4 % — AB (ref 39.0–52.0)
HEMOGLOBIN: 12.7 g/dL — AB (ref 13.0–17.0)
MCH: 29.3 pg (ref 26.0–34.0)
MCHC: 34.9 g/dL (ref 30.0–36.0)
MCV: 84.1 fL (ref 78.0–100.0)
Platelets: 224 10*3/uL (ref 150–400)
RBC: 4.33 MIL/uL (ref 4.22–5.81)
RDW: 14.8 % (ref 11.5–15.5)
WBC: 5.3 10*3/uL (ref 4.0–10.5)

## 2015-11-12 LAB — TROPONIN I: Troponin I: <0.03 ng/mL (ref <0.031)

## 2015-11-12 LAB — COMPREHENSIVE METABOLIC PANEL
ALK PHOS: 38 U/L (ref 38–126)
ALT: 20 U/L (ref 17–63)
AST: 19 U/L (ref 15–41)
Albumin: 3.2 g/dL — ABNORMAL LOW (ref 3.5–5.0)
Anion gap: 8 (ref 5–15)
BUN: 19 mg/dL (ref 6–20)
CALCIUM: 8.3 mg/dL — AB (ref 8.9–10.3)
CO2: 25 mmol/L (ref 22–32)
CREATININE: 1.29 mg/dL — AB (ref 0.61–1.24)
Chloride: 104 mmol/L (ref 101–111)
GFR, EST NON AFRICAN AMERICAN: 54 mL/min — AB (ref >60)
Glucose, Bld: 82 mg/dL (ref 65–99)
Potassium: 4 mmol/L (ref 3.5–5.1)
Sodium: 137 mmol/L (ref 135–145)
Total Bilirubin: 0.6 mg/dL (ref 0.3–1.2)
Total Protein: 5.5 g/dL — ABNORMAL LOW (ref 6.5–8.1)

## 2015-11-12 LAB — HEPARIN LEVEL (UNFRACTIONATED): Heparin Unfractionated: 0.3 IU/mL (ref 0.30–0.70)

## 2015-11-12 LAB — LIPID PANEL
CHOLESTEROL: 119 mg/dL (ref 0–200)
HDL: 22 mg/dL — AB (ref >40)
LDL Cholesterol: 78 mg/dL (ref 0–99)
TRIGLYCERIDES: 97 mg/dL (ref <150)
Total CHOL/HDL Ratio: 5.4 RATIO
VLDL: 19 mg/dL (ref 0–40)

## 2015-11-12 LAB — PROTIME-INR
INR: 1.08 (ref 0.00–1.49)
PROTHROMBIN TIME: 14.2 s (ref 11.6–15.2)

## 2015-11-12 LAB — POCT ACTIVATED CLOTTING TIME: Activated Clotting Time: 398 seconds

## 2015-11-12 SURGERY — LEFT HEART CATH AND CORONARY ANGIOGRAPHY
Anesthesia: LOCAL

## 2015-11-12 MED ORDER — TICAGRELOR 90 MG PO TABS
ORAL_TABLET | ORAL | Status: DC | PRN
  Administered 2015-11-12: 180 mg via ORAL

## 2015-11-12 MED ORDER — MIDAZOLAM HCL 2 MG/2ML IJ SOLN
INTRAMUSCULAR | Status: AC
  Filled 2015-11-12: qty 2

## 2015-11-12 MED ORDER — CLOPIDOGREL BISULFATE 75 MG PO TABS
300.0000 mg | ORAL_TABLET | Freq: Once | ORAL | Status: AC
  Administered 2015-11-12: 300 mg via ORAL
  Filled 2015-11-12: qty 4

## 2015-11-12 MED ORDER — MIDAZOLAM HCL 2 MG/2ML IJ SOLN
INTRAMUSCULAR | Status: DC | PRN
  Administered 2015-11-12 (×2): 1 mg via INTRAVENOUS

## 2015-11-12 MED ORDER — METOPROLOL TARTRATE 25 MG PO TABS
25.0000 mg | ORAL_TABLET | Freq: Two times a day (BID) | ORAL | Status: DC
  Administered 2015-11-12 – 2015-11-13 (×3): 25 mg via ORAL
  Filled 2015-11-12 (×3): qty 1

## 2015-11-12 MED ORDER — BIVALIRUDIN 250 MG IV SOLR
250.0000 mg | INTRAVENOUS | Status: DC | PRN
  Administered 2015-11-12 (×2): 1.75 mg/kg/h via INTRAVENOUS

## 2015-11-12 MED ORDER — IOHEXOL 350 MG/ML SOLN
INTRAVENOUS | Status: DC | PRN
  Administered 2015-11-12: 180 mL via INTRAVENOUS

## 2015-11-12 MED ORDER — LIDOCAINE HCL (PF) 1 % IJ SOLN
INTRAMUSCULAR | Status: AC
  Filled 2015-11-12: qty 30

## 2015-11-12 MED ORDER — SODIUM CHLORIDE 0.9 % IV SOLN: 250.0000 mL | INTRAVENOUS | Status: DC | PRN

## 2015-11-12 MED ORDER — SODIUM CHLORIDE 0.9 % IV SOLN: INTRAVENOUS | Status: AC

## 2015-11-12 MED ORDER — ASPIRIN 81 MG PO CHEW
81.0000 mg | CHEWABLE_TABLET | Freq: Every day | ORAL | Status: DC
  Administered 2015-11-13: 81 mg via ORAL
  Filled 2015-11-12: qty 1

## 2015-11-12 MED ORDER — SODIUM CHLORIDE 0.9 % IJ SOLN: 3.0000 mL | INTRAMUSCULAR | Status: DC | PRN

## 2015-11-12 MED ORDER — NITROGLYCERIN IN D5W 200-5 MCG/ML-% IV SOLN
5.0000 ug/min | INTRAVENOUS | Status: DC
  Administered 2015-11-12: 5 ug/min via INTRAVENOUS
  Filled 2015-11-12: qty 250

## 2015-11-12 MED ORDER — FENTANYL CITRATE (PF) 100 MCG/2ML IJ SOLN
INTRAMUSCULAR | Status: AC
Start: 2015-11-12 — End: 2015-11-12
  Filled 2015-11-12: qty 2

## 2015-11-12 MED ORDER — BIVALIRUDIN 250 MG IV SOLR
INTRAVENOUS | Status: AC
  Filled 2015-11-12: qty 250

## 2015-11-12 MED ORDER — TICAGRELOR 90 MG PO TABS
ORAL_TABLET | ORAL | Status: AC
  Filled 2015-11-12: qty 2

## 2015-11-12 MED ORDER — NITROGLYCERIN 2 % TD OINT
0.5000 [in_us] | TOPICAL_OINTMENT | Freq: Four times a day (QID) | TRANSDERMAL | Status: DC
  Administered 2015-11-12 (×2): 0.5 [in_us] via TOPICAL
  Filled 2015-11-12: qty 30

## 2015-11-12 MED ORDER — ANGIOPLASTY BOOK
Freq: Once | Status: AC
  Administered 2015-11-12: 23:00:00
  Filled 2015-11-12: qty 1

## 2015-11-12 MED ORDER — NITROGLYCERIN 1 MG/10 ML FOR IR/CATH LAB
INTRA_ARTERIAL | Status: DC | PRN
  Administered 2015-11-12 (×2): 100 ug via INTRACORONARY

## 2015-11-12 MED ORDER — BIVALIRUDIN BOLUS VIA INFUSION - CUPID
INTRAVENOUS | Status: DC | PRN
  Administered 2015-11-12: 74.1 mg via INTRAVENOUS

## 2015-11-12 MED ORDER — ASPIRIN 81 MG PO CHEW
81.0000 mg | CHEWABLE_TABLET | ORAL | Status: AC
  Administered 2015-11-12: 81 mg via ORAL
  Filled 2015-11-12: qty 1

## 2015-11-12 MED ORDER — ONDANSETRON HCL 4 MG/2ML IJ SOLN: 4.0000 mg | Freq: Four times a day (QID) | INTRAMUSCULAR | Status: DC | PRN

## 2015-11-12 MED ORDER — SODIUM CHLORIDE 0.9 % WEIGHT BASED INFUSION
3.0000 mL/kg/h | INTRAVENOUS | Status: DC
  Administered 2015-11-12: 3 mL/kg/h via INTRAVENOUS

## 2015-11-12 MED ORDER — ASPIRIN EC 81 MG PO TBEC: 81.0000 mg | DELAYED_RELEASE_TABLET | Freq: Every day | ORAL | Status: DC

## 2015-11-12 MED ORDER — NITROGLYCERIN 1 MG/10 ML FOR IR/CATH LAB
INTRA_ARTERIAL | Status: DC | PRN
  Administered 2015-11-12: 11:00:00

## 2015-11-12 MED ORDER — ATROPINE SULFATE 0.1 MG/ML IJ SOLN
INTRAMUSCULAR | Status: AC
  Filled 2015-11-12: qty 10

## 2015-11-12 MED ORDER — SODIUM CHLORIDE 0.9 % IJ SOLN
3.0000 mL | Freq: Two times a day (BID) | INTRAMUSCULAR | Status: DC
Start: 2015-11-12 — End: 2015-11-12

## 2015-11-12 MED ORDER — PANTOPRAZOLE SODIUM 40 MG PO TBEC
40.0000 mg | DELAYED_RELEASE_TABLET | Freq: Every day | ORAL | Status: DC
  Administered 2015-11-12 – 2015-11-13 (×2): 40 mg via ORAL
  Filled 2015-11-12 (×2): qty 1

## 2015-11-12 MED ORDER — HEPARIN (PORCINE) IN NACL 2-0.9 UNIT/ML-% IJ SOLN
INTRAMUSCULAR | Status: AC
  Filled 2015-11-12: qty 1000

## 2015-11-12 MED ORDER — TICAGRELOR 90 MG PO TABS
90.0000 mg | ORAL_TABLET | Freq: Two times a day (BID) | ORAL | Status: DC
  Administered 2015-11-12 – 2015-11-13 (×2): 90 mg via ORAL
  Filled 2015-11-12 (×2): qty 1

## 2015-11-12 MED ORDER — ACETAMINOPHEN 325 MG PO TABS: 650.0000 mg | ORAL_TABLET | ORAL | Status: DC | PRN

## 2015-11-12 MED ORDER — FLUTICASONE PROPIONATE 50 MCG/ACT NA SUSP: 1.0000 | Freq: Two times a day (BID) | NASAL | Status: DC | PRN

## 2015-11-12 MED ORDER — NITROGLYCERIN 1 MG/10 ML FOR IR/CATH LAB
INTRA_ARTERIAL | Status: AC
Start: 2015-11-12 — End: 2015-11-12
  Filled 2015-11-12: qty 10

## 2015-11-12 MED ORDER — FENOFIBRATE 160 MG PO TABS
160.0000 mg | ORAL_TABLET | Freq: Every day | ORAL | Status: DC
  Administered 2015-11-13: 160 mg via ORAL
  Filled 2015-11-12: qty 1

## 2015-11-12 MED ORDER — FENTANYL CITRATE (PF) 100 MCG/2ML IJ SOLN
INTRAMUSCULAR | Status: DC | PRN
  Administered 2015-11-12 (×2): 25 ug via INTRAVENOUS

## 2015-11-12 MED ORDER — INSULIN ASPART 100 UNIT/ML ~~LOC~~ SOLN: 0.0000 [IU] | Freq: Three times a day (TID) | SUBCUTANEOUS | Status: DC

## 2015-11-12 MED ORDER — SODIUM CHLORIDE 0.9 % IV SOLN: INTRAVENOUS | Status: DC | PRN

## 2015-11-12 MED ORDER — SODIUM CHLORIDE 0.9 % IJ SOLN
3.0000 mL | Freq: Two times a day (BID) | INTRAMUSCULAR | Status: DC
  Administered 2015-11-12 (×2): 3 mL via INTRAVENOUS

## 2015-11-12 MED ORDER — SODIUM CHLORIDE 0.9 % WEIGHT BASED INFUSION
1.0000 mL/kg/h | INTRAVENOUS | Status: DC
  Administered 2015-11-12: 1 mL/kg/h via INTRAVENOUS

## 2015-11-12 SURGICAL SUPPLY — 20 items
BALLN EMERGE MR 3.0X12 (BALLOONS) ×2
BALLN ~~LOC~~ EMERGE MR 4.5X15 (BALLOONS) ×2
BALLN ~~LOC~~ EMERGE MR 5.0X12 (BALLOONS) ×2
BALLOON EMERGE MR 3.0X12 (BALLOONS) ×1 IMPLANT
BALLOON ~~LOC~~ EMERGE MR 4.5X15 (BALLOONS) IMPLANT
BALLOON ~~LOC~~ EMERGE MR 5.0X12 (BALLOONS) ×1 IMPLANT
CATH INFINITI 5FR MULTPACK ANG (CATHETERS) ×2 IMPLANT
CATH VISTA GUIDE 6FR JR4 (CATHETERS) ×1 IMPLANT
KIT ENCORE 26 ADVANTAGE (KITS) ×1 IMPLANT
KIT HEART LEFT (KITS) ×2 IMPLANT
PACK CARDIAC CATHETERIZATION (CUSTOM PROCEDURE TRAY) ×2 IMPLANT
SHEATH PINNACLE 5F 10CM (SHEATH) ×2 IMPLANT
SHEATH PINNACLE 6F 10CM (SHEATH) ×1 IMPLANT
STENT XIENCE ALPINE RX 4.0X15 (Permanent Stent) ×2 IMPLANT
STENT XIENCE ALPINE RX 4.0X28 (Permanent Stent) ×1 IMPLANT
SYR MEDRAD MARK V 150ML (SYRINGE) ×2 IMPLANT
TRANSDUCER W/STOPCOCK (MISCELLANEOUS) ×2 IMPLANT
WIRE EMERALD 3MM-J .035X260CM (WIRE) ×1 IMPLANT
WIRE MAILMAN 182CM (WIRE) ×2 IMPLANT
WIRE RUNTHROUGH .014X180CM (WIRE) ×1 IMPLANT

## 2015-11-12 NOTE — Progress Notes (Signed)
Nutrition Brief Note  Patient identified on the Malnutrition Screening Tool (MST) Report.  Pt has had a 4% weight loss since May 2016; not significant for time frame.  Wt Readings from Last 15 Encounters:  11/11/15 217 lb 14.4 oz (98.839 kg)  04/16/15 225 lb (102.059 kg)  02/13/15 222 lb (100.699 kg)  02/09/15 227 lb (102.967 kg)  02/03/15 230 lb (104.327 kg)  12/31/14 226 lb (102.513 kg)  12/17/14 226 lb (102.513 kg)  09/28/13 222 lb 1.6 oz (100.744 kg)  05/03/13 235 lb 8 oz (106.822 kg)  07/29/11 227 lb (102.967 kg)  09/06/10 232 lb (105.235 kg)  09/30/09 232 lb (105.235 kg)    Body mass index is 27.96 kg/(m^2). Patient meets criteria for Overweight based on current BMI.   Current diet order is Heart Healthy. Labs and medications reviewed.   No nutrition interventions warranted at this time. If nutrition issues arise, please consult RD.   Arthur Holms, RD, LDN Pager #: [phone] After-Hours Pager #: [phone]

## 2015-11-12 NOTE — Progress Notes (Signed)
Subjective:  Seen earlier today doing well.  Denies any chest pain or shortness of breath.  Tolerated procedure well  Objective:  Vital Signs in the last 24 hours: Temp:  [97.4 F (36.3 C)-98.3 F (36.8 C)] 97.8 F (36.6 C) (12/01 1600) Pulse Rate:  [0-228] 78 (12/01 1600) Resp:  [0-61] 25 (12/01 1600) BP: (90-167)/(50-88) 90/50 mmHg (12/01 1600) SpO2:  [0 %-100 %] 99 % (12/01 1600) Weight:  [217 lb 14.4 oz (98.839 kg)-221 lb (100.245 kg)] 217 lb 14.4 oz (98.839 kg) (11/30 2356)  Intake/Output from previous day: 11/30 0701 - 12/01 0700 In: 1000 [I.V.:1000] Out: -  Intake/Output from this shift: Total I/O In: 240 [P.O.:240] Out: -   Physical Exam: Neck: no adenopathy, no carotid bruit, no JVD and supple, symmetrical, trachea midline Lungs: clear to auscultation bilaterally Heart: regular rate and rhythm, S1, S2 normal and soft systolic murmur noted Abdomen: soft, non-tender; bowel sounds normal; no masses,  no organomegaly Extremities: extremities normal, atraumatic, no cyanosis or edema and right groin stable  Lab Results:  Recent Labs  11/11/15 2010 11/12/15 0630  WBC 7.1 5.3  HGB 14.5 12.7*  PLT 289 224    Recent Labs  11/11/15 2010 11/12/15 0630  NA 136 137  K 3.9 4.0  CL 101 104  CO2 28 25  GLUCOSE 107* 82  BUN 20 19  CREATININE 1.35* 1.29*    Recent Labs  11/12/15 0630 11/12/15 1320  TROPONINI <0.03 <0.03   Hepatic Function Panel  Recent Labs  11/12/15 0630  PROT 5.5*  ALBUMIN 3.2*  AST 19  ALT 20  ALKPHOS 38  BILITOT 0.6    Recent Labs  11/12/15 0630  CHOL 119   No results for input(s): PROTIME in the last 72 hours.  Imaging: Imaging results have been reviewed and Dg Chest 2 View  11/11/2015  CLINICAL DATA:  Chest pain, shortness of breath tonight. EXAM: CHEST  2 VIEW COMPARISON:  December 02, 2014 FINDINGS: The heart size and mediastinal contours are within normal limits. There is no focal infiltrate, pulmonary edema, or  pleural effusion. There are degenerative joint changes of the spine. IMPRESSION: No active cardiopulmonary disease. Electronically Signed   By: Abelardo Diesel M.D.   On: 11/11/2015 21:10    Cardiac Studies:  Assessment/Plan:  Unstable angina MI ruled out, status post cardiac catheterization/PTCA stenting to proximal and mid RCA with excellent angiographic results. Coronary artery disease history of anteroseptal wall MI in the past status post PTCA stenting to LAD Hypertension Diabetes mellitus Hyperlipidemia Degenerative joint disease Remote tobacco abuse COPD GERD Morbid obesity Plan Continue present management. Check labs in a.m.  LOS: 0 days    Charolette Forward 11/12/2015, 5:01 PM

## 2015-11-12 NOTE — Progress Notes (Signed)
Site area: right groin  Site Prior to Removal:  Level 0  Pressure Applied For 45 MINUTES    Minutes Beginning at 1455  Manual:   Yes.    Patient Status During Pull:  AAO X 4  Post Pull Groin Site:  Level 0  Post Pull Instructions Given:  Yes.    Post Pull Pulses Present:  Yes.    Dressing Applied:  Yes.    Comments:  Tolerated procedure well

## 2015-11-12 NOTE — Progress Notes (Addendum)
[place] for Heparin Indication: chest pain/ACS  Allergies  Allergen Reactions  . Codeine Nausea Only    Pill form causes nausea Iv form does not  . Niaspan [Niacin Er] Other (See Comments)    Severe flushing  . Statins Rash    Headaches     Patient Measurements: Height: 6\' 2"  (188 cm) Weight: 217 lb 14.4 oz (98.839 kg) IBW/kg (Calculated) : 82.2 Heparin Dosing Weight: 100 kg  Vital Signs: Temp: 97.7 F (36.5 C) (12/01 0621) Temp Source: Oral (12/01 0621) BP: 108/63 mmHg (12/01 0621) Pulse Rate: 70 (12/01 0621)  Labs:  Recent Labs  11/11/15 2010 11/12/15 0127 11/12/15 0630  HGB 14.5  --  12.7*  HCT 42.5  --  36.4*  PLT 289  --  224  LABPROT  --   --  14.2  INR  --   --  1.08  HEPARINUNFRC  --   --  0.30  CREATININE 1.35*  --  1.29*  TROPONINI  --  <0.03 <0.03    Estimated Creatinine Clearance: 65 mL/min (by C-G formula based on Cr of 1.29).   Medical History: Past Medical History  Diagnosis Date  . Kidney stone   . Hypertension   . S/P orchiectomy   . Diverticulitis   . Prostatic hypertrophy   . Gastric ulcer   . CAD (coronary artery disease)   . Esophageal stricture   . Hyperlipemia   . COPD (chronic obstructive pulmonary disease) ([place])   . Shortness of breath   . GERD (gastroesophageal reflux disease)   . MI (myocardial infarction) ([place]) 2005  . Arthritis     back    Medications:  Prescriptions prior to admission  Medication Sig Dispense Refill Last Dose  . amLODipine (NORVASC) 5 MG tablet Take 5 mg by mouth daily.   11/11/2015 at Unknown time  . aspirin 81 MG tablet Take 81 mg by mouth every morning.    11/11/2015 at Unknown time  . fenofibrate 160 MG tablet Take 160 mg by mouth daily.   11/11/2015 at Unknown time  . fluticasone (FLONASE) 50 MCG/ACT nasal spray Place 1 spray into both nostrils 2 (two) times daily as needed for allergies or rhinitis.    Past Week at Unknown time  .  lisinopril-hydrochlorothiazide (PRINZIDE,ZESTORETIC) 20-12.5 MG tablet Take 1 tablet by mouth daily.   11/11/2015 at Unknown time  . meloxicam (MOBIC) 15 MG tablet Take 15 mg by mouth daily.   11/11/2015 at Unknown time  . nitroGLYCERIN (NITROSTAT) 0.4 MG SL tablet Place 1 tablet (0.4 mg total) under the tongue every 5 (five) minutes as needed for chest pain. 25 tablet 12 not used   Scheduled:  . [START ON 11/13/2015] aspirin EC  81 mg Oral Daily  . fenofibrate  160 mg Oral Daily  . insulin aspart  0-9 Units Subcutaneous TID WC  . metoprolol tartrate  25 mg Oral BID  . nitroGLYCERIN  0.5 inch Topical 4 times per day  . pantoprazole  40 mg Oral Q0600  . sodium chloride  3 mL Intravenous Q12H   Infusions:  . sodium chloride 1 mL/kg/hr (11/12/15 0729)  . heparin 1,400 Units/hr (11/11/15 2312)    Assessment: 74yo male with history of HTN, CAD, HLD, COPD and GERD presents with CP. Pharmacy is consult heparin for ACS/chest pain (no anticoag pta). CBC ok. No bleed/IV line issues per RN. HL therapeutic on 1400 units/h but at bottom of range. Will increase slightly to keep  in range. PCI for this AM.  Goal of Therapy:  Heparin level 0.3-0.7 units/ml Monitor platelets by anticoagulation protocol: Yes   Plan:  Increase heparin infusion slightly to 1450 units/hr to keep in range Daily HL/CBC Monitor s/sx bleeding PCI for this AM  Elicia Lamp, PharmD, [facility] Clinical Pharmacist Pager [phone] 11/12/2015 8:30 AM

## 2015-11-12 NOTE — Interval H&P Note (Signed)
Cath Lab Visit (complete for each Cath Lab visit)  Clinical Evaluation Leading to the Procedure:   ACS: Yes.    Non-ACS:    Anginal Classification: CCS IV  Anti-ischemic medical therapy: Maximal Therapy (2 or more classes of medications)  Non-Invasive Test Results: No non-invasive testing performed  Prior CABG: No previous CABG      History and Physical Interval Note:  11/12/2015 9:11 AM  [name]  has presented today for surgery, with the diagnosis of unstable angina  The various methods of treatment have been discussed with the patient and family. After consideration of risks, benefits and other options for treatment, the patient has consented to  Procedure(s): Left Heart Cath and Coronary Angiography (N/A) as a surgical intervention .  The patient's history has been reviewed, patient examined, no change in status, stable for surgery.  I have reviewed the patient's chart and labs.  Questions were answered to the patient's satisfaction.     Charolette Forward

## 2015-11-12 NOTE — Plan of Care (Signed)
Problem: Phase I Progression Outcomes Goal: Anginal pain relieved Outcome: Completed/Met Date Met:  11/12/15 Patient admitted to [facility] rating chest pain 0/10

## 2015-11-13 LAB — HEMOGLOBIN A1C
HEMOGLOBIN A1C: 5.4 % (ref 4.8–5.6)
Mean Plasma Glucose: 108 mg/dL

## 2015-11-13 LAB — BASIC METABOLIC PANEL
Anion gap: 9 (ref 5–15)
BUN: 12 mg/dL (ref 6–20)
CALCIUM: 8.5 mg/dL — AB (ref 8.9–10.3)
CHLORIDE: 102 mmol/L (ref 101–111)
CO2: 26 mmol/L (ref 22–32)
CREATININE: 1.19 mg/dL (ref 0.61–1.24)
GFR calc Af Amer: >60 mL/min (ref >60)
GFR calc non Af Amer: 59 mL/min — ABNORMAL LOW (ref >60)
GLUCOSE: 95 mg/dL (ref 65–99)
Potassium: 3.9 mmol/L (ref 3.5–5.1)
Sodium: 137 mmol/L (ref 135–145)

## 2015-11-13 LAB — GLUCOSE, CAPILLARY
GLUCOSE-CAPILLARY: 90 mg/dL (ref 65–99)
GLUCOSE-CAPILLARY: 112 mg/dL — AB (ref 65–99)

## 2015-11-13 LAB — CBC
HEMATOCRIT: 37.2 % — AB (ref 39.0–52.0)
Hemoglobin: 12.7 g/dL — ABNORMAL LOW (ref 13.0–17.0)
MCH: 28.8 pg (ref 26.0–34.0)
MCHC: 34.1 g/dL (ref 30.0–36.0)
MCV: 84.4 fL (ref 78.0–100.0)
Platelets: 216 10*3/uL (ref 150–400)
RBC: 4.41 MIL/uL (ref 4.22–5.81)
RDW: 14.7 % (ref 11.5–15.5)
WBC: 6.2 10*3/uL (ref 4.0–10.5)

## 2015-11-13 MED ORDER — TICAGRELOR 90 MG PO TABS: 90.0000 mg | ORAL_TABLET | Freq: Two times a day (BID) | ORAL | Status: DC

## 2015-11-13 MED ORDER — LISINOPRIL 10 MG PO TABS: 10.0000 mg | ORAL_TABLET | Freq: Every day | ORAL | Status: DC

## 2015-11-13 MED ORDER — METOPROLOL TARTRATE 25 MG PO TABS: 12.5000 mg | ORAL_TABLET | Freq: Two times a day (BID) | ORAL | Status: DC

## 2015-11-13 MED ORDER — NITROGLYCERIN 0.4 MG SL SUBL: 0.4000 mg | SUBLINGUAL_TABLET | SUBLINGUAL | Status: DC | PRN

## 2015-11-13 MED FILL — Heparin Sodium (Porcine) 2 Unit/ML in Sodium Chloride 0.9%: INTRAMUSCULAR | Qty: 500 | Status: AC

## 2015-11-13 NOTE — Discharge Summary (Signed)
Discharge summary dictated on 11/13/2015, dictation number is [phone]

## 2015-11-13 NOTE — Progress Notes (Signed)
CARDIAC REHAB PHASE I   PRE:  Rate/Rhythm: 74 SR  BP:  Sitting: 148/68        SaO2: 98 RA  MODE:  Ambulation: 500 ft   POST:  Rate/Rhythm: 88 SR  BP:  Sitting: 170/83        SaO2: 98 RA  Pt ambulated 500 ft on RA, handheld assist, steady gait, tolerated well.  Pt denies cp, dizziness, DOE, declined rest stop. Completed PCI, stent education with pt and wife at bedside.  Reviewed risk factors, anti-platelet therapy, stent card, activity restrictions, ntg, exercise, heart healthy diet, and phase 2 cardiac rehab. Pt verbalized understanding, receptive to education, admits to not exercising,states it will be difficult but he will try. Pt agrees to phase 2 cardiac rehab referral, will send to [facility] per pt request. Pt to bed after walk, call bell within reach.   [id number] Lenna Sciara, RN, BSN 11/13/2015 9:12 AM

## 2015-11-13 NOTE — Discharge Instructions (Signed)
Coronary Angiogram With Stent Coronary angiography with stent placement is a procedure to widen or open a narrow blood vessel of the heart (coronary artery). When a coronary artery becomes partially blocked, it decreases blood flow to that area. This may lead to chest pain or a heart attack (myocardial infarction). Arteries may become blocked by cholesterol buildup (plaque) in the lining or wall.  A stent is a small piece of metal that looks like a mesh or a spring. Stent placement may be done right after a coronary angiography in which a blocked artery is found or as a treatment for a heart attack.  LET [facility] CARE PROVIDER KNOW ABOUT:  Any allergies you have.   All medicines you are taking, including vitamins, herbs, eye drops, creams, and over-the-counter medicines.   Previous problems you or members of your family have had with the use of anesthetics.   Any blood disorders you have.   Previous surgeries you have had.   Medical conditions you have. RISKS AND COMPLICATIONS Generally, coronary angiography with stent is a safe procedure. However, problems can occur and include:  Damage to the heart or its blood vessels.   A return of blockage.   Bleeding, infection, or bruising at the insertion site.   A collection of blood under the skin (hematoma) at the insertion site.  Blood clot in another part of the body.   Kidney injury.   Allergic reaction to the dye or contrast used.   Bleeding into the abdomen (retroperitoneal bleeding). BEFORE THE PROCEDURE  Do not eat or drink anything after midnight on the night before the procedure or as directed by your health care provider.  Ask your health care provider about changing or stopping your regular medicines. This is especially important if you are taking diabetes medicines or blood thinners.  Your health care provider will make sure you understand the procedure as well as the risks and potential problems  associated with the procedure.  PROCEDURE  You may be given a medicine to help you relax before and during the procedure (sedative). This medicine will be given through an IV tube that is put into one of your veins.   The area where the catheter will be inserted will be shaved and cleaned. This is usually done in the groin but may be done in the fold of your arm (near your elbow) or in the wrist.   A medicine will be given to numb the area where the catheter will be inserted (local anesthetic).   The catheter will be inserted into an artery using a guide wire. A type of X-ray (fluoroscopy) will be used to help guide the catheter to the opening of the blocked artery.   A dye will then be injected into the catheter, and X-rays will be taken. The dye will help to show where any narrowing or blockages are located in the heart arteries.   A tiny wire will be guided to the blocked spot, and a balloon will be inflated to make the artery wider. The stent will be expanded and will crush the plaque into the wall of the vessel. The stent will hold the area open like a scaffolding and improve the blood flow.   Sometimes the artery may be made wider using a laser or other tools to remove plaque.   When the blood flow is better, the catheter will be removed. The lining of the artery will grow over the stent, which stays where it was placed.  AFTER THE PROCEDURE °· If the procedure is done through the leg, you will be kept in bed lying flat for about 6 hours. You will be instructed to not bend or cross your legs.   °· The insertion site will be checked frequently.   °· The pulse in your feet or wrist will be checked frequently.   °· Additional blood tests, X-rays, and electrocardiography may be done. °  °This information is not intended to replace advice given to you by your health care provider. Make sure you discuss any questions you have with your health care provider. °  °Document Released:  06/04/2003 Document Revised: 12/19/2014 Document Reviewed: 04/22/2013 °Elsevier Interactive Patient Education ©2016 Elsevier [employer] °Acute Coronary Syndrome °Acute coronary syndrome (ACS) is a serious problem in which there is suddenly not enough blood and oxygen supplied to the heart. ACS may mean that one or more of the blood vessels in your heart (coronary arteries) may be blocked. ACS can result in chest pain or a heart attack (myocardial infarction or MI). °CAUSES °This condition is caused by atherosclerosis, which is the buildup of fat and cholesterol (plaque) on the inside of the arteries. Over time, the plaque may narrow or block the artery, and this will lessen blood flow to the heart. Plaque can also become weak and break off within a coronary artery to form a clot and cause a sudden blockage. °RISK FACTORS °The risks factors of this condition include: °· High cholesterol levels. °· High blood pressure (hypertension). °· Smoking. °· Diabetes. °· Age. °· Family history of chest pain, heart disease, or stroke. °· Lack of exercise. °SYMPTOMS °The most common signs of this condition include: °· Chest pain, which can be: °¨ A crushing or squeezing in the chest. °¨ A tightness, pressure, fullness, or heaviness in the chest. °¨ Present for more than a few minutes, or it can stop and recur. °· Pain in the arms, neck, jaw, or back. °· Unexplained heartburn or indigestion. °· Shortness of breath. °· Nausea. °· Sudden cold sweats. °· Feeling light-headed or dizzy. °Sometimes, this condition has no symptoms. °DIAGNOSIS °ACS may be diagnosed through the following tests: °· Electrocardiogram (ECG). °· Blood tests. °· Coronary angiogram. This is a procedure to look at the coronary arteries to see if there is any blockage. °TREATMENT °Treatment for ACS may include: °· Healthy behavioral changes to reduce or control risk factors. °· Medicine. °· Coronary stenting. A stent helps to keep an artery open. °· Coronary  angioplasty. This procedure widens a narrowed or blocked artery. °· Coronary artery bypass surgery. This will allow your blood to pass the blockage (bypass) to reach your heart. °HOME CARE INSTRUCTIONS °Eating and Drinking °· Follow a heart-healthy diet. A dietitian can you help to educate you about healthy food options and changes. °· Use healthy cooking methods such as roasting, grilling, broiling, baking, poaching, steaming, or stir-frying. Talk to a dietitian to learn more about healthy cooking methods. °Medicines °· Take medicines only as directed by your health care provider. °· Do not take the following medicines unless your health care provider approves: °¨ Nonsteroidal anti-inflammatory drugs (NSAIDs), such as ibuprofen, naproxen, or celecoxib. °¨ Vitamin supplements that contain vitamin A, vitamin E, or both. °¨ Hormone replacement therapy that contains estrogen with or without progestin. °· Stop illegal drug use. °Activities °· Follow an exercise program that is approved by your health care provider. °· Plan rest periods when you are fatigued. °Lifestyle °· Do not use any tobacco products, including cigarettes, chewing tobacco,   or electronic cigarettes. If you need help quitting, ask your health care provider. °· If you drink alcohol, and your health care provider approves, limit your alcohol intake to no more than 1 drink per day. One drink equals 12 ounces of beer, 5 ounces of wine, or 1½ ounces of hard liquor. °· Learn to manage stress. °· Maintain a healthy weight. Lose weight as approved by your health care provider. °General Instructions °· Manage other health conditions, such as hypertension and diabetes, as directed by your health care provider. °· Keep all follow-up visits as directed by your health care provider. This is important. °· Your health care provider may ask you to monitor your blood pressure. A blood pressure reading consists of a higher number over a lower number, such as 110 over  72, written as 110/72. Ideally, your blood pressure should be: °¨ Below 140/90 if you have no other medical conditions. °¨ Below 130/80 if you have diabetes or kidney disease. °SEEK IMMEDIATE MEDICAL CARE IF: °· You have pain in your chest, neck, arm, jaw, stomach, or back that lasts more than a few minutes, is recurring, or is not relieved by taking medicine under your tongue (sublingual nitroglycerin). °· You have profuse sweating without cause. °· You have unexplained: °¨ Heartburn or indigestion. °¨ Shortness of breath or difficulty breathing. °¨ Nausea or vomiting. °¨ Fatigue. °¨ Feelings of nervousness or anxiety. °¨ Weakness. °¨ Diarrhea. °· You have sudden light-headedness or dizziness. °· You faint. °These symptoms may represent a serious problem that is an emergency. Do not wait to see if the symptoms will go away. Get medical help right away. Call your local emergency services (911 in the [place] Do not drive yourself to the clinic or hospital. °  °This information is not intended to replace advice given to you by your health care provider. Make sure you discuss any questions you have with your health care provider. °  °Document Released: 11/28/2005 Document Revised: 12/19/2014 Document Reviewed: 04/01/2014 °Elsevier Interactive Patient Education ©2016 Elsevier Inc. ° °

## 2015-11-13 NOTE — Progress Notes (Signed)
CM received consult regarding brilinta. CM spoke with pt and provided pt with brilinta booklet with 30 day free care enclosed. Benefits check in process. CM called [facility]/[address] and confirmed medication is instock, pt made aware. CM will f/u with regarding results of benefits check. Whitman Hero RN,BSN,CM [phone]

## 2015-11-14 NOTE — Discharge Summary (Signed)
NAME:  Francisco Collier, Francisco Collier               ACCOUNT NO.:  [id number]  MEDICAL RECORD NO.:  [id number]  LOCATION:  6C03C                        FACILITY:  [facility]  PHYSICIAN:  Samika Vetsch N. Terrence Dupont, M.D. DATE OF BIRTH:  [birth year]  DATE OF ADMISSION:  11/11/2015 DATE OF DISCHARGE:  11/13/2015                              DISCHARGE SUMMARY   ADMITTING DIAGNOSES: 1. Unstable angina, rule out myocardial infarction. 2. Coronary artery disease, history of anteroseptal wall myocardial     infarction in the past status post PCI to LAD in the past. 3. Hypertension. 4. Diabetes mellitus. 5. Hyperlipidemia. 6. Degenerative joint disease. 7. Remote tobacco abuse. 8. Chronic obstructive pulmonary disease. 9. Gastroesophageal reflux disease. 10.Morbid obesity.  DISCHARGE DIAGNOSES: 1. Status post unstable angina, myocardial infarction ruled out status     post cardiac catheterization/PTCA stenting to RCA with excellent     angiographic results. 2. Coronary artery disease, history of anteroseptal wall myocardial     infarction in the past status post PCI to LAD. 3. Hypertension. 4. Diabetes mellitus. 5. Hyperlipidemia. 6. Degenerative joint disease. 7. Remote tobacco abuse. 8. Chronic obstructive pulmonary disease. 9. Gastroesophageal reflux disease. 10.Morbid obesity.  DISCHARGE HOME MEDICATIONS: 1. Lisinopril 10 mg daily. 2. Metoprolol tartrate 12.5 mg twice daily. 3. Brilinta 90 mg twice daily. 4. Amlodipine 5 mg daily. 5. Aspirin 81 mg 1 tablet daily. 6. Fenofibrate 160 mg daily. 7. Flonase 50 mcg as needed. 8. Nitrostat sublingual p.r.n. 9. The patient has been advised to stop lisinopril,     hydrochlorothiazide, and meloxicam.  DIET:  Low salt, low cholesterol, 1800 calories ADA diet.  Monitor blood pressure daily and chart.  Post-cardiac cath instructions have been given.  The patient will be scheduled for phase 2 cardiac rehab as outpatient.  The patient has been extensively counseled  regarding diet, exercise, compliance with medications, followup, and lifestyle changes.  CONDITION AT DISCHARGE:  Stable.  BRIEF HISTORY AND HOSPITAL COURSE:  Mr. Rexroth is a 74 year old male with past medical history significant for coronary artery disease, history of anteroseptal wall myocardial infarction in 2005 status post PTCA stenting to LAD, hypertension, non-insulin-dependent diabetes mellitus controlled by diet, hyperlipidemia, remote tobacco abuse 40- pack years, quit approximately 17 years ago, GERD, degenerative joint disease.  He came to the ER complaining of retrosternal chest pain described as tightness grade 3 to 4/10 associated with mild shortness of breath.  Also noted to have elevated blood pressure.  The patient also gives history of exertional dyspnea lately.  States activity is limited. Chest pain occasionally gets better with burping, but feels chest pain is similar in nature when he had MI.  Denies any PND, orthopnea, or leg swelling.  Denies palpitation, lightheadedness, or syncope.  Denies any recent cardiac workup.  EKG done in the ED showed normal sinus rhythm with no acute ischemic changes.  First set of cardiac enzymes were negative.  The patient was presently chest pain free when seen in the ED.  PHYSICAL EXAMINATION:  GENERAL:  He was alert, awake, oriented x3. VITAL SIGNS:  Blood pressure when seen in the ED was 120/68, pulse 81. He was afebrile. EYES:  Conjunctivae were pink. NECK:  Supple.  No  JVD.  No bruit. LUNGS:  Clear to auscultation without rhonchi or rales. CARDIOVASCULAR:  S1, S2 normal.  There was soft systolic murmur.  No S3, gallop. ABDOMEN:  Soft.  Bowel sounds present, obese, nontender. EXTREMITIES:  There is no clubbing, cyanosis, or edema.  LABORATORY DATA:  Sodium was 136, potassium 3.9, BUN 20, creatinine 1.35.  Glucose was 107, repeat fasting sugar was 82.  Two sets of troponin-I were negative.  Cholesterol was 119,  triglycerides 97, HDL was low at 22, LDL 78.  Hemoglobin was 14.5, hematocrit 42.5, white count of 7.1.  Repeat electrolytes; BUN was 19, creatinine 1.29 which is trending down.  Today; sodium is 137, potassium 3.9, BUN 12, creatinine 1.19.  Hemoglobin is 12.7, hematocrit 37.2, white count of 6.2 which has been stable.  EKG showed normal sinus rhythm with no acute ischemic changes.  BRIEF HOSPITAL COURSE:  The patient was admitted to telemetry unit.  MI was ruled out by serial enzymes and EKG.  The patient subsequently underwent left cardiac cath and PTCA stenting to RCA as per procedure report.  The patient tolerated the procedure well.  There were no complications.  Postprocedure, the patient did not have any episodes of chest pain during the hospital stay.  His groin is stable with no evidence of hematoma or bruit.  Phase 1 cardiac rehab was called.  The patient has been ambulating in room and hallway without any problems. The patient will be discharged home on above medications and will be followed up in my office in 1 week.  The patient will be scheduled for phase 2 cardiac rehab as outpatient.  Total time spent in discharging the patient was approximately 45 minutes.     Allegra Lai. Terrence Dupont, M.D.     MNH/MEDQ  D:  11/13/2015  T:  11/14/2015  Job:  [id number]

## 2016-11-27 ENCOUNTER — Emergency Department (HOSPITAL_COMMUNITY)
Admission: EM | Admit: 2016-11-27 | Discharge: 2016-11-28 | Disposition: A | Discharge status: Home / Self Care | Payer: Medicare HMO | Attending: Emergency Medicine | Admitting: Emergency Medicine

## 2016-11-27 ENCOUNTER — Encounter (HOSPITAL_COMMUNITY): Payer: Self-pay | Admitting: Emergency Medicine

## 2016-11-27 DIAGNOSIS — I10 Essential (primary) hypertension: Secondary | ICD-10-CM | POA: Insufficient documentation

## 2016-11-27 DIAGNOSIS — R42 Dizziness and giddiness: Secondary | ICD-10-CM | POA: Diagnosis present

## 2016-11-27 DIAGNOSIS — J449 Chronic obstructive pulmonary disease, unspecified: Secondary | ICD-10-CM | POA: Insufficient documentation

## 2016-11-27 DIAGNOSIS — Z79899 Other long term (current) drug therapy: Secondary | ICD-10-CM | POA: Insufficient documentation

## 2016-11-27 DIAGNOSIS — I2510 Atherosclerotic heart disease of native coronary artery without angina pectoris: Secondary | ICD-10-CM | POA: Diagnosis not present

## 2016-11-27 DIAGNOSIS — Z7982 Long term (current) use of aspirin: Secondary | ICD-10-CM | POA: Insufficient documentation

## 2016-11-27 DIAGNOSIS — Z955 Presence of coronary angioplasty implant and graft: Secondary | ICD-10-CM | POA: Diagnosis not present

## 2016-11-27 DIAGNOSIS — I252 Old myocardial infarction: Secondary | ICD-10-CM | POA: Insufficient documentation

## 2016-11-27 DIAGNOSIS — Z87891 Personal history of nicotine dependence: Secondary | ICD-10-CM | POA: Insufficient documentation

## 2016-11-27 MED ORDER — MECLIZINE HCL 25 MG PO TABS
25.0000 mg | ORAL_TABLET | Freq: Once | ORAL | Status: AC
  Administered 2016-11-28: 25 mg via ORAL
  Filled 2016-11-27: qty 1

## 2016-11-27 NOTE — ED Notes (Signed)
EDP at bedside  

## 2016-11-27 NOTE — ED Triage Notes (Signed)
Pt arrives by [facility] from home for dizziness. Pt stated that he became dizzy when laying on his side, diaphoretic and felt like he was going to pass out. He got up to get something to drink and felt the dizziness again. The dizziness feels worse with positional changes. EMS gave 4 mg of Zofran IV and 500 mL bolus. Vitals are stable

## 2016-11-27 NOTE — ED Provider Notes (Signed)
[facility] DEPT Provider Note   CSN: [id number] Arrival date & time: 11/27/16  2259 By signing my name below, I, Dyke Brackett, attest that this documentation has been prepared under the direction and in the presence of Ripley Fraise, MD . Electronically Signed: Dyke Brackett, Scribe. 11/27/2016. 11:34 PM.   History   Chief Complaint Chief Complaint  Patient presents with  . Dizziness    HPI Francisco Collier is a 75 y.o. male who presents to the Emergency Department complaining of sudden onset dizziness which began tonight at 8:45. Pt states he was laying in bed when he felt as if he was going to faint. He describes his dizziness as room-spinning. Pt was feeling fine before symptoms began. He notes associated lightheadedness. No alleviating or modifying factors noted.  No new medications; no hx of stroke. He denies syncope, vomiting, headache, loss of vision, ear pian, tinnitus, CP, SOB, or any weakness to his extremities.  The history is provided by the patient. No language interpreter was used.  Dizziness  Quality:  Room spinning and lightheadedness Severity:  Moderate Onset quality:  Sudden Chronicity:  New Context: inactivity   Context: not with loss of consciousness   Relieved by:  Nothing Associated symptoms: no chest pain, no headaches, no hearing loss, no nausea, no shortness of breath, no syncope, no tinnitus, no vision changes, no vomiting and no weakness   Risk factors: hx of vertigo   Risk factors: no new medications     Past Medical History:  Diagnosis Date  . Arthritis    back  . CAD (coronary artery disease)   . COPD (chronic obstructive pulmonary disease) ([place])   . Diverticulitis   . Esophageal stricture   . Gastric ulcer   . GERD (gastroesophageal reflux disease)   . Hyperlipemia   . Hypertension   . Kidney stone   . MI (myocardial infarction) 2005  . Prostatic hypertrophy   . S/P orchiectomy   . Shortness of breath     Patient Active Problem  List   Diagnosis Date Noted  . Unstable angina ([place]) 11/11/2015  . Snoring 02/13/2015  . Excessive daytime sleepiness 02/13/2015  . Morning headache 02/13/2015  . Frequent nocturnal awakening 02/13/2015  . Nocturnal headaches 02/13/2015  . 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 Obesity 02/13/2015  . Diverticulosis 07/29/2011  . 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 CAD 09/30/2009    Past Surgical History:  Procedure Laterality Date  . ANGIOPLASTY  2005   Stent  . APPENDECTOMY    . BACK SURGERY    . CARDIAC CATHETERIZATION N/A 11/12/2015   Procedure: Left Heart Cath and Coronary Angiography;  Surgeon: Charolette Forward, MD;  Location: [facility];  Service: Cardiovascular;  Laterality: N/A;  . CHOLECYSTECTOMY    . CHOLECYSTECTOMY    . CORONARY ANGIOPLASTY    . THYROID SURGERY         Home Medications    Prior to Admission medications   Medication Sig Start Date End Date Taking? Authorizing Provider  amLODipine (NORVASC) 5 MG tablet Take 5 mg by mouth daily.    Historical Provider, MD  aspirin 81 MG tablet Take 81 mg by mouth every morning.     Historical Provider, MD  fenofibrate 160 MG tablet Take 160 mg by mouth daily.    Historical Provider, MD  fluticasone (FLONASE) 50 MCG/ACT nasal spray Place 1 spray into both nostrils 2 (two) times daily as needed  for allergies or rhinitis.     Historical Provider, MD  lisinopril (PRINIVIL) 10 MG tablet Take 1 tablet (10 mg total) by mouth daily. 11/13/15   Charolette Forward, MD  metoprolol tartrate (LOPRESSOR) 25 MG tablet Take 0.5 tablets (12.5 mg total) by mouth 2 (two) times daily. 11/13/15   Charolette Forward, MD  nitroGLYCERIN (NITROSTAT) 0.4 MG SL tablet Place 1 tablet (0.4 mg total) under the tongue every 5 (five) minutes as needed for chest pain. 11/13/15   Charolette Forward, MD  ticagrelor (BRILINTA) 90 MG TABS tablet Take 1 tablet (90 mg total) by mouth 2 (two) times daily. 11/13/15    Charolette Forward, MD    Family History Family History  Problem Relation Age of Onset  . Colon cancer Neg Hx   . Migraines Neg Hx   . Lung cancer Father     Social History Social History  Substance Use Topics  . Smoking status: Former Smoker    Years: 40.00    Quit date: 12/12/1998  . Smokeless tobacco: Never Used  . Alcohol use No     Allergies   Codeine; Niaspan [niacin er]; and Statins   Review of Systems Review of Systems  HENT: Negative for ear pain, hearing loss and tinnitus.   Eyes: Negative for visual disturbance.  Respiratory: Negative for shortness of breath.   Cardiovascular: Negative for chest pain and syncope.  Gastrointestinal: Negative for nausea and vomiting.  Neurological: Positive for dizziness and light-headedness. Negative for syncope, weakness and headaches.  All other systems reviewed and are negative.   Physical Exam Updated Vital Signs BP 122/76 (BP Location: Right Arm)   Pulse 93   Temp 99.3 F (37.4 C) (Oral)   Resp 20   SpO2 96%   Physical Exam  Nursing note and vitals reviewed. CONSTITUTIONAL: Well developed/well nourished HEAD: Normocephalic/atraumatic EYES: EOMI/PERRL, no nystagmus, no ptosis ENMT: Mucous membranes moist, bilateral TMs intact NECK: supple no meningeal signs, no bruits CV: S1/S2 noted, no murmurs/rubs/gallops noted LUNGS: Lungs are clear to auscultation bilaterally, no apparent distress ABDOMEN: soft, nontender, no rebound or guarding GU:no cva tenderness NEURO:Awake/alert, face symmetric, no arm or leg drift is noted Equal 5/5 strength with shoulder abduction, elbow flex/extension, wrist flex/extension in upper extremities and equal hand grips bilaterally Equal 5/5 strength with hip flexion,knee flex/extension, foot dorsi/plantar flexion Cranial nerves 3/4/5/6/06/19/09/11/12 tested and intact No past pointing Sensation to light touch intact in all extremities EXTREMITIES: pulses normal, full ROM SKIN: warm, color  normal PSYCH: no abnormalities of mood noted    ED Treatments / Results  DIAGNOSTIC STUDIES:  Oxygen Saturation is 96% on RA, normal by my interpretation.    COORDINATION OF CARE:  11:30 PM Will order urinalysis, CBC, and BMP. Discussed treatment plan with pt at bedside and pt agreed to plan.  Labs (all labs ordered are listed, but only abnormal results are displayed) Labs Reviewed  BASIC METABOLIC PANEL - Abnormal; Notable for the following:       Result Value   Glucose, Bld 116 (*)    All other components within normal limits  URINALYSIS, ROUTINE W REFLEX MICROSCOPIC - Abnormal; Notable for the following:    Color, Urine STRAW (*)    Specific Gravity, Urine 1.002 (*)    All other components within normal limits  CBC WITH DIFFERENTIAL/PLATELET    EKG  EKG Interpretation  Date/Time:  Monday November 28 2016 00:07:47 EST Ventricular Rate:  81 PR Interval:  182 QRS Duration: 82 QT Interval:  352  QTC Calculation: 409 R Axis:   25 Text Interpretation:  Sinus rhythm Low voltage, extremity leads Confirmed by Christy Gentles  MD, Jasmina Gendron ([id number]) on 11/28/2016 12:22:11 AM       Radiology No results found.  Procedures Procedures (including critical care time)  Medications Ordered in ED Medications  meclizine (ANTIVERT) tablet 25 mg (25 mg Oral Given 11/28/16 0004)     Initial Impression / Assessment and Plan / ED Course  I have reviewed the triage vital signs and the nursing notes.  Pertinent labs results that were available during my care of the patient were reviewed by me and considered in my medical decision making (see chart for details).  Clinical Course    2:09 AM Pt improved  He is well appearing He is now ambulatory and feels at baseline I have low suspicion for acute stroke Will start antivert We discussed strict ER return precautions   Final Clinical Impressions(s) / ED Diagnoses   Final diagnoses:  Vertigo    New Prescriptions New Prescriptions    MECLIZINE (ANTIVERT) 25 MG TABLET    Take 1 tablet (25 mg total) by mouth 2 (two) times daily as needed for dizziness.   I personally performed the services described in this documentation, which was scribed in my presence. The recorded information has been reviewed and is accurate.        Ripley Fraise, MD 11/28/16 [phone]

## 2016-11-28 LAB — CBC WITH DIFFERENTIAL/PLATELET
BASOS ABS: 0 10*3/uL (ref 0.0–0.1)
Basophils Relative: 0 %
EOS ABS: 0.1 10*3/uL (ref 0.0–0.7)
EOS PCT: 1 %
HCT: 40.8 % (ref 39.0–52.0)
Hemoglobin: 13.9 g/dL (ref 13.0–17.0)
LYMPHS PCT: 19 %
Lymphs Abs: 1.7 10*3/uL (ref 0.7–4.0)
MCH: 29.2 pg (ref 26.0–34.0)
MCHC: 34.1 g/dL (ref 30.0–36.0)
MCV: 85.7 fL (ref 78.0–100.0)
MONO ABS: 0.9 10*3/uL (ref 0.1–1.0)
Monocytes Relative: 10 %
Neutro Abs: 6.2 10*3/uL (ref 1.7–7.7)
Neutrophils Relative %: 70 %
PLATELETS: 306 10*3/uL (ref 150–400)
RBC: 4.76 MIL/uL (ref 4.22–5.81)
RDW: 13.8 % (ref 11.5–15.5)
WBC: 8.9 10*3/uL (ref 4.0–10.5)

## 2016-11-28 LAB — BASIC METABOLIC PANEL
Anion gap: 7 (ref 5–15)
BUN: 12 mg/dL (ref 6–20)
CALCIUM: 9.1 mg/dL (ref 8.9–10.3)
CO2: 26 mmol/L (ref 22–32)
CREATININE: 1.04 mg/dL (ref 0.61–1.24)
Chloride: 106 mmol/L (ref 101–111)
GFR calc Af Amer: >60 mL/min (ref >60)
GLUCOSE: 116 mg/dL — AB (ref 65–99)
POTASSIUM: 3.8 mmol/L (ref 3.5–5.1)
SODIUM: 139 mmol/L (ref 135–145)

## 2016-11-28 LAB — URINALYSIS, ROUTINE W REFLEX MICROSCOPIC
Bilirubin Urine: NEGATIVE
GLUCOSE, UA: NEGATIVE mg/dL
HGB URINE DIPSTICK: NEGATIVE
KETONES UR: NEGATIVE mg/dL
LEUKOCYTES UA: NEGATIVE
Nitrite: NEGATIVE
PROTEIN: NEGATIVE mg/dL
Specific Gravity, Urine: 1.002 — ABNORMAL LOW (ref 1.005–1.030)
pH: 7 (ref 5.0–8.0)

## 2016-11-28 MED ORDER — MECLIZINE HCL 25 MG PO TABS: 25.0000 mg | ORAL_TABLET | Freq: Two times a day (BID) | ORAL | 0 refills | Status: DC | PRN

## 2016-11-28 NOTE — ED Notes (Signed)
Pt ambulated to bathroom and back unassisted with a steady gait, pt states he feels better and is ready to go home.

## 2016-11-28 NOTE — Discharge Instructions (Signed)

## 2017-01-16 ENCOUNTER — Other Ambulatory Visit: Payer: Self-pay | Admitting: Cardiology

## 2017-01-16 DIAGNOSIS — R079 Chest pain, unspecified: Secondary | ICD-10-CM

## 2017-01-27 ENCOUNTER — Ambulatory Visit (HOSPITAL_COMMUNITY)
Admission: RE | Admit: 2017-01-27 | Discharge: 2017-01-27 | Disposition: A | Discharge status: Home / Self Care | Payer: Medicare HMO | Source: Ambulatory Visit | Attending: Cardiology | Admitting: Cardiology

## 2017-01-27 DIAGNOSIS — R079 Chest pain, unspecified: Secondary | ICD-10-CM | POA: Diagnosis not present

## 2017-01-27 DIAGNOSIS — R931 Abnormal findings on diagnostic imaging of heart and coronary circulation: Secondary | ICD-10-CM | POA: Diagnosis not present

## 2017-01-27 MED ORDER — REGADENOSON 0.4 MG/5ML IV SOLN
INTRAVENOUS | Status: AC
  Filled 2017-01-27: qty 5

## 2017-01-27 MED ORDER — TECHNETIUM TC 99M TETROFOSMIN IV KIT
10.0000 | PACK | Freq: Once | INTRAVENOUS | Status: AC | PRN
  Administered 2017-01-27: 10 via INTRAVENOUS

## 2017-01-27 MED ORDER — REGADENOSON 0.4 MG/5ML IV SOLN
0.4000 mg | Freq: Once | INTRAVENOUS | Status: AC
  Administered 2017-01-27: 0.4 mg via INTRAVENOUS

## 2017-01-27 MED ORDER — TECHNETIUM TC 99M TETROFOSMIN IV KIT
30.0000 | PACK | Freq: Once | INTRAVENOUS | Status: AC | PRN
  Administered 2017-01-27: 30 via INTRAVENOUS

## 2017-12-12 ENCOUNTER — Emergency Department (HOSPITAL_COMMUNITY)
Admission: EM | Admit: 2017-12-12 | Discharge: 2017-12-12 | Disposition: A | Discharge status: Home / Self Care | Payer: Medicare HMO | Attending: Physician Assistant | Admitting: Physician Assistant

## 2017-12-12 ENCOUNTER — Encounter (HOSPITAL_COMMUNITY): Payer: Self-pay | Admitting: Emergency Medicine

## 2017-12-12 ENCOUNTER — Emergency Department (HOSPITAL_COMMUNITY): Payer: Medicare HMO

## 2017-12-12 DIAGNOSIS — Z7982 Long term (current) use of aspirin: Secondary | ICD-10-CM | POA: Insufficient documentation

## 2017-12-12 DIAGNOSIS — J449 Chronic obstructive pulmonary disease, unspecified: Secondary | ICD-10-CM | POA: Diagnosis not present

## 2017-12-12 DIAGNOSIS — Z79899 Other long term (current) drug therapy: Secondary | ICD-10-CM | POA: Insufficient documentation

## 2017-12-12 DIAGNOSIS — M5442 Lumbago with sciatica, left side: Secondary | ICD-10-CM | POA: Diagnosis not present

## 2017-12-12 DIAGNOSIS — M25552 Pain in left hip: Secondary | ICD-10-CM | POA: Diagnosis present

## 2017-12-12 DIAGNOSIS — Z87891 Personal history of nicotine dependence: Secondary | ICD-10-CM | POA: Diagnosis not present

## 2017-12-12 DIAGNOSIS — I10 Essential (primary) hypertension: Secondary | ICD-10-CM | POA: Diagnosis not present

## 2017-12-12 DIAGNOSIS — I25110 Atherosclerotic heart disease of native coronary artery with unstable angina pectoris: Secondary | ICD-10-CM | POA: Diagnosis not present

## 2017-12-12 DIAGNOSIS — Z7902 Long term (current) use of antithrombotics/antiplatelets: Secondary | ICD-10-CM | POA: Insufficient documentation

## 2017-12-12 MED ORDER — METHOCARBAMOL 500 MG PO TABS: 500.0000 mg | ORAL_TABLET | Freq: Two times a day (BID) | ORAL | 0 refills | Status: DC

## 2017-12-12 MED ORDER — METHOCARBAMOL 500 MG PO TABS
500.0000 mg | ORAL_TABLET | Freq: Once | ORAL | Status: AC
Start: 2017-12-12 — End: 2017-12-12
  Administered 2017-12-12: 500 mg via ORAL
  Filled 2017-12-12: qty 1

## 2017-12-12 MED ORDER — HYDROCODONE-ACETAMINOPHEN 5-325 MG PO TABS: 1.0000 | ORAL_TABLET | ORAL | 0 refills | Status: DC | PRN

## 2017-12-12 MED ORDER — LIDOCAINE 5 % EX PTCH: 1.0000 | MEDICATED_PATCH | CUTANEOUS | 0 refills | Status: DC

## 2017-12-12 MED ORDER — HYDROCODONE-ACETAMINOPHEN 5-325 MG PO TABS
1.0000 | ORAL_TABLET | Freq: Once | ORAL | Status: AC
  Administered 2017-12-12: 1 via ORAL
  Filled 2017-12-12: qty 1

## 2017-12-12 NOTE — ED Notes (Signed)
EDPA Provider at bedside. 

## 2017-12-12 NOTE — ED Triage Notes (Signed)
Pt c/o left hip pain that is severe when moving around since Friday. Denies any recent falls or injuries that could cause the pain.

## 2017-12-12 NOTE — ED Provider Notes (Signed)
[facility] DEPT Provider Note   CSN: [id number] Arrival date & time: 12/12/17  4196     History   Chief Complaint Chief Complaint  Patient presents with  . Hip Pain    HPI Francisco Collier is a 77 y.o. male.  HPI   Francisco Collier is a 77 y.o. male, with a history of GERD, MI, and COPD, presenting to the ED with left lower back pain beginning Dec 28. States he was in the kitchen when he began to feel pain in this area. Pain is shooting, 5/10 at rest, 9/10 with walking, radiating into the left buttock. Very little pain with standing/weight bearing. Pain with walking seems to come on when he extends his left leg to take a step, rather than when he places weight on it.  Has had similar pain in the past and is followed by Dr. Louanne Skye, orthopedist. Denies numbness/tingling, weakness, falls/trauma, changes in bowel or bladder function, groin pain, dysuria/hematuria, saddle anesthesias, N/V, abdominal pain, or any other complaints.      Past Medical History:  Diagnosis Date  . Arthritis    back  . CAD (coronary artery disease)   . COPD (chronic obstructive pulmonary disease) ([place])   . Diverticulitis   . Esophageal stricture   . Gastric ulcer   . GERD (gastroesophageal reflux disease)   . Hyperlipemia   . Hypertension   . Kidney stone   . MI (myocardial infarction) ([place]) 2005  . Prostatic hypertrophy   . S/P orchiectomy   . Shortness of breath     Patient Active Problem List   Diagnosis Date Noted  . Unstable angina ([place]) 11/11/2015  . Snoring 02/13/2015  . Excessive daytime sleepiness 02/13/2015  . Morning headache 02/13/2015  . Frequent nocturnal awakening 02/13/2015  . Nocturnal headaches 02/13/2015  . 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 Obesity 02/13/2015  . Diverticulosis 07/29/2011  . 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 CAD 09/30/2009    Past Surgical History:  Procedure  Laterality Date  . ANGIOPLASTY  2005   Stent  . APPENDECTOMY    . BACK SURGERY    . CARDIAC CATHETERIZATION N/A 11/12/2015   Procedure: Left Heart Cath and Coronary Angiography;  Surgeon: Charolette Forward, MD;  Location: [facility];  Service: Cardiovascular;  Laterality: N/A;  . CHOLECYSTECTOMY    . CHOLECYSTECTOMY    . CORONARY ANGIOPLASTY    . THYROID SURGERY         Home Medications    Prior to Admission medications   Medication Sig Start Date End Date Taking? Authorizing Provider  amLODipine (NORVASC) 5 MG tablet Take 5 mg by mouth daily.   Yes [provider]  aspirin 81 MG tablet Take 81 mg by mouth every morning.    Yes [provider]  clopidogrel (PLAVIX) 75 MG tablet Take 75 mg by mouth daily.   Yes [provider]  fenofibrate 160 MG tablet Take 160 mg by mouth daily.   Yes [provider]  fluticasone (FLONASE) 50 MCG/ACT nasal spray Place 1 spray into both nostrils 2 (two) times daily as needed for allergies or rhinitis.    Yes [provider]  lisinopril (PRINIVIL) 10 MG tablet Take 1 tablet (10 mg total) by mouth daily. 11/13/15  Yes Charolette Forward, MD  meclizine (ANTIVERT) 25 MG tablet Take 1 tablet (25 mg total) by mouth 2 (two) times daily as needed for  dizziness. 11/28/16  Yes Ripley Fraise, MD  metoprolol tartrate (LOPRESSOR) 25 MG tablet Take 0.5 tablets (12.5 mg total) by mouth 2 (two) times daily. 11/13/15  Yes Charolette Forward, MD  nitroGLYCERIN (NITROSTAT) 0.4 MG SL tablet Place 1 tablet (0.4 mg total) under the tongue every 5 (five) minutes as needed for chest pain. 11/13/15  Yes Charolette Forward, MD  HYDROcodone-acetaminophen (NORCO/VICODIN) 5-325 MG tablet Take 1-2 tablets by mouth every 4 (four) hours as needed. 12/12/17   Dakarri Kessinger C, PA-C  lidocaine (LIDODERM) 5 % Place 1 patch onto the skin daily. Remove & Discard patch within 12 hours or as directed by MD 12/12/17   Lorriann Hansmann C, PA-C  methocarbamol (ROBAXIN)  500 MG tablet Take 1 tablet (500 mg total) by mouth 2 (two) times daily. 12/12/17   Donnarae Rae, Helane Gunther, PA-C  ticagrelor (BRILINTA) 90 MG TABS tablet Take 1 tablet (90 mg total) by mouth 2 (two) times daily. Patient not taking: Reported on 12/12/2017 11/13/15   Charolette Forward, MD    Family History Family History  Problem Relation Age of Onset  . Lung cancer Father   . Colon cancer Neg Hx   . Migraines Neg Hx     Social History Social History   Tobacco Use  . Smoking status: Former Smoker    Years: 40.00    Last attempt to quit: 12/12/1998    Years since quitting: 19.0  . Smokeless tobacco: Never Used  Substance Use Topics  . Alcohol use: No  . Drug use: No     Allergies   Codeine; Niaspan [niacin er]; and Statins   Review of Systems Review of Systems  Cardiovascular: Negative for leg swelling.  Gastrointestinal: Negative for abdominal pain, nausea and vomiting.  Genitourinary: Negative for dysuria and hematuria.  Musculoskeletal: Positive for back pain.  Neurological: Negative for weakness and numbness.     Physical Exam Updated Vital Signs BP 116/74   Pulse 75   Temp 97.8 F (36.6 C) (Oral)   Resp 18   SpO2 97%   Physical Exam  Constitutional: He appears well-developed and well-nourished. No distress.  HENT:  Head: Normocephalic and atraumatic.  Eyes: Conjunctivae are normal.  Neck: Neck supple.  Cardiovascular: Normal rate, regular rhythm and intact distal pulses.  Pulmonary/Chest: Effort normal. No respiratory distress.  Abdominal: Soft. There is no tenderness. There is no guarding.  Musculoskeletal: He exhibits tenderness. He exhibits no edema.  Tenderness to the left lower lumbar musculature into the left buttocks.  No tenderness to the left lateral or anterior hip. Pain elicited in the area in question with left straight leg raise.  Full passive range of motion in the left hip and knee otherwise without pain. Patient has no difficulty with weightbearing.  His  pain is elicited when he steps forward with his left leg.  Lymphadenopathy:    He has no cervical adenopathy.  Neurological: He is alert.  No noted acute sensory deficits. Strength 5/5 with flexion and extension at the bilateral hips, knees, and ankles.   Skin: Skin is warm and dry. Capillary refill takes less than 2 seconds. He is not diaphoretic.  Psychiatric: He has a normal mood and affect. His behavior is normal.  Nursing note and vitals reviewed.    ED Treatments / Results  Labs (all labs ordered are listed, but only abnormal results are displayed) Labs Reviewed - No data to display  EKG  EKG Interpretation None       Radiology Dg Hip  Unilat With Pelvis 2-3 Views Left  Result Date: 12/12/2017 CLINICAL DATA:  Fall 1 month ago.  Left hip pain EXAM: DG HIP (WITH OR WITHOUT PELVIS) 2-3V LEFT COMPARISON:  04/16/2015 FINDINGS: Negative for fracture. Mild degenerative change in both hip joints. Multilevel lumbar laminectomy. IMPRESSION: Negative for fracture Electronically Signed   By: Franchot Gallo M.D.   On: 12/12/2017 11:17    Procedures Procedures (including critical care time)  Medications Ordered in ED Medications  HYDROcodone-acetaminophen (NORCO/VICODIN) 5-325 MG per tablet 1 tablet (1 tablet Oral Given 12/12/17 1610)  methocarbamol (ROBAXIN) tablet 500 mg (500 mg Oral Given 12/12/17 1610)     Initial Impression / Assessment and Plan / ED Course  I have reviewed the triage vital signs and the nursing notes.  Pertinent labs & imaging results that were available during my care of the patient were reviewed by me and considered in my medical decision making (see chart for details).  Clinical Course as of Dec 12 1649  Tue Dec 12, 2017  1638 Patient's pain has resolved. He can now move much more freely. States he is ready for discharge.   [SJ]    Clinical Course User Index [SJ] Zayne Draheim C, PA-C    Patient presents with left lower back pain.  With some moderate  pain management, patient was able to have relief in his pain and ambulate independently.  Patient has avenues for PCP and orthopedic follow-up. The patient was given instructions for home care as well as return precautions. Patient voices understanding of these instructions, accepts the plan, and is comfortable with discharge.  Findings and plan of care discussed with Zenovia Jarred, MD. Dr. Thomasene Lot personally evaluated and examined this patient.  Final Clinical Impressions(s) / ED Diagnoses   Final diagnoses:  Acute left-sided low back pain with left-sided sciatica    ED Discharge Orders        Ordered    HYDROcodone-acetaminophen (NORCO/VICODIN) 5-325 MG tablet  Every 4 hours PRN     12/12/17 1644    methocarbamol (ROBAXIN) 500 MG tablet  2 times daily     12/12/17 1644    lidocaine (LIDODERM) 5 %  Every 24 hours     12/12/17 1644       Lorayne Bender, PA-C 12/12/17 1651    Macarthur Critchley, MD 12/12/17 2329

## 2017-12-12 NOTE — Discharge Instructions (Signed)
Take it easy, but do not lay around too much as this may make any stiffness worse.  Tylenol: May use the Tylenol, as needed, for pain.  Your daily total maximum amount of tylenol from all sources should be limited to 4000mg /day for persons without liver problems, or 2000mg /day for those with liver problems. Vicodin: May take Vicodin as needed for severe pain.  Do not drive or perform other dangerous activities while taking the Vicodin.  Please note that each pill of Vicodin contains 325 mg of Tylenol and the above dosage limits apply. Muscle relaxer: Robaxin is a muscle relaxer and may help loosen stiff muscles. Do not take the Robaxin while driving or performing other dangerous activities.  Lidocaine patches: These are available via either prescription or over-the-counter. The over-the-counter option may be more economical one and are likely just as effective. There are multiple over-the-counter brands, such as Salonpas. Exercises: Be sure to perform the attached exercises starting with three times a week and working up to performing them daily. This is an essential part of preventing long term problems.   Follow up with a primary care provider and/or your orthopedic specialist for any future management of these complaints.

## 2018-04-06 ENCOUNTER — Encounter (HOSPITAL_COMMUNITY): Payer: Self-pay | Admitting: Emergency Medicine

## 2018-04-06 ENCOUNTER — Emergency Department (HOSPITAL_COMMUNITY)
Admission: EM | Admit: 2018-04-06 | Discharge: 2018-04-06 | Disposition: A | Discharge status: Home / Self Care | Payer: Medicare HMO | Attending: Emergency Medicine | Admitting: Emergency Medicine

## 2018-04-06 DIAGNOSIS — Z79899 Other long term (current) drug therapy: Secondary | ICD-10-CM | POA: Diagnosis not present

## 2018-04-06 DIAGNOSIS — Z87891 Personal history of nicotine dependence: Secondary | ICD-10-CM | POA: Insufficient documentation

## 2018-04-06 DIAGNOSIS — I2510 Atherosclerotic heart disease of native coronary artery without angina pectoris: Secondary | ICD-10-CM | POA: Insufficient documentation

## 2018-04-06 DIAGNOSIS — Z7982 Long term (current) use of aspirin: Secondary | ICD-10-CM | POA: Insufficient documentation

## 2018-04-06 DIAGNOSIS — J449 Chronic obstructive pulmonary disease, unspecified: Secondary | ICD-10-CM | POA: Insufficient documentation

## 2018-04-06 DIAGNOSIS — I252 Old myocardial infarction: Secondary | ICD-10-CM | POA: Diagnosis not present

## 2018-04-06 DIAGNOSIS — Z7901 Long term (current) use of anticoagulants: Secondary | ICD-10-CM | POA: Insufficient documentation

## 2018-04-06 DIAGNOSIS — G8929 Other chronic pain: Secondary | ICD-10-CM

## 2018-04-06 DIAGNOSIS — I10 Essential (primary) hypertension: Secondary | ICD-10-CM | POA: Insufficient documentation

## 2018-04-06 DIAGNOSIS — M545 Low back pain, unspecified: Secondary | ICD-10-CM

## 2018-04-06 MED ORDER — METHOCARBAMOL 1000 MG/10ML IJ SOLN
1000.0000 mg | Freq: Once | INTRAVENOUS | Status: AC
  Administered 2018-04-06: 1000 mg via INTRAVENOUS
  Filled 2018-04-06: qty 10

## 2018-04-06 MED ORDER — KETOROLAC TROMETHAMINE 30 MG/ML IJ SOLN
15.0000 mg | Freq: Once | INTRAMUSCULAR | Status: AC
  Administered 2018-04-06: 15 mg via INTRAVENOUS
  Filled 2018-04-06: qty 1

## 2018-04-06 MED ORDER — METHOCARBAMOL 500 MG PO TABS: 500.0000 mg | ORAL_TABLET | Freq: Four times a day (QID) | ORAL | 0 refills | Status: DC | PRN

## 2018-04-06 MED ORDER — HYDROMORPHONE HCL 1 MG/ML IJ SOLN
0.5000 mg | Freq: Once | INTRAMUSCULAR | Status: AC
  Administered 2018-04-06: 0.5 mg via INTRAVENOUS
  Filled 2018-04-06: qty 1

## 2018-04-06 MED ORDER — ONDANSETRON HCL 4 MG/2ML IJ SOLN
4.0000 mg | Freq: Once | INTRAMUSCULAR | Status: AC
  Administered 2018-04-06: 4 mg via INTRAVENOUS
  Filled 2018-04-06: qty 2

## 2018-04-06 NOTE — ED Provider Notes (Signed)
[facility] DEPT Provider Note   CSN: [id number] Arrival date & time: 04/06/18  0253     History   Chief Complaint Chief Complaint  Patient presents with  . Back Pain    HPI Francisco Collier is a 77 y.o. male.  The history is provided by the patient.  He has history of coronary artery disease, COPD, diverticulitis, colon cancer, hypertension, hyperlipidemia and comes in with worsening low back pain.  He has a history of 2 back surgeries and has chronic back pain.  He had seen his PCP and given a prescription for oxycodone-acetaminophen.  He took 2 doses tonight without relief.  He states that this evening, he had sudden onset of severe pain across his lower back into both hips.  There is no radiation down his legs no weakness or numbness or tingling.  He denies bowel or bladder dysfunction.  Pain was rated at 10/10.  Is worse with any movement.  Nothing made it better.  He denies any recent trauma or unusual activity.  Past Medical History:  Diagnosis Date  . Arthritis    back  . CAD (coronary artery disease)   . COPD (chronic obstructive pulmonary disease) ([place])   . Diverticulitis   . Esophageal stricture   . Gastric ulcer   . GERD (gastroesophageal reflux disease)   . Hyperlipemia   . Hypertension   . Kidney stone   . MI (myocardial infarction) ([place]) 2005  . Prostatic hypertrophy   . S/P orchiectomy   . Shortness of breath     Patient Active Problem List   Diagnosis Date Noted  . Unstable angina ([place]) 11/11/2015  . Snoring 02/13/2015  . Excessive daytime sleepiness 02/13/2015  . Morning headache 02/13/2015  . Frequent nocturnal awakening 02/13/2015  . Nocturnal headaches 02/13/2015  . 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 Obesity 02/13/2015  . Diverticulosis 07/29/2011  . 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 CAD 09/30/2009    Past Surgical History:  Procedure Laterality Date    . ANGIOPLASTY  2005   Stent  . APPENDECTOMY    . BACK SURGERY    . CARDIAC CATHETERIZATION N/A 11/12/2015   Procedure: Left Heart Cath and Coronary Angiography;  Surgeon: Charolette Forward, MD;  Location: [facility];  Service: Cardiovascular;  Laterality: N/A;  . CHOLECYSTECTOMY    . CHOLECYSTECTOMY    . CORONARY ANGIOPLASTY    . THYROID SURGERY          Home Medications    Prior to Admission medications   Medication Sig Start Date End Date Taking? Authorizing Provider  amLODipine (NORVASC) 5 MG tablet Take 5 mg by mouth daily.    [provider]  aspirin 81 MG tablet Take 81 mg by mouth every morning.     [provider]  clopidogrel (PLAVIX) 75 MG tablet Take 75 mg by mouth daily.    [provider]  fenofibrate 160 MG tablet Take 160 mg by mouth daily.    [provider]  fluticasone (FLONASE) 50 MCG/ACT nasal spray Place 1 spray into both nostrils 2 (two) times daily as needed for allergies or rhinitis.     [provider]  HYDROcodone-acetaminophen (NORCO/VICODIN) 5-325 MG tablet Take 1-2 tablets by mouth every 4 (four) hours as needed. 12/12/17   Joy, Shawn C, PA-C  lidocaine (LIDODERM) 5 % Place 1 patch onto the skin daily. Remove & Discard patch within 12  hours or as directed by MD 12/12/17   Joy, Shawn C, PA-C  lisinopril (PRINIVIL) 10 MG tablet Take 1 tablet (10 mg total) by mouth daily. 11/13/15   Charolette Forward, MD  meclizine (ANTIVERT) 25 MG tablet Take 1 tablet (25 mg total) by mouth 2 (two) times daily as needed for dizziness. 11/28/16   Ripley Fraise, MD  methocarbamol (ROBAXIN) 500 MG tablet Take 1 tablet (500 mg total) by mouth 2 (two) times daily. 12/12/17   Joy, Shawn C, PA-C  metoprolol tartrate (LOPRESSOR) 25 MG tablet Take 0.5 tablets (12.5 mg total) by mouth 2 (two) times daily. 11/13/15   Charolette Forward, MD  nitroGLYCERIN (NITROSTAT) 0.4 MG SL tablet Place 1 tablet (0.4 mg total) under the tongue every 5 (five)  minutes as needed for chest pain. 11/13/15   Charolette Forward, MD  ticagrelor (BRILINTA) 90 MG TABS tablet Take 1 tablet (90 mg total) by mouth 2 (two) times daily. Patient not taking: Reported on 12/12/2017 11/13/15   Charolette Forward, MD    Family History Family History  Problem Relation Age of Onset  . Lung cancer Father   . Colon cancer Neg Hx   . Migraines Neg Hx     Social History Social History   Tobacco Use  . Smoking status: Former Smoker    Years: 40.00    Last attempt to quit: 12/12/1998    Years since quitting: 19.3  . Smokeless tobacco: Never Used  Substance Use Topics  . Alcohol use: No  . Drug use: No     Allergies   Codeine; Niaspan [niacin er]; and Statins   Review of Systems Review of Systems  All other systems reviewed and are negative.    Physical Exam Updated Vital Signs BP 129/76 (BP Location: Right Arm)   Pulse 84   Temp 97.7 F (36.5 C) (Oral)   Resp 18   SpO2 93%   Physical Exam  Nursing note and vitals reviewed.  77 year old male, resting comfortably and in no acute distress. Vital signs are normal. Oxygen saturation is 93%, which is normal. Head is normocephalic and atraumatic. PERRLA, EOMI. Oropharynx is clear. Neck is nontender and supple without adenopathy or JVD. Back is moderately tender in the lower lumbar area.  There is moderate bilateral paralumbar spasm.  Straight leg raise is positive bilaterally at 45 degrees, but significantly more painful on the left.  There is no CVA tenderness. Lungs are clear without rales, wheezes, or rhonchi. Chest is nontender. Heart has regular rate and rhythm without murmur. Abdomen is soft, flat, nontender without masses or hepatosplenomegaly and peristalsis is normoactive. Extremities have no cyanosis or edema, full range of motion is present. Skin is warm and dry without rash. Neurologic: Mental status is normal, cranial nerves are intact, there are no motor or sensory deficits.  ED Treatments /  Results  Labs (all labs ordered are listed, but only abnormal results are displayed) Labs Reviewed - No data to display  EKG None  Radiology No results found.  Procedures Procedures   Medications Ordered in ED Medications - No data to display   Initial Impression / Assessment and Plan / ED Course  I have reviewed the triage vital signs and the nursing notes.  Exacerbation of chronic back pain.  No red flags to suggest more serious pathology.  Old records are reviewed, and he has a prior ED visit for back pain approximately 4 months ago.  His record on the [place] controlled substance  reporting website was reviewed, and he had a prescription for 60 oxycodone-acetaminophen tablets filled on April 18.  Prior to that, last narcotic prescription was hydrocodone-acetaminophen 20 tablets on January 1.  He will be given IV ketorolac, hydromorphone, ondansetron, and methocarbamol.  Of note, triage note states he is on a blood thinner.  His "blood thinner" is clopidogrel, and not an actual anticoagulant.  He had considerable relief with above-noted treatment, but still states he has significant pain with movement.  He is given a second dose of hydromorphone.  He is discharged with instructions to continue taking his oxycodone-acetaminophen and is given prescription for methocarbamol.  Advised to supplement with over-the-counter NSAIDs such as naproxen or ibuprofen.  Advised to apply ice to his lower back.  Given back exercises to try to prevent future episodes.  Follow-up with PCP.  Final Clinical Impressions(s) / ED Diagnoses   Final diagnoses:  Acute exacerbation of chronic low back pain    ED Discharge Orders        Ordered    methocarbamol (ROBAXIN) 500 MG tablet  Every 6 hours PRN     01/77/93 9030       Delora Fuel, MD 09/03/29 [phone]

## 2018-04-06 NOTE — Discharge Instructions (Signed)
Apply ice several times a day.  Take ibuprofen or naproxen as needed for additional pain relief.

## 2018-04-06 NOTE — ED Triage Notes (Signed)
Pt comes to ed, via ems, c/o of chronic back pain, pain started 3pm yesterday.  Hx of back surgery.  325 mg tab x2 oxycodone., not working, v/s on 125/85, pluses 89, rr20, spo2 91, pain 10 out 10.  Pt is on a a blood thinner.

## 2018-05-10 ENCOUNTER — Other Ambulatory Visit: Payer: Self-pay | Admitting: Family Medicine

## 2018-05-10 ENCOUNTER — Ambulatory Visit
Admission: RE | Admit: 2018-05-10 | Discharge: 2018-05-10 | Disposition: A | Discharge status: Home / Self Care | Payer: Medicare HMO | Source: Ambulatory Visit | Attending: Family Medicine | Admitting: Family Medicine

## 2018-05-10 DIAGNOSIS — R05 Cough: Secondary | ICD-10-CM

## 2018-05-10 DIAGNOSIS — R058 Other specified cough: Secondary | ICD-10-CM

## 2018-10-02 ENCOUNTER — Ambulatory Visit (INDEPENDENT_AMBULATORY_CARE_PROVIDER_SITE_OTHER): Payer: Medicare HMO

## 2018-10-02 ENCOUNTER — Ambulatory Visit (HOSPITAL_COMMUNITY)
Admission: EM | Admit: 2018-10-02 | Discharge: 2018-10-02 | Disposition: A | Discharge status: Home / Self Care | Payer: Medicare HMO | Attending: Family Medicine | Admitting: Family Medicine

## 2018-10-02 ENCOUNTER — Other Ambulatory Visit: Payer: Self-pay

## 2018-10-02 ENCOUNTER — Encounter (HOSPITAL_COMMUNITY): Payer: Self-pay | Admitting: *Deleted

## 2018-10-02 DIAGNOSIS — M25461 Effusion, right knee: Secondary | ICD-10-CM

## 2018-10-02 DIAGNOSIS — M25561 Pain in right knee: Secondary | ICD-10-CM

## 2018-10-02 DIAGNOSIS — M1711 Unilateral primary osteoarthritis, right knee: Secondary | ICD-10-CM

## 2018-10-02 MED ORDER — NAPROXEN 375 MG PO TABS: 375.0000 mg | ORAL_TABLET | Freq: Two times a day (BID) | ORAL | 0 refills | Status: DC

## 2018-10-02 NOTE — ED Triage Notes (Signed)
States he stepped down off his tractor last Thurs and twisted his right knee, states the pain is getting worse every day.

## 2018-10-02 NOTE — Discharge Instructions (Signed)
Knee brace given.  Wear as needed for comfort.   Continue conservative management of rest, ice, elevation, and gentle stretches Take naproxen as needed for pain relief (may cause abdominal discomfort, ulcers, and GI bleeds avoid taking with other NSAIDs) Follow up with orthopedist for further evaluation and management Return or go to the ER if you have any new or worsening symptoms (fever, chills, chest pain, abdominal pain, changes in bowel or bladder habits, pain radiating into lower legs, etc...)

## 2018-10-02 NOTE — ED Provider Notes (Signed)
Francisco Collier   [id number] 10/02/18 Arrival Time: 7858  CC: right knee pain  SUBJECTIVE: History from: patient. Francisco Collier is a 77 y.o. male complains of right knee pain that began 5 days ago.  Symptoms began after he stepped down off his tractor and twisted his knee.  Localizes the pain to the medial aspect.  Describes the pain as intermittent and "15"/10 with walking, but 1/10 at rest.  Has tried OTC medications without relief.  Symptoms are made worse with walking and weight-bearing.  Reports similar symptoms in the past.  Complains of associated swelling. Denies fever, chills, erythema, ecchymosis, weakness, numbness and tingling.      ROS: As per HPI.  Past Medical History:  Diagnosis Date  . Arthritis    back  . CAD (coronary artery disease)   . COPD (chronic obstructive pulmonary disease) ([place])   . Diverticulitis   . Esophageal stricture   . Gastric ulcer   . GERD (gastroesophageal reflux disease)   . Hyperlipemia   . Hypertension   . Kidney stone   . MI (myocardial infarction) ([place]) 2005  . Prostatic hypertrophy   . S/P orchiectomy   . Shortness of breath    Past Surgical History:  Procedure Laterality Date  . ANGIOPLASTY  2005   Stent  . APPENDECTOMY    . BACK SURGERY    . CARDIAC CATHETERIZATION N/A 11/12/2015   Procedure: Left Heart Cath and Coronary Angiography;  Surgeon: Charolette Forward, MD;  Location: [facility];  Service: Cardiovascular;  Laterality: N/A;  . CHOLECYSTECTOMY    . CHOLECYSTECTOMY    . CORONARY ANGIOPLASTY    . THYROID SURGERY     Allergies  Allergen Reactions  . Codeine Nausea Only    Pill form causes nausea Iv form does not  . Niaspan [Niacin Er] Other (See Comments)    Severe flushing  . Statins Rash    Headaches    No current facility-administered medications on file prior to encounter.    Current Outpatient Medications on File Prior to Encounter  Medication Sig Dispense Refill  . amLODipine (NORVASC) 5 MG  tablet Take 5 mg by mouth daily.    Marland Kitchen aspirin 81 MG tablet Take 81 mg by mouth every morning.     . clopidogrel (PLAVIX) 75 MG tablet Take 75 mg by mouth daily.    . fenofibrate 160 MG tablet Take 160 mg by mouth daily.    . fluticasone (FLONASE) 50 MCG/ACT nasal spray Place 1 spray into both nostrils 2 (two) times daily as needed for allergies or rhinitis.     Marland Kitchen HYDROcodone-acetaminophen (NORCO/VICODIN) 5-325 MG tablet Take 1-2 tablets by mouth every 4 (four) hours as needed. 20 tablet 0  . lidocaine (LIDODERM) 5 % Place 1 patch onto the skin daily. Remove & Discard patch within 12 hours or as directed by MD 30 patch 0  . lisinopril (PRINIVIL) 10 MG tablet Take 1 tablet (10 mg total) by mouth daily. 30 tablet 3  . meclizine (ANTIVERT) 25 MG tablet Take 1 tablet (25 mg total) by mouth 2 (two) times daily as needed for dizziness. 15 tablet 0  . methocarbamol (ROBAXIN) 500 MG tablet Take 1 tablet (500 mg total) by mouth every 6 (six) hours as needed for muscle spasms. 40 tablet 0  . metoprolol tartrate (LOPRESSOR) 25 MG tablet Take 0.5 tablets (12.5 mg total) by mouth 2 (two) times daily. 60 tablet 3  . nitroGLYCERIN (NITROSTAT) 0.4 MG SL tablet  Place 1 tablet (0.4 mg total) under the tongue every 5 (five) minutes as needed for chest pain. 25 tablet 12   Social History   Socioeconomic History  . Marital status: Married    Spouse name: Francisco Collier  . Number of children: 0  . Years of education: 20  . Highest education level: Not on file  Occupational History  . Occupation: Retired  Scientific laboratory technician  . Financial resource strain: Not on file  . Food insecurity:    Worry: Not on file    Inability: Not on file  . Transportation needs:    Medical: Not on file    Non-medical: Not on file  Tobacco Use  . Smoking status: Former Smoker    Years: 40.00    Last attempt to quit: 12/12/1998    Years since quitting: 19.8  . Smokeless tobacco: Never Used  Substance and Sexual Activity  . Alcohol use: No    . Drug use: No  . Sexual activity: Not on file  Lifestyle  . Physical activity:    Days per week: Not on file    Minutes per session: Not on file  . Stress: Not on file  Relationships  . Social connections:    Talks on phone: Not on file    Gets together: Not on file    Attends religious service: Not on file    Active member of club or organization: Not on file    Attends meetings of clubs or organizations: Not on file    Relationship status: Not on file  . Intimate partner violence:    Fear of current or ex partner: Not on file    Emotionally abused: Not on file    Physically abused: Not on file    Forced sexual activity: Not on file  Other Topics Concern  . Not on file  Social History Narrative   Lives at home with wife.   Caffeine: none   Family History  Problem Relation Age of Onset  . Lung cancer Father   . Colon cancer Neg Hx   . Migraines Neg Hx     OBJECTIVE:  Vitals:   10/02/18 1139  BP: 109/62  Pulse: 72  Resp: 18  Temp: 97.7 F (36.5 C)  TempSrc: Oral  SpO2: 98%    General appearance: AOx3; in no acute distress.  Head: NCAT Lungs: CTA bilaterally Heart: RRR.  Clear S1 and S2 without murmur, gallops, or rubs.  Radial pulses 2+ bilaterally. Musculoskeletal: Left knee (exam limited due to patient sitting in chair) Inspection: Skin intact with mild effusion Palpation: Diffusely tender about the medial aspect of the knee, and MJL; discomfort with valgus stress ROM: LROM  Strength: 4+/5 hip flexion, 5/5 knee abduction, 5/5 knee adduction, 5/5 knee flexion, 5/5 knee extension Skin: warm and dry Neurologic: Antalgic gait, uses cane; Sensation intact about the lower extremities Psychological: alert and cooperative; normal mood and affect  DIAGNOSTIC STUDIES:  Dg Knee Complete 4 Views Right  Result Date: 10/02/2018 CLINICAL DATA:  Per pt: stepping down off the tractor this past Thursday, somehow injured the right knee. Pain is the medial right knee at  the mid joint, medial patella. Patient is unable to stand, arrived by wheelchair. EXAM: RIGHT KNEE - COMPLETE 4+ VIEW COMPARISON:  None. FINDINGS: Tricompartmental spurring particularly in the patellofemoral joint. Small patellofemoral joint effusion in the suprapatellar bursa. Spurring of the tibial spine noted. Mild medial compartmental articular space narrowing. Distal quadriceps spurring. Slight laxity/irregularity of the patellar  tendon on the lateral projection. Distal SFA atherosclerotic calcification. IMPRESSION: 1. Moderate osteoarthritis. 2. Small knee effusion. 3. Slightly wavy contour of the patellar tendon raising the possibility of laxity, although there no patella alta. 4. Atherosclerosis. 5. Given the patient's inability to bear weight, MRI may be helpful for further characterization. Electronically Signed   By: Van Clines M.D.   On: 10/02/2018 12:27     ASSESSMENT & PLAN:  1. Acute pain of right knee   2. Primary osteoarthritis of right knee    No orders of the defined types were placed in this encounter.  Knee brace given.  Wear as needed for comfort.   Continue conservative management of rest, ice, elevation, and gentle stretches Take naproxen as needed for pain relief (may cause abdominal discomfort, ulcers, and GI bleeds avoid taking with other NSAIDs) Follow up with orthopedist for further evaluation and management Return or go to the ER if you have any new or worsening symptoms (fever, chills, chest pain, abdominal pain, changes in bowel or bladder habits, pain radiating into lower legs, etc...)   Reviewed expectations re: course of current medical issues. Questions answered. Outlined signs and symptoms indicating need for more acute intervention. Patient verbalized understanding. After Visit Summary given.    Lestine Box, PA-C 10/02/18 1333

## 2020-10-06 ENCOUNTER — Other Ambulatory Visit: Payer: Self-pay

## 2020-10-06 ENCOUNTER — Emergency Department (HOSPITAL_COMMUNITY)
Admission: EM | Admit: 2020-10-06 | Discharge: 2020-10-06 | Disposition: A | Discharge status: Home / Self Care | Payer: Medicare HMO | Attending: Internal Medicine | Admitting: Internal Medicine

## 2020-10-06 ENCOUNTER — Encounter (HOSPITAL_COMMUNITY)
Admission: EM | Disposition: A | Discharge status: Home / Self Care | Payer: Self-pay | Source: Home / Self Care | Attending: Emergency Medicine

## 2020-10-06 ENCOUNTER — Encounter (HOSPITAL_COMMUNITY): Payer: Self-pay

## 2020-10-06 DIAGNOSIS — E785 Hyperlipidemia, unspecified: Secondary | ICD-10-CM | POA: Insufficient documentation

## 2020-10-06 DIAGNOSIS — T18128A Food in esophagus causing other injury, initial encounter: Secondary | ICD-10-CM | POA: Insufficient documentation

## 2020-10-06 DIAGNOSIS — Z87891 Personal history of nicotine dependence: Secondary | ICD-10-CM | POA: Insufficient documentation

## 2020-10-06 DIAGNOSIS — Z87442 Personal history of urinary calculi: Secondary | ICD-10-CM | POA: Insufficient documentation

## 2020-10-06 DIAGNOSIS — Z791 Long term (current) use of non-steroidal anti-inflammatories (NSAID): Secondary | ICD-10-CM | POA: Diagnosis not present

## 2020-10-06 DIAGNOSIS — K219 Gastro-esophageal reflux disease without esophagitis: Secondary | ICD-10-CM | POA: Insufficient documentation

## 2020-10-06 DIAGNOSIS — Z9049 Acquired absence of other specified parts of digestive tract: Secondary | ICD-10-CM | POA: Diagnosis not present

## 2020-10-06 DIAGNOSIS — K317 Polyp of stomach and duodenum: Secondary | ICD-10-CM | POA: Diagnosis not present

## 2020-10-06 DIAGNOSIS — M1990 Unspecified osteoarthritis, unspecified site: Secondary | ICD-10-CM | POA: Insufficient documentation

## 2020-10-06 DIAGNOSIS — K222 Esophageal obstruction: Secondary | ICD-10-CM | POA: Insufficient documentation

## 2020-10-06 DIAGNOSIS — J449 Chronic obstructive pulmonary disease, unspecified: Secondary | ICD-10-CM | POA: Diagnosis not present

## 2020-10-06 DIAGNOSIS — I252 Old myocardial infarction: Secondary | ICD-10-CM | POA: Insufficient documentation

## 2020-10-06 DIAGNOSIS — Z8711 Personal history of peptic ulcer disease: Secondary | ICD-10-CM | POA: Diagnosis not present

## 2020-10-06 DIAGNOSIS — Z7902 Long term (current) use of antithrombotics/antiplatelets: Secondary | ICD-10-CM | POA: Insufficient documentation

## 2020-10-06 DIAGNOSIS — Z9079 Acquired absence of other genital organ(s): Secondary | ICD-10-CM | POA: Insufficient documentation

## 2020-10-06 DIAGNOSIS — K5790 Diverticulosis of intestine, part unspecified, without perforation or abscess without bleeding: Secondary | ICD-10-CM | POA: Diagnosis not present

## 2020-10-06 DIAGNOSIS — I25110 Atherosclerotic heart disease of native coronary artery with unstable angina pectoris: Secondary | ICD-10-CM | POA: Insufficient documentation

## 2020-10-06 DIAGNOSIS — Z801 Family history of malignant neoplasm of trachea, bronchus and lung: Secondary | ICD-10-CM | POA: Diagnosis not present

## 2020-10-06 DIAGNOSIS — Z85038 Personal history of other malignant neoplasm of large intestine: Secondary | ICD-10-CM | POA: Diagnosis not present

## 2020-10-06 DIAGNOSIS — Z885 Allergy status to narcotic agent status: Secondary | ICD-10-CM | POA: Diagnosis not present

## 2020-10-06 DIAGNOSIS — Z20822 Contact with and (suspected) exposure to covid-19: Secondary | ICD-10-CM | POA: Insufficient documentation

## 2020-10-06 DIAGNOSIS — E669 Obesity, unspecified: Secondary | ICD-10-CM | POA: Diagnosis not present

## 2020-10-06 DIAGNOSIS — N400 Enlarged prostate without lower urinary tract symptoms: Secondary | ICD-10-CM | POA: Diagnosis not present

## 2020-10-06 DIAGNOSIS — X58XXXA Exposure to other specified factors, initial encounter: Secondary | ICD-10-CM | POA: Insufficient documentation

## 2020-10-06 DIAGNOSIS — Z888 Allergy status to other drugs, medicaments and biological substances status: Secondary | ICD-10-CM | POA: Diagnosis not present

## 2020-10-06 HISTORY — PX: FOREIGN BODY REMOVAL: SHX962

## 2020-10-06 HISTORY — PX: ESOPHAGOGASTRODUODENOSCOPY: SHX5428

## 2020-10-06 LAB — CBC WITH DIFFERENTIAL/PLATELET
Abs Immature Granulocytes: 0.04 10*3/uL (ref 0.00–0.07)
Basophils Absolute: 0 10*3/uL (ref 0.0–0.1)
Basophils Relative: 0 %
Eosinophils Absolute: 0.1 10*3/uL (ref 0.0–0.5)
Eosinophils Relative: 1 %
HCT: 48.4 % (ref 39.0–52.0)
Hemoglobin: 16 g/dL (ref 13.0–17.0)
Immature Granulocytes: 0 %
Lymphocytes Relative: 15 %
Lymphs Abs: 1.7 10*3/uL (ref 0.7–4.0)
MCH: 29.5 pg (ref 26.0–34.0)
MCHC: 33.1 g/dL (ref 30.0–36.0)
MCV: 89.1 fL (ref 80.0–100.0)
Monocytes Absolute: 0.8 10*3/uL (ref 0.1–1.0)
Monocytes Relative: 8 %
Neutro Abs: 8.3 10*3/uL — ABNORMAL HIGH (ref 1.7–7.7)
Neutrophils Relative %: 76 %
Platelets: 305 10*3/uL (ref 150–400)
RBC: 5.43 MIL/uL (ref 4.22–5.81)
RDW: 15.1 % (ref 11.5–15.5)
WBC: 11 10*3/uL — ABNORMAL HIGH (ref 4.0–10.5)
nRBC: 0 % (ref 0.0–0.2)

## 2020-10-06 LAB — BASIC METABOLIC PANEL
Anion gap: 8 (ref 5–15)
BUN: 14 mg/dL (ref 8–23)
CO2: 29 mmol/L (ref 22–32)
Calcium: 8.9 mg/dL (ref 8.9–10.3)
Chloride: 99 mmol/L (ref 98–111)
Creatinine, Ser: 1.1 mg/dL (ref 0.61–1.24)
GFR, Estimated: >60 mL/min (ref >60)
Glucose, Bld: 115 mg/dL — ABNORMAL HIGH (ref 70–99)
Potassium: 4.1 mmol/L (ref 3.5–5.1)
Sodium: 136 mmol/L (ref 135–145)

## 2020-10-06 LAB — RESPIRATORY PANEL BY RT PCR (FLU A&B, COVID)
Influenza A by PCR: NEGATIVE
Influenza B by PCR: NEGATIVE
SARS Coronavirus 2 by RT PCR: NEGATIVE

## 2020-10-06 SURGERY — EGD (ESOPHAGOGASTRODUODENOSCOPY)
Anesthesia: Moderate Sedation

## 2020-10-06 MED ORDER — DIPHENHYDRAMINE HCL 50 MG/ML IJ SOLN
INTRAMUSCULAR | Status: AC
  Filled 2020-10-06: qty 1

## 2020-10-06 MED ORDER — PANTOPRAZOLE SODIUM 20 MG PO TBEC: 20.0000 mg | DELAYED_RELEASE_TABLET | Freq: Every day | ORAL | 0 refills | Status: DC

## 2020-10-06 MED ORDER — GLUCAGON HCL RDNA (DIAGNOSTIC) 1 MG IJ SOLR
1.0000 mg | Freq: Once | INTRAMUSCULAR | Status: AC
  Administered 2020-10-06: 1 mg via INTRAVENOUS
  Filled 2020-10-06: qty 1

## 2020-10-06 MED ORDER — FENTANYL CITRATE (PF) 100 MCG/2ML IJ SOLN
INTRAMUSCULAR | Status: DC | PRN
  Administered 2020-10-06 (×3): 25 ug via INTRAVENOUS

## 2020-10-06 MED ORDER — FENTANYL CITRATE (PF) 100 MCG/2ML IJ SOLN
INTRAMUSCULAR | Status: AC
  Filled 2020-10-06: qty 4

## 2020-10-06 MED ORDER — MIDAZOLAM HCL (PF) 10 MG/2ML IJ SOLN
INTRAMUSCULAR | Status: DC | PRN
  Administered 2020-10-06: 2 mg via INTRAVENOUS
  Administered 2020-10-06 (×2): 1 mg via INTRAVENOUS

## 2020-10-06 MED ORDER — MIDAZOLAM HCL (PF) 5 MG/ML IJ SOLN
INTRAMUSCULAR | Status: AC
  Filled 2020-10-06: qty 3

## 2020-10-06 NOTE — ED Notes (Signed)
EGD   Francisco Pence, MD 10/06/20 2253

## 2020-10-06 NOTE — ED Notes (Signed)
Patient states that while eating grits and tomato sauce, he began coughing, gagging, and vomiting.  Currently the patient is having intermittent "burping spells."

## 2020-10-06 NOTE — Op Note (Signed)
[facility] Patient Name: Francisco Collier Procedure Date: 10/06/2020 MRN: [id number] Attending MD: Jerene Bears , MD Date of Birth: [birth year] CSN: [id number] Age: 79 Admit Type: Emergency Department Procedure:                Upper GI endoscopy Indications:              Foreign body in the esophagus Providers:                Lajuan Lines. Hilarie Fredrickson, MD, Vista Lawman, RN, Tyna Jaksch                            Technician Referring MD:             Isla Pence, MD Medicines:                Fentanyl 75 micrograms IV, Midazolam 4 mg IV Complications:            No immediate complications. Estimated Blood Loss:     Estimated blood loss: none. Procedure:                Pre-Anesthesia Assessment:                           - Prior to the procedure, a History and Physical                            was performed, and patient medications and                            allergies were reviewed. The patient's tolerance of                            previous anesthesia was also reviewed. The risks                            and benefits of the procedure and the sedation                            options and risks were discussed with the patient.                            All questions were answered, and informed consent                            was obtained. Prior Anticoagulants: The patient has                            taken Plavix (clopidogrel), last dose was day of                            procedure. ASA Grade Assessment: III - A patient                            with severe systemic disease. After reviewing the  risks and benefits, the patient was deemed in                            satisfactory condition to undergo the procedure.                           After obtaining informed consent, the endoscope was                            passed under direct vision. Throughout the                            procedure, the patient's blood pressure, pulse,  and                            oxygen saturations were monitored continuously. The                            GIF-H190 ([id number] Olympus gastroscope was                            introduced through the mouth, and advanced to the                            second part of duodenum. The upper GI endoscopy was                            accomplished without difficulty. The patient                            tolerated the procedure well. Scope In: Scope Out: Findings:      Food was found in the lower third of the esophagus. The scope was able       to be carefully advanced around the food bolus and passed into the       stomach. Once this was accomplished time was taken to carefully advanced       the entirety of the esophageal food bolus into the proximal stomach.      One benign-appearing, intrinsic moderate (circumferential scarring or       stenosis; an endoscope may pass) stenosis was found 38 cm from the       incisors. This stenosis measured 1.1 cm (inner diameter) x less than one       cm (in length). The stenosis was traversed.      A few diminutive sessile polyps were found in the gastric body. These       are benign appearing and have the typical appearance of fundic gland       polyps.      The exam of the stomach was otherwise normal, though the cardia/fundus       was obscured by food/fluid and limited evaluation of the mucosa in this       segment.      The examined duodenum was normal. Impression:               - Food in the lower third of the esophagus. Removed  completely.                           - Benign-appearing esophageal stenosis. Dilation                            not performed tonight due to uninterrupted Plavix                            use.                           - A few benign gastric polyps.                           - Normal examined duodenum.                           - No specimens collected. Moderate Sedation:       Moderate (conscious) sedation was administered by the endoscopy nurse       and supervised by the endoscopist. The following parameters were       monitored: oxygen saturation, heart rate, blood pressure, and response       to care. Total physician intraservice time was 18 minutes. Recommendation:           - Patient has a contact number available for                            emergencies. The signs and symptoms of potential                            delayed complications were discussed with the                            patient. Return to normal activities tomorrow.                            Written discharge instructions were provided to the                            patient.                           - Soft diet.                           - Continue present medications including Nexium 40                            mg daily.                           - Repeat upper endoscopy for dilation to be                            discussed when he comes for office visit as  schedule with Nicoletta Ba, PA-C at [facility] on                            11/11/2020. Repeat surveillance colonoscopy to be                            discussed at follow-up appointment as well. We will                            need to discuss holding Plavix for future                            endoscopic procedures. Procedure Code(s):        --- Professional ---                           [id number], Esophagogastroduodenoscopy, flexible,                            transoral; diagnostic, including collection of                            specimen(s) by brushing or washing, when performed                            (separate procedure)                           G0500, Moderate sedation services provided by the                            same physician or other qualified health care                            professional performing a gastrointestinal                            endoscopic service  that sedation supports,                            requiring the presence of an independent trained                            observer to assist in the monitoring of the                            patient's level of consciousness and physiological                            status; initial 15 minutes of intra-service time;                            patient age 59 years or older (additional time 78  be reported with [id number], as appropriate) Diagnosis Code(s):        --- Professional ---                           [phone], Food in esophagus causing other injury,                            initial encounter                           K22.2, Esophageal obstruction                           K31.7, Polyp of stomach and duodenum                           T18.108A, Unspecified foreign body in esophagus                            causing other injury, initial encounter CPT copyright 2019 American Medical Association. All rights reserved. The codes documented in this report are preliminary and upon coder review may  be revised to meet current compliance requirements. Jerene Bears, MD 10/06/2020 10:58:23 PM This report has been signed electronically. Number of Addenda: 0

## 2020-10-06 NOTE — ED Notes (Signed)
Patient has been moved to [facility] for EGD.  Placed in a gown and on the cardiac monitor with automatic BP and pulse ox.

## 2020-10-06 NOTE — ED Provider Notes (Signed)
[facility] DEPT Provider Note   CSN: [id number] Arrival date & time: 10/06/20  1837     History Chief Complaint  Patient presents with  . Food stuck in throat    Francisco Collier is a 79 y.o. male.  Pt presents to the ED today with food stuck in throat.  Pt has a hx of food impactions in the past.  He has had to have his esophagus stretched several times by [facility].  Pt said he was eating grits around 1600 and they became stuck.  He has been unable to swallow anything since.        Past Medical History:  Diagnosis Date  . Arthritis    back  . CAD (coronary artery disease)   . COPD (chronic obstructive pulmonary disease) ([place])   . Diverticulitis   . Esophageal stricture   . Gastric ulcer   . GERD (gastroesophageal reflux disease)   . Hyperlipemia   . Hypertension   . Kidney stone   . MI (myocardial infarction) ([place]) 2005  . Prostatic hypertrophy   . S/P orchiectomy   . Shortness of breath     Patient Active Problem List   Diagnosis Date Noted  . Food impaction of esophagus   . Esophageal stricture   . Unstable angina ([place]) 11/11/2015  . Snoring 02/13/2015  . Excessive daytime sleepiness 02/13/2015  . Morning headache 02/13/2015  . Frequent nocturnal awakening 02/13/2015  . Nocturnal headaches 02/13/2015  . 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 Obesity 02/13/2015  . Diverticulosis 07/29/2011  . 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 CAD 09/30/2009    Past Surgical History:  Procedure Laterality Date  . ANGIOPLASTY  2005   Stent  . APPENDECTOMY    . BACK SURGERY    . CARDIAC CATHETERIZATION N/A 11/12/2015   Procedure: Left Heart Cath and Coronary Angiography;  Surgeon: Charolette Forward, MD;  Location: [facility];  Service: Cardiovascular;  Laterality: N/A;  . CHOLECYSTECTOMY    . CHOLECYSTECTOMY    . CORONARY ANGIOPLASTY    . THYROID SURGERY         Family  History  Problem Relation Age of Onset  . Lung cancer Father   . Colon cancer Neg Hx   . Migraines Neg Hx     Social History   Tobacco Use  . Smoking status: Former Smoker    Years: 40.00    Quit date: 12/12/1998    Years since quitting: 21.8  . Smokeless tobacco: Never Used  Vaping Use  . Vaping Use: Never used  Substance Use Topics  . Alcohol use: No  . Drug use: No    Home Medications Prior to Admission medications   Medication Sig Start Date End Date Taking? Authorizing Provider  amLODipine (NORVASC) 5 MG tablet Take 5 mg by mouth daily.    [provider]  aspirin 81 MG tablet Take 81 mg by mouth every morning.     [provider]  clopidogrel (PLAVIX) 75 MG tablet Take 75 mg by mouth daily.    [provider]  fenofibrate 160 MG tablet Take 160 mg by mouth daily.    [provider]  fluticasone (FLONASE) 50 MCG/ACT nasal spray Place 1 spray into both nostrils 2 (two) times daily as needed for allergies or rhinitis.     [provider]  HYDROcodone-acetaminophen (NORCO/VICODIN) 5-325 MG tablet Take 1-2 tablets by mouth every  4 (four) hours as needed. 12/12/17   Joy, Shawn C, PA-C  lidocaine (LIDODERM) 5 % Place 1 patch onto the skin daily. Remove & Discard patch within 12 hours or as directed by MD 12/12/17   Joy, Shawn C, PA-C  lisinopril (PRINIVIL) 10 MG tablet Take 1 tablet (10 mg total) by mouth daily. 11/13/15   Charolette Forward, MD  meclizine (ANTIVERT) 25 MG tablet Take 1 tablet (25 mg total) by mouth 2 (two) times daily as needed for dizziness. 11/28/16   Ripley Fraise, MD  methocarbamol (ROBAXIN) 500 MG tablet Take 1 tablet (500 mg total) by mouth every 6 (six) hours as needed for muscle spasms. 1/77/93   Delora Fuel, MD  metoprolol tartrate (LOPRESSOR) 25 MG tablet Take 0.5 tablets (12.5 mg total) by mouth 2 (two) times daily. 11/13/15   Charolette Forward, MD  naproxen (NAPROSYN) 375 MG tablet Take 1 tablet (375 mg total) by mouth  2 (two) times daily. 10/02/18   Wurst, [place], PA-C  nitroGLYCERIN (NITROSTAT) 0.4 MG SL tablet Place 1 tablet (0.4 mg total) under the tongue every 5 (five) minutes as needed for chest pain. 11/13/15   Charolette Forward, MD  pantoprazole (PROTONIX) 20 MG tablet Take 1 tablet (20 mg total) by mouth daily. 10/06/20   Isla Pence, MD    Allergies    Codeine, Niaspan [niacin er], and Statins  Review of Systems   Review of Systems  Gastrointestinal:       Food bolus  All other systems reviewed and are negative.   Physical Exam Updated Vital Signs BP 121/64 (BP Location: Right Arm)   Pulse 90   Temp 97.9 F (36.6 C) (Oral)   Resp (!) 24   SpO2 90%   Physical Exam Vitals and nursing note reviewed.  Constitutional:      Appearance: Normal appearance.  HENT:     Head: Normocephalic and atraumatic.     Right Ear: External ear normal.     Left Ear: External ear normal.     Nose: Nose normal.     Mouth/Throat:     Mouth: Mucous membranes are moist.     Pharynx: Oropharynx is clear.  Eyes:     Extraocular Movements: Extraocular movements intact.     Conjunctiva/sclera: Conjunctivae normal.     Pupils: Pupils are equal, round, and reactive to light.  Cardiovascular:     Rate and Rhythm: Normal rate and regular rhythm.     Pulses: Normal pulses.     Heart sounds: Normal heart sounds.  Pulmonary:     Effort: Pulmonary effort is normal.     Breath sounds: Normal breath sounds.  Abdominal:     General: Abdomen is flat. Bowel sounds are normal.     Palpations: Abdomen is soft.  Musculoskeletal:        General: Normal range of motion.     Cervical back: Normal range of motion and neck supple.  Skin:    General: Skin is warm.     Capillary Refill: Capillary refill takes less than 2 seconds.  Neurological:     General: No focal deficit present.     Mental Status: He is alert and oriented to person, place, and time.  Psychiatric:        Mood and Affect: Mood normal.         Behavior: Behavior normal.     ED Results / Procedures / Treatments   Labs (all labs ordered are listed, but only abnormal results are  displayed) Labs Reviewed  CBC WITH DIFFERENTIAL/PLATELET - Abnormal; Notable for the following components:      Result Value   WBC 11.0 (*)    Neutro Abs 8.3 (*)    All other components within normal limits  BASIC METABOLIC PANEL - Abnormal; Notable for the following components:   Glucose, Bld 115 (*)    All other components within normal limits  RESPIRATORY PANEL BY RT PCR (FLU A&B, COVID)    EKG None  Radiology No results found.  Procedures Procedures (including critical care time)  Medications Ordered in ED Medications  glucagon (human recombinant) (GLUCAGEN) injection 1 mg (1 mg Intravenous Given 10/06/20 1921)    ED Course  I have reviewed the triage vital signs and the nursing notes.  Pertinent labs & imaging results that were available during my care of the patient were reviewed by me and considered in my medical decision making (see chart for details).    MDM Rules/Calculators/A&P                          Pt given 1 mg of glucagon which did not work.  Pt unable to keep down water.    Pt d/w Dr. Hilarie Fredrickson ([facility]) who will call in the EGD team and see patient.  Pt did have an esophagus impaction with grits.  He has a stricture in that area.  Dr. Hilarie Fredrickson was able to break up the grits and push into his stomach.   Pt is now awake and alert.  He is to f/u with GI. Return if worse.  Final Clinical Impression(s) / ED Diagnoses Final diagnoses:  Food impaction of esophagus, initial encounter    Rx / DC Orders ED Discharge Orders         Ordered    pantoprazole (PROTONIX) 20 MG tablet  Daily        10/06/20 2315           Isla Pence, MD 10/06/20 2317

## 2020-10-06 NOTE — ED Triage Notes (Signed)
Pt presents with c/o food stuck in his throat. Pt reports he ate some grits around 4 this afternoon and has been unable to eat anything or drink anything since then without vomiting. Pt reports he does feel some shortness of breath because of this.

## 2020-10-06 NOTE — Discharge Instructions (Signed)
Chew food thoroughly.  You will need to follow up with GI to get your esophagus stretched in a few weeks.

## 2020-10-06 NOTE — ED Notes (Signed)
Endo team at the bedside at this time.

## 2020-10-06 NOTE — Consult Note (Signed)
Referring Provider:  Isla Pence, MD [facility])          Primary Care Physician:  Tamsen Roers, MD Primary Gastroenterologist:  Deatra Ina ([facility])          Reason for Consultation: acute esophageal food impaction                  ASSESSMENT /  PLAN   79 yo male with PMH of colon cancer, colon polyps, remote esophageal stricture, COPD, CAD, HTN, HL presenting to [facility] ED with acute esophageal food impaction.  1. Acute food impaction -- EGD emergently at bedside with moderate sedation.  The nature of the procedure, as well as the risks, benefits, and alternatives were carefully and thoroughly reviewed with the patient. Ample time for discussion and questions allowed. The patient understood, was satisfied, and agreed to proceed.   COVID test negative    HPI:     Francisco Collier is a 79 y.o. male with PMH of colon cancer, colon polyps, remote esophageal stricture, COPD, CAD, HTN, HL presenting to [facility] ED with acute esophageal food impaction.  Reports issues with intermittent dysphagia over the last month, prior to this this had not been a problem.  Tonight around 4 PM was eating grits and no longer able to swallow any liquids subsequently.  Glucagon in the ER no improvement.  He denies heartburn.  Has been on Nexium daily.  No abdominal pain.  No change in bowel habits.  No blood in stool or melena.  Had an upper endoscopy years ago by Dr. Olevia Perches which she recalls and feels that he was dilated at that time.  Again no dysphagia prior to about 4 weeks ago.  He uses one half oxycodone in the morning and in the evening for chronic arthritic pain.   Past Medical History:  Diagnosis Date  . Arthritis    back  . CAD (coronary artery disease)   . COPD (chronic obstructive pulmonary disease) ([place])   . Diverticulitis   . Esophageal stricture   . Gastric ulcer   . GERD (gastroesophageal reflux disease)   . Hyperlipemia   . Hypertension   . Kidney stone   . MI (myocardial infarction) ([place]) 2005    . Prostatic hypertrophy   . S/P orchiectomy   . Shortness of breath     Past Surgical History:  Procedure Laterality Date  . ANGIOPLASTY  2005   Stent  . APPENDECTOMY    . BACK SURGERY    . CARDIAC CATHETERIZATION N/A 11/12/2015   Procedure: Left Heart Cath and Coronary Angiography;  Surgeon: Charolette Forward, MD;  Location: [place] CV LAB;  Service: Cardiovascular;  Laterality: N/A;  . CHOLECYSTECTOMY    . CHOLECYSTECTOMY    . CORONARY ANGIOPLASTY    . THYROID SURGERY      Prior to Admission medications   Medication Sig Start Date End Date Taking? Authorizing Provider  amLODipine (NORVASC) 5 MG tablet Take 5 mg by mouth daily.    [provider]  aspirin 81 MG tablet Take 81 mg by mouth every morning.     [provider]  clopidogrel (PLAVIX) 75 MG tablet Take 75 mg by mouth daily.    [provider]  fenofibrate 160 MG tablet Take 160 mg by mouth daily.    [provider]  fluticasone (FLONASE) 50 MCG/ACT nasal spray Place 1 spray into both nostrils 2 (two) times daily as needed for allergies or rhinitis.     [provider]  HYDROcodone-acetaminophen (NORCO/VICODIN) 5-325 MG tablet Take 1-2 tablets by mouth every 4 (four) hours as needed. 12/12/17   Joy, Shawn C, PA-C  lidocaine (LIDODERM) 5 % Place 1 patch onto the skin daily. Remove & Discard patch within 12 hours or as directed by MD 12/12/17   Joy, Shawn C, PA-C  lisinopril (PRINIVIL) 10 MG tablet Take 1 tablet (10 mg total) by mouth daily. 11/13/15   Charolette Forward, MD  meclizine (ANTIVERT) 25 MG tablet Take 1 tablet (25 mg total) by mouth 2 (two) times daily as needed for dizziness. 11/28/16   Ripley Fraise, MD  methocarbamol (ROBAXIN) 500 MG tablet Take 1 tablet (500 mg total) by mouth every 6 (six) hours as needed for muscle spasms. 08/04/22   Delora Fuel, MD  metoprolol tartrate (LOPRESSOR) 25 MG tablet Take 0.5 tablets (12.5 mg total) by mouth 2 (two) times daily. 11/13/15    Charolette Forward, MD  naproxen (NAPROSYN) 375 MG tablet Take 1 tablet (375 mg total) by mouth 2 (two) times daily. 10/02/18   Wurst, [place], PA-C  nitroGLYCERIN (NITROSTAT) 0.4 MG SL tablet Place 1 tablet (0.4 mg total) under the tongue every 5 (five) minutes as needed for chest pain. 11/13/15   Charolette Forward, MD    No current facility-administered medications for this encounter.   Current Outpatient Medications  Medication Sig Dispense Refill  . amLODipine (NORVASC) 5 MG tablet Take 5 mg by mouth daily.    Marland Kitchen aspirin 81 MG tablet Take 81 mg by mouth every morning.     . clopidogrel (PLAVIX) 75 MG tablet Take 75 mg by mouth daily.    . fenofibrate 160 MG tablet Take 160 mg by mouth daily.    . fluticasone (FLONASE) 50 MCG/ACT nasal spray Place 1 spray into both nostrils 2 (two) times daily as needed for allergies or rhinitis.     Marland Kitchen HYDROcodone-acetaminophen (NORCO/VICODIN) 5-325 MG tablet Take 1-2 tablets by mouth every 4 (four) hours as needed. 20 tablet 0  . lidocaine (LIDODERM) 5 % Place 1 patch onto the skin daily. Remove & Discard patch within 12 hours or as directed by MD 30 patch 0  . lisinopril (PRINIVIL) 10 MG tablet Take 1 tablet (10 mg total) by mouth daily. 30 tablet 3  . meclizine (ANTIVERT) 25 MG tablet Take 1 tablet (25 mg total) by mouth 2 (two) times daily as needed for dizziness. 15 tablet 0  . methocarbamol (ROBAXIN) 500 MG tablet Take 1 tablet (500 mg total) by mouth every 6 (six) hours as needed for muscle spasms. 40 tablet 0  . metoprolol tartrate (LOPRESSOR) 25 MG tablet Take 0.5 tablets (12.5 mg total) by mouth 2 (two) times daily. 60 tablet 3  . naproxen (NAPROSYN) 375 MG tablet Take 1 tablet (375 mg total) by mouth 2 (two) times daily. 10 tablet 0  . nitroGLYCERIN (NITROSTAT) 0.4 MG SL tablet Place 1 tablet (0.4 mg total) under the tongue every 5 (five) minutes as needed for chest pain. 25 tablet 12    Allergies as of 10/06/2020 - Review Complete 10/06/2020  Allergen  Reaction Noted  . Codeine Nausea Only 12/04/2011  . Niaspan [niacin er] Other (See Comments) 07/21/2011  . Statins Rash 04/16/2015    Family History  Problem Relation Age of Onset  . Lung cancer Father   . Colon cancer Neg Hx   . Migraines Neg Hx       Review of Systems: All systems reviewed and negative except where noted  in HPI.  Physical Exam: Vital signs in last 24 hours: Temp:  [97.9 F (36.6 C)] 97.9 F (36.6 C) (10/26 1854) Pulse Rate:  [91-101] 91 (10/26 1930) Resp:  [16-20] 16 (10/26 1930) BP: (141-150)/(86-97) 150/86 (10/26 1930) SpO2:  [93 %-95 %] 93 % (10/26 1930)   Gen: awake, alert, NAD HEENT: anicteric, op clear CV: RRR, no mrg Pulm: CTA b/l Abd: soft, NT/ND, +BS throughout Ext: no c/c/e Neuro: nonfocal   Intake/Output from previous day: No intake/output data recorded. Intake/Output this shift: No intake/output data recorded.  Lab Results: Recent Labs    10/06/20 1920  WBC 11.0*  HGB 16.0  HCT 48.4  PLT 305   BMET Recent Labs    10/06/20 1920  NA 136  K 4.1  CL 99  CO2 29  GLUCOSE 115*  BUN 14  CREATININE 1.10  CALCIUM 8.9   LFT No results for input(s): PROT, ALBUMIN, AST, ALT, ALKPHOS, BILITOT, BILIDIR, IBILI in the last 72 hours. PT/INR No results for input(s): LABPROT, INR in the last 72 hours. Hepatitis Panel No results for input(s): HEPBSAG, HCVAB, HEPAIGM, HEPBIGM in the last 72 hours.   . CBC Latest Ref Rng & Units 10/06/2020 11/27/2016 11/13/2015  WBC 4.0 - 10.5 K/uL 11.0(H) 8.9 6.2  Hemoglobin 13.0 - 17.0 g/dL 16.0 13.9 12.7(L)  Hematocrit 39 - 52 % 48.4 40.8 37.2(L)  Platelets 150 - 400 K/uL 305 306 216    . CMP Latest Ref Rng & Units 10/06/2020 11/27/2016 11/13/2015  Glucose 70 - 99 mg/dL 115(H) 116(H) 95  BUN 8 - 23 mg/dL 14 12 12   Creatinine 0.61 - 1.24 mg/dL 1.10 1.04 1.19  Sodium 135 - 145 mmol/L 136 139 137  Potassium 3.5 - 5.1 mmol/L 4.1 3.8 3.9  Chloride 98 - 111 mmol/L 99 106 102  CO2 22 - 32 mmol/L  29 26 26   Calcium 8.9 - 10.3 mg/dL 8.9 9.1 8.5(L)  Total Protein 6.5 - 8.1 g/dL - - -  Total Bilirubin 0.3 - 1.2 mg/dL - - -  Alkaline Phos 38 - 126 U/L - - -  AST 15 - 41 U/L - - -  ALT 17 - 63 U/L - - -   Studies/Results: No results found.    Lajuan Lines. Geanna Divirgilio, M.D. @  10/06/2020, 8:45 PM

## 2020-10-06 NOTE — ED Notes (Signed)
Patient returned from [place], report received from [place], [place].

## 2020-10-07 ENCOUNTER — Encounter (HOSPITAL_COMMUNITY): Payer: Self-pay | Admitting: Internal Medicine

## 2020-11-11 ENCOUNTER — Ambulatory Visit: Payer: Medicare HMO | Admitting: Physician Assistant

## 2020-12-28 ENCOUNTER — Emergency Department (HOSPITAL_COMMUNITY): Payer: Medicare HMO

## 2020-12-28 ENCOUNTER — Encounter (HOSPITAL_COMMUNITY): Payer: Self-pay | Admitting: *Deleted

## 2020-12-28 ENCOUNTER — Inpatient Hospital Stay (HOSPITAL_COMMUNITY)
Admission: EM | Admit: 2020-12-28 | Discharge: 2020-12-30 | DRG: 062 | Disposition: A | Discharge status: Home / Self Care | Payer: Medicare HMO | Attending: Neurology | Admitting: Neurology

## 2020-12-28 ENCOUNTER — Other Ambulatory Visit (HOSPITAL_COMMUNITY): Payer: Medicare HMO

## 2020-12-28 ENCOUNTER — Other Ambulatory Visit: Payer: Self-pay

## 2020-12-28 DIAGNOSIS — I2510 Atherosclerotic heart disease of native coronary artery without angina pectoris: Secondary | ICD-10-CM | POA: Diagnosis present

## 2020-12-28 DIAGNOSIS — E669 Obesity, unspecified: Secondary | ICD-10-CM | POA: Diagnosis present

## 2020-12-28 DIAGNOSIS — Z801 Family history of malignant neoplasm of trachea, bronchus and lung: Secondary | ICD-10-CM | POA: Diagnosis not present

## 2020-12-28 DIAGNOSIS — R29704 NIHSS score 4: Secondary | ICD-10-CM | POA: Diagnosis present

## 2020-12-28 DIAGNOSIS — R29810 Facial weakness: Secondary | ICD-10-CM | POA: Diagnosis present

## 2020-12-28 DIAGNOSIS — E785 Hyperlipidemia, unspecified: Secondary | ICD-10-CM | POA: Diagnosis present

## 2020-12-28 DIAGNOSIS — J449 Chronic obstructive pulmonary disease, unspecified: Secondary | ICD-10-CM | POA: Diagnosis present

## 2020-12-28 DIAGNOSIS — Z885 Allergy status to narcotic agent status: Secondary | ICD-10-CM

## 2020-12-28 DIAGNOSIS — Z6828 Body mass index (BMI) 28.0-28.9, adult: Secondary | ICD-10-CM | POA: Diagnosis not present

## 2020-12-28 DIAGNOSIS — G459 Transient cerebral ischemic attack, unspecified: Principal | ICD-10-CM | POA: Diagnosis present

## 2020-12-28 DIAGNOSIS — F40240 Claustrophobia: Secondary | ICD-10-CM | POA: Diagnosis present

## 2020-12-28 DIAGNOSIS — Z87891 Personal history of nicotine dependence: Secondary | ICD-10-CM

## 2020-12-28 DIAGNOSIS — E119 Type 2 diabetes mellitus without complications: Secondary | ICD-10-CM | POA: Diagnosis present

## 2020-12-28 DIAGNOSIS — I10 Essential (primary) hypertension: Secondary | ICD-10-CM | POA: Diagnosis present

## 2020-12-28 DIAGNOSIS — I6389 Other cerebral infarction: Secondary | ICD-10-CM | POA: Diagnosis not present

## 2020-12-28 DIAGNOSIS — G8191 Hemiplegia, unspecified affecting right dominant side: Secondary | ICD-10-CM | POA: Diagnosis present

## 2020-12-28 DIAGNOSIS — I639 Cerebral infarction, unspecified: Secondary | ICD-10-CM | POA: Diagnosis present

## 2020-12-28 DIAGNOSIS — Z9861 Coronary angioplasty status: Secondary | ICD-10-CM | POA: Diagnosis not present

## 2020-12-28 DIAGNOSIS — Z20822 Contact with and (suspected) exposure to covid-19: Secondary | ICD-10-CM | POA: Diagnosis present

## 2020-12-28 DIAGNOSIS — Z888 Allergy status to other drugs, medicaments and biological substances status: Secondary | ICD-10-CM | POA: Diagnosis not present

## 2020-12-28 DIAGNOSIS — K219 Gastro-esophageal reflux disease without esophagitis: Secondary | ICD-10-CM | POA: Diagnosis present

## 2020-12-28 DIAGNOSIS — I252 Old myocardial infarction: Secondary | ICD-10-CM

## 2020-12-28 DIAGNOSIS — I6300 Cerebral infarction due to thrombosis of unspecified precerebral artery: Secondary | ICD-10-CM | POA: Diagnosis not present

## 2020-12-28 DIAGNOSIS — Z85038 Personal history of other malignant neoplasm of large intestine: Secondary | ICD-10-CM

## 2020-12-28 LAB — DIFFERENTIAL
Abs Immature Granulocytes: 0.04 10*3/uL (ref 0.00–0.07)
Basophils Absolute: 0 10*3/uL (ref 0.0–0.1)
Basophils Relative: 1 %
Eosinophils Absolute: 0.1 10*3/uL (ref 0.0–0.5)
Eosinophils Relative: 2 %
Immature Granulocytes: 1 %
Lymphocytes Relative: 22 %
Lymphs Abs: 1.7 10*3/uL (ref 0.7–4.0)
Monocytes Absolute: 0.6 10*3/uL (ref 0.1–1.0)
Monocytes Relative: 8 %
Neutro Abs: 5.3 10*3/uL (ref 1.7–7.7)
Neutrophils Relative %: 66 %

## 2020-12-28 LAB — I-STAT CHEM 8, ED
BUN: 16 mg/dL (ref 8–23)
Calcium, Ion: 1.24 mmol/L (ref 1.15–1.40)
Chloride: 98 mmol/L (ref 98–111)
Creatinine, Ser: 1.2 mg/dL (ref 0.61–1.24)
Glucose, Bld: 109 mg/dL — ABNORMAL HIGH (ref 70–99)
HCT: 46 % (ref 39.0–52.0)
Hemoglobin: 15.6 g/dL (ref 13.0–17.0)
Potassium: 4.2 mmol/L (ref 3.5–5.1)
Sodium: 137 mmol/L (ref 135–145)
TCO2: 30 mmol/L (ref 22–32)

## 2020-12-28 LAB — COMPREHENSIVE METABOLIC PANEL
ALT: 27 U/L (ref 0–44)
AST: 22 U/L (ref 15–41)
Albumin: 3.7 g/dL (ref 3.5–5.0)
Alkaline Phosphatase: 48 U/L (ref 38–126)
Anion gap: 10 (ref 5–15)
BUN: 13 mg/dL (ref 8–23)
CO2: 26 mmol/L (ref 22–32)
Calcium: 9.5 mg/dL (ref 8.9–10.3)
Chloride: 101 mmol/L (ref 98–111)
Creatinine, Ser: 1.24 mg/dL (ref 0.61–1.24)
GFR, Estimated: 60 mL/min — ABNORMAL LOW (ref >60)
Glucose, Bld: 115 mg/dL — ABNORMAL HIGH (ref 70–99)
Potassium: 4.3 mmol/L (ref 3.5–5.1)
Sodium: 137 mmol/L (ref 135–145)
Total Bilirubin: 0.7 mg/dL (ref 0.3–1.2)
Total Protein: 6.7 g/dL (ref 6.5–8.1)

## 2020-12-28 LAB — CBG MONITORING, ED: Glucose-Capillary: 112 mg/dL — ABNORMAL HIGH (ref 70–99)

## 2020-12-28 LAB — CBC
HCT: 46.1 % (ref 39.0–52.0)
Hemoglobin: 15.7 g/dL (ref 13.0–17.0)
MCH: 30.3 pg (ref 26.0–34.0)
MCHC: 34.1 g/dL (ref 30.0–36.0)
MCV: 89 fL (ref 80.0–100.0)
Platelets: 366 10*3/uL (ref 150–400)
RBC: 5.18 MIL/uL (ref 4.22–5.81)
RDW: 13.2 % (ref 11.5–15.5)
WBC: 7.8 10*3/uL (ref 4.0–10.5)
nRBC: 0 % (ref 0.0–0.2)

## 2020-12-28 LAB — MRSA PCR SCREENING: MRSA by PCR: NEGATIVE

## 2020-12-28 LAB — PROTIME-INR
INR: 1 (ref 0.8–1.2)
Prothrombin Time: 12.3 seconds (ref 11.4–15.2)

## 2020-12-28 LAB — APTT: aPTT: 25 seconds (ref 24–36)

## 2020-12-28 MED ORDER — SODIUM CHLORIDE 0.9 % IV SOLN: INTRAVENOUS | Status: DC

## 2020-12-28 MED ORDER — LABETALOL HCL 5 MG/ML IV SOLN: 10.0000 mg | INTRAVENOUS | Status: DC | PRN

## 2020-12-28 MED ORDER — STROKE: EARLY STAGES OF RECOVERY BOOK
Freq: Once | Status: AC
  Filled 2020-12-28: qty 1

## 2020-12-28 MED ORDER — SODIUM CHLORIDE 0.9 % IV SOLN
50.0000 mL | Freq: Once | INTRAVENOUS | Status: AC
  Administered 2020-12-28: 50 mL via INTRAVENOUS

## 2020-12-28 MED ORDER — ACETAMINOPHEN 650 MG RE SUPP: 650.0000 mg | RECTAL | Status: DC | PRN

## 2020-12-28 MED ORDER — CHLORHEXIDINE GLUCONATE CLOTH 2 % EX PADS
6.0000 | MEDICATED_PAD | Freq: Every day | CUTANEOUS | Status: DC
  Administered 2020-12-28 – 2020-12-30 (×3): 6 via TOPICAL

## 2020-12-28 MED ORDER — ALTEPLASE (STROKE) FULL DOSE INFUSION
90.0000 mg | Freq: Once | INTRAVENOUS | Status: AC
  Administered 2020-12-28: 90 mg via INTRAVENOUS
  Filled 2020-12-28: qty 100

## 2020-12-28 MED ORDER — IOHEXOL 350 MG/ML SOLN
75.0000 mL | Freq: Once | INTRAVENOUS | Status: AC | PRN
  Administered 2020-12-28: 75 mL via INTRAVENOUS

## 2020-12-28 MED ORDER — PANTOPRAZOLE SODIUM 40 MG IV SOLR
40.0000 mg | Freq: Every day | INTRAVENOUS | Status: DC
  Administered 2020-12-28 – 2020-12-29 (×2): 40 mg via INTRAVENOUS
  Filled 2020-12-28 (×2): qty 40

## 2020-12-28 MED ORDER — ONDANSETRON HCL 4 MG/2ML IJ SOLN
INTRAMUSCULAR | Status: AC
  Administered 2020-12-28: 4 mg
  Filled 2020-12-28: qty 2

## 2020-12-28 MED ORDER — SODIUM CHLORIDE 0.9% FLUSH
3.0000 mL | Freq: Once | INTRAVENOUS | Status: AC
Start: 2020-12-28 — End: 2020-12-28
  Administered 2020-12-28: 3 mL via INTRAVENOUS

## 2020-12-28 MED ORDER — ACETAMINOPHEN 160 MG/5ML PO SOLN: 650.0000 mg | ORAL | Status: DC | PRN

## 2020-12-28 MED ORDER — SENNOSIDES-DOCUSATE SODIUM 8.6-50 MG PO TABS
2.0000 | ORAL_TABLET | Freq: Every day | ORAL | Status: DC
  Administered 2020-12-29: 2 via ORAL
  Filled 2020-12-28: qty 2

## 2020-12-28 MED ORDER — ACETAMINOPHEN 325 MG PO TABS: 650.0000 mg | ORAL_TABLET | ORAL | Status: DC | PRN

## 2020-12-28 NOTE — ED Provider Notes (Signed)
MSE was initiated and I personally evaluated the patient and placed orders (if any) at  3:12 PM on December 28, 2020.  The 50 was last normal around 12 PM.  Then around 1 AM he noticed left-sided headache and then right upper extremity right lower extremity weakness.  He states he is unable to ambulate.  Denies fall or other injury.  My concern is for acute stroke and acute stroke work-up has been activated.   Luna Fuse, MD 12/28/20 [phone]

## 2020-12-28 NOTE — ED Provider Notes (Signed)
[facility] EMERGENCY DEPARTMENT Provider Note   CSN: [id number] Arrival date & time: 12/28/20  1428  An emergency department physician performed an initial assessment on this suspected stroke patient at 1506.  History Chief Complaint  Patient presents with  . Weakness    Right sided weakness, sudden at 1pm today    Francisco Collier is a 80 y.o. male.  The history is provided by the patient and the EMS personnel.  Weakness Severity:  Moderate Onset quality:  Sudden Timing:  Constant Progression:  Unchanged Chronicity:  New Relieved by:  Nothing Ineffective treatments:  None tried Associated symptoms: no abdominal pain, no arthralgias, no chest pain, no cough, no dysuria, no fever, no headaches, no seizures, no shortness of breath and no vomiting   Risk factors: coronary artery disease and heart disease   Neurologic Problem Pertinent negatives include no chest pain, no abdominal pain, no headaches and no shortness of breath.       Past Medical History:  Diagnosis Date  . Arthritis    back  . CAD (coronary artery disease)   . COPD (chronic obstructive pulmonary disease) ([place])   . Diverticulitis   . Esophageal stricture   . Gastric ulcer   . GERD (gastroesophageal reflux disease)   . Hyperlipemia   . Hypertension   . Kidney stone   . MI (myocardial infarction) ([place]) 2005  . Prostatic hypertrophy   . S/P orchiectomy   . Shortness of breath     Patient Active Problem List   Diagnosis Date Noted  . Stroke ([place]) 12/28/2020  . Food impaction of esophagus   . Esophageal stricture   . Unstable angina ([place]) 11/11/2015  . Snoring 02/13/2015  . Excessive daytime sleepiness 02/13/2015  . Morning headache 02/13/2015  . Frequent nocturnal awakening 02/13/2015  . Nocturnal headaches 02/13/2015  . 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 Obesity 02/13/2015  . Diverticulosis 07/29/2011  . 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 CAD 09/30/2009    Past Surgical History:  Procedure Laterality Date  . ANGIOPLASTY  2005   Stent  . APPENDECTOMY    . BACK SURGERY    . CARDIAC CATHETERIZATION N/A 11/12/2015   Procedure: Left Heart Cath and Coronary Angiography;  Surgeon: Charolette Forward, MD;  Location: [facility];  Service: Cardiovascular;  Laterality: N/A;  . CHOLECYSTECTOMY    . CHOLECYSTECTOMY    . CORONARY ANGIOPLASTY    . ESOPHAGOGASTRODUODENOSCOPY N/A 10/06/2020   Procedure: ESOPHAGOGASTRODUODENOSCOPY (EGD);  Surgeon: Jerene Bears, MD;  Location: [facility];  Service: Gastroenterology;  Laterality: N/A;  . FOREIGN BODY REMOVAL  10/06/2020   Procedure: FOREIGN BODY REMOVAL;  Surgeon: Jerene Bears, MD;  Location: [facility];  Service: Gastroenterology;;  . THYROID SURGERY         Family History  Problem Relation Age of Onset  . Lung cancer Father   . Colon cancer Neg Hx   . Migraines Neg Hx     Social History   Tobacco Use  . Smoking status: Former Smoker    Years: 40.00    Quit date: 12/12/1998    Years since quitting: 22.0  . Smokeless tobacco: Never Used  Vaping Use  . Vaping Use: Never used  Substance Use Topics  . Alcohol use: No  . Drug use: No    Home Medications Prior to Admission medications   Medication Sig Start Date End Date Taking? Authorizing Provider  amLODipine (NORVASC) 10 MG tablet Take 10 mg by mouth daily. 10/28/20   [provider]  amLODipine (NORVASC) 5 MG tablet Take 5 mg by mouth daily.    [provider]  aspirin 81 MG tablet Take 81 mg by mouth every morning.     [provider]  clopidogrel (PLAVIX) 75 MG tablet Take 75 mg by mouth daily.    [provider]  fenofibrate 160 MG tablet Take 160 mg by mouth daily.    [provider]  fluticasone (FLONASE) 50 MCG/ACT nasal spray Place 1 spray into both nostrils 2 (two) times daily as needed for allergies or rhinitis.     [provider]  gabapentin (NEURONTIN) 100 MG capsule Take 100 mg by mouth daily. 12/23/20   [provider]  HYDROcodone-acetaminophen (NORCO/VICODIN) 5-325 MG tablet Take 1-2 tablets by mouth every 4 (four) hours as needed. 12/12/17   Joy, Shawn C, PA-C  lidocaine (LIDODERM) 5 % Place 1 patch onto the skin daily. Remove & Discard patch within 12 hours or as directed by MD 12/12/17   Joy, Shawn C, PA-C  lisinopril (PRINIVIL) 10 MG tablet Take 1 tablet (10 mg total) by mouth daily. 11/13/15   Charolette Forward, MD  meclizine (ANTIVERT) 25 MG tablet Take 1 tablet (25 mg total) by mouth 2 (two) times daily as needed for dizziness. 11/28/16   Ripley Fraise, MD  methocarbamol (ROBAXIN) 500 MG tablet Take 1 tablet (500 mg total) by mouth every 6 (six) hours as needed for muscle spasms. [id number]   Delora Fuel, MD  metoprolol tartrate (LOPRESSOR) 25 MG tablet Take 0.5 tablets (12.5 mg total) by mouth 2 (two) times daily. 11/13/15   Charolette Forward, MD  naproxen (NAPROSYN) 375 MG tablet Take 1 tablet (375 mg total) by mouth 2 (two) times daily. 10/02/18   Wurst, [place], PA-C  nitroGLYCERIN (NITROSTAT) 0.4 MG SL tablet Place 1 tablet (0.4 mg total) under the tongue every 5 (five) minutes as needed for chest pain. 11/13/15   Charolette Forward, MD  oxyCODONE-acetaminophen (PERCOCET) 10-325 MG tablet Take 1 tablet by mouth 3 (three) times daily as needed for pain. 12/23/20   [provider]  pantoprazole (PROTONIX) 20 MG tablet Take 1 tablet (20 mg total) by mouth daily. 10/06/20   Isla Pence, MD  terazosin (HYTRIN) 1 MG capsule Take 1 mg by mouth daily. 11/23/20   [provider]    Allergies    Codeine, Niaspan [niacin er], and Statins  Review of Systems   Review of Systems  Constitutional: Negative for chills and fever.  HENT: Negative for ear pain and sore throat.   Eyes: Positive for pain (resolved). Negative for visual disturbance.  Respiratory: Negative for cough and shortness  of breath.   Cardiovascular: Negative for chest pain and palpitations.  Gastrointestinal: Negative for abdominal pain and vomiting.  Genitourinary: Negative for dysuria and hematuria.  Musculoskeletal: Negative for arthralgias and back pain.  Skin: Negative for color change and rash.  Neurological: Positive for weakness and numbness. Negative for seizures, syncope and headaches.  All other systems reviewed and are negative.   Physical Exam Updated Vital Signs BP 121/70   Pulse 94   Temp 98.8 F (37.1 C) (Oral)   Resp (!) 21   Wt 101.9 kg   SpO2 96%   BMI 28.84 kg/m   Physical Exam Vitals and nursing note reviewed.  Constitutional:      Appearance: He is well-developed and well-nourished.  HENT:  Head: Normocephalic and atraumatic.  Eyes:     Conjunctiva/sclera: Conjunctivae normal.  Cardiovascular:     Rate and Rhythm: Normal rate and regular rhythm.     Heart sounds: No murmur heard.   Pulmonary:     Effort: Pulmonary effort is normal. No respiratory distress.     Breath sounds: Normal breath sounds.  Abdominal:     Palpations: Abdomen is soft.     Tenderness: There is no abdominal tenderness.  Musculoskeletal:        General: No edema.     Cervical back: Neck supple.  Skin:    General: Skin is warm and dry.  Neurological:     Mental Status: He is alert.     GCS: GCS eye subscore is 4. GCS verbal subscore is 5. GCS motor subscore is 6.     Cranial Nerves: Cranial nerves are intact. No cranial nerve deficit.     Sensory: Sensation is intact.     Motor: Weakness (RLE, R grip strength) and pronator drift present.  Psychiatric:        Mood and Affect: Mood and affect normal.     ED Results / Procedures / Treatments   Labs (all labs ordered are listed, but only abnormal results are displayed) Labs Reviewed  COMPREHENSIVE METABOLIC PANEL - Abnormal; Notable for the following components:      Result Value   Glucose, Bld 115 (*)    GFR, Estimated 60 (*)     All other components within normal limits  I-STAT CHEM 8, ED - Abnormal; Notable for the following components:   Glucose, Bld 109 (*)    All other components within normal limits  CBG MONITORING, ED - Abnormal; Notable for the following components:   Glucose-Capillary 112 (*)    All other components within normal limits  MRSA PCR SCREENING  PROTIME-INR  APTT  CBC  DIFFERENTIAL  TSH  HEMOGLOBIN A1C  LIPID PANEL    EKG EKG Interpretation  Date/Time:  Monday December 28 2020 15:02:59 EST Ventricular Rate:  87 PR Interval:  184 QRS Duration: 80 QT Interval:  344 QTC Calculation: 413 R Axis:   75 Text Interpretation: Normal sinus rhythm Nonspecific ST abnormality Abnormal ECG Confirmed by Merrily Pew [phone]) on 12/28/2020 3:21:49 PM   Radiology CT HEAD CODE STROKE WO CONTRAST  Result Date: 12/28/2020 CLINICAL DATA:  Code stroke.  Neuro deficit, acute, stroke suspected EXAM: CT HEAD WITHOUT CONTRAST TECHNIQUE: Contiguous axial images were obtained from the base of the skull through the vertex without intravenous contrast. COMPARISON:  04/16/2015 and prior. FINDINGS: Brain: No acute infarct or intracranial hemorrhage. No mass lesion. No midline shift, ventriculomegaly or extra-axial fluid collection. Mild bifrontal predominant atrophy, unchanged. Vascular: No hyperdense vessel or unexpected calcification. Skull: No acute finding. Sinuses/Orbits: No acute finding. Other: None. ASPECTS [facility] Stroke Program Early CT Score) - Ganglionic level infarction (caudate, lentiform nuclei, internal capsule, insula, M1-M3 cortex): 7 - Supraganglionic infarction (M4-M6 cortex): 3 Total score (0-10 with 10 being normal): 10 IMPRESSION: 1. No acute intracranial process. 2. Mild bifrontal predominant atrophy. 3. ASPECTS is 10 4. Code stroke imaging results were communicated on 12/28/2020 at 3:45 pm to provider Dr. Leonel Ramsay via secure text paging. Electronically Signed   By: Primitivo Gauze M.D.    On: 12/28/2020 15:46   CT ANGIO HEAD CODE STROKE  Result Date: 12/28/2020 CLINICAL DATA:  Stroke/TIA, assess extracranial arteries; Neuro deficit, acute, stroke suspected EXAM: CT ANGIOGRAPHY HEAD AND NECK TECHNIQUE: Multidetector CT imaging  of the head and neck was performed using the standard protocol during bolus administration of intravenous contrast. Multiplanar CT image reconstructions and MIPs were obtained to evaluate the vascular anatomy. Carotid stenosis measurements (when applicable) are obtained utilizing NASCET criteria, using the distal internal carotid diameter as the denominator. CONTRAST:  10mL OMNIPAQUE IOHEXOL 350 MG/ML SOLN COMPARISON:  12/28/2020 and prior. FINDINGS: CTA NECK FINDINGS Aortic arch: Bovine arch anatomy. Mild aortic arch atheromatous disease. Imaged portion shows no evidence of aneurysm or dissection. No significant stenosis of the major arch vessel origins. Right carotid system: Patent. Minimal bifurcation atherosclerotic calcifications. Left carotid system: Patent. Minimal bifurcation atherosclerotic calcifications. Vertebral arteries: Dominant left vertebral artery. Extrinsic compression from adjacent osteophytes with moderate narrowing of the left vertebral artery at the C3-4 level. Vertebral arteries remain patent. Skeleton: No acute finding.  Multilevel spondylosis. Other neck: Confluent soft tissue mass measuring 7.4 x 3.1 x 6.9 cm involving the right parapharyngeal space with scattered calcific foci. There is extension of soft tissue into the right parotid and submandibular spaces with likely involvement of the adjacent salivary glands. Upper chest: Emphysema.  No acute finding. Review of the MIP images confirms the above findings CTA HEAD FINDINGS Anterior circulation: Patent. Bilateral carotid siphon atherosclerotic calcifications with mild narrowing of the left cavernous and supraclinoid ICA. Patent ACAs. Left A1 segment hypoplasia. Patent MCAs. Posterior  circulation: Patent vertebral arteries. The basilar and proximal superior cerebellar arteries are patent. Patent left left PCA with mild P1 segment narrowing. Patent right PCA. Mild narrowing of the right P1/P2 segments. Venous sinuses: As permitted by contrast timing, patent. Anatomic variants: Please see above Review of the MIP images confirms the above findings IMPRESSION: No large vessel occlusion or aneurysm. Moderate narrowing of the left vertebral artery at the C3-4 level secondary to adjacent osteophytosis. Mild narrowing of the left cavernous/supraclinoid ICA, left P1 and right P1/P2 segments. Right parapharyngeal, parotid and submandibular space lesion measuring 7.4 cm with calcific foci. Differential includes low-flow vascular malformation versus neoplasm. Electronically Signed   By: Primitivo Gauze M.D.   On: 12/28/2020 16:13   CT ANGIO NECK CODE STROKE  Result Date: 12/28/2020 CLINICAL DATA:  Stroke/TIA, assess extracranial arteries; Neuro deficit, acute, stroke suspected EXAM: CT ANGIOGRAPHY HEAD AND NECK TECHNIQUE: Multidetector CT imaging of the head and neck was performed using the standard protocol during bolus administration of intravenous contrast. Multiplanar CT image reconstructions and MIPs were obtained to evaluate the vascular anatomy. Carotid stenosis measurements (when applicable) are obtained utilizing NASCET criteria, using the distal internal carotid diameter as the denominator. CONTRAST:  22mL OMNIPAQUE IOHEXOL 350 MG/ML SOLN COMPARISON:  12/28/2020 and prior. FINDINGS: CTA NECK FINDINGS Aortic arch: Bovine arch anatomy. Mild aortic arch atheromatous disease. Imaged portion shows no evidence of aneurysm or dissection. No significant stenosis of the major arch vessel origins. Right carotid system: Patent. Minimal bifurcation atherosclerotic calcifications. Left carotid system: Patent. Minimal bifurcation atherosclerotic calcifications. Vertebral arteries: Dominant left vertebral  artery. Extrinsic compression from adjacent osteophytes with moderate narrowing of the left vertebral artery at the C3-4 level. Vertebral arteries remain patent. Skeleton: No acute finding.  Multilevel spondylosis. Other neck: Confluent soft tissue mass measuring 7.4 x 3.1 x 6.9 cm involving the right parapharyngeal space with scattered calcific foci. There is extension of soft tissue into the right parotid and submandibular spaces with likely involvement of the adjacent salivary glands. Upper chest: Emphysema.  No acute finding. Review of the MIP images confirms the above findings CTA HEAD FINDINGS Anterior circulation: Patent.  Bilateral carotid siphon atherosclerotic calcifications with mild narrowing of the left cavernous and supraclinoid ICA. Patent ACAs. Left A1 segment hypoplasia. Patent MCAs. Posterior circulation: Patent vertebral arteries. The basilar and proximal superior cerebellar arteries are patent. Patent left left PCA with mild P1 segment narrowing. Patent right PCA. Mild narrowing of the right P1/P2 segments. Venous sinuses: As permitted by contrast timing, patent. Anatomic variants: Please see above Review of the MIP images confirms the above findings IMPRESSION: No large vessel occlusion or aneurysm. Moderate narrowing of the left vertebral artery at the C3-4 level secondary to adjacent osteophytosis. Mild narrowing of the left cavernous/supraclinoid ICA, left P1 and right P1/P2 segments. Right parapharyngeal, parotid and submandibular space lesion measuring 7.4 cm with calcific foci. Differential includes low-flow vascular malformation versus neoplasm. Electronically Signed   By: Primitivo Gauze M.D.   On: 12/28/2020 16:13    Procedures Procedures (including critical care time)  Medications Ordered in ED Medications   stroke: mapping our early stages of recovery book (has no administration in time range)  0.9 %  sodium chloride infusion ( Intravenous Infusion Verify 12/28/20 2300)   acetaminophen (TYLENOL) tablet 650 mg (has no administration in time range)    Or  acetaminophen (TYLENOL) 160 MG/5ML solution 650 mg (has no administration in time range)    Or  acetaminophen (TYLENOL) suppository 650 mg (has no administration in time range)  senna-docusate (Senokot-S) tablet 2 tablet (2 tablets Oral Not Given 12/28/20 2055)  pantoprazole (PROTONIX) injection 40 mg (40 mg Intravenous Given 12/28/20 2123)  labetalol (NORMODYNE) injection 10 mg (has no administration in time range)  Chlorhexidine Gluconate Cloth 2 % PADS 6 each (6 each Topical Given 12/28/20 2042)  sodium chloride flush (NS) 0.9 % injection 3 mL (3 mLs Intravenous Given 12/28/20 2124)  iohexol (OMNIPAQUE) 350 MG/ML injection 75 mL (75 mLs Intravenous Contrast Given 12/28/20 1552)  alteplase (ACTIVASE) 1 mg/mL infusion 90 mg (0 mg Intravenous Stopped 12/28/20 1656)    Followed by  0.9 %  sodium chloride infusion (0 mLs Intravenous Stopped 12/28/20 1720)  ondansetron (ZOFRAN) 4 MG/2ML injection (4 mg  Given 12/28/20 2123)    ED Course  I have reviewed the triage vital signs and the nursing notes.  Pertinent labs & imaging results that were available during my care of the patient were reviewed by me and considered in my medical decision making (see chart for details).    MDM Rules/Calculators/A&P                          This is a 80 year old male with a past medical history of colon cancer, colonic polyps, remote esophageal stricture, food impaction, COPD, CAD, hypertension, hyperlipidemia, who presents emergency department for evaluation of right leg weakness.  Patient reports around 1 PM this afternoon he got up to go to the kitchen and had an episode of numbness in the bottom of his right foot.  He then had a transient brief episode of sharp pain behind his left eye that lasted only a few minutes and spontaneously resolved.  The numbness in his right foot extended into weakness of the right leg, EMS was  called.  On arrival he is afebrile, hemodynamically stable, no acute distress.  He does have some slight grip strength weakness of the right hand and questionable pronator drift of the right arm.  He does have right leg weakness, 4 out of 5 strength with hip flexion, dorsi flexion, plantar flexion.  Code  stroke was initiated, neurology was made available immediately and evaluated the patient upon arrival.  CT noncontrast of the head did not show any evidence of intracranial hemorrhage.  Based on his symptoms, it was felt the patient is a tPA candidate.  Risk and benefits were discussed by neurology with the patient who is agreeable to tPA.  tPA was initiated, CTA of the head and neck is pending as well as MRI.  Patient will be admitted for presumed CVA, post tPA treatment, further monitoring, work-up, and management.  Final Clinical Impression(s) / ED Diagnoses Final diagnoses:  Acute CVA (cerebrovascular accident) [facility])    Rx / [place] Orders ED Discharge Orders    None       Camila Li, MD 12/28/20 2332    Merrily Pew, MD 12/30/20 [id number]

## 2020-12-28 NOTE — H&P (Signed)
Neurology Admission H&P    Chief Complaint: right sided weakness and sensory deficit  HPI: Francisco Collier is an 80 y.o. male with a PMHx of CAD, MI s/p PTCA of RCA in 2005 with chronic dual antiplatelet therapy, DM II, GERD, HLD, HTN, and COPD who came to the ED today due to HA behind his left eye which radiated to his cheek. He was placed in triage. However, at 1300 hrs, he experienced acute onset of right sided weakness and sensory deficit. CODE STROKE was initiated. No personal or FMHx of stroke.   In review of chart, his last admission here was 2016 for unstable angina without worsening of CAD. He has been on Plavix 75mg  po qd and ASA 81mg  po qd per cardiology. NP does not see any outpatient cardiology notes.   BP 105/67 in triage. CBG 115.   Exam in CT showed Right facial droop, right UE/LE drift with 4/5 strength on right side, and sensory deficits to RUE/RLE. CT head was negative for acute infarct or bleeding. CTA head and neck negative for LVO. tPA was consented to by patient and was given.   Pt will be admitted to ICU under post tPA protocol.   LKW 12 noon tPA Given: yes Thrombectomy-no, no LVO.   Past Medical History:  Diagnosis Date  . Arthritis    back  . CAD (coronary artery disease)   . COPD (chronic obstructive pulmonary disease) ([place])   . Diverticulitis   . Esophageal stricture   . Gastric ulcer   . GERD (gastroesophageal reflux disease)   . Hyperlipemia   . Hypertension   . Kidney stone   . MI (myocardial infarction) ([place]) 2005  . Prostatic hypertrophy   . S/P orchiectomy   . Shortness of breath     Past Surgical History:  Procedure Laterality Date  . ANGIOPLASTY  2005   Stent  . APPENDECTOMY    . BACK SURGERY    . CARDIAC CATHETERIZATION N/A 11/12/2015   Procedure: Left Heart Cath and Coronary Angiography;  Surgeon: Charolette Forward, MD;  Location: [facility];  Service: Cardiovascular;  Laterality: N/A;  . CHOLECYSTECTOMY    . CHOLECYSTECTOMY    .  CORONARY ANGIOPLASTY    . ESOPHAGOGASTRODUODENOSCOPY N/A 10/06/2020   Procedure: ESOPHAGOGASTRODUODENOSCOPY (EGD);  Surgeon: Jerene Bears, MD;  Location: [facility];  Service: Gastroenterology;  Laterality: N/A;  . FOREIGN BODY REMOVAL  10/06/2020   Procedure: FOREIGN BODY REMOVAL;  Surgeon: Jerene Bears, MD;  Location: [facility];  Service: Gastroenterology;;  . THYROID SURGERY      Family History  Problem Relation Age of Onset  . Lung cancer Father   . Colon cancer Neg Hx   . Migraines Neg Hx    Social History:  reports that he quit smoking about 22 years ago. He quit after 40.00 years of use. He has never used smokeless tobacco. He reports that he does not drink alcohol and does not use drugs.  Allergies:  Allergies  Allergen Reactions  . Codeine Nausea Only    Pill form causes nausea Iv form does not  . Niaspan [Niacin Er] Other (See Comments)    Severe flushing  . Statins Rash    Headaches     ROS: HA behind left eye radiating to cheek. + slight dizziness when he came to ED. No CP, SOB, diplopia, or blurred vision.   Physical Examination: Blood pressure 122/66, pulse 90, temperature 98.1 F (36.7 C), temperature source Oral, resp.  rate 16, weight 101.9 kg, SpO2 98 %.  Mental status: Alert and oriented x 4.  HEENT-  Normocephalic and AT. Normal pharynx.  Cardiovascular - RRR on tele.  Lungs: Normal respiratory effort.  Abd: Soft Extremities: warm, well perfused.   NIHSS:  1a Level of Conscious: 0 1b LOC Questions: 0 1c LOC Commands: 0 2 Best Gaze: 0 3 Visual: 0 4 Facial Palsy: 1 5a Motor Arm - left: 0 5b Motor Arm - Right: 1 6a Motor Leg - Left: 0 6b Motor Leg - Right: 1 7 Limb Ataxia: 0 8 Sensory: 1 9 Best Language: 0 10 Dysarthria: 0 11 Extinct. and Inatten.: 0 TOTAL: 4  NEURO:  Mental Status: AA&Ox3.  Speech/Language: speech is without dysarthria. Naming, repetition, fluency, and comprehension intact.  Cranial Nerves:  II: PERRL. Visual  fields full. Color vision intact.  III, IV, VI: EOMI. Eyelids elevate symmetrically.  V: Sensation is intact to light touch and symmetrical to face.  VII: Minor facial droop on right.   VIII: hearing intact to voice. IX, X: Palate elevates symmetrically. Phonation is normal.  XI: Shoulder shrug 5/5. XII: tongue is midline without fasciculations. Motor: 4/5 strength to right UE/LE. 5/5 to rest of muscle groups.  Tone: is normal and bulk is normal Sensation- decreased to light touch RUE/RLE. Extinction intact.  Coordination: FTN intact bilaterally. Drift positive to RUE and RLE, but seems to be limited to pain on RLE. No drift on left side.  Gait- deferred   Results for orders placed or performed during the hospital encounter of 12/28/20 (from the past 48 hour(s))  Protime-INR     Status: None   Collection Time: 12/28/20  3:09 PM  Result Value Ref Range   Prothrombin Time 12.3 11.4 - 15.2 seconds   INR 1.0 0.8 - 1.2    Comment: (NOTE) INR goal varies based on device and disease states. Performed at [facility], [place] [address] [place], [place] [address]   APTT     Status: None   Collection Time: 12/28/20  3:09 PM  Result Value Ref Range   aPTT 25 24 - 36 seconds    Comment: Performed at [facility] [address] [place], [place] [address]  CBC     Status: None   Collection Time: 12/28/20  3:09 PM  Result Value Ref Range   WBC 7.8 4.0 - 10.5 K/uL   RBC 5.18 4.22 - 5.81 MIL/uL   Hemoglobin 15.7 13.0 - 17.0 g/dL   HCT 46.1 39.0 - 52.0 %   MCV 89.0 80.0 - 100.0 fL   MCH 30.3 26.0 - 34.0 pg   MCHC 34.1 30.0 - 36.0 g/dL   RDW 13.2 11.5 - 15.5 %   Platelets 366 150 - 400 K/uL   nRBC 0.0 0.0 - 0.2 %    Comment: Performed at [facility], [place] [address] [place],  [address]  Differential     Status: None   Collection Time: 12/28/20  3:09 PM  Result Value Ref Range   Neutrophils Relative % 66 %   Neutro Abs 5.3 1.7 - 7.7 K/uL   Lymphocytes Relative  22 %   Lymphs Abs 1.7 0.7 - 4.0 K/uL   Monocytes Relative 8 %   Monocytes Absolute 0.6 0.1 - 1.0 K/uL   Eosinophils Relative 2 %   Eosinophils Absolute 0.1 0.0 - 0.5 K/uL   Basophils Relative 1 %   Basophils Absolute 0.0 0.0 - 0.1  K/uL   Immature Granulocytes 1 %   Abs Immature Granulocytes 0.04 0.00 - 0.07 K/uL    Comment: Performed at [facility], [place] [address] [place], [place] [address]  Comprehensive metabolic panel     Status: Abnormal   Collection Time: 12/28/20  3:09 PM  Result Value Ref Range   Sodium 137 135 - 145 mmol/L   Potassium 4.3 3.5 - 5.1 mmol/L   Chloride 101 98 - 111 mmol/L   CO2 26 22 - 32 mmol/L   Glucose, Bld 115 (H) 70 - 99 mg/dL    Comment: Glucose reference range applies only to samples taken after fasting for at least 8 hours.   BUN 13 8 - 23 mg/dL   Creatinine, Ser 1.24 0.61 - 1.24 mg/dL   Calcium 9.5 8.9 - 10.3 mg/dL   Total Protein 6.7 6.5 - 8.1 g/dL   Albumin 3.7 3.5 - 5.0 g/dL   AST 22 15 - 41 U/L   ALT 27 0 - 44 U/L   Alkaline Phosphatase 48 38 - 126 U/L   Total Bilirubin 0.7 0.3 - 1.2 mg/dL   GFR, Estimated 60 (L) >60 mL/min    Comment: (NOTE) Calculated using the CKD-EPI Creatinine Equation (2021)    Anion gap 10 5 - 15    Comment: Performed at [place] [place] [address], [place],  [address]  CBG monitoring, ED     Status: Abnormal   Collection Time: 12/28/20  3:11 PM  Result Value Ref Range   Glucose-Capillary 112 (H) 70 - 99 mg/dL    Comment: Glucose reference range applies only to samples taken after fasting for at least 8 hours.   Comment 1 Notify RN    Comment 2 Document in Chart   I-stat chem 8, ED     Status: Abnormal   Collection Time: 12/28/20  3:47 PM  Result Value Ref Range   Sodium 137 135 - 145 mmol/L   Potassium 4.2 3.5 - 5.1 mmol/L   Chloride 98 98 - 111 mmol/L   BUN 16 8 - 23 mg/dL   Creatinine, Ser 1.20 0.61 - 1.24 mg/dL   Glucose, Bld 109 (H) 70 - 99 mg/dL    Comment: Glucose reference range  applies only to samples taken after fasting for at least 8 hours.   Calcium, Ion 1.24 1.15 - 1.40 mmol/L   TCO2 30 22 - 32 mmol/L   Hemoglobin 15.6 13.0 - 17.0 g/dL   HCT 46.0 39.0 - 52.0 %   CT HEAD CODE STROKE WO CONTRAST  Result Date: 12/28/2020 CLINICAL DATA:  Code stroke.  Neuro deficit, acute, stroke suspected EXAM: CT HEAD WITHOUT CONTRAST TECHNIQUE: Contiguous axial images were obtained from the base of the skull through the vertex without intravenous contrast. COMPARISON:  04/16/2015 and prior. FINDINGS: Brain: No acute infarct or intracranial hemorrhage. No mass lesion. No midline shift, ventriculomegaly or extra-axial fluid collection. Mild bifrontal predominant atrophy, unchanged. Vascular: No hyperdense vessel or unexpected calcification. Skull: No acute finding. Sinuses/Orbits: No acute finding. Other: None. ASPECTS [facility] Stroke Program Early CT Score) - Ganglionic level infarction (caudate, lentiform nuclei, internal capsule, insula, M1-M3 cortex): 7 - Supraganglionic infarction (M4-M6 cortex): 3 Total score (0-10 with 10 being normal): 10 IMPRESSION: 1. No acute intracranial process. 2. Mild bifrontal predominant atrophy. 3. ASPECTS is 10 4. Code stroke imaging results were communicated on 12/28/2020 at 3:45 pm to provider Dr. Leonel Ramsay via secure text paging. Electronically Signed   By:  Primitivo Gauze M.D.   On: 12/28/2020 15:46   CT ANGIO HEAD CODE STROKE  Result Date: 12/28/2020 CLINICAL DATA:  Stroke/TIA, assess extracranial arteries; Neuro deficit, acute, stroke suspected EXAM: CT ANGIOGRAPHY HEAD AND NECK TECHNIQUE: Multidetector CT imaging of the head and neck was performed using the standard protocol during bolus administration of intravenous contrast. Multiplanar CT image reconstructions and MIPs were obtained to evaluate the vascular anatomy. Carotid stenosis measurements (when applicable) are obtained utilizing NASCET criteria, using the distal internal carotid  diameter as the denominator. CONTRAST:  20mL OMNIPAQUE IOHEXOL 350 MG/ML SOLN COMPARISON:  12/28/2020 and prior. FINDINGS: CTA NECK FINDINGS Aortic arch: Bovine arch anatomy. Mild aortic arch atheromatous disease. Imaged portion shows no evidence of aneurysm or dissection. No significant stenosis of the major arch vessel origins. Right carotid system: Patent. Minimal bifurcation atherosclerotic calcifications. Left carotid system: Patent. Minimal bifurcation atherosclerotic calcifications. Vertebral arteries: Dominant left vertebral artery. Extrinsic compression from adjacent osteophytes with moderate narrowing of the left vertebral artery at the C3-4 level. Vertebral arteries remain patent. Skeleton: No acute finding.  Multilevel spondylosis. Other neck: Confluent soft tissue mass measuring 7.4 x 3.1 x 6.9 cm involving the right parapharyngeal space with scattered calcific foci. There is extension of soft tissue into the right parotid and submandibular spaces with likely involvement of the adjacent salivary glands. Upper chest: Emphysema.  No acute finding. Review of the MIP images confirms the above findings CTA HEAD FINDINGS Anterior circulation: Patent. Bilateral carotid siphon atherosclerotic calcifications with mild narrowing of the left cavernous and supraclinoid ICA. Patent ACAs. Left A1 segment hypoplasia. Patent MCAs. Posterior circulation: Patent vertebral arteries. The basilar and proximal superior cerebellar arteries are patent. Patent left left PCA with mild P1 segment narrowing. Patent right PCA. Mild narrowing of the right P1/P2 segments. Venous sinuses: As permitted by contrast timing, patent. Anatomic variants: Please see above Review of the MIP images confirms the above findings IMPRESSION: No large vessel occlusion or aneurysm. Moderate narrowing of the left vertebral artery at the C3-4 level secondary to adjacent osteophytosis. Mild narrowing of the left cavernous/supraclinoid ICA, left P1 and  right P1/P2 segments. Right parapharyngeal, parotid and submandibular space lesion measuring 7.4 cm with calcific foci. Differential includes low-flow vascular malformation versus neoplasm. Electronically Signed   By: Primitivo Gauze M.D.   On: 12/28/2020 16:13   CT ANGIO NECK CODE STROKE  Result Date: 12/28/2020 CLINICAL DATA:  Stroke/TIA, assess extracranial arteries; Neuro deficit, acute, stroke suspected EXAM: CT ANGIOGRAPHY HEAD AND NECK TECHNIQUE: Multidetector CT imaging of the head and neck was performed using the standard protocol during bolus administration of intravenous contrast. Multiplanar CT image reconstructions and MIPs were obtained to evaluate the vascular anatomy. Carotid stenosis measurements (when applicable) are obtained utilizing NASCET criteria, using the distal internal carotid diameter as the denominator. CONTRAST:  72mL OMNIPAQUE IOHEXOL 350 MG/ML SOLN COMPARISON:  12/28/2020 and prior. FINDINGS: CTA NECK FINDINGS Aortic arch: Bovine arch anatomy. Mild aortic arch atheromatous disease. Imaged portion shows no evidence of aneurysm or dissection. No significant stenosis of the major arch vessel origins. Right carotid system: Patent. Minimal bifurcation atherosclerotic calcifications. Left carotid system: Patent. Minimal bifurcation atherosclerotic calcifications. Vertebral arteries: Dominant left vertebral artery. Extrinsic compression from adjacent osteophytes with moderate narrowing of the left vertebral artery at the C3-4 level. Vertebral arteries remain patent. Skeleton: No acute finding.  Multilevel spondylosis. Other neck: Confluent soft tissue mass measuring 7.4 x 3.1 x 6.9 cm involving the right parapharyngeal space with scattered calcific foci. There  is extension of soft tissue into the right parotid and submandibular spaces with likely involvement of the adjacent salivary glands. Upper chest: Emphysema.  No acute finding. Review of the MIP images confirms the above  findings CTA HEAD FINDINGS Anterior circulation: Patent. Bilateral carotid siphon atherosclerotic calcifications with mild narrowing of the left cavernous and supraclinoid ICA. Patent ACAs. Left A1 segment hypoplasia. Patent MCAs. Posterior circulation: Patent vertebral arteries. The basilar and proximal superior cerebellar arteries are patent. Patent left left PCA with mild P1 segment narrowing. Patent right PCA. Mild narrowing of the right P1/P2 segments. Venous sinuses: As permitted by contrast timing, patent. Anatomic variants: Please see above Review of the MIP images confirms the above findings IMPRESSION: No large vessel occlusion or aneurysm. Moderate narrowing of the left vertebral artery at the C3-4 level secondary to adjacent osteophytosis. Mild narrowing of the left cavernous/supraclinoid ICA, left P1 and right P1/P2 segments. Right parapharyngeal, parotid and submandibular space lesion measuring 7.4 cm with calcific foci. Differential includes low-flow vascular malformation versus neoplasm. Electronically Signed   By: Primitivo Gauze M.D.   On: 12/28/2020 16:13    Assessment: 80 y.o. male with PMHx of CAD s/p MI 2005 with PTCA to RCA, chronic dual antiplatelet therapy, HLD, HTN, DM II, remote tobacco abuse, and COPD. Patient presented to ED triage today c/o left forehead HA radiating to the left cheek. He was waiting in triage when he had sudden onset of right sided weakness and sensory deficit. He was brought back as a CODE STROKE. Due to his symptoms, he was given tPA.   Stroke Risk Factors - DM II, remote tobacco abuse, CAD, HLD.   Impression Stroke  Plan:  -Admit to neuro ICU.  - MRI, MRA of the brain without contrast - Frequent neuro checks - post tPA ICU protocol. - BP control less than 180/100.  - TEE - hold Plavix and ASA x 24 hrs - Risk factor modification-check HbA1c, TSH, LDL - chart states pt is allergic to statins which cause HAs. (only see Fenofibrate on chart) -  Telemetry monitoring - PT consult, OT consult, Speech consult - Stroke team to follow  Addendum: Upon post tPA exam, pt no longer has drift in RUE and his sensation is much improved.  Patient seen by Clance Boll, NP/neurology with Dr. Leonel Ramsay. Note/plan to be revised by MD as necessary.  12/28/2020, 4:18 PM   I have seen the patient and was present throughout the evaluation reflected in the above note.  He has acute right-sided weakness with impairment in his gait and therefore IV tPA was recommended.  He has agreed to this and tPA has been started.  He will need close monitoring and secondary risk factor modification.  This patient is critically ill and at significant risk of neurological worsening, death and care requires constant monitoring of vital signs, hemodynamics,respiratory and cardiac monitoring, neurological assessment, discussion with family, other specialists and medical decision making of high complexity. I spent 50 minutes of neurocritical care time  in the care of  this patient. This was time spent independent of any time provided by nurse practitioner or PA.  Roland Rack, MD [facility] [phone]  If 7pm- 7am, please page neurology on call as listed in [facility]. 12/28/2020  6:53 PM

## 2020-12-28 NOTE — ED Notes (Signed)
Pt arrive at CT  Pt reports around 1pm started to have an headache and right side weakness at home. Pt reports taking Plavix and 81 mg of Asprin this morning

## 2020-12-28 NOTE — ED Notes (Signed)
Call to Dr. Benson Norway who is coming to see pt for LSN 1pm today

## 2020-12-28 NOTE — Code Documentation (Signed)
Stroke Response Nurse Documentation Code Documentation  Francisco Collier is a 80 y.o. male arriving to [place] ED via Private Vehicle on 12/28/2020. Code stroke was activated by ED. Patient from home where he was LKW at 1300 and now complaining of right sided weakness and sensory decreased.   Stroke team at the bedside on patient arrival. Labs drawn and patient cleared for CT in Triage. Patient to CT with team. NIHSS 4, see documentation for details and code stroke times. Patient with right facial droop, right arm weakness, right leg weakness and right decreased sensation on exam. The following imaging was completed:  CT, CTA head and neck. Patient is a candidate for tPA.  Care/Plan: Follow Post-tPA window protocol. OPTMIST Low intensity monitoring. Bedside handoff with ED RN Estanislado Spire.    Kathrin Greathouse  Stroke Response RN

## 2020-12-28 NOTE — Progress Notes (Signed)
PHARMACIST CODE STROKE RESPONSE  Notified to mix tPA at 1536 by Dr. Leonel Ramsay Delivered tPA to RN at 1538  tPA dose = 9mg  bolus over 1 minute followed by 81mg  for a total dose of 90mg  over 1 hour  Issues/delays encountered (if applicable): n/a  Francisco Collier 12/28/20 3:44 PM

## 2020-12-28 NOTE — ED Triage Notes (Signed)
Pt had a sudden onset of right sided weakness at 1pm, pt has decreased grip strength on right side and right arm drift.  Pt also has right leg weakness.  This began with some sharp pain in left side of head behind left eye. Dr. Benson Norway here to see pt.

## 2020-12-28 NOTE — Research (Addendum)
Patient is eligible for Low Intensity monitoring. NIHSS 2 and patient has no critical care needs at this time.   Reviewed OPTIMISTmain study criteria and patient verbalizes understanding of data collection, discharge questions, and 90 day follow-up. Patient Information Sheet given to patient and placed at the bedside. HE agreed to proceed with study.

## 2020-12-29 ENCOUNTER — Other Ambulatory Visit: Payer: Self-pay

## 2020-12-29 ENCOUNTER — Inpatient Hospital Stay (HOSPITAL_COMMUNITY): Payer: Medicare HMO

## 2020-12-29 DIAGNOSIS — I6389 Other cerebral infarction: Secondary | ICD-10-CM | POA: Diagnosis not present

## 2020-12-29 DIAGNOSIS — I6300 Cerebral infarction due to thrombosis of unspecified precerebral artery: Secondary | ICD-10-CM | POA: Diagnosis not present

## 2020-12-29 LAB — RAPID URINE DRUG SCREEN, HOSP PERFORMED
Amphetamines: NOT DETECTED
Barbiturates: NOT DETECTED
Benzodiazepines: NOT DETECTED
Cocaine: NOT DETECTED
Opiates: NOT DETECTED
Tetrahydrocannabinol: NOT DETECTED

## 2020-12-29 LAB — HEMOGLOBIN A1C
Hgb A1c MFr Bld: 4.7 % — ABNORMAL LOW (ref 4.8–5.6)
Mean Plasma Glucose: 88.19 mg/dL

## 2020-12-29 LAB — RESP PANEL BY RT-PCR (FLU A&B, COVID) ARPGX2
Influenza A by PCR: NEGATIVE
Influenza B by PCR: NEGATIVE
SARS Coronavirus 2 by RT PCR: NEGATIVE

## 2020-12-29 LAB — ECHOCARDIOGRAM COMPLETE
AR max vel: 3.41 cm2
AV Area VTI: 3.46 cm2
AV Area mean vel: 3.41 cm2
AV Mean grad: 4 mmHg
AV Peak grad: 6.7 mmHg
Ao pk vel: 1.29 m/s
Area-P 1/2: 2.87 cm2
P 1/2 time: 398 msec
S' Lateral: 3.6 cm
Weight: 3594.38 oz

## 2020-12-29 LAB — LIPID PANEL
Cholesterol: 100 mg/dL (ref 0–200)
HDL: 29 mg/dL — ABNORMAL LOW (ref >40)
LDL Cholesterol: 55 mg/dL (ref 0–99)
Total CHOL/HDL Ratio: 3.4 RATIO
Triglycerides: 79 mg/dL (ref <150)
VLDL: 16 mg/dL (ref 0–40)

## 2020-12-29 LAB — TSH: TSH: 2.983 u[IU]/mL (ref 0.350–4.500)

## 2020-12-29 LAB — GLUCOSE, CAPILLARY: Glucose-Capillary: 98 mg/dL (ref 70–99)

## 2020-12-29 MED ORDER — LORAZEPAM 2 MG/ML IJ SOLN: 0.5000 mg | Freq: Once | INTRAMUSCULAR | Status: DC | PRN

## 2020-12-29 MED ORDER — CLOPIDOGREL BISULFATE 75 MG PO TABS
75.0000 mg | ORAL_TABLET | Freq: Every day | ORAL | Status: DC
Start: 2020-12-29 — End: 2020-12-30
  Administered 2020-12-29 – 2020-12-30 (×2): 75 mg via ORAL
  Filled 2020-12-29 (×2): qty 1

## 2020-12-29 MED ORDER — LORAZEPAM 2 MG/ML IJ SOLN: 1.0000 mg | Freq: Once | INTRAMUSCULAR | Status: DC | PRN

## 2020-12-29 MED ORDER — LORAZEPAM 0.5 MG PO TABS: 0.5000 mg | ORAL_TABLET | Freq: Once | ORAL | Status: DC | PRN

## 2020-12-29 MED ORDER — FENOFIBRATE 160 MG PO TABS
160.0000 mg | ORAL_TABLET | Freq: Every day | ORAL | Status: DC
Start: 2020-12-29 — End: 2020-12-30
  Administered 2020-12-29 – 2020-12-30 (×2): 160 mg via ORAL
  Filled 2020-12-29 (×2): qty 1

## 2020-12-29 MED ORDER — ASPIRIN EC 81 MG PO TBEC: 81.0000 mg | DELAYED_RELEASE_TABLET | Freq: Every morning | ORAL | Status: DC

## 2020-12-29 MED ORDER — CLOPIDOGREL BISULFATE 75 MG PO TABS: 75.0000 mg | ORAL_TABLET | Freq: Every day | ORAL | Status: DC

## 2020-12-29 MED ORDER — LORAZEPAM 2 MG/ML IJ SOLN
INTRAMUSCULAR | Status: AC
  Administered 2020-12-29: 1 mg via INTRAVENOUS
  Filled 2020-12-29: qty 1

## 2020-12-29 MED ORDER — PERFLUTREN LIPID MICROSPHERE
1.0000 mL | INTRAVENOUS | Status: AC | PRN
  Administered 2020-12-29: 3 mL via INTRAVENOUS
  Filled 2020-12-29: qty 10

## 2020-12-29 MED ORDER — ENOXAPARIN SODIUM 40 MG/0.4ML ~~LOC~~ SOLN
40.0000 mg | SUBCUTANEOUS | Status: DC
  Administered 2020-12-29: 40 mg via SUBCUTANEOUS
  Filled 2020-12-29: qty 0.4

## 2020-12-29 MED ORDER — FLUTICASONE PROPIONATE 50 MCG/ACT NA SUSP
1.0000 | Freq: Two times a day (BID) | NASAL | Status: DC | PRN
  Filled 2020-12-29: qty 16

## 2020-12-29 MED ORDER — LORAZEPAM 1 MG PO TABS: 1.0000 mg | ORAL_TABLET | Freq: Once | ORAL | Status: DC | PRN

## 2020-12-29 MED ORDER — ASPIRIN EC 81 MG PO TBEC
81.0000 mg | DELAYED_RELEASE_TABLET | Freq: Every morning | ORAL | Status: DC
  Administered 2020-12-29 – 2020-12-30 (×2): 81 mg via ORAL
  Filled 2020-12-29 (×2): qty 1

## 2020-12-29 MED ORDER — LORAZEPAM 2 MG/ML IJ SOLN: 1.0000 mg | Freq: Once | INTRAMUSCULAR | Status: AC

## 2020-12-29 NOTE — Evaluation (Signed)
Physical Therapy Evaluation Patient Details Name: Francisco Collier MRN: [id number] DOB: 11-01-1941 Today's Date: 12/29/2020   History of Present Illness  80 yo male s/p  R facial droop, HA behind L eye radiating to cheek  S/p TPA,CT negative MRI Pending  NIH 2 PMHx of CAD, MI s/p PTCA of RCA in 2005 with chronic dual antiplatelet therapy, DM II, GERD, HLD, HTN, and COPD  Clinical Impression  Pt is close to baseline functioning and should be safe at home as at baseline. There are no further acute PT needs.  Will sign off at this time.     Follow Up Recommendations No PT follow up    Equipment Recommendations  None recommended by PT    Recommendations for Other Services       Precautions / Restrictions Precautions Precautions: Fall      Mobility  Bed Mobility Overal bed mobility: Independent                  Transfers Overall transfer level: Modified independent               General transfer comment: used UE's appropriately  Ambulation/Gait Ambulation/Gait assistance: Supervision Gait Distance (Feet): 200 Feet Assistive device: Rolling walker (2 wheeled) (80 feet with no AD or rail) Gait Pattern/deviations: Step-through pattern     General Gait Details: occaisional unsteadiness that pt manages without LOB,  Pt actually more controlled without RW at this point, because pt is not used to the RW.  Pt able to scan, but not able to speed up cadence significantly.  Stairs Stairs: Yes Stairs assistance: Min guard Stair Management: One rail Right;Step to pattern;Forwards Number of Stairs: 3 General stair comments: safe with the rail, but rarely has to negotiate stairs.  Has ramp at home.  Wheelchair Mobility    Modified Rankin (Stroke Patients Only) Modified Rankin (Stroke Patients Only) Pre-Morbid Rankin Score: No significant disability Modified Rankin: No significant disability (No stroke noted on MRI)     Balance Overall balance assessment: Needs  assistance   Sitting balance-Leahy Scale: Good     Standing balance support: Single extremity supported;No upper extremity supported;During functional activity Standing balance-Leahy Scale: Good                               Pertinent Vitals/Pain Pain Assessment: No/denies pain    Home Living Family/patient expects to be discharged to:: Private residence Living Arrangements: Alone   Type of Home: Mobile home (doublewide) Home Access: Ramped entrance     Home Layout: One level Home Equipment: Wheelchair - manual;Cane - single point;Walker - 4 wheels;Cane - quad;Shower seat Additional Comments: dog names peanut-    Prior Function Level of Independence: Independent         Comments: uses cane on occassion if hip bothering him     Hand Dominance   Dominant Hand: Right    Extremity/Trunk Assessment   Upper Extremity Assessment Upper Extremity Assessment: Overall WFL for tasks assessed    Lower Extremity Assessment Lower Extremity Assessment: Overall WFL for tasks assessed (general weakness R LE)    Cervical / Trunk Assessment Cervical / Trunk Assessment: Normal  Communication   Communication: No difficulties  Cognition Arousal/Alertness: Awake/alert Behavior During Therapy: WFL for tasks assessed/performed Overall Cognitive Status: Within Functional Limits for tasks assessed  General Comments General comments (skin integrity, edema, etc.): vss, pt reports he does not feel any differences from his normal baseline.  He reports mild instability, but no falls.  Reports he gets out with friend and can use his SPC to walk outside.    Exercises     Assessment/Plan    PT Assessment Patent does not need any further PT services  PT Problem List         PT Treatment Interventions      PT Goals (Current goals can be found in the Care Plan section)  Acute Rehab PT Goals Patient Stated Goal: home  today PT Goal Formulation: All assessment and education complete, DC therapy    Frequency     Barriers to discharge        Co-evaluation               AM-PAC PT "6 Clicks" Mobility  Outcome Measure Help needed turning from your back to your side while in a flat bed without using bedrails?: None Help needed moving from lying on your back to sitting on the side of a flat bed without using bedrails?: None Help needed moving to and from a bed to a chair (including a wheelchair)?: None Help needed standing up from a chair using your arms (e.g., wheelchair or bedside chair)?: None Help needed to walk in hospital room?: A Little Help needed climbing 3-5 steps with a railing? : A Little 6 Click Score: 22    End of Session   Activity Tolerance: Patient tolerated treatment well Patient left: Other (comment);with call bell/phone within reach (bathroom) Nurse Communication: Mobility status PT Visit Diagnosis: Unsteadiness on feet (R26.81)    Time: 9678-9381 PT Time Calculation (min) (ACUTE ONLY): 18 min   Charges:   PT Evaluation $PT Eval Low Complexity: 1 Low          12/29/2020  Ginger Carne., PT Acute Rehabilitation Services [phone]  (pager) [phone]  (office)  Tessie Fass [place] 12/29/2020, 4:06 PM

## 2020-12-29 NOTE — Progress Notes (Addendum)
STROKE TEAM PROGRESS NOTE   HPI per record: Francisco Collier is an 80 y.o. male with a PMHx of CAD, MI s/p PTCA of RCA in 2005 with chronic dual antiplatelet therapy, DM II, GERD, HLD, HTN, and COPD who came to the ED today due to HA behind his left eye which radiated to his cheek. He was placed in triage. However, at 1300 hrs, he experienced acute onset of right sided weakness and sensory deficit. CODE STROKE was initiated. No personal or FMHx of stroke. In review of chart, his last admission here was 2016 for unstable angina without worsening of CAD. He has been on Plavix 75mg  po qd and ASA 81mg  po qd per cardiology. NP does not see any outpatient cardiology notes.  BP 105/67 in triage. CBG 115. Exam in CT showed Right facial droop, right UE/LE drift with 4/5 strength on right side, and sensory deficits to RUE/RLE. CT head was negative for acute infarct or bleeding. CTA head and neck negative for LVO. tPA was consented to by patient and was given. Pt will be admitted to ICU under post tPA protocol.   INTERVAL HISTORY No acute overnight events. Patient evaluated at bedside this morning, no family in the room. Patient states he was in his usual state of health at home when he had one shot of headache and after that started experiencing acute onset of right-sided weakness and sensory deficit and presented to ER.  States he was taking only aspirin 81 mg at home, not Plavix.  He states he feels much closer to his baseline today and feels like ready to go home. He is explained that he will be moved out of ICU today but needs to stay may be an extra day to complete a full stroke follow-up for prevention of any future strokes. Patient shows good understanding about the plan. He denies any new complaints today but states he is claustrophobic and would have difficulty if he has to go for MRI. He is explained that he would get Ativan to help him relax during MRI.  Blood pressure well controlled blood pressure well  controlled.  Neurological exam improving.  Patient is stable enough to move out of ICU to progressive neurological unit. Blood pressure is adequately controlled Vitals:   12/29/20 0800 12/29/20 1000 12/29/20 1200 12/29/20 1400  BP:  119/70  113/75  Pulse:  92    Resp:  (!) 25  (!) 33  Temp: 98.2 F (36.8 C)     TempSrc: Oral     SpO2:  94%  94%  Weight:   101.9 kg   Height:   6\' 2"  (1.88 m)    CBC:  Recent Labs  Lab 12/28/20 1509 12/28/20 1547  WBC 7.8  --   NEUTROABS 5.3  --   HGB 15.7 15.6  HCT 46.1 46.0  MCV 89.0  --   PLT 366  --    Basic Metabolic Panel:  Recent Labs  Lab 12/28/20 1509 12/28/20 1547  NA 137 137  K 4.3 4.2  CL 101 98  CO2 26  --   GLUCOSE 115* 109*  BUN 13 16  CREATININE 1.24 1.20  CALCIUM 9.5  --    Lipid Panel:  Recent Labs  Lab 12/29/20 0314  CHOL 100  TRIG 79  HDL 29*  CHOLHDL 3.4  VLDL 16  LDLCALC 55   HgbA1c:  Recent Labs  Lab 12/29/20 0314  HGBA1C 4.7*    IMAGING past 24 hours  CT  head code stroke wo contrast 12/28/2020  IMPRESSION: 1. No acute intracranial process. 2. Mild bifrontal predominant atrophy. 3. ASPECTS is 10  CT Angio Head Neck code stroke 12/28/2020  IMPRESSION: No large vessel occlusion or aneurysm. Moderate narrowing of the left vertebral artery at the C3-4 level secondary to adjacent osteophytosis.  Mild narrowing of the left cavernous/supraclinoid ICA, left P1 and right P1/P2 segments.  Right parapharyngeal, parotid and submandibular space lesion measuring 7.4 cm with calcific foci. Differential includes low-flow vascular malformation versus neoplasm.  MR brain 12/29/2020  IMPRESSION: No acute intracranial process.  Mild cerebral atrophy and chronic microvascular ischemic changes.  Right neck lesion, better characterized on recent CTA neck.  Echocardiogram 12/29/2020  IMPRESSIONS    1. Left ventricular ejection fraction, by estimation, is 55 to 60%. The  left ventricle  has normal function. The left ventricle has no regional  wall motion abnormalities. There is moderate left ventricular hypertrophy.  Left ventricular diastolic  parameters are consistent with Grade I diastolic dysfunction (impaired  relaxation).  2. Right ventricular systolic function is normal. The right ventricular  size is normal.  3. The mitral valve is normal in structure. No evidence of mitral valve  regurgitation. No evidence of mitral stenosis.  4. The aortic valve has an indeterminant number of cusps. Aortic valve  regurgitation is trivial. No aortic stenosis is present.  5. Aortic dilatation noted. There is mild dilatation of the aortic root,  measuring 40 mm.  6. The inferior vena cava is normal in size with greater than 50%  respiratory variability, suggesting right atrial pressure of 3 mmHg.     PHYSICAL EXAM General: Well developed, well nourished Caucasian male lying comfortably in bed, NAD HEENT-  Normocephalic and AT. Normal pharynx.  Cardiovascular - regular rate and rhythm Lungs: Normal respiratory effort.  Abd: Soft, nondistended, no tenderness Extremities: warm, well perfused, no peripheral edema Neurological: Mental Status:  Alert, oriented, thought content appropriate.  Speech fluent without evidence of aphasia.  Able to follow 3 step commands without difficulty. Cranial Nerves: II:  Visual fields grossly normal, pupils equal, round, reactive to light and accommodation III,IV, VI: ptosis not present, extra-ocular motions intact bilaterally V,VII: smile symmetric, facial light touch sensation normal bilaterally VIII: hearing slightly diminished  bilaterally IX,X: uvula rises symmetrically XI: bilateral shoulder shrug XII: midline tongue extension without atrophy or fasciculations  Motor: Right : Upper extremity   5/5    Left:     Upper extremity   5/5  Lower extremity   5/5     Lower extremity   5/5 Tone and bulk:normal tone throughout; no atrophy  noted Sensory: Pinprick and light touch mildly decreased on in RUE and RLE Deep Tendon Reflexes:  Right: Upper Extremity   Left: Upper extremity   biceps (C-5 to C-6) 2/4   biceps (C-5 to C-6) 2/4 tricep (C7) 2/4    triceps (C7) 2/4 Brachioradialis (C6) 2/4  Brachioradialis (C6) 2/4  Lower Extremity Lower Extremity  quadriceps (L-2 to L-4) 2/4   quadriceps (L-2 to L-4) 2/4 Achilles (S1) 2/4   Achilles (S1) 2/4  Plantars: Right: downgoing   Left: downgoing Cerebellar: normal finger-to-nose,  normal heel-to-shin test Gait: Deferred     ASSESSMENT/PLAN Francisco Collier is a 80 y.o. male withPMHx of CAD, MI s/p PTCA of RCA in 2005 with chronic dual antiplatelet therapy, DM II, GERD, HLD, HTN, and COPD who came to the ED today due to HA behind his left eye which radiated to  his cheek. It resolved quickly followed by right sided weakness. CT head does not any acute intracranial process along with MRI.  tPA was given as patient was in window 4.5 hours.  Transient ischemic attack likely due to small vessel disease symptoms improved significantly after tPA and close to baseline.  Code Stroke CT head: No acute intracranial process.    CTA head & neck: No large vessel occlusion or aneurysm.  MRI: No acute intracranial process.  2D Echo: Left ventricular ejection fraction, by estimation, is 55 to 60%. The  left ventricle has normal function.  LDL 55  HgbA1c 4.7  VTE prophylaxis -Lovenox 40 mg    Diet   Diet Heart Room service appropriate? Yes; Fluid consistency: Thin    aspirin 81 mg daily prior to admission, now on aspirin 81 mg daily and clopidogrel 75 mg daily.  Continue DAPT for 3 weeks followed by Plavix 75 mg alone.  Therapy recommendations: Pending  Disposition: Pending  Hypertension  Home meds: Norvasc 10 mg, lisinopril 10 mg, metoprolol 25 mg  Stable, labetalol 10 mg as needed . Permissive hypertension (OK if < 220/120) but gradually normalize in 5-7  days . Long-term BP goal normotensive  Hyperlipidemia  Home meds: Fenofibrate 160 mg  LDL 55, goal < 70  Resumed fenofibrate 160 mg  Continue statin at discharge   Other Stroke Risk Factors  Advanced Age >/= 74   Coronary artery disease   Other Active Problems  GERD: Pantoprazole  Hospital day # 1 I have personally obtained history,examined this patient, reviewed notes, independently viewed imaging studies, participated in medical decision making and plan of care.ROS completed by me personally and pertinent positives fully documented  I have made any additions or clarifications directly to the above note. Agree with note above. He presented for evaluation for transient retro-orbital headache but then subsequently while waiting in triage developed right hemiparesis and numbness and was given IV tPA and has made near complete recovery. MRI is negative for acute stroke. Continue strict blood pressure control and close neurological monitoring as per post tPA protocol. Continue ongoing stroke work-up and aggressive risk factor modification. This patient is critically ill and at significant risk of neurological worsening, death and care requires constant monitoring of vital signs, hemodynamics,respiratory and cardiac monitoring, extensive review of multiple databases, frequent neurological assessment, discussion with family, other specialists and medical decision making of high complexity.I have made any additions or clarifications directly to the above note.This critical care time does not reflect procedure time, or teaching time or supervisory time of PA/NP/Med Resident etc but could involve care discussion time.  I spent 30 minutes of neurocritical care time  in the care of  this patient.      Antony Contras, MD Medical Director [place] Pager: [phone] 12/29/2020 3:37 PM   To contact Stroke Continuity provider, please refer to [email or web address] After hours, contact  General Neurology

## 2020-12-29 NOTE — Progress Notes (Signed)
  Echocardiogram 2D Echocardiogram has been performed with [facility].  Francisco Collier 12/29/2020, 10:43 AM

## 2020-12-29 NOTE — Evaluation (Signed)
Speech Language Pathology Evaluation Patient Details Name: Francisco Collier MRN: [id number] DOB: 06-Aug-1941 Today's Date: 12/29/2020 Time: 3825-0539 SLP Time Calculation (min) (ACUTE ONLY): 16 min  Problem List:  Patient Active Problem List   Diagnosis Date Noted  . Stroke ([place]) 12/28/2020  . Food impaction of esophagus   . Esophageal stricture   . Unstable angina ([place]) 11/11/2015  . Snoring 02/13/2015  . Excessive daytime sleepiness 02/13/2015  . Morning headache 02/13/2015  . Frequent nocturnal awakening 02/13/2015  . Nocturnal headaches 02/13/2015  . 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 Obesity 02/13/2015  . Diverticulosis 07/29/2011  . 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 CAD 09/30/2009   Past Medical History:  Past Medical History:  Diagnosis Date  . Arthritis    back  . CAD (coronary artery disease)   . COPD (chronic obstructive pulmonary disease) ([place])   . Diverticulitis   . Esophageal stricture   . Gastric ulcer   . GERD (gastroesophageal reflux disease)   . Hyperlipemia   . Hypertension   . Kidney stone   . MI (myocardial infarction) ([place]) 2005  . Prostatic hypertrophy   . S/P orchiectomy   . Shortness of breath    Past Surgical History:  Past Surgical History:  Procedure Laterality Date  . ANGIOPLASTY  2005   Stent  . APPENDECTOMY    . BACK SURGERY    . CARDIAC CATHETERIZATION N/A 11/12/2015   Procedure: Left Heart Cath and Coronary Angiography;  Surgeon: Charolette Forward, MD;  Location: [facility];  Service: Cardiovascular;  Laterality: N/A;  . CHOLECYSTECTOMY    . CHOLECYSTECTOMY    . CORONARY ANGIOPLASTY    . ESOPHAGOGASTRODUODENOSCOPY N/A 10/06/2020   Procedure: ESOPHAGOGASTRODUODENOSCOPY (EGD);  Surgeon: Jerene Bears, MD;  Location: [facility];  Service: Gastroenterology;  Laterality: N/A;  . FOREIGN BODY REMOVAL  10/06/2020   Procedure: FOREIGN BODY REMOVAL;  Surgeon: Jerene Bears,  MD;  Location: [facility];  Service: Gastroenterology;;  . THYROID SURGERY     HPI:  80 y.o. male with PMHx of CAD s/p MI 2005 with PTCA to RCA, chronic dual antiplatelet therapy, HLD, HTN, DM II, remote tobacco abuse, and COPD. Patient presented to ED 1/17 c/o left forehead HA radiating to the left cheek. He was waiting in triage when he had sudden onset of right sided weakness and sensory deficit. He was brought back as a CODE STROKE. Due to his symptoms, he was given tPA.   Assessment / Plan / Recommendation Clinical Impression  Pt presents with resolved deficits in speech. Output is fluent and without dysarthria.  Speech rate is rapid and intelligibility can be inhibited by speed and absence of dentures, but likely at baseline. Pt able to read basic information (tenth grade education). Expressive and receptive language are WNL. He tends to respond to questions with some impulsivity, leading to occasional errors. When cued to slow down and take his time, performance improved.  No SLP therapy is needed - pt is likely at baseline. Our service will sign off.    SLP Assessment  SLP Recommendation/Assessment: Patient does not need any further Speech Lanaguage Pathology Services SLP Visit Diagnosis: Cognitive communication deficit (R41.841)    Follow Up Recommendations  None    Frequency and Duration   n/a        SLP Evaluation Cognition  Overall Cognitive Status: Within Functional Limits for tasks assessed Arousal/Alertness: Awake/alert Orientation Level: Oriented X4 Attention: Selective  Selective Attention: Appears intact       Comprehension  Auditory Comprehension Overall Auditory Comprehension: Appears within functional limits for tasks assessed Yes/No Questions: Within Functional Limits Commands: Within Functional Limits Visual Recognition/Discrimination Discrimination: Within Function Limits Reading Comprehension Reading Status: Within funtional limits (some literacy  problems at baseline)    Expression Expression Primary Mode of Expression: Verbal Verbal Expression Overall Verbal Expression: Appears within functional limits for tasks assessed Initiation: No impairment Level of Generative/Spontaneous Verbalization: Conversation Repetition: No impairment Naming: No impairment Pragmatics: No impairment Written Expression Dominant Hand: Right Written Expression: Not tested   Oral / Motor  Oral Motor/Sensory Function Overall Oral Motor/Sensory Function: Within functional limits Motor Speech Overall Motor Speech: Appears within functional limits for tasks assessed                      Jaymee Tilson L. Tivis Ringer, [place] CCC/SLP Acute Rehabilitation Services Office number [phone] Pager [phone]  Juan Quam Laurice 12/29/2020, 12:50 PM

## 2020-12-29 NOTE — Evaluation (Signed)
Occupational Therapy Evaluation Patient Details Name: Francisco Collier MRN: [id number] DOB: 12-10-1941 Today's Date: 12/29/2020    History of Present Illness 80 yo male s/p  R facial droop, HA behind L eye radiating to cheek  S/p TPA,CT negative MRI Pending  NIH 2 PMHx of CAD, MI s/p PTCA of RCA in 2005 with chronic dual antiplatelet therapy, DM II, GERD, HLD, HTN, and COPD   Clinical Impression   Patient evaluated by Occupational Therapy with no further acute OT needs identified. All education has been completed and the patient has no further questions. See below for any follow-up Occupational Therapy or equipment needs. OT to sign off. Thank you for referral.      Follow Up Recommendations  No OT follow up    Equipment Recommendations  None recommended by OT    Recommendations for Other Services       Precautions / Restrictions Precautions Precautions: Fall      Mobility Bed Mobility Overal bed mobility: Independent                  Transfers Overall transfer level: Modified independent               General transfer comment: used UE's appropriately    Balance Overall balance assessment: Needs assistance   Sitting balance-Leahy Scale: Good     Standing balance support: Single extremity supported;No upper extremity supported;During functional activity Standing balance-Leahy Scale: Good                             ADL either performed or assessed with clinical judgement   ADL Overall ADL's : Modified independent                                       General ADL Comments: self feeding on arrival and using fine motor  for crackers in peanut butter. pt dressing LB     Vision Baseline Vision/History: Wears glasses Wears Glasses: At all times Vision Assessment?: No apparent visual deficits     Perception     Praxis      Pertinent Vitals/Pain Pain Assessment: No/denies pain     Hand Dominance Right    Extremity/Trunk Assessment Upper Extremity Assessment Upper Extremity Assessment: Overall WFL for tasks assessed   Lower Extremity Assessment Lower Extremity Assessment: Overall WFL for tasks assessed (general weakness R LE)   Cervical / Trunk Assessment Cervical / Trunk Assessment: Normal   Communication Communication Communication: No difficulties   Cognition Arousal/Alertness: Awake/alert Behavior During Therapy: WFL for tasks assessed/performed Overall Cognitive Status: Within Functional Limits for tasks assessed                                     General Comments  vss, pt reports he does not feel any differences from his normal baseline.  He reports mild instability, but no falls.  Reports he gets out with friend and can use his SPC to walk outside.    Exercises     Shoulder Instructions      Home Living Family/patient expects to be discharged to:: Private residence Living Arrangements: Alone   Type of Home: Mobile home (doublewide) Home Access: Ramped entrance     Home Layout: One level     Bathroom Shower/Tub: Tub/shower  unit   Bathroom Toilet: Handicapped height     Home Equipment: Wheelchair - manual;Cane - single point;Walker - 4 wheels;Cane - quad;Shower seat   Additional Comments: dog names peanut-      Prior Functioning/Environment Level of Independence: Independent        Comments: uses cane on occassion if hip bothering him        OT Problem List:        OT Treatment/Interventions:      OT Goals(Current goals can be found in the care plan section) Acute Rehab OT Goals Patient Stated Goal: home today OT Goal Formulation: All assessment and education complete, DC therapy  OT Frequency:     Barriers to D/C:            Co-evaluation              AM-PAC OT "6 Clicks" Daily Activity     Outcome Measure Help from another person eating meals?: None Help from another person taking care of personal grooming?:  None Help from another person toileting, which includes using toliet, bedpan, or urinal?: None Help from another person bathing (including washing, rinsing, drying)?: None Help from another person to put on and taking off regular upper body clothing?: None Help from another person to put on and taking off regular lower body clothing?: None 6 Click Score: 24   End of Session Equipment Utilized During Treatment: Rolling walker Nurse Communication: Mobility status  Activity Tolerance: Patient tolerated treatment well Patient left: Other (comment) (up in hall with PT Yvone Neu)  OT Visit Diagnosis: Unsteadiness on feet (R26.81)                Time: 1017-5102 OT Time Calculation (min): 18 min Charges:  OT General Charges $OT Visit: 1 Visit OT Evaluation $OT Eval Low Complexity: 1 Low   Brynn, OTR/L  Acute Rehabilitation Services Pager: [phone] Office: [phone] .   Jeri Modena 12/29/2020, 4:07 PM

## 2020-12-29 NOTE — Progress Notes (Signed)
OT Cancellation Note  Patient Details Name: Francisco Collier MRN: [id number] DOB: 1941-11-13   Cancelled Treatment:    Reason Eval/Treat Not Completed: Patient not medically ready;Active bedrest order OT order received and appreciated however this conflicts with current bedrest order set. Please increase activity tolerance as appropriate and remove bedrest from orders. . Please contact OT at [phone] if bed rest order is discontinued. OT will hold evaluation at this time and will check back as time allows pending increased activity orders.   Billey Chang, OTR/L  Acute Rehabilitation Services Pager: [phone] Office: [phone] .  12/29/2020, 9:30 AM

## 2020-12-30 LAB — GLUCOSE, CAPILLARY
Glucose-Capillary: 96 mg/dL (ref 70–99)
Glucose-Capillary: 102 mg/dL — ABNORMAL HIGH (ref 70–99)

## 2020-12-30 MED ORDER — ASPIRIN EC 81 MG PO TBEC: 81.0000 mg | DELAYED_RELEASE_TABLET | Freq: Every day | ORAL | 0 refills | Status: AC

## 2020-12-30 MED ORDER — CLOPIDOGREL BISULFATE 75 MG PO TABS: 75.0000 mg | ORAL_TABLET | Freq: Every day | ORAL | 0 refills | Status: AC

## 2020-12-30 NOTE — Hospital Course (Signed)
Dear [name],  You presented to hospital after mini stroke which got resolved quickly after you received a treatment called tPA.  At this point you are stable enough to go home.  But please see below for things to do:  1.  Please continue taking aspirin 81 mg and Plavix 75 mg for 3 weeks and then stop taking aspirin 81 mg and just continue with Plavix 75 mg.  2. Please see your PCP within a week for a follow-up visit.  3. Please see neurology in 6 weeks, call to make an appointment.  4. Please take your blood pressure medication regularly.  5. Please take your statins regularly.  It was pleasure taking care of you!  Dr. Demaris Callander

## 2020-12-30 NOTE — Progress Notes (Addendum)
STROKE TEAM PROGRESS NOTE    INTERVAL HISTORY No overnight events.  Patient evaluated at bedside this morning, no family in the room.  Patient states he feels at his baseline and has no complaints today.  Adds, that he would like to go home and his brother would be able to pick him up.  Patient is alert, awake and oriented.  He states he understands he had a mini stroke and would see his PCP within a week after discharge and neurologist in 6 weeks.  He understands he needs to take aspirin and Plavix for 3 weeks and then Plavix alone after that.  PT/OT suggest any follow-up.  Blood pressure well controlled.  Neurological exam improved and at baseline.  Patient is stable enough to go home today.  Vitals:   12/29/20 2209 12/30/20 0001 12/30/20 0316 12/30/20 0812  BP: 119/83 134/75 121/74 129/71  Pulse: 88 87 88 78  Resp: 17 17 16 16   Temp: 97.8 F (36.6 C) 98.2 F (36.8 C) 98.5 F (36.9 C) 97.8 F (36.6 C)  TempSrc: Oral   Oral  SpO2: 95% 94% 98% 91%  Weight:      Height:       CBC:  Recent Labs  Lab 12/28/20 1509 12/28/20 1547  WBC 7.8  --   NEUTROABS 5.3  --   HGB 15.7 15.6  HCT 46.1 46.0  MCV 89.0  --   PLT 366  --    Basic Metabolic Panel:  Recent Labs  Lab 12/28/20 1509 12/28/20 1547  NA 137 137  K 4.3 4.2  CL 101 98  CO2 26  --   GLUCOSE 115* 109*  BUN 13 16  CREATININE 1.24 1.20  CALCIUM 9.5  --    Lipid Panel:  Recent Labs  Lab 12/29/20 0314  CHOL 100  TRIG 79  HDL 29*  CHOLHDL 3.4  VLDL 16  LDLCALC 55   HgbA1c:  Recent Labs  Lab 12/29/20 0314  HGBA1C 4.7*    IMAGING past 24 hours  CT head code stroke wo contrast 12/28/2020  IMPRESSION: 1. No acute intracranial process. 2. Mild bifrontal predominant atrophy. 3. ASPECTS is 10  CT Angio Head Neck code stroke 12/28/2020  IMPRESSION: No large vessel occlusion or aneurysm. Moderate narrowing of the left vertebral artery at the C3-4 level secondary to adjacent osteophytosis.   Mild  narrowing of the left cavernous/supraclinoid ICA, left P1 and right P1/P2 segments.   Right parapharyngeal, parotid and submandibular space lesion measuring 7.4 cm with calcific foci. Differential includes low-flow vascular malformation versus neoplasm.  MR brain 12/29/2020  IMPRESSION: No acute intracranial process.   Mild cerebral atrophy and chronic microvascular ischemic changes.   Right neck lesion, better characterized on recent CTA neck.  Echocardiogram 12/29/2020  IMPRESSIONS     1. Left ventricular ejection fraction, by estimation, is 55 to 60%. The  left ventricle has normal function. The left ventricle has no regional  wall motion abnormalities. There is moderate left ventricular hypertrophy.  Left ventricular diastolic  parameters are consistent with Grade I diastolic dysfunction (impaired  relaxation).   2. Right ventricular systolic function is normal. The right ventricular  size is normal.   3. The mitral valve is normal in structure. No evidence of mitral valve  regurgitation. No evidence of mitral stenosis.   4. The aortic valve has an indeterminant number of cusps. Aortic valve  regurgitation is trivial. No aortic stenosis is present.   5. Aortic dilatation noted. There is  mild dilatation of the aortic root,  measuring 40 mm.   6. The inferior vena cava is normal in size with greater than 50%  respiratory variability, suggesting right atrial pressure of 3 mmHg.     PHYSICAL EXAM General: Well developed, well nourished Caucasian male lying comfortably in bed, NAD HEENT-  Normocephalic and AT. Normal pharynx.  Cardiovascular - regular rate and rhythm Lungs: Normal respiratory effort.  Abd: Soft, nondistended, no tenderness Extremities: warm, well perfused, no peripheral edema Neurological: Mental Status:  Alert, oriented, thought content appropriate.  Speech fluent without evidence of aphasia.  Able to follow 3 step commands without  difficulty. Cranial Nerves: II:  Visual fields grossly normal, pupils equal, round, reactive to light and accommodation III,IV, VI: ptosis not present, extra-ocular motions intact bilaterally V,VII: smile symmetric, facial light touch sensation normal bilaterally VIII: hearing slightly diminished  bilaterally IX,X: uvula rises symmetrically XI: bilateral shoulder shrug XII: midline tongue extension without atrophy or fasciculations  Motor: Right : Upper extremity   5/5    Left:     Upper extremity   5/5  Lower extremity   5/5     Lower extremity   5/5 Tone and bulk:normal tone throughout; no atrophy noted Sensory: Pinprick and light touch normal on in RUE and RLE Deep Tendon Reflexes:  Right: Upper Extremity   Left: Upper extremity   biceps (C-5 to C-6) 2/4   biceps (C-5 to C-6) 2/4 tricep (C7) 2/4    triceps (C7) 2/4 Brachioradialis (C6) 2/4  Brachioradialis (C6) 2/4  Lower Extremity Lower Extremity  quadriceps (L-2 to L-4) 2/4   quadriceps (L-2 to L-4) 2/4 Achilles (S1) 2/4   Achilles (S1) 2/4  Plantars: Right: downgoing   Left: downgoing Cerebellar: normal finger-to-nose,  normal heel-to-shin test Gait: Deferred     ASSESSMENT/PLAN Mr. WINSOR ESS is a 80 y.o. male withPMHx of CAD, MI s/p PTCA of RCA in 2005 with chronic dual antiplatelet therapy, DM II, GERD, HLD, HTN, and COPD who came to the ED today due to HA behind his left eye which radiated to his cheek. It resolved quickly followed by right sided weakness. CT head does not any acute intracranial process along with MRI.  tPA was given as patient was in window 4.5 hours.  Transient ischemic attack likely due to small vessel disease symptoms improved significantly after tPA and close to baseline. Code Stroke CT head: No acute intracranial process.   CTA head & neck: No large vessel occlusion or aneurysm. MRI: No acute intracranial process. 2D Echo: Left ventricular ejection fraction, by estimation, is 55 to 60%.  The  left ventricle has normal function. LDL 55 HgbA1c 4.7 VTE prophylaxis -Lovenox 40 mg    Diet   Diet Heart Room service appropriate? Yes; Fluid consistency: Thin   aspirin 81 mg daily prior to admission, now on aspirin 81 mg daily and clopidogrel 75 mg daily.  Continue DAPT for 3 weeks followed by Plavix 75 mg alone. Therapy recommendations: No follow-up Disposition: Home  Hypertension Home meds: Norvasc 10 mg, lisinopril 10 mg, metoprolol 25 mg Stable, labetalol 10 mg as needed Strict blood pressure control due to tPA. Long-term BP goal normotensive  Hyperlipidemia Home meds: Fenofibrate 160 mg LDL 55, goal < 70 Resumed fenofibrate 160 mg Continue statin at discharge   Other Stroke Risk Factors Advanced Age >/= 75  Coronary artery disease   Other Active Problems GERD: Pantoprazole  Hospital day # 2 Patient is doing well.  He is  neurologically stable for discharge today.  Recommend aspirin and Plavix for 3 weeks followed by Plavix alone and aggressive risk factor modification.  Follow-up as an outpatient stroke clinic in 6 weeks   Antony Contras, [place] Pager: [phone] 12/30/2020 10:21 AM   To contact Stroke Continuity provider, please refer to [email or web address] After hours, contact General Neurology

## 2020-12-30 NOTE — TOC Transition Note (Signed)
Transition of Care [facility]) - CM/SW Discharge Note   Patient Details  Name: Francisco Collier MRN: [id number] Date of Birth: [birth year]  Transition of Care [facility]) CM/SW Contact:  Pollie Friar, RN Phone Number: 12/30/2020, 11:29 AM   Clinical Narrative:    Pt discharging home with self care. No f/u per PT/OT and no DME needs.  Pt has transportation home.   Final next level of care: Home/Self Care Barriers to Discharge: No Barriers Identified   Patient Goals and CMS Choice        Discharge Placement                       Discharge Plan and Services                                     Social Determinants of Health (SDOH) Interventions     Readmission Risk Interventions No flowsheet data found.

## 2020-12-30 NOTE — Plan of Care (Signed)
Adequate for discharge.

## 2020-12-30 NOTE — Discharge Summary (Addendum)
Stroke Discharge Summary  Patient ID: Francisco Collier   MRN: [id number]      DOB: [birth year]  Date of Admission: 12/28/2020 Date of Discharge: 12/30/2020  Attending Physician:  Greta Doom, *, Stroke MD Consultant(s):    None Patient's PCP:  Francisco Roers, MD  DISCHARGE DIAGNOSIS:  1.  Transient ischemic attack Left hemispheric subcortical from small vessel disease s/p IV tPA with complete recovery.  Active Problems:   Stroke [facility])   Allergies as of 12/30/2020       Reactions   Codeine Nausea Only   Pill form causes nausea Iv form does not   Niaspan [niacin Er] Other (See Comments)   Severe flushing   Statins Rash   Headaches         Medication List     STOP taking these medications    aspirin 81 MG tablet Replaced by: aspirin EC 81 MG tablet   HYDROcodone-acetaminophen 5-325 MG tablet Commonly known as: NORCO/VICODIN   lidocaine 5 % Commonly known as: Lidoderm   meclizine 25 MG tablet Commonly known as: ANTIVERT   methocarbamol 500 MG tablet Commonly known as: ROBAXIN   naproxen 375 MG tablet Commonly known as: NAPROSYN   nitroGLYCERIN 0.4 MG SL tablet Commonly known as: NITROSTAT       TAKE these medications    amLODipine 10 MG tablet Commonly known as: NORVASC Take 10 mg by mouth daily.   aspirin EC 81 MG tablet Take 1 tablet (81 mg total) by mouth daily for 21 days. Swallow whole. Replaces: aspirin 81 MG tablet   clopidogrel 75 MG tablet Commonly known as: Plavix Take 1 tablet (75 mg total) by mouth daily.   fenofibrate 160 MG tablet Take 160 mg by mouth daily.   fluticasone 50 MCG/ACT nasal spray Commonly known as: FLONASE Place 1 spray into both nostrils 2 (two) times daily as needed for allergies or rhinitis.   gabapentin 100 MG capsule Commonly known as: NEURONTIN Take 100 mg by mouth daily.   lisinopril 10 MG tablet Commonly known as: Prinivil Take 1 tablet (10 mg total) by mouth daily.   metoprolol  tartrate 25 MG tablet Commonly known as: LOPRESSOR Take 0.5 tablets (12.5 mg total) by mouth 2 (two) times daily.   oxyCODONE-acetaminophen 10-325 MG tablet Commonly known as: PERCOCET Take 1 tablet by mouth 3 (three) times daily as needed for pain.   pantoprazole 20 MG tablet Commonly known as: PROTONIX Take 1 tablet (20 mg total) by mouth daily.   terazosin 1 MG capsule Commonly known as: HYTRIN Take 1 mg by mouth daily.        LABORATORY STUDIES CBC    Component Value Date/Time   WBC 7.8 12/28/2020 1509   RBC 5.18 12/28/2020 1509   HGB 15.6 12/28/2020 1547   HCT 46.0 12/28/2020 1547   PLT 366 12/28/2020 1509   MCV 89.0 12/28/2020 1509   MCH 30.3 12/28/2020 1509   MCHC 34.1 12/28/2020 1509   RDW 13.2 12/28/2020 1509   LYMPHSABS 1.7 12/28/2020 1509   MONOABS 0.6 12/28/2020 1509   EOSABS 0.1 12/28/2020 1509   BASOSABS 0.0 12/28/2020 1509   CMP    Component Value Date/Time   NA 137 12/28/2020 1547   K 4.2 12/28/2020 1547   CL 98 12/28/2020 1547   CO2 26 12/28/2020 1509   GLUCOSE 109 (H) 12/28/2020 1547   BUN 16 12/28/2020 1547   CREATININE 1.20 12/28/2020 1547   CALCIUM 9.5 12/28/2020  1509   PROT 6.7 12/28/2020 1509   ALBUMIN 3.7 12/28/2020 1509   AST 22 12/28/2020 1509   ALT 27 12/28/2020 1509   ALKPHOS 48 12/28/2020 1509   BILITOT 0.7 12/28/2020 1509   GFRNONAA 60 (L) 12/28/2020 1509   GFRAA >60 11/27/2016 2351   COAGS Lab Results  Component Value Date   INR 1.0 12/28/2020   INR 1.08 11/12/2015   INR 1.08 05/02/2013   Lipid Panel    Component Value Date/Time   CHOL 100 12/29/2020 0314   TRIG 79 12/29/2020 0314   HDL 29 (L) 12/29/2020 0314   CHOLHDL 3.4 12/29/2020 0314   VLDL 16 12/29/2020 0314   LDLCALC 55 12/29/2020 0314   HgbA1C  Lab Results  Component Value Date   HGBA1C 4.7 (L) 12/29/2020   Urinalysis    Component Value Date/Time   COLORURINE STRAW (A) 11/27/2016 2333   APPEARANCEUR CLEAR 11/27/2016 2333   LABSPEC 1.002 (L)  11/27/2016 2333   PHURINE 7.0 11/27/2016 2333   GLUCOSEU NEGATIVE 11/27/2016 2333   HGBUR NEGATIVE 11/27/2016 2333   BILIRUBINUR NEGATIVE 11/27/2016 2333   KETONESUR NEGATIVE 11/27/2016 2333   PROTEINUR NEGATIVE 11/27/2016 2333   UROBILINOGEN 1.0 09/12/2015 1658   NITRITE NEGATIVE 11/27/2016 2333   LEUKOCYTESUR NEGATIVE 11/27/2016 2333   Urine Drug Screen     Component Value Date/Time   LABOPIA NONE DETECTED 12/29/2020 0807   COCAINSCRNUR NONE DETECTED 12/29/2020 0807   LABBENZ NONE DETECTED 12/29/2020 0807   AMPHETMU NONE DETECTED 12/29/2020 0807   THCU NONE DETECTED 12/29/2020 0807   LABBARB NONE DETECTED 12/29/2020 0807    Alcohol Level    Component Value Date/Time   [facility]  12/29/2008 0510    <5        LOWEST DETECTABLE LIMIT FOR SERUM ALCOHOL IS 5 mg/dL FOR MEDICAL PURPOSES ONLY     SIGNIFICANT DIAGNOSTIC STUDIES  CT head code stroke wo contrast 12/28/2020   IMPRESSION: 1. No acute intracranial process. 2. Mild bifrontal predominant atrophy. 3. ASPECTS is 10   CT Angio Head Neck code stroke 12/28/2020   IMPRESSION: No large vessel occlusion or aneurysm. Moderate narrowing of the left vertebral artery at the C3-4 level secondary to adjacent osteophytosis.   Mild narrowing of the left cavernous/supraclinoid ICA, left P1 and right P1/P2 segments.   Right parapharyngeal, parotid and submandibular space lesion measuring 7.4 cm with calcific foci. Differential includes low-flow vascular malformation versus neoplasm.   MR brain 12/29/2020   IMPRESSION: No acute intracranial process.   Mild cerebral atrophy and chronic microvascular ischemic changes.   Right neck lesion, better characterized on recent CTA neck.   Echocardiogram 12/29/2020   IMPRESSIONS     1. Left ventricular ejection fraction, by estimation, is 55 to 60%. The  left ventricle has normal function. The left ventricle has no regional  wall motion abnormalities. There is moderate left  ventricular hypertrophy.  Left ventricular diastolic  parameters are consistent with Grade I diastolic dysfunction (impaired  relaxation).   2. Right ventricular systolic function is normal. The right ventricular  size is normal.   3. The mitral valve is normal in structure. No evidence of mitral valve  regurgitation. No evidence of mitral stenosis.   4. The aortic valve has an indeterminant number of cusps. Aortic valve  regurgitation is trivial. No aortic stenosis is present.   5. Aortic dilatation noted. There is mild dilatation of the aortic root,  measuring 40 mm.   6. The inferior vena cava is  normal in size with greater than 50%  respiratory variability, suggesting right atrial pressure of 3 mmHg.        HISTORY OF PRESENT ILLNESS HPI: Francisco ROUGHTON is an 80 y.o. male with a PMHx of CAD, MI s/p PTCA of RCA in 2005 with chronic dual antiplatelet therapy, DM II, GERD, HLD, HTN, and COPD who came to the ED today due to HA behind his left eye which radiated to his cheek. He was placed in triage. However, at 1300 hrs, he experienced acute onset of right sided weakness and sensory deficit. CODE STROKE was initiated. No personal or FMHx of stroke.    In review of chart, his last admission here was 2016 for unstable angina without worsening of CAD. He has been on Plavix 75mg  po qd and ASA 81mg  po qd per cardiology. NP does not see any outpatient cardiology notes.    BP 105/67 in triage. CBG 115.    Exam in CT showed Right facial droop, right UE/LE drift with 4/5 strength on right side, and sensory deficits to RUE/RLE. CT head was negative for acute infarct or bleeding. CTA head and neck negative for LVO. tPA was consented to by patient and was given.    Pt will be admitted to ICU under post tPA protocol.   HOSPITAL COURSE Mr. GIB AYLSWORTH is a 80 y.o. male withPMHx of CAD, MI s/p PTCA of RCA in 2005 with  DM II, GERD, HLD, HTN, and COPD who came to the ED due to HA behind his left  eye which radiated to his cheek. It resolved quickly followed by right sided weakness. CT head does not any acute intracranial process along with MRI, which was negative for acute stroke.  tPA was given as patient was in window 4.5 hours. Post tPA patient was admitted to ICU for strict blood pressure control and then transferred to progressive unit.  PT/OT worked with patient and they suggested no follow-up and patient is stable enough to go home and ambulate by himself. On the day of discharge he was explained DAPT for 3 weeks with aspirin 81 mg and Plavix 75 mg.  After that stop aspirin and continue with Plavix only.  Also counseled for strict blood pressure control, diet full of fruits and vegetables,  avoiding red meat.  He will follow-up with neurology in 6 weeks.   RN Pressure Injury Documentation: N/A     DISCHARGE EXAM Blood pressure 129/71, pulse 78, temperature 97.8 F (36.6 C), temperature source Oral, resp. rate 16, height 6\' 2"  (1.88 m), weight 101.9 kg, SpO2 91 %.  PHYSICAL EXAM General: Well developed, well nourished Caucasian male lying comfortably in bed, NAD HEENT-  Normocephalic and AT. Normal pharynx.  Cardiovascular - regular rate and rhythm Lungs: Normal respiratory effort.  Abd: Soft, nondistended, no tenderness Extremities: warm, well perfused, no peripheral edema Neurological: Mental Status:  Alert, oriented, thought content appropriate.  Speech fluent without evidence of aphasia.  Able to follow 3 step commands without difficulty. Cranial Nerves: II:  Visual fields grossly normal, pupils equal, round, reactive to light and accommodation III,IV, VI: ptosis not present, extra-ocular motions intact bilaterally V,VII: smile symmetric, facial light touch sensation normal bilaterally VIII: hearing slightly diminished  bilaterally IX,X: uvula rises symmetrically XI: bilateral shoulder shrug XII: midline tongue extension without atrophy or fasciculations    Motor: Right :  Upper extremity   5/5  Left:     Upper extremity   5/5             Lower extremity   5/5                                                  Lower extremity   5/5 Tone and bulk:normal tone throughout; no atrophy noted Sensory: Pinprick and light touch normal on in RUE and RLE Deep Tendon Reflexes:  Right: Upper Extremity                       Left: Upper extremity    biceps (C-5 to C-6) 2/4                       biceps (C-5 to C-6) 2/4 tricep (C7) 2/4                                     triceps (C7) 2/4 Brachioradialis (C6) 2/4                      Brachioradialis (C6) 2/4   Lower Extremity Lower Extremity  quadriceps (L-2 to L-4) 2/4                 quadriceps (L-2 to L-4) 2/4 Achilles (S1) 2/4                                  Achilles (S1) 2/4   Plantars: Right: downgoing                                Left: downgoing Cerebellar: normal finger-to-nose,  normal heel-to-shin test Gait: Deferred    Discharge Diet       Diet   Diet Heart Room service appropriate? Yes; Fluid consistency: Thin   liquids  DISCHARGE PLAN  Disposition:  Home  aspirin 81 mg daily and clopidogrel 75 mg daily for secondary stroke prevention for 3 weeks then Plavix 75 mg alone.  Ongoing stroke risk factor control by Primary Care Physician at time of discharge  Follow-up PCP Francisco Roers, MD in 2 weeks.  Follow-up in [facility] in 4 weeks, office to schedule an appointment.    40minutes were spent preparing discharge. I have personally obtained history,examined this patient, reviewed notes, independently viewed imaging studies, participated in medical decision making and plan of care.ROS completed by me personally and pertinent positives fully documented  I have made any additions or clarifications directly to the above note. Agree with note above.    Antony Contras, MD Medical Director [facility] Pager: [phone] 12/30/2020 1:43 PM

## 2020-12-30 NOTE — Plan of Care (Signed)
  Problem: Education: Goal: Knowledge of patient specific risk factors addressed and post discharge goals established will improve Outcome: Progressing Goal: Individualized Educational Video(s) Outcome: Progressing   Problem: Coping: Goal: Will verbalize positive feelings about self Outcome: Progressing Goal: Will identify appropriate support needs Outcome: Progressing   Problem: Health Behavior/Discharge Planning: Goal: Ability to manage health-related needs will improve Outcome: Progressing   Problem: Self-Care: Goal: Ability to participate in self-care as condition permits will improve Outcome: Progressing Goal: Verbalization of feelings and concerns over difficulty with self-care will improve Outcome: Progressing Goal: Ability to communicate needs accurately will improve Outcome: Progressing   Problem: Nutrition: Goal: Risk of aspiration will decrease Outcome: Progressing Goal: Dietary intake will improve Outcome: Progressing   Problem: Education: Goal: Knowledge of secondary prevention will improve Outcome: Progressing

## 2021-01-18 ENCOUNTER — Emergency Department: Payer: Medicare HMO

## 2021-01-18 ENCOUNTER — Observation Stay
Admission: EM | Admit: 2021-01-18 | Discharge: 2021-01-19 | Disposition: A | Discharge status: Home / Self Care | Payer: Medicare HMO | Attending: Hospitalist | Admitting: Hospitalist

## 2021-01-18 ENCOUNTER — Encounter: Payer: Self-pay | Admitting: Emergency Medicine

## 2021-01-18 ENCOUNTER — Observation Stay: Payer: Medicare HMO

## 2021-01-18 ENCOUNTER — Other Ambulatory Visit: Payer: Self-pay

## 2021-01-18 DIAGNOSIS — J449 Chronic obstructive pulmonary disease, unspecified: Secondary | ICD-10-CM | POA: Diagnosis not present

## 2021-01-18 DIAGNOSIS — Z20822 Contact with and (suspected) exposure to covid-19: Secondary | ICD-10-CM | POA: Insufficient documentation

## 2021-01-18 DIAGNOSIS — I252 Old myocardial infarction: Secondary | ICD-10-CM | POA: Diagnosis not present

## 2021-01-18 DIAGNOSIS — Z7982 Long term (current) use of aspirin: Secondary | ICD-10-CM | POA: Diagnosis not present

## 2021-01-18 DIAGNOSIS — I10 Essential (primary) hypertension: Secondary | ICD-10-CM | POA: Diagnosis present

## 2021-01-18 DIAGNOSIS — Z79899 Other long term (current) drug therapy: Secondary | ICD-10-CM | POA: Diagnosis not present

## 2021-01-18 DIAGNOSIS — M4802 Spinal stenosis, cervical region: Secondary | ICD-10-CM | POA: Diagnosis not present

## 2021-01-18 DIAGNOSIS — K219 Gastro-esophageal reflux disease without esophagitis: Secondary | ICD-10-CM | POA: Insufficient documentation

## 2021-01-18 DIAGNOSIS — Z8673 Personal history of transient ischemic attack (TIA), and cerebral infarction without residual deficits: Secondary | ICD-10-CM | POA: Diagnosis not present

## 2021-01-18 DIAGNOSIS — M25511 Pain in right shoulder: Secondary | ICD-10-CM

## 2021-01-18 DIAGNOSIS — M542 Cervicalgia: Secondary | ICD-10-CM

## 2021-01-18 DIAGNOSIS — Z87891 Personal history of nicotine dependence: Secondary | ICD-10-CM | POA: Diagnosis not present

## 2021-01-18 DIAGNOSIS — M48061 Spinal stenosis, lumbar region without neurogenic claudication: Secondary | ICD-10-CM | POA: Insufficient documentation

## 2021-01-18 DIAGNOSIS — R42 Dizziness and giddiness: Principal | ICD-10-CM

## 2021-01-18 DIAGNOSIS — I2510 Atherosclerotic heart disease of native coronary artery without angina pectoris: Secondary | ICD-10-CM | POA: Diagnosis not present

## 2021-01-18 LAB — CBC
HCT: 45.8 % (ref 39.0–52.0)
Hemoglobin: 15.2 g/dL (ref 13.0–17.0)
MCH: 29.2 pg (ref 26.0–34.0)
MCHC: 33.2 g/dL (ref 30.0–36.0)
MCV: 88.1 fL (ref 80.0–100.0)
Platelets: 345 10*3/uL (ref 150–400)
RBC: 5.2 MIL/uL (ref 4.22–5.81)
RDW: 13.2 % (ref 11.5–15.5)
WBC: 8.9 10*3/uL (ref 4.0–10.5)
nRBC: 0 % (ref 0.0–0.2)

## 2021-01-18 LAB — URINALYSIS, COMPLETE (UACMP) WITH MICROSCOPIC
Bacteria, UA: NONE SEEN
Bilirubin Urine: NEGATIVE
Glucose, UA: NEGATIVE mg/dL
Hgb urine dipstick: NEGATIVE
Ketones, ur: NEGATIVE mg/dL
Leukocytes,Ua: NEGATIVE
Nitrite: NEGATIVE
Protein, ur: NEGATIVE mg/dL
Specific Gravity, Urine: 1.017 (ref 1.005–1.030)
Squamous Epithelial / HPF: NONE SEEN (ref 0–5)
pH: 5 (ref 5.0–8.0)

## 2021-01-18 LAB — BASIC METABOLIC PANEL
Anion gap: 8 (ref 5–15)
BUN: 16 mg/dL (ref 8–23)
CO2: 26 mmol/L (ref 22–32)
Calcium: 9.1 mg/dL (ref 8.9–10.3)
Chloride: 102 mmol/L (ref 98–111)
Creatinine, Ser: 1.01 mg/dL (ref 0.61–1.24)
GFR, Estimated: >60 mL/min (ref >60)
Glucose, Bld: 119 mg/dL — ABNORMAL HIGH (ref 70–99)
Potassium: 3.9 mmol/L (ref 3.5–5.1)
Sodium: 136 mmol/L (ref 135–145)

## 2021-01-18 LAB — SARS CORONAVIRUS 2 (TAT 6-24 HRS): SARS Coronavirus 2: NEGATIVE

## 2021-01-18 MED ORDER — FENOFIBRATE 160 MG PO TABS
160.0000 mg | ORAL_TABLET | Freq: Every day | ORAL | Status: DC
Start: 2021-01-18 — End: 2021-01-19
  Administered 2021-01-18 – 2021-01-19 (×2): 160 mg via ORAL
  Filled 2021-01-18 (×2): qty 1

## 2021-01-18 MED ORDER — MECLIZINE HCL 25 MG PO TABS
25.0000 mg | ORAL_TABLET | Freq: Once | ORAL | Status: AC
  Administered 2021-01-18: 25 mg via ORAL
  Filled 2021-01-18: qty 1

## 2021-01-18 MED ORDER — LORAZEPAM 2 MG/ML IJ SOLN
1.0000 mg | Freq: Once | INTRAMUSCULAR | Status: AC
  Administered 2021-01-18: 1 mg via INTRAVENOUS
  Filled 2021-01-18: qty 1

## 2021-01-18 MED ORDER — SODIUM CHLORIDE 0.9 % IV SOLN: INTRAVENOUS | Status: DC

## 2021-01-18 MED ORDER — ASPIRIN EC 81 MG PO TBEC
81.0000 mg | DELAYED_RELEASE_TABLET | Freq: Every day | ORAL | Status: DC
  Administered 2021-01-18 – 2021-01-19 (×2): 81 mg via ORAL
  Filled 2021-01-18 (×2): qty 1

## 2021-01-18 MED ORDER — MECLIZINE HCL 25 MG PO TABS
25.0000 mg | ORAL_TABLET | Freq: Three times a day (TID) | ORAL | Status: DC | PRN
  Filled 2021-01-18: qty 1

## 2021-01-18 MED ORDER — METOPROLOL TARTRATE 25 MG PO TABS
12.5000 mg | ORAL_TABLET | Freq: Two times a day (BID) | ORAL | Status: DC
  Administered 2021-01-18 – 2021-01-19 (×3): 12.5 mg via ORAL
  Filled 2021-01-18 (×3): qty 1

## 2021-01-18 MED ORDER — ACETAMINOPHEN 325 MG PO TABS: 325.0000 mg | ORAL_TABLET | Freq: Three times a day (TID) | ORAL | Status: DC | PRN

## 2021-01-18 MED ORDER — FLUTICASONE PROPIONATE 50 MCG/ACT NA SUSP
1.0000 | Freq: Two times a day (BID) | NASAL | Status: DC | PRN
  Filled 2021-01-18: qty 16

## 2021-01-18 MED ORDER — CLOPIDOGREL BISULFATE 75 MG PO TABS
75.0000 mg | ORAL_TABLET | Freq: Every day | ORAL | Status: DC
  Administered 2021-01-18 – 2021-01-19 (×2): 75 mg via ORAL
  Filled 2021-01-18 (×2): qty 1

## 2021-01-18 MED ORDER — PANTOPRAZOLE SODIUM 20 MG PO TBEC
20.0000 mg | DELAYED_RELEASE_TABLET | Freq: Every day | ORAL | Status: DC
  Administered 2021-01-18 – 2021-01-19 (×2): 20 mg via ORAL
  Filled 2021-01-18 (×2): qty 1

## 2021-01-18 MED ORDER — OXYCODONE-ACETAMINOPHEN 10-325 MG PO TABS: 1.0000 | ORAL_TABLET | Freq: Three times a day (TID) | ORAL | Status: DC | PRN

## 2021-01-18 MED ORDER — SODIUM CHLORIDE 0.9 % IV BOLUS (SEPSIS)
500.0000 mL | Freq: Once | INTRAVENOUS | Status: AC
  Administered 2021-01-18: 500 mL via INTRAVENOUS

## 2021-01-18 MED ORDER — ENOXAPARIN SODIUM 40 MG/0.4ML ~~LOC~~ SOLN
40.0000 mg | SUBCUTANEOUS | Status: DC
  Administered 2021-01-19: 40 mg via SUBCUTANEOUS
  Filled 2021-01-18: qty 0.4

## 2021-01-18 MED ORDER — OXYCODONE HCL 5 MG PO TABS: 10.0000 mg | ORAL_TABLET | Freq: Three times a day (TID) | ORAL | Status: DC | PRN

## 2021-01-18 MED ORDER — TERAZOSIN HCL 1 MG PO CAPS
1.0000 mg | ORAL_CAPSULE | Freq: Every day | ORAL | Status: DC
  Administered 2021-01-19: 1 mg via ORAL
  Filled 2021-01-18 (×3): qty 1

## 2021-01-18 MED ORDER — GABAPENTIN 100 MG PO CAPS
100.0000 mg | ORAL_CAPSULE | Freq: Every day | ORAL | Status: DC
  Administered 2021-01-18 – 2021-01-19 (×2): 100 mg via ORAL
  Filled 2021-01-18 (×2): qty 1

## 2021-01-18 MED ORDER — ONDANSETRON HCL 4 MG/2ML IJ SOLN: 4.0000 mg | Freq: Four times a day (QID) | INTRAMUSCULAR | Status: DC | PRN

## 2021-01-18 MED ORDER — LISINOPRIL 10 MG PO TABS: 10.0000 mg | ORAL_TABLET | Freq: Every day | ORAL | Status: DC

## 2021-01-18 MED ORDER — SODIUM CHLORIDE 0.9 % IV SOLN: INTRAVENOUS | Status: AC

## 2021-01-18 MED ORDER — AMLODIPINE BESYLATE 5 MG PO TABS
10.0000 mg | ORAL_TABLET | Freq: Every day | ORAL | Status: DC
  Administered 2021-01-18 – 2021-01-19 (×2): 10 mg via ORAL
  Filled 2021-01-18: qty 2

## 2021-01-18 NOTE — ED Notes (Signed)
MRI at bedside for screening. X-ray at bedside for imaging.

## 2021-01-18 NOTE — ED Notes (Signed)
ED Provider at bedside. 

## 2021-01-18 NOTE — Plan of Care (Signed)

## 2021-01-18 NOTE — H&P (Signed)
History and Physical    Francisco Collier:[id number] DOB: Apr 28, 1941 DOA: 01/18/2021  PCP: Tamsen Roers, MD   Patient coming from: Home  I have personally briefly reviewed patient's old medical records in [place]  Chief Complaint: Dizziness  HPI: Francisco Collier is a 80 y.o. male with medical history significant for coronary artery disease, COPD, hypertension, dyslipidemia, recent acute CVA status post TPA who presents to the ER for evaluation of dizziness.  Patient was in his usual state of health when he went to bed at about 10:30 PM.  He woke up just before midnight and felt like the room was spinning.  This was associated with fullness and a ringing sensation in both ears.  He denies having any known aggravating or relieving factors and no worsening symptoms with movement of his head from side to side.  Patient was unable to ambulate due to his symptoms. He has a known history of vertigo but did not take any medicines prior to calling EMS.   He denies having any nausea or vomiting, he has no headache, no chest pain, no shortness of breath, no fever, no chills, no cough, no abdominal pain, no diarrhea, no constipation, no urinary frequency, no nocturia, no dysuria, no palpitations or diaphoresis. Labs show sodium 136, potassium 3.9, chloride 102, bicarb 26, BUN 16, creatinine 1.01, calcium 9.1, white count 8.9, hemoglobin 15.2, hematocrit 45.8, MCV 88.1, RDW 13.2, platelet count 345, PT 12.3, INR 1.0 His SARS coronavirus 2 PCR test is pending MRI of the brain without contrast shows no acute intracranial abnormality. Stable noncontrast MRI appearance of the brain since 2016 with mild to moderate for age chronic small vessel disease. 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 This was described on neck CTA last month, but has been present since 2007 in keeping with benign or indolent process.    ED Course: Patient is a 80 year old  male who presents to the ER for evaluation of vertigo which woke him up out of his sleep.  He describes it as feeling like the room was spinning associated with fullness and ringing sensation in both ears.  Patient was unable to ambulate due to his symptoms.  He will be referred to observation status for further evaluation.  Review of Systems: As per HPI otherwise all systems reviewed and negative.    Past Medical History:  Diagnosis Date  . Arthritis    back  . CAD (coronary artery disease)   . COPD (chronic obstructive pulmonary disease) ([place])   . Diverticulitis   . Esophageal stricture   . Gastric ulcer   . GERD (gastroesophageal reflux disease)   . Hyperlipemia   . Hypertension   . Kidney stone   . MI (myocardial infarction) ([place]) 2005  . Prostatic hypertrophy   . S/P orchiectomy   . Shortness of breath     Past Surgical History:  Procedure Laterality Date  . ANGIOPLASTY  2005   Stent  . APPENDECTOMY    . BACK SURGERY    . CARDIAC CATHETERIZATION N/A 11/12/2015   Procedure: Left Heart Cath and Coronary Angiography;  Surgeon: Charolette Forward, MD;  Location: [facility];  Service: Cardiovascular;  Laterality: N/A;  . CHOLECYSTECTOMY    . CHOLECYSTECTOMY    . CORONARY ANGIOPLASTY    . ESOPHAGOGASTRODUODENOSCOPY N/A 10/06/2020   Procedure: ESOPHAGOGASTRODUODENOSCOPY (EGD);  Surgeon: Jerene Bears, MD;  Location: [facility];  Service: Gastroenterology;  Laterality: N/A;  .  FOREIGN BODY REMOVAL  10/06/2020   Procedure: FOREIGN BODY REMOVAL;  Surgeon: Jerene Bears, MD;  Location: [facility];  Service: Gastroenterology;;  . THYROID SURGERY       reports that he quit smoking about 22 years ago. He quit after 40.00 years of use. He has never used smokeless tobacco. He reports that he does not drink alcohol and does not use drugs.  Allergies  Allergen Reactions  . Codeine Nausea Only    Pill form causes nausea Iv form does not  . Niaspan [Niacin Er] Other (See  Comments)    Severe flushing  . Statins Rash    Headaches     Family History  Problem Relation Age of Onset  . Lung cancer Father   . Colon cancer Neg Hx   . Migraines Neg Hx      Prior to Admission medications   Medication Sig Start Date End Date Taking? Authorizing Provider  amLODipine (NORVASC) 10 MG tablet Take 10 mg by mouth daily. 10/28/20   [provider]  aspirin EC 81 MG tablet Take 1 tablet (81 mg total) by mouth daily for 21 days. Swallow whole. 12/30/20 01/20/21  Dagar, Meredith Staggers, MD  clopidogrel (PLAVIX) 75 MG tablet Take 1 tablet (75 mg total) by mouth daily. 12/30/20 12/30/21  Dagar, Meredith Staggers, MD  fenofibrate 160 MG tablet Take 160 mg by mouth daily.    [provider]  fluticasone (FLONASE) 50 MCG/ACT nasal spray Place 1 spray into both nostrils 2 (two) times daily as needed for allergies or rhinitis.     [provider]  gabapentin (NEURONTIN) 100 MG capsule Take 100 mg by mouth daily. 12/23/20   [provider]  lisinopril (PRINIVIL) 10 MG tablet Take 1 tablet (10 mg total) by mouth daily. 11/13/15   Charolette Forward, MD  metoprolol tartrate (LOPRESSOR) 25 MG tablet Take 0.5 tablets (12.5 mg total) by mouth 2 (two) times daily. 11/13/15   Charolette Forward, MD  oxyCODONE-acetaminophen (PERCOCET) 10-325 MG tablet Take 1 tablet by mouth 3 (three) times daily as needed for pain. 12/23/20   [provider]  pantoprazole (PROTONIX) 20 MG tablet Take 1 tablet (20 mg total) by mouth daily. 10/06/20   Isla Pence, MD  terazosin (HYTRIN) 1 MG capsule Take 1 mg by mouth daily. 11/23/20   [provider]    Physical Exam: Vitals:   01/18/21 0312 01/18/21 0400 01/18/21 0500 01/18/21 0551  BP: 110/71 123/68 127/74 130/78  Pulse: 78 80 81 88  Resp: 16 18 16 18   Temp: 98 F (36.7 C)   98 F (36.7 C)  TempSrc: Oral   Oral  SpO2: 97% 97% 95% 97%  Weight:      Height:         Vitals:   01/18/21 0312 01/18/21 0400 01/18/21 0500  01/18/21 0551  BP: 110/71 123/68 127/74 130/78  Pulse: 78 80 81 88  Resp: 16 18 16 18   Temp: 98 F (36.7 C)   98 F (36.7 C)  TempSrc: Oral   Oral  SpO2: 97% 97% 95% 97%  Weight:      Height:        Constitutional: Sleeping but arouses easily Eyes: PERRL, lids and conjunctivae normal ENMT: Mucous membranes are moist.  Neck: normal, supple, no masses, no thyromegaly Respiratory: clear to auscultation bilaterally, no wheezing, no crackles. Normal respiratory effort. No accessory muscle use.  Cardiovascular: Regular rate and rhythm, no murmurs / rubs / gallops. No extremity edema.  2+ pedal pulses. No carotid bruits.  Abdomen: no tenderness, no masses palpated. No hepatosplenomegaly. Bowel sounds positive.  Musculoskeletal: no clubbing / cyanosis. No joint deformity upper and lower extremities.  Skin: no rashes, lesions, ulcers.  Neurologic: No gross focal neurologic deficit.  Mild weakness involving his right upper and lower extremity related to his recent stroke Psychiatric: Normal mood and affect.   Labs on Admission: I have personally reviewed following labs and imaging studies  CBC: Recent Labs  Lab 01/18/21 0116  WBC 8.9  HGB 15.2  HCT 45.8  MCV 88.1  PLT 345   Basic Metabolic Panel: Recent Labs  Lab 01/18/21 0116  NA 136  K 3.9  CL 102  CO2 26  GLUCOSE 119*  BUN 16  CREATININE 1.01  CALCIUM 9.1   GFR: Estimated Creatinine Clearance: 78.1 mL/min (by C-G formula based on SCr of 1.01 mg/dL). Liver Function Tests: No results for input(s): AST, ALT, ALKPHOS, BILITOT, PROT, ALBUMIN in the last 168 hours. No results for input(s): LIPASE, AMYLASE in the last 168 hours. No results for input(s): AMMONIA in the last 168 hours. Coagulation Profile: No results for input(s): INR, PROTIME in the last 168 hours. Cardiac Enzymes: No results for input(s): CKTOTAL, CKMB, CKMBINDEX, TROPONINI in the last 168 hours. BNP (last 3 results) No results for input(s): PROBNP in  the last 8760 hours. HbA1C: No results for input(s): HGBA1C in the last 72 hours. CBG: No results for input(s): GLUCAP in the last 168 hours. Lipid Profile: No results for input(s): CHOL, HDL, LDLCALC, TRIG, CHOLHDL, LDLDIRECT in the last 72 hours. Thyroid Function Tests: No results for input(s): TSH, T4TOTAL, FREET4, T3FREE, THYROIDAB in the last 72 hours. Anemia Panel: No results for input(s): VITAMINB12, FOLATE, FERRITIN, TIBC, IRON, RETICCTPCT in the last 72 hours. Urine analysis:    Component Value Date/Time   COLORURINE YELLOW (A) 01/18/2021 0529   APPEARANCEUR CLEAR (A) 01/18/2021 0529   LABSPEC 1.017 01/18/2021 0529   PHURINE 5.0 01/18/2021 0529   GLUCOSEU NEGATIVE 01/18/2021 0529   HGBUR NEGATIVE 01/18/2021 0529   BILIRUBINUR NEGATIVE 01/18/2021 0529   KETONESUR NEGATIVE 01/18/2021 0529   PROTEINUR NEGATIVE 01/18/2021 0529   UROBILINOGEN 1.0 09/12/2015 1658   NITRITE NEGATIVE 01/18/2021 0529   LEUKOCYTESUR NEGATIVE 01/18/2021 0529    Radiological Exams on Admission: DG Shoulder Right  Result Date: 01/18/2021 CLINICAL DATA:  Right shoulder pain.  No known injury. EXAM: RIGHT SHOULDER - 2+ VIEW COMPARISON:  None. FINDINGS: Degenerative changes in the right [facility] and glenohumeral joint, most pronounced in the [facility] joint. Loss of subacromial space likely related to chronic rotator cuff disease. No acute bony abnormality. Specifically, no fracture, subluxation, or dislocation. IMPRESSION: Degenerative changes in the right shoulder. No acute bony abnormality. Electronically Signed   By: Charlett Nose M.D.   On: 01/18/2021 03:52   CT Head Wo Contrast  Result Date: 01/18/2021 CLINICAL DATA:  Dizziness EXAM: CT HEAD WITHOUT CONTRAST TECHNIQUE: Contiguous axial images were obtained from the base of the skull through the vertex without intravenous contrast. COMPARISON:  12/28/2020 FINDINGS: Brain: Mild atrophy, most pronounced in the frontal lobes. No acute intracranial abnormality.  Specifically, no hemorrhage, hydrocephalus, mass lesion, acute infarction, or significant intracranial injury. Vascular: No hyperdense vessel or unexpected calcification. Skull: No acute calvarial abnormality. Sinuses/Orbits: No acute findings Other: None IMPRESSION: Cerebral atrophy, most pronounced in the frontal lobes. No acute intracranial abnormality. Electronically Signed   By: Charlett Nose M.D.   On: 01/18/2021 02:51   MR  BRAIN WO CONTRAST  Result Date: 01/18/2021 CLINICAL DATA:  80 year old male with dizziness. EXAM: MRI HEAD WITHOUT CONTRAST TECHNIQUE: Multiplanar, multiecho pulse sequences of the brain and surrounding structures were obtained without intravenous contrast. COMPARISON:  Brain MRI 12/29/2020 and earlier. Head CT earlier today. Brain MRI 12/31/2008. FINDINGS: Brain: No restricted diffusion to suggest acute infarction. No midline shift, mass effect, evidence of mass lesion, ventriculomegaly, extra-axial collection or acute intracranial hemorrhage. Cervicomedullary junction and pituitary are within normal limits. Stable cerebral volume. Patchy and scattered cerebral white matter T2 and FLAIR hyperintensity is stable and mild to moderate for age. No cortical encephalomalacia or chronic cerebral blood products identified. Small T2 hyperintense foci in the deep gray nuclei are compatible with small chronic lacunar infarcts, including in the right thalamus. Negative brainstem and cerebellum. Vascular: Major intracranial vascular flow voids are stable. Mild generalized intracranial artery tortuosity. Skull and upper cervical spine: No acute osseous abnormality identified. Sinuses/Orbits: Stable, negative. Other: Visible internal auditory structures appear normal. Normal stylomastoid foramina. There is a chronic partially calcified soft tissue mass occupying both the right parotid and right parapharyngeal spaces. This was described on neck CTA last month, but has been present since 2010 (and  described at that time as present in 2007), such that benign or indolent neoplasm is favored. IMPRESSION: 1. No acute intracranial abnormality. 2. Stable noncontrast MRI appearance of the brain since 2016 with mild to moderate for age chronic small vessel disease. 3. 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 This was described on neck CTA last month, but has been present since 2007 in keeping with benign or indolent process. Electronically Signed   By: Genevie Ann M.D.   On: 01/18/2021 05:14    EKG: Independently reviewed.  Normal sinus rhythm  Assessment/Plan Principal Problem:   Acute severe vertigo Active Problems:   History of CVA (cerebrovascular accident)   Hypertension     Acute severe vertigo  Symptom control  Place patient on meclizine PT consult We will request neurology consult    History of recent CVA With mild right-sided weakness Continue dual antiplatelet therapy    Hypertension Continue amlodipine and metoprolol    GERD Continue Protonix     DVT prophylaxis: Lovenox Code Status: Full code Family Communication: Greater than 50% of time was spent discussing patient's condition and plan of care with patient at the bedside.  All questions and concerns have been addressed.  He verbalizes understanding and agrees with the plan. Disposition Plan: Back to previous home environment Consults called: Neurology    Collier Bullock MD [facility]     01/18/2021, 10:05 AM

## 2021-01-18 NOTE — ED Notes (Signed)
ED Provider at bedside with update on plan for admission.

## 2021-01-18 NOTE — ED Notes (Signed)
Patient able to tolerate fluids without nausea/vomiting. Passed PO challenge. Patient unable to ambulate without dizziness. Pt reports severe dizziness after taking a few steps. Placed back in bed and on monitor. MD aware.

## 2021-01-18 NOTE — ED Notes (Signed)
Patient transported to MRI 

## 2021-01-18 NOTE — Evaluation (Signed)
Physical Therapy Evaluation Patient Details Name: Francisco Collier MRN: [id number] DOB: May 29, 1941 Today's Date: 01/18/2021   History of Present Illness  Pt is a 80 year old male that presented to the ED for sudden onset, spontaneous dizziness. PMH includes recent  TPA for CVA in January. Current workup and imaging unremarkable, no acute findings. PMHx of CAD, MI s/p PTCA of RCA in 2005 with chronic dual antiplatelet therapy, DM II, GERD, HLD, HTN, and COPD.    Clinical Impression  Pt sleeping, easily wakes at start of session, denied pain. Pt without complaints of dizziness this AM and throughout session. Pt seen with neurology for assessment. Pt demonstrated bed mobility several times, CGA/modI. Sit <> stand from EOB as well as standard commode (with use of grab bar) CGA. He ambulated ~173ft with handheld assist, no LOB noted but decreased gait velocity. Overall the patient demonstrated mild deficits compared to PLOF (prior to January 2022), and could benefit from outpatient therapy to maximize function, safety, and balance.   Cognition:The pt is alert and oriented x4, pleasant, conversational, and following simple and multi-step commands consistently. Strength: MMT grossly 4+/5 for LEs, difficulty assessing RUE due to acute shoulder pain. Pt also with complaints of neck pain with R rotation Joint Proprioception: deferred  Light Touch Sensation: grossly WFLs, pt did report R thigh numbness that has been present since last hospital admission.  Eye Tracking: smooth pursuits intact left/right;  saccadic movements: WFLs Balance: Pt able to ambulate with handheld assist, light minA for heel/toe walking Speech: WNL   Orthostatic vitals assessed, WFLs but may have had a significant change in diastolic BP, see vitals flowsheets for details.  Pt also screened for vestibular involvement. Pt without complaints of dizziness with bed mobility. L VOR questionable impairment due to pt varying test results.  Dix hallpike testing negative bilaterally. Pt hearing WFLs, denied head trauma.  OCULOMOTOR / VESTIBULAR TESTING:  Oculomotor Exam- Room Light  Findings Comments  Ocular Alignment normal   Ocular ROM normal   Spontaneous Nystagmus normal   Gaze-Holding Nystagmus normal   End-Gaze Nystagmus normal   Vergence (normal 2-3") normal   Smooth Pursuit normal   Cross-Cover Test normal   Saccades not examined   VOR Cancellation not examined   Left Head Impulse unclear results   Right Head Impulse normal    BPPV TESTS: Dix-hallpike negative, roll test not assessed.      Follow Up Recommendations Outpatient PT    Equipment Recommendations  None recommended by PT    Recommendations for Other Services       Precautions / Restrictions Precautions Precautions: Fall Restrictions Weight Bearing Restrictions: No      Mobility  Bed Mobility Overal bed mobility: Modified Independent                  Transfers Overall transfer level: Needs assistance Equipment used: 1 person hand held assist Transfers: Sit to/from Stand Sit to Stand: Supervision;Min guard         General transfer comment: handheld assist provided, pt uses SPC at baseline  Ambulation/Gait Ambulation/Gait assistance: Min guard Gait Distance (Feet): 170 Feet Assistive device: 1 person hand held assist       General Gait Details: pt able to ambulate with handheld assist, no unsteadiness noted, decreased velocity  Stairs            Wheelchair Mobility    Modified Rankin (Stroke Patients Only)       Balance Overall balance assessment: Needs assistance  Sitting balance-Leahy Scale: Good     Standing balance support: Single extremity supported;No upper extremity supported;During functional activity Standing balance-Leahy Scale: Good                               Pertinent Vitals/Pain Pain Assessment: No/denies pain    Home Living Family/patient expects to be  discharged to:: Private residence Living Arrangements: Alone Available Help at Discharge: Friend(s);Available PRN/intermittently Type of Home: Mobile home (doublewide) Home Access: Ramped entrance     Home Layout: One level Home Equipment: Wheelchair - manual;Cane - single point;Walker - 4 wheels;Cane - quad;Shower seat      Prior Function Level of Independence: Independent with assistive device(s)         Comments: SPC most recently due to CVA     Hand Dominance   Dominant Hand: Right    Extremity/Trunk Assessment   Upper Extremity Assessment Upper Extremity Assessment: LUE deficits/detail;RUE deficits/detail RUE Deficits / Details: pt with R shoulder pain, difficulty assessing MMT, elbow and grip strong grossly 4+/5 LUE Deficits / Details: shoulder elbow and grip WFLs (at least 4+/5)    Lower Extremity Assessment Lower Extremity Assessment: RLE deficits/detail;LLE deficits/detail RLE Deficits / Details: able to move and lift independently, grossly 4+/5. Pt reported some thigh numbness that has been present since his last hospital admission LLE Deficits / Details: able to move and lift independently, grossly 4+/5    Cervical / Trunk Assessment Cervical / Trunk Assessment: Normal  Communication   Communication: No difficulties  Cognition Arousal/Alertness: Awake/alert Behavior During Therapy: WFL for tasks assessed/performed Overall Cognitive Status: Within Functional Limits for tasks assessed                                        General Comments      Exercises Other Exercises Other Exercises: Patient able to perform bed mobility several times; able to scoot up in bed modI, roll in bed modI. Other Exercises: dix hallpike positions tested, negative pt without complaint of dizziness as well Other Exercises: orthostatics assessed, WFLs, but pt may have experienced a significant change in diastolic BP (in sitting 72, in standing 62)    Assessment/Plan    PT Assessment Patient needs continued PT services  PT Problem List Decreased strength;Decreased activity tolerance;Decreased mobility;Decreased knowledge of use of DME;Decreased balance       PT Treatment Interventions DME instruction;Balance training;Gait training;Neuromuscular re-education;Functional mobility training;Stair training;Patient/family education;Therapeutic activities;Therapeutic exercise;Manual techniques    PT Goals (Current goals can be found in the Care Plan section)  Acute Rehab PT Goals Patient Stated Goal: to go home PT Goal Formulation: With patient Time For Goal Achievement: 02/01/21 Potential to Achieve Goals: Good    Frequency Min 2X/week   Barriers to discharge        Co-evaluation               AM-PAC PT "6 Clicks" Mobility  Outcome Measure Help needed turning from your back to your side while in a flat bed without using bedrails?: None Help needed moving from lying on your back to sitting on the side of a flat bed without using bedrails?: None Help needed moving to and from a bed to a chair (including a wheelchair)?: None Help needed standing up from a chair using your arms (e.g., wheelchair or bedside chair)?: None Help needed to  walk in hospital room?: A Little Help needed climbing 3-5 steps with a railing? : A Little 6 Click Score: 22    End of Session Equipment Utilized During Treatment: Gait belt Activity Tolerance: Patient tolerated treatment well Patient left: in bed;with call bell/phone within reach Nurse Communication: Mobility status PT Visit Diagnosis: Unsteadiness on feet (R26.81)    Time: 5621-3086 PT Time Calculation (min) (ACUTE ONLY): 46 min   Charges:   PT Evaluation $PT Eval Low Complexity: 1 Low PT Treatments $Therapeutic Exercise: 23-37 mins       Lieutenant Diego PT, DPT 1:19 PM,01/18/21

## 2021-01-18 NOTE — Consult Note (Signed)
Neurology Consultation Reason for Consult: Dizziness Requesting Physician: Judd Gaudier  CC: Dizziness  History is obtained from: Patient and chart review  HPI: Francisco Collier is a 80 y.o. male  With a past medical history significant for hypertension, hyperlipidemia, coronary artery disease s/p stent, lumbar spine surgery, COPD, presenting with dizziness.  Notably he is a somewhat inconsistent historian.  He reports that his dizziness has been constant that started, waking him up out of sleep at 11:15 PM last night.  However he then reports that the dizziness is improved with laying still and worse with walking although he initially endorses that it does not change with movement.  He reports that it is a feeling of blurry vision and does not have a sense of the room spinning around him.  He has not had any nausea or vomiting.  He denies any fevers, chills, sweats, cough, cold, fatigue, weight loss although he reports 6 months of reduced appetite (he is also quite hungry for lunch today).  He reports he has been having some pain in his right shoulder for the past 2 days, denies any trauma.  Denies any history of neck pain although he has some pain with movement of his neck to the right.  Denies any change in bowel or bladder movements, denies any change in genital or perianal sensation.   Of note, he last presented to the ED on 1/17 with headache and developed right-sided weakness (right facial droop, 4/5 in the right upper and lower extremity, concern for pain limitation of the right lower extremity only, reduced sensation to light touch on the right) while in triage for which he was treated with tPA.  MRI was negative and he was diagnosed with a likely TIA in the setting of small vessel risk factors with plan to treat with DAPT for 21 days followed by aspirin alone thereafter.  He reports he has been compliant with all his medications and that he has had some residual right-sided weakness since then  mostly in the right leg with some sensory change as well although the MRI brain was notably negative for acute stroke.  On review of outpatient records, back in 2016 obstructive sleep apnea was postulated to be contributing to his headaches and a sleep study was ordered but never completed.  He does not recall why the study was not completed.  LKW: 11:15 PM on 01/17/2021 tPA given?: No, out of the window and low concern for stroke Premorbid modified rankin scale:      1 - No significant disability. Able to carry out all usual activities, despite some symptoms.  ROS: A 14 point ROS was performed and is negative except as noted in the HPI.    Past Medical History:  Diagnosis Date  . Arthritis    back  . CAD (coronary artery disease)   . COPD (chronic obstructive pulmonary disease) ([place])   . Diverticulitis   . Esophageal stricture   . Gastric ulcer   . GERD (gastroesophageal reflux disease)   . Hyperlipemia   . Hypertension   . Kidney stone   . MI (myocardial infarction) ([place]) 2005  . Prostatic hypertrophy   . S/P orchiectomy   . Shortness of breath    Past Surgical History:  Procedure Laterality Date  . ANGIOPLASTY  2005   Stent  . APPENDECTOMY    . BACK SURGERY    . CARDIAC CATHETERIZATION N/A 11/12/2015   Procedure: Left Heart Cath and Coronary Angiography;  Surgeon: Charolette Forward,  MD;  Location: [facility];  Service: Cardiovascular;  Laterality: N/A;  . CHOLECYSTECTOMY    . CHOLECYSTECTOMY    . CORONARY ANGIOPLASTY    . ESOPHAGOGASTRODUODENOSCOPY N/A 10/06/2020   Procedure: ESOPHAGOGASTRODUODENOSCOPY (EGD);  Surgeon: Jerene Bears, MD;  Location: [facility];  Service: Gastroenterology;  Laterality: N/A;  . FOREIGN BODY REMOVAL  10/06/2020   Procedure: FOREIGN BODY REMOVAL;  Surgeon: Jerene Bears, MD;  Location: [facility];  Service: Gastroenterology;;  . THYROID SURGERY     Current Outpatient Medications  Medication Instructions  . amLODipine (NORVASC) 10  mg, Oral, Daily  . aspirin EC 81 mg, Oral, Daily, Swallow whole.  . clopidogrel (PLAVIX) 75 mg, Oral, Daily  . fenofibrate 160 mg, Oral, Daily  . fluticasone (FLONASE) 50 MCG/ACT nasal spray 1 spray, Each Nare, 2 times daily PRN  . gabapentin (NEURONTIN) 100 mg, Oral, Daily  . lisinopril (PRINIVIL) 10 mg, Oral, Daily  . metoprolol tartrate (LOPRESSOR) 12.5 mg, Oral, 2 times daily  . oxyCODONE-acetaminophen (PERCOCET) 10-325 MG tablet 1 tablet, Oral, 3 times daily PRN  . pantoprazole (PROTONIX) 20 mg, Oral, Daily  . terazosin (HYTRIN) 1 mg, Oral, Daily     Family History  Problem Relation Age of Onset  . Lung cancer Father   . Colon cancer Neg Hx   . Migraines Neg Hx     Social History:  reports that he quit smoking about 22 years ago. He quit after 40.00 years of use. He has never used smokeless tobacco. He reports that he does not drink alcohol and does not use drugs.   Exam: Current vital signs: BP 122/80   Pulse 78   Temp (!) 97.4 F (36.3 C)   Resp 19   Ht 6\' 2"  (1.88 m)   Wt 102.1 kg   SpO2 90%   BMI 28.89 kg/m  Vital signs in last 24 hours: Temp:  [97.4 F (36.3 C)-98 F (36.7 C)] 97.4 F (36.3 C) (02/07 1100) Pulse Rate:  [78-88] 78 (02/07 1030) Resp:  [16-19] 19 (02/07 1030) BP: (110-130)/(68-80) 122/80 (02/07 1030) SpO2:  [90 %-97 %] 90 % (02/07 1030) Weight:  [102.1 kg] 102.1 kg (02/07 0111)   Physical Exam  Constitutional: Appears well-developed and well-nourished.  Psych: Affect appropriate to situation, pleasant, occasionally joking Eyes: No scleral injection HENT: No OP obstruction.  Bruised tongue as detailed in CN XII MSK: no joint deformities.  He does have some neck pain with turning his head towards the right. Cardiovascular: Normal rate and regular rhythm.  Respiratory: Effort normal, non-labored breathing GI: Soft.  No distension. There is no tenderness.  Skin: WDI  Neuro: Mental Status: Patient is awake, alert, oriented to person,  place, but not month (reports January, able to correct to Feb w/ prompting), year, and situation. Patient is able to give some history though somewhat inconsistent in answers as documented above  No signs of aphasia or neglect Cranial Nerves: II: Visual Fields are full.  III,IV, VI: EOMI without ptosis or diploplia.  Has difficulty relaxing his neck fully for head impulse testing.  Inconsistent catch-up saccades intermittently.  No skew deviation.  No nystagmus.  Bilateral Dix-Hallpike testing performed with the assistance of PT negative. V: Facial sensation is symmetric to light touch VII: Facial movement is symmetric.  VIII: hearing is intact to voice and finger rub X: Uvula elevates symmetrically XI: Shoulder shrug is symmetric. XII: tongue is midline without atrophy or fasciculations; appears bruised which he reports is baseline secondary  to being edentulous and using his tongue to "mash" food. Motor: Tone is normal. Bulk is normal. 5/5 strength was present in all four extremities except for 4/5 right deltoid and right biceps (pain limited), 4+ right hip extension and right knee flexion. Sensory: Symmetric to light touch in all 4 extremities. Deep Tendon Reflexes: 3+ and symmetric in the brachioradialis and patella Cerebellar: No truncal ataxia Gait: Able to rise on heels and toes.  Mildly wide-based/cautious casual gait which she reports is currently at his baseline with no recurrence of his symptoms  I have reviewed labs in epic and the results pertinent to this consultation are: UA negative for infection, creatinine at his baseline of 1-1.2 (presently 1.1)  Lab Results  Component Value Date   CHOL 100 12/29/2020   HDL 29 (L) 12/29/2020   LDLCALC 55 12/29/2020   TRIG 79 12/29/2020   CHOLHDL 3.4 12/29/2020   Lab Results  Component Value Date   HGBA1C 4.7 (L) 12/29/2020   CBC within normal limits on all parameters  I have reviewed the images obtained: MRI brain negative  for acute intracranial process, personally reviewed  Additionally, right parotid mass present, 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  Evaluation is somewhat challenged by the inconsistent history he provides and notably he is also disoriented to month, reporting initially that it is January.  He does also appear to have some right sided weakness, the upper extremity likely related to shoulder pain in the lower extremity of unclear etiology.  His last neurological examination was documented as normal strength throughout, although he reports he has persistently had some right-sided weakness since his episode in January.  Given his history of degenerative disc disease, as well as some hyperreflexia on my examination, it would be prudent to rule out spinal cord compression or new lower extremity nerve root compression with MRI.  He otherwise reports he is back to his recent baseline, and therefore if these studies do not demonstrate an acute process, recommend continued outpatient follow-up with neurology  Recommendations: -MRI C-spine, T-spine, L-spine -Should the studies be negative, outpatient neurology follow-up -Management of medical comorbidities per primary team   Lesleigh Noe MD-PhD [facility] [phone]

## 2021-01-18 NOTE — Progress Notes (Signed)
   01/18/21 1225  Assess: MEWS Score  Temp 97.9 F (36.6 C)  BP 121/72  Pulse Rate 92  Resp 18  SpO2 98 %  O2 Device Room Air   Pt arrive to room 142. Pt A&Ox4. Call bell within reach.

## 2021-01-18 NOTE — ED Provider Notes (Signed)
[facility] Emergency Department Provider Note  ____________________________________________   Event Date/Time   First MD Initiated Contact with Patient 01/18/21 0304     (approximate)  I have reviewed the triage vital signs and the nursing notes.   HISTORY  Chief Complaint Dizziness    HPI Francisco Collier is a 80 y.o. male with history of CAD, COPD, hypertension, hyperlipidemia, stroke who presents to the emergency department with vertigo.  States he went to bed last night at 10:30 PM and felt fine.  Woke up at 11:50 PM and felt the room was spinning.  Symptoms have improved but not completely resolved.  He states he feels fullness in both ears and feels like he is having a hard time hearing but no ear pain, drainage or tinnitus.  Has had vertigo before.  Did not take any medications prior to arrival.  It appears he were admitted to [facility] in January 2022 for an acute stroke.  At that time had a headache behind the left eye and right-sided weakness.  He did receive TPA and there was documentation of complete resolution of symptoms.  He is on aspirin and Plavix.  He is weaker in his right arm and leg at this time but states that he feels this is chronic since his stroke.  Also reports right leg numbness that is chronic since his stroke.  States he lives at home alone and uses a cane to ambulate.  States he was not able to walk tonight due to his vertigo.  Denies any falls, head injury.  No headache.  Denies any new sensory deficits.  He denies any change in vertigo with turning his head in one direction.        Past Medical History:  Diagnosis Date  . Arthritis    back  . CAD (coronary artery disease)   . COPD (chronic obstructive pulmonary disease) ([place])   . Diverticulitis   . Esophageal stricture   . Gastric ulcer   . GERD (gastroesophageal reflux disease)   . Hyperlipemia   . Hypertension   . Kidney stone   . MI (myocardial infarction) ([place])  2005  . Prostatic hypertrophy   . S/P orchiectomy   . Shortness of breath     Patient Active Problem List   Diagnosis Date Noted  . Stroke ([place]) 12/28/2020  . Food impaction of esophagus   . Esophageal stricture   . Unstable angina ([place]) 11/11/2015  . Snoring 02/13/2015  . Excessive daytime sleepiness 02/13/2015  . Morning headache 02/13/2015  . Frequent nocturnal awakening 02/13/2015  . Nocturnal headaches 02/13/2015  . 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 Obesity 02/13/2015  . Diverticulosis 07/29/2011  . 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 CAD 09/30/2009    Past Surgical History:  Procedure Laterality Date  . ANGIOPLASTY  2005   Stent  . APPENDECTOMY    . BACK SURGERY    . CARDIAC CATHETERIZATION N/A 11/12/2015   Procedure: Left Heart Cath and Coronary Angiography;  Surgeon: Charolette Forward, MD;  Location: [facility];  Service: Cardiovascular;  Laterality: N/A;  . CHOLECYSTECTOMY    . CHOLECYSTECTOMY    . CORONARY ANGIOPLASTY    . ESOPHAGOGASTRODUODENOSCOPY N/A 10/06/2020   Procedure: ESOPHAGOGASTRODUODENOSCOPY (EGD);  Surgeon: Jerene Bears, MD;  Location: [facility];  Service: Gastroenterology;  Laterality: N/A;  . FOREIGN BODY REMOVAL  10/06/2020   Procedure: FOREIGN BODY REMOVAL;  Surgeon: Hilarie Fredrickson,  Lajuan Lines, MD;  Location: [facility];  Service: Gastroenterology;;  . THYROID SURGERY      Prior to Admission medications   Medication Sig Start Date End Date Taking? Authorizing Provider  amLODipine (NORVASC) 10 MG tablet Take 10 mg by mouth daily. 10/28/20   [provider]  aspirin EC 81 MG tablet Take 1 tablet (81 mg total) by mouth daily for 21 days. Swallow whole. 12/30/20 01/20/21  Dagar, Meredith Staggers, MD  clopidogrel (PLAVIX) 75 MG tablet Take 1 tablet (75 mg total) by mouth daily. 12/30/20 12/30/21  Dagar, Meredith Staggers, MD  fenofibrate 160 MG tablet Take 160 mg by mouth daily.    [provider]   fluticasone (FLONASE) 50 MCG/ACT nasal spray Place 1 spray into both nostrils 2 (two) times daily as needed for allergies or rhinitis.     [provider]  gabapentin (NEURONTIN) 100 MG capsule Take 100 mg by mouth daily. 12/23/20   [provider]  lisinopril (PRINIVIL) 10 MG tablet Take 1 tablet (10 mg total) by mouth daily. 11/13/15   Charolette Forward, MD  metoprolol tartrate (LOPRESSOR) 25 MG tablet Take 0.5 tablets (12.5 mg total) by mouth 2 (two) times daily. 11/13/15   Charolette Forward, MD  oxyCODONE-acetaminophen (PERCOCET) 10-325 MG tablet Take 1 tablet by mouth 3 (three) times daily as needed for pain. 12/23/20   [provider]  pantoprazole (PROTONIX) 20 MG tablet Take 1 tablet (20 mg total) by mouth daily. 10/06/20   Isla Pence, MD  terazosin (HYTRIN) 1 MG capsule Take 1 mg by mouth daily. 11/23/20   [provider]    Allergies Codeine, Niaspan [niacin er], and Statins  Family History  Problem Relation Age of Onset  . Lung cancer Father   . Colon cancer Neg Hx   . Migraines Neg Hx     Social History Social History   Tobacco Use  . Smoking status: Former Smoker    Years: 40.00    Quit date: 12/12/1998    Years since quitting: 22.1  . Smokeless tobacco: Never Used  Vaping Use  . Vaping Use: Never used  Substance Use Topics  . Alcohol use: No  . Drug use: No    Review of Systems Constitutional: No fever. Eyes: No visual changes. ENT: No sore throat. Cardiovascular: Denies chest pain. Respiratory: Denies shortness of breath. Gastrointestinal: No nausea, vomiting, diarrhea. Genitourinary: Negative for dysuria. Musculoskeletal: Negative for back pain. Skin: Negative for rash. Neurological: Negative for focal weakness or numbness.  ____________________________________________   PHYSICAL EXAM:  VITAL SIGNS: ED Triage Vitals  Enc Vitals Group     BP 01/18/21 0116 115/73     Pulse Rate 01/18/21 0116 85     Resp 01/18/21  0116 18     Temp 01/18/21 0116 97.9 F (36.6 C)     Temp Source 01/18/21 0116 Oral     SpO2 01/18/21 0116 97 %     Weight 01/18/21 0111 225 lb (102.1 kg)     Height 01/18/21 0111 6\' 2"  (1.88 m)     Head Circumference --      Peak Flow --      Pain Score 01/18/21 0111 0     Pain Loc --      Pain Edu? --      Excl. in [place]? --    CONSTITUTIONAL: Alert and oriented and responds appropriately to questions.  Elderly.  In no distress. HEAD: Normocephalic, appears atraumatic EYES: Conjunctivae clear, pupils appear equal, EOM appear  intact, no nystagmus ENT: normal nose; moist mucous membranes; TMs are clear bilaterally without erythema, purulence, bulging, perforation, effusion.  No cerumen impaction or sign of foreign body in the external auditory canal. No inflammation, erythema or drainage from the external auditory canal. No signs of mastoiditis. No pain with manipulation of the pinna bilaterally. NECK: Supple, normal ROM CARD: RRR; S1 and S2 appreciated; no murmurs, no clicks, no rubs, no gallops RESP: Normal chest excursion without splinting or tachypnea; breath sounds clear and equal bilaterally; no wheezes, no rhonchi, no rales, no hypoxia or respiratory distress, speaking full sentences ABD/GI: Normal bowel sounds; non-distended; soft, non-tender, no rebound, no guarding, no peritoneal signs, no hepatosplenomegaly BACK: The back appears normal EXT: Tender to palpation diffusely throughout the right shoulder without deformity, redness, warmth, joint effusion.  Pain with flexion past 30 degrees.  Pain with internal and external rotation.  2+ DP pulses and radial pulses bilaterally.  Compartments soft.  Otherwise extremities nontender.  No peripheral edema noted. SKIN: Normal color for age and race; warm; no rash on exposed skin NEURO: Patient has weakness in the right upper and lower extremity where he is barely able to lift either extremity off of the bed.  He states he is having pain in the  right shoulder.  He states his leg weakness has been present since his stroke in January 2022.  Normal strength in the left upper and lower extremity.  Cranial nerves II through XII intact.  Normal speech.  Unable to assess gait at this time.  Reports normal sensation diffusely.  No dysmetria to finger-nose testing with the left upper extremity.  Unable to assess with the right arm due to right shoulder pain.  He has normal grip strength in his right hand.   PSYCH: The patient's mood and manner are appropriate.  ____________________________________________   LABS (all labs ordered are listed, but only abnormal results are displayed)  Labs Reviewed  BASIC METABOLIC PANEL - Abnormal; Notable for the following components:      Result Value   Glucose, Bld 119 (*)    All other components within normal limits  CBC  URINALYSIS, COMPLETE (UACMP) WITH MICROSCOPIC   ____________________________________________  EKG   Date: 01/18/2021 1:04 AM  Rate: 84  Rhythm: normal sinus rhythm  QRS Axis: normal  Intervals: normal  ST/T Wave abnormalities: normal  Conduction Disutrbances: none  Narrative Interpretation: Q waves in septal leads, no significant change compared to previous EKG     ____________________________________________  RADIOLOGY I, Kirby Cortese, personally viewed and evaluated these images (plain radiographs) as part of my medical decision making, as well as reviewing the written report by the radiologist.  ED MD interpretation: CT head and MRI brain showed no acute abnormality.  X-ray of the right shoulder shows no fracture or dislocation.  Official radiology report(s): DG Shoulder Right  Result Date: 01/18/2021 CLINICAL DATA:  Right shoulder pain.  No known injury. EXAM: RIGHT SHOULDER - 2+ VIEW COMPARISON:  None. FINDINGS: Degenerative changes in the right [facility] and glenohumeral joint, most pronounced in the [facility] joint. Loss of subacromial space likely related to chronic rotator  cuff disease. No acute bony abnormality. Specifically, no fracture, subluxation, or dislocation. IMPRESSION: Degenerative changes in the right shoulder. No acute bony abnormality. Electronically Signed   By: Rolm Baptise M.D.   On: 01/18/2021 03:52   CT Head Wo Contrast  Result Date: 01/18/2021 CLINICAL DATA:  Dizziness EXAM: CT HEAD WITHOUT CONTRAST TECHNIQUE: Contiguous axial images were obtained from  the base of the skull through the vertex without intravenous contrast. COMPARISON:  12/28/2020 FINDINGS: Brain: Mild atrophy, most pronounced in the frontal lobes. No acute intracranial abnormality. Specifically, no hemorrhage, hydrocephalus, mass lesion, acute infarction, or significant intracranial injury. Vascular: No hyperdense vessel or unexpected calcification. Skull: No acute calvarial abnormality. Sinuses/Orbits: No acute findings Other: None IMPRESSION: Cerebral atrophy, most pronounced in the frontal lobes. No acute intracranial abnormality. Electronically Signed   By: Rolm Baptise M.D.   On: 01/18/2021 02:51   MR BRAIN WO CONTRAST  Result Date: 01/18/2021 CLINICAL DATA:  80 year old male with dizziness. EXAM: MRI HEAD WITHOUT CONTRAST TECHNIQUE: Multiplanar, multiecho pulse sequences of the brain and surrounding structures were obtained without intravenous contrast. COMPARISON:  Brain MRI 12/29/2020 and earlier. Head CT earlier today. Brain MRI 12/31/2008. FINDINGS: Brain: No restricted diffusion to suggest acute infarction. No midline shift, mass effect, evidence of mass lesion, ventriculomegaly, extra-axial collection or acute intracranial hemorrhage. Cervicomedullary junction and pituitary are within normal limits. Stable cerebral volume. Patchy and scattered cerebral white matter T2 and FLAIR hyperintensity is stable and mild to moderate for age. No cortical encephalomalacia or chronic cerebral blood products identified. Small T2 hyperintense foci in the deep gray nuclei are compatible with small  chronic lacunar infarcts, including in the right thalamus. Negative brainstem and cerebellum. Vascular: Major intracranial vascular flow voids are stable. Mild generalized intracranial artery tortuosity. Skull and upper cervical spine: No acute osseous abnormality identified. Sinuses/Orbits: Stable, negative. Other: Visible internal auditory structures appear normal. Normal stylomastoid foramina. There is a chronic partially calcified soft tissue mass occupying both the right parotid and right parapharyngeal spaces. 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 IMPRESSION: 1. No acute intracranial abnormality. 2. Stable noncontrast MRI appearance of the brain since 2016 with mild to moderate for age chronic small vessel disease. 3. 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 This was described on neck CTA last month, but has been present since 2007 in keeping with benign or indolent process. Electronically Signed   By: Genevie Ann M.D.   On: 01/18/2021 05:14    ____________________________________________   PROCEDURES  Procedure(s) performed (including Critical Care):  None  ____________________________________________   INITIAL IMPRESSION / ASSESSMENT AND PLAN / ED COURSE  As part of my medical decision making, I reviewed the following data within the [place] notes reviewed and incorporated, Labs reviewed, EKG interpreted NSR, Old chart reviewed, Radiograph reviewed, Discussed with admitting physician and Notes from prior ED visits         Patient here with acute onset vertigo.  States he went to bed at 10:30 PM last night without symptoms.  Was just recently admitted for stroke where he received TPA in the right 17th 2022.  He reports he has had right leg numbness and weakness since that time  that is unchanged.  He also seems to have some difficulty lifting the right arm but states this is due to shoulder pain.  Denies any injury to the shoulder.  Will obtain x-ray.    He states he has had vertigo before and complains of ear fullness and difficulty hearing.  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  His TMs appear normal.  He has no cerumen impaction.  Discussed with patient that this could  be peripheral vertigo but given his risk factors for stroke and that he just had a recent stroke, have recommended MRI of the brain without contrast.  He agrees.  Discussed with patient that this could be another acute stroke.  Currently he is not a TPA candidate given his symptoms are improving and he is outside the window for TPA.  He is not LVO positive.  We will treat symptoms with IV fluids, oral meclizine.  Labs obtained in triage are unremarkable.  EKG is nonischemic without arrhythmia.    5:50 AM  Pt's MRI shows no acute abnormality.  X-ray of the right shoulder shows degenerative changes but no other acute pathology.  Will p.o. challenge, attempt to ambulate with assistance.  Urine pending.  6:00 AM  Pt tolerating p.o.  Unable to walk more than 3 steps without becoming significantly symptomatic and feeling like he was going to fall.  Will admit for symptomatic peripheral vertigo.  He has received IV fluids, meclizine and Ativan here without relief.  Will give second dose of meclizine here.  Urine shows no sign of infection.  No ketones to suggest dehydration.  6:11 AM Discussed patient's case with hospitalist, Dr. Damita Dunnings through secure chat.  I have recommended admission and patient (and family if present) agree with this plan. Admitting physician will place admission orders.   I reviewed all nursing notes, vitals, pertinent previous records and reviewed/interpreted all EKGs, lab and urine results, imaging (as  available).   ____________________________________________   FINAL CLINICAL IMPRESSION(S) / ED DIAGNOSES  Final diagnoses:  Vertigo     ED Discharge Orders    None      *Please note:  Francisco Collier was evaluated in Emergency Department on 01/18/2021 for the symptoms described in the history of present illness. He was evaluated in the context of the global COVID-19 pandemic, which necessitated consideration that the patient might be at risk for infection with the SARS-CoV-2 virus that causes COVID-19. Institutional protocols and algorithms that pertain to the evaluation of patients at risk for COVID-19 are in a state of rapid change based on information released by regulatory bodies including the CDC and federal and state organizations. These policies and algorithms were followed during the patient's care in the ED.  Some ED evaluations and interventions may be delayed as a result of limited staffing during and the pandemic.*   Note:  This document was prepared using Dragon voice recognition software and may include unintentional dictation errors.   Janyce Ellinger, Delice Bison, DO 01/18/21 [phone]

## 2021-01-18 NOTE — ED Triage Notes (Signed)
Patient with complaint dizziness that started at 23:50 tonight. Patient states that he has a history of vertigo. Patient states that he has meclizine but did not take it.

## 2021-01-19 DIAGNOSIS — R42 Dizziness and giddiness: Secondary | ICD-10-CM | POA: Diagnosis not present

## 2021-01-19 LAB — CBC
HCT: 40.3 % (ref 39.0–52.0)
Hemoglobin: 13.6 g/dL (ref 13.0–17.0)
MCH: 29.9 pg (ref 26.0–34.0)
MCHC: 33.7 g/dL (ref 30.0–36.0)
MCV: 88.6 fL (ref 80.0–100.0)
Platelets: 291 10*3/uL (ref 150–400)
RBC: 4.55 MIL/uL (ref 4.22–5.81)
RDW: 13.6 % (ref 11.5–15.5)
WBC: 6.6 10*3/uL (ref 4.0–10.5)
nRBC: 0 % (ref 0.0–0.2)

## 2021-01-19 LAB — BASIC METABOLIC PANEL
Anion gap: 8 (ref 5–15)
BUN: 16 mg/dL (ref 8–23)
CO2: 26 mmol/L (ref 22–32)
Calcium: 8.9 mg/dL (ref 8.9–10.3)
Chloride: 105 mmol/L (ref 98–111)
Creatinine, Ser: 0.86 mg/dL (ref 0.61–1.24)
GFR, Estimated: >60 mL/min (ref >60)
Glucose, Bld: 102 mg/dL — ABNORMAL HIGH (ref 70–99)
Potassium: 4.3 mmol/L (ref 3.5–5.1)
Sodium: 139 mmol/L (ref 135–145)

## 2021-01-19 MED ORDER — MECLIZINE HCL 25 MG PO TABS: 25.0000 mg | ORAL_TABLET | Freq: Three times a day (TID) | ORAL | 1 refills | Status: DC | PRN

## 2021-01-19 NOTE — Progress Notes (Signed)
Pt discharged home with brother. Went over discharge instructions and pt showed understanding. Pt informed me that the Pharmacy needed to be changed. MD notified. Took IV out of Left forearm without complications. Pt wheeled down to medical mall by NT and helped into brothers car.

## 2021-01-19 NOTE — Discharge Summary (Signed)
Physician Discharge Summary   Francisco Collier  male DOB: 30-Oct-1941  MBW:[id number]  PCP: Tamsen Roers, MD  Admit date: 01/18/2021 Discharge date: 01/19/2021  Admitted From: home Disposition:  home CODE STATUS: Full code  Discharge Instructions    Ambulatory referral to Neurosurgery   Complete by: As directed    Discharge instructions   Complete by: As directed    Your dizziness has resolved, but I have ordered Antivert as needed if it happens again.  Since you have no numbness and weakness, neurosurgery is ok with you follow up with them as outpatient for your spinal stenosis.   Dr. Enzo Bi [facility] Course:  For full details, please see H&P, progress notes, consult notes and ancillary notes.  Briefly,  Francisco Collier is a 80 y.o. male with medical history significant for coronary artery disease, COPD, hypertension, dyslipidemia, recent acute CVA status post TPA who presented to the ER for evaluation of dizziness.    Patient was in his usual state of health when he went to bed at about 10:30 PM.  He woke up just before midnight and felt like the room was spinning.  This was associated with fullness and a ringing sensation in both ears.  He denies having any known aggravating or relieving factors and no worsening symptoms with movement of his head from side to side.  Patient was unable to ambulate due to his symptoms. He has a known history of vertigo but did not take any medicines prior to calling EMS.    Acute severe vertigo  MRI brain showed no new stroke.  Vertigo symptom resolved after presentation, and pt felt ready to go home.  Pt was prescribed Antivert PRN.    Cervical and lumbar spinal stenosis Neuro was consulted who recommended MRI spine due to pt's prior hx of spinal stenosis.  MRI spine showed severe stenosis at level of C6-7 and L4.  Discussed with Neurosurgery Dr. Lacinda Axon who will see pt as outpatient since pt had no numbness or weakness.     History of recent CVA With mild right-sided weakness Continue dual antiplatelet therapy  Hypertension Continue amlodipine and metoprolol  GERD Continue Protonix   Discharge Diagnoses:  Principal Problem:   Acute severe vertigo Active Problems:   History of CVA (cerebrovascular accident)   Hypertension    Discharge Instructions:  Allergies as of 01/19/2021      Reactions   Codeine Nausea Only   Pill form causes nausea Iv form does not   Niaspan [niacin Er] Other (See Comments)   Severe flushing   Statins Rash   Headaches       Medication List    TAKE these medications   amLODipine 10 MG tablet Commonly known as: NORVASC Take 10 mg by mouth daily.   aspirin EC 81 MG tablet Take 1 tablet (81 mg total) by mouth daily for 21 days. Swallow whole.   clopidogrel 75 MG tablet Commonly known as: Plavix Take 1 tablet (75 mg total) by mouth daily.   fenofibrate 160 MG tablet Take 160 mg by mouth daily.   fluticasone 50 MCG/ACT nasal spray Commonly known as: FLONASE Place 1 spray into both nostrils 2 (two) times daily as needed for allergies or rhinitis.   gabapentin 100 MG capsule Commonly known as: NEURONTIN Take 100 mg by mouth daily.   lisinopril 10 MG tablet Commonly known as: Prinivil Take 1 tablet (10 mg total) by mouth daily.  meclizine 25 MG tablet Commonly known as: ANTIVERT Take 1 tablet (25 mg total) by mouth 3 (three) times daily as needed for dizziness.   metoprolol tartrate 25 MG tablet Commonly known as: LOPRESSOR Take 0.5 tablets (12.5 mg total) by mouth 2 (two) times daily.   oxyCODONE-acetaminophen 10-325 MG tablet Commonly known as: PERCOCET Take 1 tablet by mouth 3 (three) times daily as needed for pain.   pantoprazole 20 MG tablet Commonly known as: PROTONIX Take 1 tablet (20 mg total) by mouth daily.   terazosin 1 MG capsule Commonly known as: HYTRIN Take 1 mg by mouth daily.        Follow-up Information    Deetta Perla, MD. Schedule an appointment as soon as possible for a visit in 1 week(s).   Specialty: Neurosurgery Why: cervical spine stenosis Contact information: [place] [address] [phone]        Tamsen Roers, MD. Schedule an appointment as soon as possible for a visit in 1 week(s).   Specialty: Family Medicine Contact information: [address] [place] [address] [phone]               Allergies  Allergen Reactions  . Codeine Nausea Only    Pill form causes nausea Iv form does not  . Niaspan [Niacin Er] Other (See Comments)    Severe flushing  . Statins Rash    Headaches      The results of significant diagnostics from this hospitalization (including imaging, microbiology, ancillary and laboratory) are listed below for reference.   Consultations:   Procedures/Studies: DG Shoulder Right  Result Date: 01/18/2021 CLINICAL DATA:  Right shoulder pain.  No known injury. EXAM: RIGHT SHOULDER - 2+ VIEW COMPARISON:  None. FINDINGS: Degenerative changes in the right [facility] and glenohumeral joint, most pronounced in the [facility] joint. Loss of subacromial space likely related to chronic rotator cuff disease. No acute bony abnormality. Specifically, no fracture, subluxation, or dislocation. IMPRESSION: Degenerative changes in the right shoulder. No acute bony abnormality. Electronically Signed   By: Rolm Baptise M.D.   On: 01/18/2021 03:52   CT Head Wo Contrast  Result Date: 01/18/2021 CLINICAL DATA:  Dizziness EXAM: CT HEAD WITHOUT CONTRAST TECHNIQUE: Contiguous axial images were obtained from the base of the skull through the vertex without intravenous contrast. COMPARISON:  12/28/2020 FINDINGS: Brain: Mild atrophy, most pronounced in the frontal lobes. No acute intracranial abnormality. Specifically, no hemorrhage, hydrocephalus, mass lesion, acute infarction, or significant intracranial injury. Vascular: No hyperdense vessel or unexpected calcification.  Skull: No acute calvarial abnormality. Sinuses/Orbits: No acute findings Other: None IMPRESSION: Cerebral atrophy, most pronounced in the frontal lobes. No acute intracranial abnormality. Electronically Signed   By: Rolm Baptise M.D.   On: 01/18/2021 02:51   MR BRAIN WO CONTRAST  Result Date: 01/18/2021 CLINICAL DATA:  80 year old male with dizziness. EXAM: MRI HEAD WITHOUT CONTRAST TECHNIQUE: Multiplanar, multiecho pulse sequences of the brain and surrounding structures were obtained without intravenous contrast. COMPARISON:  Brain MRI 12/29/2020 and earlier. Head CT earlier today. Brain MRI 12/31/2008. FINDINGS: Brain: No restricted diffusion to suggest acute infarction. No midline shift, mass effect, evidence of mass lesion, ventriculomegaly, extra-axial collection or acute intracranial hemorrhage. Cervicomedullary junction and pituitary are within normal limits. Stable cerebral volume. Patchy and scattered cerebral white matter T2 and FLAIR hyperintensity is stable and mild to moderate for age. No cortical encephalomalacia or chronic cerebral blood products identified. Small T2 hyperintense foci in the deep gray nuclei  are compatible with small chronic lacunar infarcts, including in the right thalamus. Negative brainstem and cerebellum. Vascular: Major intracranial vascular flow voids are stable. Mild generalized intracranial artery tortuosity. Skull and upper cervical spine: No acute osseous abnormality identified. Sinuses/Orbits: Stable, negative. Other: Visible internal auditory structures appear normal. Normal stylomastoid foramina. There is a chronic partially calcified soft tissue mass occupying both the right parotid and right parapharyngeal spaces. 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 IMPRESSION: 1. No acute intracranial abnormality. 2. Stable noncontrast MRI  appearance of the brain since 2016 with mild to moderate for age chronic small vessel disease. 3. 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 This was described on neck CTA last month, but has been present since 2007 in keeping with benign or indolent process. Electronically Signed   By: Genevie Ann M.D.   On: 01/18/2021 05:14   MR BRAIN WO CONTRAST  Result Date: 12/29/2020 CLINICAL DATA:  Stroke, follow up EXAM: MRI HEAD WITHOUT CONTRAST TECHNIQUE: Multiplanar, multiecho pulse sequences of the brain and surrounding structures were obtained without intravenous contrast. COMPARISON:  12/28/2020 and prior. FINDINGS: Brain: No diffusion-weighted signal abnormality. No intracranial hemorrhage. No midline shift, ventriculomegaly or extra-axial fluid collection. No mass lesion. Mild bifrontal predominant atrophy. Mild chronic microvascular ischemic changes. Vascular: Proximally preserved major intracranial flow voids. Skull and upper cervical spine: Normal marrow signal. Sinuses/Orbits: Normal orbits. Clear paranasal sinuses. No mastoid effusion. Other: Redemonstration of partially imaged T2 hyperintense right parotid space lesion. IMPRESSION: No acute intracranial process. Mild cerebral atrophy and chronic microvascular ischemic changes. Right neck lesion, better characterized on recent CTA neck. Electronically Signed   By: Primitivo Gauze M.D.   On: 12/29/2020 12:24   MR CERVICAL SPINE WO CONTRAST  Result Date: 01/18/2021 CLINICAL DATA:  Initial evaluation for myelopathy, right-sided weakness. EXAM: MRI CERVICAL, THORACIC AND LUMBAR SPINE WITHOUT CONTRAST TECHNIQUE: Multiplanar and multiecho pulse sequences of the cervical spine, to include the craniocervical junction and cervicothoracic junction, and thoracic and lumbar spine, were obtained without intravenous contrast. COMPARISON:  Prior CT from 12/28/2020 and MRI from 11/18/2014. FINDINGS: MRI CERVICAL SPINE  FINDINGS Alignment: Straightening of the normal cervical lordosis. No listhesis. Vertebrae: Vertebral body height maintained without acute or chronic fracture. Bone marrow signal intensity within normal limits. Small benign hemangioma noted within the C6 vertebral body. No worrisome osseous lesions. No abnormal marrow edema. Cord: Probable subtly increased signal abnormality within the cervical spinal cord at the level of C6-7 (series 29, image 8). Finding secondary to stenosis and compression due to exuberant osteophytic overgrowth at the left C6-7 facet (series 27, image 26). Signal intensity within the cervical spinal cord otherwise within normal limits. Posterior Fossa, vertebral arteries, paraspinal tissues: Visualized brain and posterior fossa within normal limits. Craniocervical junction normal. Paraspinous and prevertebral soft tissues demonstrate no acute finding. Soft tissue mass involving the right parotid and parapharyngeal spaces partially visualized, indeterminate, but grossly stable from previous exams. Normal flow voids seen within the vertebral arteries bilaterally. Disc levels: C2-C3: Bilateral uncovertebral hypertrophy without significant disc bulge, greater on the right. Moderate left with mild right facet degeneration. No spinal stenosis. Mild to moderate right C3 foraminal narrowing. Left neural foramina remains patent. C3-C4: Mild disc bulge with uncovertebral hypertrophy. Severe left with mild to moderate right facet degeneration. Mild spinal stenosis without cord impingement. Severe bilateral  C4 foraminal stenosis. C4-C5: Small central disc protrusion mildly indents the ventral thecal sac (series 27, image 15). Severe right with mild left facet degeneration. Right C4-5 facets are ankylosed. Mild spinal stenosis with mild flattening of the ventral spinal cord. Mild to moderate right greater than left C5 foraminal stenosis. C5-C6: Mild disc bulge with uncovertebral hypertrophy. Moderate facet  and ligament flavum thickening, worse on the right. Mild spinal stenosis with mild cord flattening. Moderate left worse than right C6 foraminal narrowing. C6-C7: Diffuse disc bulge with bilateral uncovertebral hypertrophy. Bilateral facet degeneration with prominent osteophytic overgrowth at the medial aspect of the left C6-7 facet (series 27, image 25). This appears calcified on prior CT. Secondary mass effect on the adjacent cord which is flattened along its left lateral aspect. Cord itself is deviated to the right. Suspected subtle cord signal changes at this level as above. Severe spinal stenosis. Mild to moderate left with mild right C7 foraminal stenosis. C7-T1: Mild disc bulge with uncovertebral and facet hypertrophy. No spinal stenosis. Foramina remain patent. MRI THORACIC SPINE FINDINGS Alignment: Vertebral bodies normally aligned with preservation of the normal thoracic kyphosis. No listhesis. Vertebrae: Vertebral body height maintained without acute or chronic fracture. Bone marrow signal intensity within normal limits. No discrete or worrisome osseous lesions. No abnormal marrow edema. Cord:  Normal signal and morphology. Paraspinal and other soft tissues: Unremarkable. Disc levels: T1-2: Mild left-sided facet hypertrophy. Otherwise negative. No stenosis. T2-3: Mild facet hypertrophy.  Otherwise negative.  No stenosis. T3-4:  Mild facet hypertrophy.  Otherwise negative.  No stenosis. T4-5:  Mild facet hypertrophy.  No stenosis. T5-6: Left paracentral disc protrusion indents the left ventral thecal sac, contacting and mildly flattening the left cord (series 25, image 17). No significant spinal stenosis or cord deformity. Mild facet hypertrophy. Foramina remain patent. T6-7: Small central to right central disc protrusion indents and mildly flattens the ventral thecal sac. Mild cord flattening without cord signal changes. Prominence of the dorsal epidural fat with resultant mild spinal stenosis. Foramina  remain patent. T7-8: Minimal disc bulge with posterior element hypertrophy. Prominence of the dorsal epidural fat. No significant spinal stenosis. Foramina remain patent. T8-9: Prominence of the dorsal epidural fat with mild posterior element hypertrophy. No significant stenosis. T9-10: Bilateral facet hypertrophy with mild prominence of the dorsal epidural fat. No significant spinal stenosis. Foramina remain patent. T10-11: Mild facet hypertrophy. No significant spinal stenosis. Foramina remain patent. T11-12: Mild disc bulge with superimposed tiny central disc protrusion. Bilateral facet hypertrophy. No significant spinal stenosis. Foramina remain patent. T12-L1:  Mild facet hypertrophy.  No canal or foraminal stenosis. MRI LUMBAR SPINE FINDINGS Segmentation: Standard. Lowest well-formed disc space labeled the L5-S1 level. Alignment: Trace retrolisthesis of L3 on L4. Alignment otherwise normal preservation of the normal lumbar lordosis. Vertebrae: Vertebral body height maintained without acute or chronic fracture. Bone marrow signal intensity within normal limits. Benign hemangioma noted within the L3 vertebral body. No other discrete or worrisome osseous lesions. No abnormal marrow edema. Conus medullaris and cauda equina: Conus extends to the T12-L1 level. Conus and cauda equina appear normal. Paraspinal and other soft tissues: Chronic postoperative changes present within the posterior paraspinous soft tissues. Asymmetric fatty atrophy within the left psoas muscle noted, suspected to be postoperative as well. Paraspinous soft tissues demonstrate no acute finding. Visualized visceral structures within normal limits. Disc levels: L1-2: Disc desiccation without significant disc bulge. Moderate bilateral facet hypertrophy. Mild spinal stenosis, mildly progressed from previous. Foramina remain patent. L2-3: Disc desiccation without significant disc  bulge. Moderate bilateral facet hypertrophy. Resultant mild canal  with bilateral lateral recess stenosis, similar to previous. Foramina remain patent. L3-4: Mild diffuse disc bulge with disc desiccation. Sequelae of prior wide posterior decompression. Residual bilateral facet hypertrophy. Thecal sac and lateral recesses remain widely patent. Moderate right with mild left L3 foraminal stenosis, similar to previous. L4-5: Diffuse disc bulge with disc desiccation. Superimposed right foraminal to extraforaminal disc protrusion contacts the exiting right L4 nerve root as it courses of the right neural foramen (series 9, image 27). This is slightly increased in size and more prominent as compared to previous exam. Severe right with moderate left facet hypertrophy. Sequelae of prior wide posterior decompression. No residual canal or lateral recess stenosis. Moderate to severe right with moderate left L4 foraminal stenosis, mildly progressed from previous. L5-S1: Negative interspace. Severe left greater than right facet hypertrophy. No significant spinal stenosis. Foramina remain patent. Appearance is stable. IMPRESSION: MRI CERVICAL SPINE IMPRESSION: 1. Prominent osteophytic overgrowth at the medial aspect of the left C6-7 facet due to facet degeneration and hypertrophy. Secondary mass effect on the adjacent thecal sac and spinal cord with subtly increased cord signal abnormality, which could reflect edema and/or myelomalacia. Associated severe spinal stenosis at this level. 2. Additional multilevel cervical spondylosis without additional high-grade spinal stenosis. Associated moderate to severe bilateral C3 through C7 foraminal stenosis as above. MRI THORACIC SPINE IMPRESSION: 1. Mild multilevel thoracic spondylosis with small disc protrusions at T5-6 and T6-7, but no significant canal or foraminal stenosis. 2. Normal appearance of the thoracic spinal cord. No cord signal changes to suggest edema or myelomalacia. MRI LUMBAR SPINE IMPRESSION: 1. Normal MRI appearance of the conus  medullaris and nerve roots of the cauda equina. 2. Previous posterior decompression at L3-4 and L4-5 without residual spinal stenosis 3. Severe right with moderate left L4 foraminal stenosis related to disc bulge and facet hypertrophy, mildly progressed from previous. Superimposed right foraminal to extraforaminal disc protrusion at this level could potentially affect the exiting right L4 nerve root. Electronically Signed   By: Jeannine Boga M.D.   On: 01/18/2021 14:50   MR THORACIC SPINE WO CONTRAST  Result Date: 01/18/2021 CLINICAL DATA:  Initial evaluation for myelopathy, right-sided weakness. EXAM: MRI CERVICAL, THORACIC AND LUMBAR SPINE WITHOUT CONTRAST TECHNIQUE: Multiplanar and multiecho pulse sequences of the cervical spine, to include the craniocervical junction and cervicothoracic junction, and thoracic and lumbar spine, were obtained without intravenous contrast. COMPARISON:  Prior CT from 12/28/2020 and MRI from 11/18/2014. FINDINGS: MRI CERVICAL SPINE FINDINGS Alignment: Straightening of the normal cervical lordosis. No listhesis. Vertebrae: Vertebral body height maintained without acute or chronic fracture. Bone marrow signal intensity within normal limits. Small benign hemangioma noted within the C6 vertebral body. No worrisome osseous lesions. No abnormal marrow edema. Cord: Probable subtly increased signal abnormality within the cervical spinal cord at the level of C6-7 (series 29, image 8). Finding secondary to stenosis and compression due to exuberant osteophytic overgrowth at the left C6-7 facet (series 27, image 26). Signal intensity within the cervical spinal cord otherwise within normal limits. Posterior Fossa, vertebral arteries, paraspinal tissues: Visualized brain and posterior fossa within normal limits. Craniocervical junction normal. Paraspinous and prevertebral soft tissues demonstrate no acute finding. Soft tissue mass involving the right parotid and parapharyngeal spaces  partially visualized, indeterminate, but grossly stable from previous exams. Normal flow voids seen within the vertebral arteries bilaterally. Disc levels: C2-C3: Bilateral uncovertebral hypertrophy without significant disc bulge, greater on the right. Moderate left with mild right  facet degeneration. No spinal stenosis. Mild to moderate right C3 foraminal narrowing. Left neural foramina remains patent. C3-C4: Mild disc bulge with uncovertebral hypertrophy. Severe left with mild to moderate right facet degeneration. Mild spinal stenosis without cord impingement. Severe bilateral C4 foraminal stenosis. C4-C5: Small central disc protrusion mildly indents the ventral thecal sac (series 27, image 15). Severe right with mild left facet degeneration. Right C4-5 facets are ankylosed. Mild spinal stenosis with mild flattening of the ventral spinal cord. Mild to moderate right greater than left C5 foraminal stenosis. C5-C6: Mild disc bulge with uncovertebral hypertrophy. Moderate facet and ligament flavum thickening, worse on the right. Mild spinal stenosis with mild cord flattening. Moderate left worse than right C6 foraminal narrowing. C6-C7: Diffuse disc bulge with bilateral uncovertebral hypertrophy. Bilateral facet degeneration with prominent osteophytic overgrowth at the medial aspect of the left C6-7 facet (series 27, image 25). This appears calcified on prior CT. Secondary mass effect on the adjacent cord which is flattened along its left lateral aspect. Cord itself is deviated to the right. Suspected subtle cord signal changes at this level as above. Severe spinal stenosis. Mild to moderate left with mild right C7 foraminal stenosis. C7-T1: Mild disc bulge with uncovertebral and facet hypertrophy. No spinal stenosis. Foramina remain patent. MRI THORACIC SPINE FINDINGS Alignment: Vertebral bodies normally aligned with preservation of the normal thoracic kyphosis. No listhesis. Vertebrae: Vertebral body height  maintained without acute or chronic fracture. Bone marrow signal intensity within normal limits. No discrete or worrisome osseous lesions. No abnormal marrow edema. Cord:  Normal signal and morphology. Paraspinal and other soft tissues: Unremarkable. Disc levels: T1-2: Mild left-sided facet hypertrophy. Otherwise negative. No stenosis. T2-3: Mild facet hypertrophy.  Otherwise negative.  No stenosis. T3-4:  Mild facet hypertrophy.  Otherwise negative.  No stenosis. T4-5:  Mild facet hypertrophy.  No stenosis. T5-6: Left paracentral disc protrusion indents the left ventral thecal sac, contacting and mildly flattening the left cord (series 25, image 17). No significant spinal stenosis or cord deformity. Mild facet hypertrophy. Foramina remain patent. T6-7: Small central to right central disc protrusion indents and mildly flattens the ventral thecal sac. Mild cord flattening without cord signal changes. Prominence of the dorsal epidural fat with resultant mild spinal stenosis. Foramina remain patent. T7-8: Minimal disc bulge with posterior element hypertrophy. Prominence of the dorsal epidural fat. No significant spinal stenosis. Foramina remain patent. T8-9: Prominence of the dorsal epidural fat with mild posterior element hypertrophy. No significant stenosis. T9-10: Bilateral facet hypertrophy with mild prominence of the dorsal epidural fat. No significant spinal stenosis. Foramina remain patent. T10-11: Mild facet hypertrophy. No significant spinal stenosis. Foramina remain patent. T11-12: Mild disc bulge with superimposed tiny central disc protrusion. Bilateral facet hypertrophy. No significant spinal stenosis. Foramina remain patent. T12-L1:  Mild facet hypertrophy.  No canal or foraminal stenosis. MRI LUMBAR SPINE FINDINGS Segmentation: Standard. Lowest well-formed disc space labeled the L5-S1 level. Alignment: Trace retrolisthesis of L3 on L4. Alignment otherwise normal preservation of the normal lumbar lordosis.  Vertebrae: Vertebral body height maintained without acute or chronic fracture. Bone marrow signal intensity within normal limits. Benign hemangioma noted within the L3 vertebral body. No other discrete or worrisome osseous lesions. No abnormal marrow edema. Conus medullaris and cauda equina: Conus extends to the T12-L1 level. Conus and cauda equina appear normal. Paraspinal and other soft tissues: Chronic postoperative changes present within the posterior paraspinous soft tissues. Asymmetric fatty atrophy within the left psoas muscle noted, suspected to be postoperative as well. Paraspinous  soft tissues demonstrate no acute finding. Visualized visceral structures within normal limits. Disc levels: L1-2: Disc desiccation without significant disc bulge. Moderate bilateral facet hypertrophy. Mild spinal stenosis, mildly progressed from previous. Foramina remain patent. L2-3: Disc desiccation without significant disc bulge. Moderate bilateral facet hypertrophy. Resultant mild canal with bilateral lateral recess stenosis, similar to previous. Foramina remain patent. L3-4: Mild diffuse disc bulge with disc desiccation. Sequelae of prior wide posterior decompression. Residual bilateral facet hypertrophy. Thecal sac and lateral recesses remain widely patent. Moderate right with mild left L3 foraminal stenosis, similar to previous. L4-5: Diffuse disc bulge with disc desiccation. Superimposed right foraminal to extraforaminal disc protrusion contacts the exiting right L4 nerve root as it courses of the right neural foramen (series 9, image 27). This is slightly increased in size and more prominent as compared to previous exam. Severe right with moderate left facet hypertrophy. Sequelae of prior wide posterior decompression. No residual canal or lateral recess stenosis. Moderate to severe right with moderate left L4 foraminal stenosis, mildly progressed from previous. L5-S1: Negative interspace. Severe left greater than right  facet hypertrophy. No significant spinal stenosis. Foramina remain patent. Appearance is stable. IMPRESSION: MRI CERVICAL SPINE IMPRESSION: 1. Prominent osteophytic overgrowth at the medial aspect of the left C6-7 facet due to facet degeneration and hypertrophy. Secondary mass effect on the adjacent thecal sac and spinal cord with subtly increased cord signal abnormality, which could reflect edema and/or myelomalacia. Associated severe spinal stenosis at this level. 2. Additional multilevel cervical spondylosis without additional high-grade spinal stenosis. Associated moderate to severe bilateral C3 through C7 foraminal stenosis as above. MRI THORACIC SPINE IMPRESSION: 1. Mild multilevel thoracic spondylosis with small disc protrusions at T5-6 and T6-7, but no significant canal or foraminal stenosis. 2. Normal appearance of the thoracic spinal cord. No cord signal changes to suggest edema or myelomalacia. MRI LUMBAR SPINE IMPRESSION: 1. Normal MRI appearance of the conus medullaris and nerve roots of the cauda equina. 2. Previous posterior decompression at L3-4 and L4-5 without residual spinal stenosis 3. Severe right with moderate left L4 foraminal stenosis related to disc bulge and facet hypertrophy, mildly progressed from previous. Superimposed right foraminal to extraforaminal disc protrusion at this level could potentially affect the exiting right L4 nerve root. Electronically Signed   By: Jeannine Boga M.D.   On: 01/18/2021 14:50   MR LUMBAR SPINE WO CONTRAST  Result Date: 01/18/2021 CLINICAL DATA:  Initial evaluation for myelopathy, right-sided weakness. EXAM: MRI CERVICAL, THORACIC AND LUMBAR SPINE WITHOUT CONTRAST TECHNIQUE: Multiplanar and multiecho pulse sequences of the cervical spine, to include the craniocervical junction and cervicothoracic junction, and thoracic and lumbar spine, were obtained without intravenous contrast. COMPARISON:  Prior CT from 12/28/2020 and MRI from 11/18/2014.  FINDINGS: MRI CERVICAL SPINE FINDINGS Alignment: Straightening of the normal cervical lordosis. No listhesis. Vertebrae: Vertebral body height maintained without acute or chronic fracture. Bone marrow signal intensity within normal limits. Small benign hemangioma noted within the C6 vertebral body. No worrisome osseous lesions. No abnormal marrow edema. Cord: Probable subtly increased signal abnormality within the cervical spinal cord at the level of C6-7 (series 29, image 8). Finding secondary to stenosis and compression due to exuberant osteophytic overgrowth at the left C6-7 facet (series 27, image 26). Signal intensity within the cervical spinal cord otherwise within normal limits. Posterior Fossa, vertebral arteries, paraspinal tissues: Visualized brain and posterior fossa within normal limits. Craniocervical junction normal. Paraspinous and prevertebral soft tissues demonstrate no acute finding. Soft tissue mass involving the right parotid  and parapharyngeal spaces partially visualized, indeterminate, but grossly stable from previous exams. Normal flow voids seen within the vertebral arteries bilaterally. Disc levels: C2-C3: Bilateral uncovertebral hypertrophy without significant disc bulge, greater on the right. Moderate left with mild right facet degeneration. No spinal stenosis. Mild to moderate right C3 foraminal narrowing. Left neural foramina remains patent. C3-C4: Mild disc bulge with uncovertebral hypertrophy. Severe left with mild to moderate right facet degeneration. Mild spinal stenosis without cord impingement. Severe bilateral C4 foraminal stenosis. C4-C5: Small central disc protrusion mildly indents the ventral thecal sac (series 27, image 15). Severe right with mild left facet degeneration. Right C4-5 facets are ankylosed. Mild spinal stenosis with mild flattening of the ventral spinal cord. Mild to moderate right greater than left C5 foraminal stenosis. C5-C6: Mild disc bulge with uncovertebral  hypertrophy. Moderate facet and ligament flavum thickening, worse on the right. Mild spinal stenosis with mild cord flattening. Moderate left worse than right C6 foraminal narrowing. C6-C7: Diffuse disc bulge with bilateral uncovertebral hypertrophy. Bilateral facet degeneration with prominent osteophytic overgrowth at the medial aspect of the left C6-7 facet (series 27, image 25). This appears calcified on prior CT. Secondary mass effect on the adjacent cord which is flattened along its left lateral aspect. Cord itself is deviated to the right. Suspected subtle cord signal changes at this level as above. Severe spinal stenosis. Mild to moderate left with mild right C7 foraminal stenosis. C7-T1: Mild disc bulge with uncovertebral and facet hypertrophy. No spinal stenosis. Foramina remain patent. MRI THORACIC SPINE FINDINGS Alignment: Vertebral bodies normally aligned with preservation of the normal thoracic kyphosis. No listhesis. Vertebrae: Vertebral body height maintained without acute or chronic fracture. Bone marrow signal intensity within normal limits. No discrete or worrisome osseous lesions. No abnormal marrow edema. Cord:  Normal signal and morphology. Paraspinal and other soft tissues: Unremarkable. Disc levels: T1-2: Mild left-sided facet hypertrophy. Otherwise negative. No stenosis. T2-3: Mild facet hypertrophy.  Otherwise negative.  No stenosis. T3-4:  Mild facet hypertrophy.  Otherwise negative.  No stenosis. T4-5:  Mild facet hypertrophy.  No stenosis. T5-6: Left paracentral disc protrusion indents the left ventral thecal sac, contacting and mildly flattening the left cord (series 25, image 17). No significant spinal stenosis or cord deformity. Mild facet hypertrophy. Foramina remain patent. T6-7: Small central to right central disc protrusion indents and mildly flattens the ventral thecal sac. Mild cord flattening without cord signal changes. Prominence of the dorsal epidural fat with resultant mild  spinal stenosis. Foramina remain patent. T7-8: Minimal disc bulge with posterior element hypertrophy. Prominence of the dorsal epidural fat. No significant spinal stenosis. Foramina remain patent. T8-9: Prominence of the dorsal epidural fat with mild posterior element hypertrophy. No significant stenosis. T9-10: Bilateral facet hypertrophy with mild prominence of the dorsal epidural fat. No significant spinal stenosis. Foramina remain patent. T10-11: Mild facet hypertrophy. No significant spinal stenosis. Foramina remain patent. T11-12: Mild disc bulge with superimposed tiny central disc protrusion. Bilateral facet hypertrophy. No significant spinal stenosis. Foramina remain patent. T12-L1:  Mild facet hypertrophy.  No canal or foraminal stenosis. MRI LUMBAR SPINE FINDINGS Segmentation: Standard. Lowest well-formed disc space labeled the L5-S1 level. Alignment: Trace retrolisthesis of L3 on L4. Alignment otherwise normal preservation of the normal lumbar lordosis. Vertebrae: Vertebral body height maintained without acute or chronic fracture. Bone marrow signal intensity within normal limits. Benign hemangioma noted within the L3 vertebral body. No other discrete or worrisome osseous lesions. No abnormal marrow edema. Conus medullaris and cauda equina: Conus extends to  the T12-L1 level. Conus and cauda equina appear normal. Paraspinal and other soft tissues: Chronic postoperative changes present within the posterior paraspinous soft tissues. Asymmetric fatty atrophy within the left psoas muscle noted, suspected to be postoperative as well. Paraspinous soft tissues demonstrate no acute finding. Visualized visceral structures within normal limits. Disc levels: L1-2: Disc desiccation without significant disc bulge. Moderate bilateral facet hypertrophy. Mild spinal stenosis, mildly progressed from previous. Foramina remain patent. L2-3: Disc desiccation without significant disc bulge. Moderate bilateral facet  hypertrophy. Resultant mild canal with bilateral lateral recess stenosis, similar to previous. Foramina remain patent. L3-4: Mild diffuse disc bulge with disc desiccation. Sequelae of prior wide posterior decompression. Residual bilateral facet hypertrophy. Thecal sac and lateral recesses remain widely patent. Moderate right with mild left L3 foraminal stenosis, similar to previous. L4-5: Diffuse disc bulge with disc desiccation. Superimposed right foraminal to extraforaminal disc protrusion contacts the exiting right L4 nerve root as it courses of the right neural foramen (series 9, image 27). This is slightly increased in size and more prominent as compared to previous exam. Severe right with moderate left facet hypertrophy. Sequelae of prior wide posterior decompression. No residual canal or lateral recess stenosis. Moderate to severe right with moderate left L4 foraminal stenosis, mildly progressed from previous. L5-S1: Negative interspace. Severe left greater than right facet hypertrophy. No significant spinal stenosis. Foramina remain patent. Appearance is stable. IMPRESSION: MRI CERVICAL SPINE IMPRESSION: 1. Prominent osteophytic overgrowth at the medial aspect of the left C6-7 facet due to facet degeneration and hypertrophy. Secondary mass effect on the adjacent thecal sac and spinal cord with subtly increased cord signal abnormality, which could reflect edema and/or myelomalacia. Associated severe spinal stenosis at this level. 2. Additional multilevel cervical spondylosis without additional high-grade spinal stenosis. Associated moderate to severe bilateral C3 through C7 foraminal stenosis as above. MRI THORACIC SPINE IMPRESSION: 1. Mild multilevel thoracic spondylosis with small disc protrusions at T5-6 and T6-7, but no significant canal or foraminal stenosis. 2. Normal appearance of the thoracic spinal cord. No cord signal changes to suggest edema or myelomalacia. MRI LUMBAR SPINE IMPRESSION: 1. Normal  MRI appearance of the conus medullaris and nerve roots of the cauda equina. 2. Previous posterior decompression at L3-4 and L4-5 without residual spinal stenosis 3. Severe right with moderate left L4 foraminal stenosis related to disc bulge and facet hypertrophy, mildly progressed from previous. Superimposed right foraminal to extraforaminal disc protrusion at this level could potentially affect the exiting right L4 nerve root. Electronically Signed   By: Jeannine Boga M.D.   On: 01/18/2021 14:50   ECHOCARDIOGRAM COMPLETE  Result Date: 12/29/2020    ECHOCARDIOGRAM REPORT   Patient Name:   KAPIL PETROPOULOS Letizia Date of Exam: 12/29/2020 Medical Rec #:  [id number]       Height:       74.0 in Accession #:    [id number]      Weight:       224.6 lb Date of Birth:  Aug 31, 1941       BSA:          2.284 m Patient Age:    62 years        BP:           117/69 mmHg Patient Gender: M               HR:           93 bpm. Exam Location:  Inpatient Procedure: 2D Echo, Cardiac Doppler, Color Doppler and Intracardiac  Opacification Agent Indications:    Stroke  History:        Patient has prior history of Echocardiogram examinations, most                 recent 12/31/2008. Previous Myocardial Infarction and CAD, COPD;                 Risk Factors:Diabetes, Hypertension and Dyslipidemia. GERD.  Sonographer:    Clayton Lefort RDCS (AE) Referring Phys: [place]  1. Left ventricular ejection fraction, by estimation, is 55 to 60%. The left ventricle has normal function. The left ventricle has no regional wall motion abnormalities. There is moderate left ventricular hypertrophy. Left ventricular diastolic parameters are consistent with Grade I diastolic dysfunction (impaired relaxation).  2. Right ventricular systolic function is normal. The right ventricular size is normal.  3. The mitral valve is normal in structure. No evidence of mitral valve regurgitation. No evidence of mitral stenosis.  4. The  aortic valve has an indeterminant number of cusps. Aortic valve regurgitation is trivial. No aortic stenosis is present.  5. Aortic dilatation noted. There is mild dilatation of the aortic root, measuring 40 mm.  6. The inferior vena cava is normal in size with greater than 50% respiratory variability, suggesting right atrial pressure of 3 mmHg. FINDINGS  Left Ventricle: Left ventricular ejection fraction, by estimation, is 55 to 60%. The left ventricle has normal function. The left ventricle has no regional wall motion abnormalities. Definity contrast agent was given IV to delineate the left ventricular  endocardial borders. The left ventricular internal cavity size was normal in size. There is moderate left ventricular hypertrophy. Left ventricular diastolic parameters are consistent with Grade I diastolic dysfunction (impaired relaxation). Right Ventricle: The right ventricular size is normal.Right ventricular systolic function is normal. Left Atrium: Left atrial size was normal in size. Right Atrium: Right atrial size was normal in size. Pericardium: Trivial pericardial effusion is present. Mitral Valve: The mitral valve is normal in structure. No evidence of mitral valve regurgitation. No evidence of mitral valve stenosis. Tricuspid Valve: The tricuspid valve is normal in structure. Tricuspid valve regurgitation is not demonstrated. No evidence of tricuspid stenosis. Aortic Valve: The aortic valve has an indeterminant number of cusps. Aortic valve regurgitation is trivial. Aortic regurgitation PHT measures 398 msec. No aortic stenosis is present. Aortic valve mean gradient measures 4.0 mmHg. Aortic valve peak gradient measures 6.7 mmHg. Aortic valve area, by VTI measures 3.46 cm. Pulmonic Valve: The pulmonic valve was not well visualized. Pulmonic valve regurgitation is not visualized. No evidence of pulmonic stenosis. Aorta: Aortic dilatation noted. There is mild dilatation of the aortic root, measuring 40  mm. Venous: The inferior vena cava is normal in size with greater than 50% respiratory variability, suggesting right atrial pressure of 3 mmHg.  LEFT VENTRICLE PLAX 2D LVIDd:         4.90 cm  Diastology LVIDs:         3.60 cm  LV e' medial:    6.74 cm/s LV PW:         1.50 cm  LV E/e' medial:  12.1 LV IVS:        1.90 cm  LV e' lateral:   5.98 cm/s LVOT diam:     2.10 cm  LV E/e' lateral: 13.6 LV SV:         83 LV SV Index:   36 LVOT Area:     3.46 cm  RIGHT VENTRICLE  IVC RV Basal diam:  3.00 cm     IVC diam: 1.40 cm RV S prime:     15.60 cm/s TAPSE (M-mode): 1.8 cm LEFT ATRIUM             Index       RIGHT ATRIUM           Index LA diam:        3.00 cm 1.31 cm/m  RA Area:     11.80 cm LA Vol (A2C):   50.0 ml 21.90 ml/m RA Volume:   22.30 ml  9.77 ml/m LA Vol (A4C):   45.5 ml 19.93 ml/m LA Biplane Vol: 49.2 ml 21.55 ml/m  AORTIC VALVE AV Area (Vmax):    3.41 cm AV Area (Vmean):   3.41 cm AV Area (VTI):     3.46 cm AV Vmax:           129.00 cm/s AV Vmean:          95.300 cm/s AV VTI:            0.240 m AV Peak Grad:      6.7 mmHg AV Mean Grad:      4.0 mmHg LVOT Vmax:         127.00 cm/s LVOT Vmean:        93.900 cm/s LVOT VTI:          0.240 m LVOT/AV VTI ratio: 1.00 AI PHT:            398 msec  AORTA Ao Root diam: 4.00 cm Ao Asc diam:  3.60 cm MITRAL VALVE MV Area (PHT): 2.87 cm     SHUNTS MV Decel Time: 264 msec     Systemic VTI:  0.24 m MV E velocity: 81.50 cm/s   Systemic Diam: 2.10 cm MV A velocity: 102.00 cm/s MV E/A ratio:  0.80 Kirk Ruths MD Electronically signed by Kirk Ruths MD Signature Date/Time: 12/29/2020/10:52:31 AM    Final    CT HEAD CODE STROKE WO CONTRAST  Result Date: 12/28/2020 CLINICAL DATA:  Code stroke.  Neuro deficit, acute, stroke suspected EXAM: CT HEAD WITHOUT CONTRAST TECHNIQUE: Contiguous axial images were obtained from the base of the skull through the vertex without intravenous contrast. COMPARISON:  04/16/2015 and prior. FINDINGS: Brain: No acute  infarct or intracranial hemorrhage. No mass lesion. No midline shift, ventriculomegaly or extra-axial fluid collection. Mild bifrontal predominant atrophy, unchanged. Vascular: No hyperdense vessel or unexpected calcification. Skull: No acute finding. Sinuses/Orbits: No acute finding. Other: None. ASPECTS [facility] Stroke Program Early CT Score) - Ganglionic level infarction (caudate, lentiform nuclei, internal capsule, insula, M1-M3 cortex): 7 - Supraganglionic infarction (M4-M6 cortex): 3 Total score (0-10 with 10 being normal): 10 IMPRESSION: 1. No acute intracranial process. 2. Mild bifrontal predominant atrophy. 3. ASPECTS is 10 4. Code stroke imaging results were communicated on 12/28/2020 at 3:45 pm to provider Dr. Leonel Ramsay via secure text paging. Electronically Signed   By: Primitivo Gauze M.D.   On: 12/28/2020 15:46   CT ANGIO HEAD CODE STROKE  Result Date: 12/28/2020 CLINICAL DATA:  Stroke/TIA, assess extracranial arteries; Neuro deficit, acute, stroke suspected EXAM: CT ANGIOGRAPHY HEAD AND NECK TECHNIQUE: Multidetector CT imaging of the head and neck was performed using the standard protocol during bolus administration of intravenous contrast. Multiplanar CT image reconstructions and MIPs were obtained to evaluate the vascular anatomy. Carotid stenosis measurements (when applicable) are obtained utilizing NASCET criteria, using the distal internal carotid diameter as the denominator. CONTRAST:  46mL OMNIPAQUE IOHEXOL 350 MG/ML SOLN COMPARISON:  12/28/2020 and prior. FINDINGS: CTA NECK FINDINGS Aortic arch: Bovine arch anatomy. Mild aortic arch atheromatous disease. Imaged portion shows no evidence of aneurysm or dissection. No significant stenosis of the major arch vessel origins. Right carotid system: Patent. Minimal bifurcation atherosclerotic calcifications. Left carotid system: Patent. Minimal bifurcation atherosclerotic calcifications. Vertebral arteries: Dominant left vertebral artery.  Extrinsic compression from adjacent osteophytes with moderate narrowing of the left vertebral artery at the C3-4 level. Vertebral arteries remain patent. Skeleton: No acute finding.  Multilevel spondylosis. Other neck: Confluent soft tissue mass measuring 7.4 x 3.1 x 6.9 cm involving the right parapharyngeal space with scattered calcific foci. There is extension of soft tissue into the right parotid and submandibular spaces with likely involvement of the adjacent salivary glands. Upper chest: Emphysema.  No acute finding. Review of the MIP images confirms the above findings CTA HEAD FINDINGS Anterior circulation: Patent. Bilateral carotid siphon atherosclerotic calcifications with mild narrowing of the left cavernous and supraclinoid ICA. Patent ACAs. Left A1 segment hypoplasia. Patent MCAs. Posterior circulation: Patent vertebral arteries. The basilar and proximal superior cerebellar arteries are patent. Patent left left PCA with mild P1 segment narrowing. Patent right PCA. Mild narrowing of the right P1/P2 segments. Venous sinuses: As permitted by contrast timing, patent. Anatomic variants: Please see above Review of the MIP images confirms the above findings IMPRESSION: No large vessel occlusion or aneurysm. Moderate narrowing of the left vertebral artery at the C3-4 level secondary to adjacent osteophytosis. Mild narrowing of the left cavernous/supraclinoid ICA, left P1 and right P1/P2 segments. Right parapharyngeal, parotid and submandibular space lesion measuring 7.4 cm with calcific foci. Differential includes low-flow vascular malformation versus neoplasm. Electronically Signed   By: Primitivo Gauze M.D.   On: 12/28/2020 16:13   CT ANGIO NECK CODE STROKE  Result Date: 12/28/2020 CLINICAL DATA:  Stroke/TIA, assess extracranial arteries; Neuro deficit, acute, stroke suspected EXAM: CT ANGIOGRAPHY HEAD AND NECK TECHNIQUE: Multidetector CT imaging of the head and neck was performed using the standard  protocol during bolus administration of intravenous contrast. Multiplanar CT image reconstructions and MIPs were obtained to evaluate the vascular anatomy. Carotid stenosis measurements (when applicable) are obtained utilizing NASCET criteria, using the distal internal carotid diameter as the denominator. CONTRAST:  69mL OMNIPAQUE IOHEXOL 350 MG/ML SOLN COMPARISON:  12/28/2020 and prior. FINDINGS: CTA NECK FINDINGS Aortic arch: Bovine arch anatomy. Mild aortic arch atheromatous disease. Imaged portion shows no evidence of aneurysm or dissection. No significant stenosis of the major arch vessel origins. Right carotid system: Patent. Minimal bifurcation atherosclerotic calcifications. Left carotid system: Patent. Minimal bifurcation atherosclerotic calcifications. Vertebral arteries: Dominant left vertebral artery. Extrinsic compression from adjacent osteophytes with moderate narrowing of the left vertebral artery at the C3-4 level. Vertebral arteries remain patent. Skeleton: No acute finding.  Multilevel spondylosis. Other neck: Confluent soft tissue mass measuring 7.4 x 3.1 x 6.9 cm involving the right parapharyngeal space with scattered calcific foci. There is extension of soft tissue into the right parotid and submandibular spaces with likely involvement of the adjacent salivary glands. Upper chest: Emphysema.  No acute finding. Review of the MIP images confirms the above findings CTA HEAD FINDINGS Anterior circulation: Patent. Bilateral carotid siphon atherosclerotic calcifications with mild narrowing of the left cavernous and supraclinoid ICA. Patent ACAs. Left A1 segment hypoplasia. Patent MCAs. Posterior circulation: Patent vertebral arteries. The basilar and proximal superior cerebellar arteries are patent. Patent left left PCA with mild P1 segment narrowing. Patent right PCA. Mild narrowing  of the right P1/P2 segments. Venous sinuses: As permitted by contrast timing, patent. Anatomic variants: Please see  above Review of the MIP images confirms the above findings IMPRESSION: No large vessel occlusion or aneurysm. Moderate narrowing of the left vertebral artery at the C3-4 level secondary to adjacent osteophytosis. Mild narrowing of the left cavernous/supraclinoid ICA, left P1 and right P1/P2 segments. Right parapharyngeal, parotid and submandibular space lesion measuring 7.4 cm with calcific foci. Differential includes low-flow vascular malformation versus neoplasm. Electronically Signed   By: Primitivo Gauze M.D.   On: 12/28/2020 16:13      Labs: BNP (last 3 results) No results for input(s): BNP in the last 8760 hours. Basic Metabolic Panel: Recent Labs  Lab 01/18/21 0116 01/19/21 0503  NA 136 139  K 3.9 4.3  CL 102 105  CO2 26 26  GLUCOSE 119* 102*  BUN 16 16  CREATININE 1.01 0.86  CALCIUM 9.1 8.9   Liver Function Tests: No results for input(s): AST, ALT, ALKPHOS, BILITOT, PROT, ALBUMIN in the last 168 hours. No results for input(s): LIPASE, AMYLASE in the last 168 hours. No results for input(s): AMMONIA in the last 168 hours. CBC: Recent Labs  Lab 01/18/21 0116 01/19/21 0503  WBC 8.9 6.6  HGB 15.2 13.6  HCT 45.8 40.3  MCV 88.1 88.6  PLT 345 291   Cardiac Enzymes: No results for input(s): CKTOTAL, CKMB, CKMBINDEX, TROPONINI in the last 168 hours. BNP: Invalid input(s): POCBNP CBG: No results for input(s): GLUCAP in the last 168 hours. D-Dimer No results for input(s): DDIMER in the last 72 hours. Hgb A1c No results for input(s): HGBA1C in the last 72 hours. Lipid Profile No results for input(s): CHOL, HDL, LDLCALC, TRIG, CHOLHDL, LDLDIRECT in the last 72 hours. Thyroid function studies No results for input(s): TSH, T4TOTAL, T3FREE, THYROIDAB in the last 72 hours.  Invalid input(s): FREET3 Anemia work up No results for input(s): VITAMINB12, FOLATE, FERRITIN, TIBC, IRON, RETICCTPCT in the last 72 hours. Urinalysis    Component Value Date/Time   COLORURINE  YELLOW (A) 01/18/2021 0529   APPEARANCEUR CLEAR (A) 01/18/2021 0529   LABSPEC 1.017 01/18/2021 0529   PHURINE 5.0 01/18/2021 0529   GLUCOSEU NEGATIVE 01/18/2021 0529   HGBUR NEGATIVE 01/18/2021 0529   BILIRUBINUR NEGATIVE 01/18/2021 0529   KETONESUR NEGATIVE 01/18/2021 0529   PROTEINUR NEGATIVE 01/18/2021 0529   UROBILINOGEN 1.0 09/12/2015 1658   NITRITE NEGATIVE 01/18/2021 0529   LEUKOCYTESUR NEGATIVE 01/18/2021 0529   Sepsis Labs Invalid input(s): PROCALCITONIN,  WBC,  LACTICIDVEN Microbiology Recent Results (from the past 240 hour(s))  SARS CORONAVIRUS 2 (TAT 6-24 HRS) Nasopharyngeal Nasopharyngeal Swab     Status: None   Collection Time: 01/18/21  6:16 AM   Specimen: Nasopharyngeal Swab  Result Value Ref Range Status   SARS Coronavirus 2 NEGATIVE NEGATIVE Final    Comment: (NOTE) SARS-CoV-2 target nucleic acids are NOT DETECTED.  The SARS-CoV-2 RNA is generally detectable in upper and lower respiratory specimens during the acute phase of infection. Negative results do not preclude SARS-CoV-2 infection, do not rule out co-infections with other pathogens, and should not be used as the sole basis for treatment or other patient management decisions. Negative results must be combined with clinical observations, patient history, and epidemiological information. The expected result is Negative.  Fact Sheet for Patients: SugarRoll.be  Fact Sheet for Healthcare Providers: https://www.woods-mathews.com/  This test is not yet approved or cleared by the [place] FDA and  has been authorized for detection and/or diagnosis  of SARS-CoV-2 by FDA under an Emergency Use Authorization (EUA). This EUA will remain  in effect (meaning this test can be used) for the duration of the COVID-19 declaration under Se ction 564(b)(1) of the Act, 21 U.S.C. section 360bbb-3(b)(1), unless the authorization is terminated or revoked sooner.  Performed at  [facility], [place] [address] [place], [place] [address]      Total time spend on discharging this patient, including the last patient exam, discussing the hospital stay, instructions for ongoing care as it relates to all pertinent caregivers, as well as preparing the medical discharge records, prescriptions, and/or referrals as applicable, is 45 minutes.    Enzo Bi, MD  [facility] 01/19/2021, 12:55 PM

## 2021-02-01 ENCOUNTER — Telehealth: Payer: Self-pay | Admitting: Internal Medicine

## 2021-02-01 NOTE — Telephone Encounter (Signed)
Left message for pt to call back  °

## 2021-02-01 NOTE — Telephone Encounter (Signed)
Inbound call from patient requesting to schedule an appointment with Dr. Hilarie Fredrickson.  Informed next availability will be end of April.  States he is having trouble eating and requests a call back from a nurse.  Please advise.

## 2021-02-03 NOTE — Telephone Encounter (Signed)
Left message for pt to call back  °

## 2021-02-04 NOTE — Telephone Encounter (Signed)
Attempted to call pt again, no answer and unable to leave a message.

## 2021-03-24 ENCOUNTER — Other Ambulatory Visit: Payer: Self-pay

## 2021-03-24 NOTE — Patient Outreach (Signed)
[facility]) Care Management  03/24/2021  Francisco Collier [birth year] [id number]   First telephone outreach attempt to obtain mRS. No answer. Left message for returned call.  Philmore Pali [facility] Management Assistant [phone]

## 2021-03-29 ENCOUNTER — Encounter: Payer: Self-pay | Admitting: *Deleted

## 2021-03-31 ENCOUNTER — Encounter: Payer: Self-pay | Admitting: *Deleted

## 2021-04-01 ENCOUNTER — Encounter: Payer: Self-pay | Admitting: *Deleted

## 2021-04-08 ENCOUNTER — Other Ambulatory Visit: Payer: Self-pay

## 2021-04-08 NOTE — Patient Outreach (Signed)
[facility]) Care Management  04/08/2021  CRIST KRUSZKA [birth year] [id number]   Second telephone outreach attempt to obtain mRS. No answer. Not able to leave message for returned call.  Philmore Pali [facility] Management Assistant [phone]

## 2021-04-14 ENCOUNTER — Other Ambulatory Visit: Payer: Self-pay

## 2021-04-14 NOTE — Patient Outreach (Signed)
[facility]) Care Management  04/14/2021  MONTE ZINNI 03-15-1941 [id number]  3 outreach attempts were completed to obtain mRs. mRs could not be obtained because patient never returned my calls. mRs=7    [place] Management Assistant [phone]

## 2021-07-15 ENCOUNTER — Emergency Department (HOSPITAL_COMMUNITY): Payer: Medicare HMO

## 2021-07-15 ENCOUNTER — Emergency Department (HOSPITAL_COMMUNITY)
Admission: EM | Admit: 2021-07-15 | Discharge: 2021-07-15 | Disposition: A | Discharge status: Home / Self Care | Payer: Medicare HMO | Attending: Emergency Medicine | Admitting: Emergency Medicine

## 2021-07-15 ENCOUNTER — Other Ambulatory Visit: Payer: Self-pay

## 2021-07-15 DIAGNOSIS — J449 Chronic obstructive pulmonary disease, unspecified: Secondary | ICD-10-CM | POA: Insufficient documentation

## 2021-07-15 DIAGNOSIS — Z955 Presence of coronary angioplasty implant and graft: Secondary | ICD-10-CM | POA: Diagnosis not present

## 2021-07-15 DIAGNOSIS — E860 Dehydration: Secondary | ICD-10-CM | POA: Diagnosis not present

## 2021-07-15 DIAGNOSIS — Z85038 Personal history of other malignant neoplasm of large intestine: Secondary | ICD-10-CM | POA: Insufficient documentation

## 2021-07-15 DIAGNOSIS — Z87891 Personal history of nicotine dependence: Secondary | ICD-10-CM | POA: Insufficient documentation

## 2021-07-15 DIAGNOSIS — I10 Essential (primary) hypertension: Secondary | ICD-10-CM | POA: Insufficient documentation

## 2021-07-15 DIAGNOSIS — I2510 Atherosclerotic heart disease of native coronary artery without angina pectoris: Secondary | ICD-10-CM | POA: Insufficient documentation

## 2021-07-15 DIAGNOSIS — R42 Dizziness and giddiness: Secondary | ICD-10-CM | POA: Diagnosis present

## 2021-07-15 DIAGNOSIS — Z79899 Other long term (current) drug therapy: Secondary | ICD-10-CM | POA: Insufficient documentation

## 2021-07-15 LAB — CBG MONITORING, ED: Glucose-Capillary: 110 mg/dL — ABNORMAL HIGH (ref 70–99)

## 2021-07-15 LAB — CBC WITH DIFFERENTIAL/PLATELET
Abs Immature Granulocytes: 0.01 10*3/uL (ref 0.00–0.07)
Basophils Absolute: 0 10*3/uL (ref 0.0–0.1)
Basophils Relative: 1 %
Eosinophils Absolute: 0.1 10*3/uL (ref 0.0–0.5)
Eosinophils Relative: 2 %
HCT: 40.8 % (ref 39.0–52.0)
Hemoglobin: 13.5 g/dL (ref 13.0–17.0)
Immature Granulocytes: 0 %
Lymphocytes Relative: 26 %
Lymphs Abs: 1.6 10*3/uL (ref 0.7–4.0)
MCH: 29.2 pg (ref 26.0–34.0)
MCHC: 33.1 g/dL (ref 30.0–36.0)
MCV: 88.1 fL (ref 80.0–100.0)
Monocytes Absolute: 0.6 10*3/uL (ref 0.1–1.0)
Monocytes Relative: 9 %
Neutro Abs: 3.9 10*3/uL (ref 1.7–7.7)
Neutrophils Relative %: 62 %
Platelets: 295 10*3/uL (ref 150–400)
RBC: 4.63 MIL/uL (ref 4.22–5.81)
RDW: 13 % (ref 11.5–15.5)
WBC: 6.2 10*3/uL (ref 4.0–10.5)
nRBC: 0 % (ref 0.0–0.2)

## 2021-07-15 LAB — COMPREHENSIVE METABOLIC PANEL
ALT: 20 U/L (ref 0–44)
AST: 20 U/L (ref 15–41)
Albumin: 3.2 g/dL — ABNORMAL LOW (ref 3.5–5.0)
Alkaline Phosphatase: 36 U/L — ABNORMAL LOW (ref 38–126)
Anion gap: 9 (ref 5–15)
BUN: 17 mg/dL (ref 8–23)
CO2: 25 mmol/L (ref 22–32)
Calcium: 8.7 mg/dL — ABNORMAL LOW (ref 8.9–10.3)
Chloride: 103 mmol/L (ref 98–111)
Creatinine, Ser: 1.03 mg/dL (ref 0.61–1.24)
GFR, Estimated: >60 mL/min (ref >60)
Glucose, Bld: 103 mg/dL — ABNORMAL HIGH (ref 70–99)
Potassium: 4.2 mmol/L (ref 3.5–5.1)
Sodium: 137 mmol/L (ref 135–145)
Total Bilirubin: 0.6 mg/dL (ref 0.3–1.2)
Total Protein: 5.8 g/dL — ABNORMAL LOW (ref 6.5–8.1)

## 2021-07-15 LAB — TROPONIN I (HIGH SENSITIVITY)
Troponin I (High Sensitivity): 5 ng/L (ref <18)
Troponin I (High Sensitivity): 5 ng/L (ref <18)

## 2021-07-15 MED ORDER — SODIUM CHLORIDE 0.9 % IV BOLUS
1000.0000 mL | Freq: Once | INTRAVENOUS | Status: AC
  Administered 2021-07-15: 1000 mL via INTRAVENOUS

## 2021-07-15 NOTE — ED Notes (Signed)
Patient ambulatory to restroom without issue.

## 2021-07-15 NOTE — ED Provider Notes (Signed)
Assumed care at change of shift, see prior note for complete H&P. Briefly, feeling unwell earlier today, improved after fluids and eating today, no longer orthostatic. Pending repeat trop, can be dc if unremarkable.  Physical Exam  BP 124/67   Pulse 80   Temp 98.1 F (36.7 C) (Oral)   Resp (!) 22   Ht '6\' 2"'$  (1.88 m)   Wt 102.1 kg   SpO2 96%   BMI 28.89 kg/m   Physical Exam  ED Course/Procedures   Clinical Course as of 07/15/21 1542  Thu Jul 15, 2021  1319 WBC: 6.2 [HK]  1319 Hemoglobin: 13.5 [HK]  1337 Creatinine: 1.03 [HK]  1352 Sodium: 137 [HK]  1352 Troponin I (High Sensitivity): 5 [HK]  1430 CT HEAD WO CONTRAST (5MM) No acute findings, does show age-related changes. [HK]    Clinical Course User Index [HK] Khatri, Hina, PA-C    Procedures  MDM  Repeat troponin is unchanged. Patient dc to follow up as planned.        Tacy Learn, PA-C 07/15/21 1542    Gareth Morgan, MD 07/15/21 2312

## 2021-07-15 NOTE — ED Triage Notes (Addendum)
Patient BIB [facility] after working outside for 30 minutes patient became extremely exhausted, started becoming nauseated and felt as though he was going to pass out. Pt drove himself to fire station for evaluation. They believed he was suffering for heat exhaustion. Pt does have cardiac hx with stent placement in 2005. Pt started to improve after being administered 500 CC of NS. Was given 4 mg of zofran by EMS but still complaining of feeling nauseated. EMS reported positive orthostatics with 10 pt drop in systolic. Patient also reporting that he has been feeling continuously dizzy for the past two days. Denies chest pain, SoB. Did eat breakfast this AM but has not been hydrating as well as he could per patient.  VS as follows:  BP-111/58 HR70 Spo2-99 RA CBG-86

## 2021-07-15 NOTE — Discharge Instructions (Addendum)
Make sure you are drinking plenty of fluids and eating several meals daily. Follow-up with your primary care provider. Follow-up with your cardiologist as well. Return to the ER if you start to experience worsening dizziness, chest pain, shortness of breath or severe headache.

## 2021-07-15 NOTE — ED Provider Notes (Signed)
[facility] EMERGENCY DEPARTMENT Provider Note   CSN: [id number] Arrival date & time: 07/15/21  1143     History No chief complaint on file.   Francisco Collier is a 80 y.o. male with a past medical history of COPD, CAD, prior MI, hypertension presenting to the ED with a chief complaint of dizziness.  States that over the past few weeks he has "not feeling well."  Today when he woke up he felt "like something was off."  He went outside to do some yard work when he began feeling flushed, lightheaded, nauseous.  He drove himself to the fire station to get help.  He reports very minimal improvement with IV fluids given by EMS.  He states that previously or currently did not have any chest pain, headache, vision changes, numbness in arms or legs.  His symptoms are worse with ambulation but are still present when he is sitting and laying down.  He does admit that he has not been eating or drinking much lately states that he does feel hungry but "I do not want to eat anything."  Denies any abdominal pain, urinary symptoms, fever, cough or sick contacts with similar symptoms. No head injury or falls.  HPI     Past Medical History:  Diagnosis Date   Arthritis    back   CAD (coronary artery disease)    COPD (chronic obstructive pulmonary disease) (HCC)    Diverticulitis    Esophageal stricture    Gastric ulcer    GERD (gastroesophageal reflux disease)    Hyperlipemia    Hypertension    Kidney stone    MI (myocardial infarction) ([place]) 2005   Prostatic hypertrophy    S/P orchiectomy    Shortness of breath     Patient Active Problem List   Diagnosis Date Noted   Acute severe vertigo 01/18/2021   History of CVA (cerebrovascular accident) 01/18/2021   Hypertension    Stroke ([place]) 12/28/2020   Food impaction of esophagus    Esophageal stricture    Unstable angina ([place]) 11/11/2015   Snoring 02/13/2015   Excessive daytime sleepiness 02/13/2015   Morning headache  02/13/2015   Frequent nocturnal awakening 02/13/2015   Nocturnal headaches 02/13/2015   New daily persistent headache 02/13/2015   Obesity 02/13/2015   Diverticulosis 07/29/2011   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   CAD 09/30/2009    Past Surgical History:  Procedure Laterality Date   ANGIOPLASTY  2005   Stent   APPENDECTOMY     BACK SURGERY     CARDIAC CATHETERIZATION N/A 11/12/2015   Procedure: Left Heart Cath and Coronary Angiography;  Surgeon: Charolette Forward, MD;  Location: [facility];  Service: Cardiovascular;  Laterality: N/A;   CHOLECYSTECTOMY     CHOLECYSTECTOMY     CORONARY ANGIOPLASTY     ESOPHAGOGASTRODUODENOSCOPY N/A 10/06/2020   Procedure: ESOPHAGOGASTRODUODENOSCOPY (EGD);  Surgeon: Jerene Bears, MD;  Location: [facility];  Service: Gastroenterology;  Laterality: N/A;   FOREIGN BODY REMOVAL  10/06/2020   Procedure: FOREIGN BODY REMOVAL;  Surgeon: Jerene Bears, MD;  Location: [facility];  Service: Gastroenterology;;   THYROID SURGERY         Family History  Problem Relation Age of Onset   Lung cancer Father    Colon cancer Neg Hx    Migraines Neg Hx     Social History   Tobacco Use   Smoking status: Former  Years: 40.00    Types: Cigarettes    Quit date: 12/12/1998    Years since quitting: 22.6   Smokeless tobacco: Never  Vaping Use   Vaping Use: Never used  Substance Use Topics   Alcohol use: No   Drug use: No    Home Medications Prior to Admission medications   Medication Sig Start Date End Date Taking? Authorizing Provider  amLODipine (NORVASC) 10 MG tablet Take 10 mg by mouth daily. 10/28/20   [provider]  clopidogrel (PLAVIX) 75 MG tablet Take 1 tablet (75 mg total) by mouth daily. 12/30/20 12/30/21  Dagar, Meredith Staggers, MD  fenofibrate 160 MG tablet Take 160 mg by mouth daily.    [provider]  fluticasone (FLONASE) 50 MCG/ACT nasal spray Place 1 spray into both  nostrils 2 (two) times daily as needed for allergies or rhinitis.     [provider]  gabapentin (NEURONTIN) 100 MG capsule Take 100 mg by mouth daily. 12/23/20   [provider]  lisinopril (PRINIVIL) 10 MG tablet Take 1 tablet (10 mg total) by mouth daily. 11/13/15   Charolette Forward, MD  meclizine (ANTIVERT) 25 MG tablet Take 1 tablet (25 mg total) by mouth 3 (three) times daily as needed for dizziness. 01/19/21   Enzo Bi, MD  metoprolol tartrate (LOPRESSOR) 25 MG tablet Take 0.5 tablets (12.5 mg total) by mouth 2 (two) times daily. 11/13/15   Charolette Forward, MD  oxyCODONE-acetaminophen (PERCOCET) 10-325 MG tablet Take 1 tablet by mouth 3 (three) times daily as needed for pain. 12/23/20   [provider]  pantoprazole (PROTONIX) 20 MG tablet Take 1 tablet (20 mg total) by mouth daily. 10/06/20   Isla Pence, MD  terazosin (HYTRIN) 1 MG capsule Take 1 mg by mouth daily. 11/23/20   [provider]    Allergies    Codeine, Niaspan [niacin er], and Statins  Review of Systems   Review of Systems  Constitutional:  Negative for appetite change, chills and fever.  HENT:  Negative for ear pain, rhinorrhea, sneezing and sore throat.   Eyes:  Negative for photophobia and visual disturbance.  Respiratory:  Negative for cough, chest tightness, shortness of breath and wheezing.   Cardiovascular:  Negative for chest pain and palpitations.  Gastrointestinal:  Negative for abdominal pain, blood in stool, constipation, diarrhea, nausea and vomiting.  Genitourinary:  Negative for dysuria, hematuria and urgency.  Musculoskeletal:  Negative for myalgias.  Skin:  Negative for rash.  Neurological:  Positive for dizziness. Negative for weakness and light-headedness.   Physical Exam Updated Vital Signs BP 124/67   Pulse 80   Temp 98.1 F (36.7 C) (Oral)   Resp (!) 22   Ht '6\' 2"'$  (1.88 m)   Wt 102.1 kg   SpO2 96%   BMI 28.89 kg/m   Physical Exam Vitals and nursing  note reviewed.  Constitutional:      General: He is not in acute distress.    Appearance: He is well-developed.  HENT:     Head: Normocephalic and atraumatic.     Nose: Nose normal.  Eyes:     General: No scleral icterus.       Right eye: No discharge.        Left eye: No discharge.     Conjunctiva/sclera: Conjunctivae normal.     Pupils: Pupils are equal, round, and reactive to light.  Cardiovascular:     Rate and Rhythm: Normal rate and regular rhythm.     Heart  sounds: Normal heart sounds. No murmur heard.   No friction rub. No gallop.  Pulmonary:     Effort: Pulmonary effort is normal. No respiratory distress.     Breath sounds: Normal breath sounds.  Abdominal:     General: Bowel sounds are normal. There is no distension.     Palpations: Abdomen is soft.     Tenderness: There is no abdominal tenderness. There is no guarding.  Musculoskeletal:        General: Normal range of motion.     Cervical back: Normal range of motion and neck supple.  Skin:    General: Skin is warm and dry.     Findings: No rash.  Neurological:     Mental Status: He is alert and oriented to person, place, and time.     Cranial Nerves: No cranial nerve deficit.     Sensory: No sensory deficit.     Motor: No abnormal muscle tone.     Coordination: Coordination normal.     Comments: Pupils reactive. No facial asymmetry noted. Cranial nerves appear grossly intact. Sensation intact to light touch on face, BUE and BLE. Strength 5/5 in BUE and BLE. Normal finger-to-nose coordination bilaterally.    ED Results / Procedures / Treatments   Labs (all labs ordered are listed, but only abnormal results are displayed) Labs Reviewed  COMPREHENSIVE METABOLIC PANEL - Abnormal; Notable for the following components:      Result Value   Glucose, Bld 103 (*)    Calcium 8.7 (*)    Total Protein 5.8 (*)    Albumin 3.2 (*)    Alkaline Phosphatase 36 (*)    All other components within normal limits  CBG  MONITORING, ED - Abnormal; Notable for the following components:   Glucose-Capillary 110 (*)    All other components within normal limits  CBC WITH DIFFERENTIAL/PLATELET  TROPONIN I (HIGH SENSITIVITY)  TROPONIN I (HIGH SENSITIVITY)    EKG EKG Interpretation  Date/Time:  Thursday July 15 2021 12:31:01 EDT Ventricular Rate:  68 PR Interval:  202 QRS Duration: 89 QT Interval:  396 QTC Calculation: 422 R Axis:   46 Text Interpretation: Sinus rhythm Low voltage, precordial leads Abnormal R-wave progression, early transition Since last ECG< no significant abnormalities, less artifact Confirmed by Gareth Morgan [id number]) on 07/15/2021 1:32:37 PM  Radiology DG Chest 2 View  Result Date: 07/15/2021 CLINICAL DATA:  Dizzy EXAM: CHEST - 2 VIEW COMPARISON:  05/10/2018 FINDINGS: The heart size and mediastinal contours are within normal limits. Both lungs are clear. No pleural effusion. No acute osseous abnormality. IMPRESSION: No acute process in the chest. Electronically Signed   By: Macy Mis M.D.   On: 07/15/2021 13:35   CT HEAD WO CONTRAST (5MM)  Result Date: 07/15/2021 CLINICAL DATA:  Vertigo in a 80 year old male. EXAM: CT HEAD WITHOUT CONTRAST TECHNIQUE: Contiguous axial images were obtained from the base of the skull through the vertex without intravenous contrast. COMPARISON:  February 7th of 2022. FINDINGS: Brain: No evidence of acute infarction, hemorrhage, hydrocephalus, extra-axial collection or mass lesion/mass effect. Signs of atrophy and chronic microvascular ischemic change as before. Vascular: No hyperdense vessel or unexpected calcification. Skull: Normal. Negative for fracture or focal lesion. Sinuses/Orbits: Visualized paranasal sinuses and orbits are unremarkable. Other: None IMPRESSION: No acute intracranial abnormality. Signs of atrophy and chronic microvascular ischemic change without change. Electronically Signed   By: Zetta Bills M.D.   On: 07/15/2021 14:23     Procedures Procedures   Medications  Ordered in ED Medications  sodium chloride 0.9 % bolus 1,000 mL (0 mLs Intravenous Stopped 07/15/21 1356)    ED Course  I have reviewed the triage vital signs and the nursing notes.  Pertinent labs & imaging results that were available during my care of the patient were reviewed by me and considered in my medical decision making (see chart for details).  Clinical Course as of 07/15/21 1524  Thu Jul 15, 2021  1319 WBC: 6.2 [HK]  1319 Hemoglobin: 13.5 [HK]  1337 Creatinine: 1.03 [HK]  1352 Sodium: 137 [HK]  1352 Troponin I (High Sensitivity): 5 [HK]  1430 CT HEAD WO CONTRAST (5MM) No acute findings, does show age-related changes. [HK]    Clinical Course User Index [HK] Delia Heady, PA-C   MDM Rules/Calculators/A&P                           80 year old male with past medical history of COPD, CAD, prior MI, hypertension, prior stroke presenting to the ED with a chief complaint of dizziness.  Over the past few weeks states that he has not been feeling well.  When he woke up today he felt like "something was off."  He went outside to do some yard work when he began feeling flushed, lightheaded and nauseous.  He went back home, checked his blood pressure and it was in the 0000000 systolic.  He then drove himself to the fire station to get help.  He does admit to decreased appetite and energy level lately, has been eating and drinking less.  Denies any prior or current chest pain, headache, numbness in arms or legs, vomiting or shortness of breath.  No specific trigger that has been causing him to not feel well.  Symptoms are worse with ambulation but still present when he is at rest.  On exam patient without neurological deficits.  No numbness or weakness on exam.  No facial asymmetry.  Speaking complete sentences without aphasia.  Lungs are clear to auscultation bilaterally.  Equal and intact distal pulses bilaterally.  CBG is normal here.  CMP is  unremarkable.  CBC without any evidence of anemia or other abnormalities.  EKG shows sinus rhythm, no changes from prior tracings, no STEMI.  Chest x-ray is unremarkable.  Troponin is negative x1.  Will recheck after additional IV fluids and remainder of work-up including delta troponin and CT of the head.  CT of the head is negative for acute abnormality.  Delta troponin pending.   Patient reports significant improvement here with IV fluids and food.  He is ambulating here without difficulty.  Suspect symptoms could be due to decreased p.o. intake and dehydration.  No signs of severe dehydration seen on blood work.  Low suspicion for stroke as he has no focal neurological deficits and ambulates without abnormalities.  Care will be handed off to oncoming provider who will reassess after delta troponin.  I anticipate discharge home with PCP and cardiology follow-up if his delta troponin is normal.    Portions of this note were generated with Dragon dictation software. Dictation errors may occur despite best attempts at proofreading.  Final Clinical Impression(s) / ED Diagnoses Final diagnoses:  Dehydration    Rx / DC Orders ED Discharge Orders     None        Delia Heady, PA-C 07/15/21 1524    Gareth Morgan, MD 07/15/21 2312

## 2021-08-08 ENCOUNTER — Encounter (HOSPITAL_COMMUNITY): Payer: Self-pay

## 2021-08-08 ENCOUNTER — Emergency Department (HOSPITAL_COMMUNITY)
Admission: EM | Admit: 2021-08-08 | Discharge: 2021-08-09 | Disposition: A | Discharge status: Home / Self Care | Payer: Medicare HMO | Attending: Emergency Medicine | Admitting: Emergency Medicine

## 2021-08-08 ENCOUNTER — Emergency Department (HOSPITAL_COMMUNITY): Payer: Medicare HMO

## 2021-08-08 DIAGNOSIS — Z7902 Long term (current) use of antithrombotics/antiplatelets: Secondary | ICD-10-CM | POA: Insufficient documentation

## 2021-08-08 DIAGNOSIS — I2510 Atherosclerotic heart disease of native coronary artery without angina pectoris: Secondary | ICD-10-CM | POA: Diagnosis not present

## 2021-08-08 DIAGNOSIS — R42 Dizziness and giddiness: Secondary | ICD-10-CM | POA: Insufficient documentation

## 2021-08-08 DIAGNOSIS — R110 Nausea: Secondary | ICD-10-CM | POA: Insufficient documentation

## 2021-08-08 DIAGNOSIS — Z79899 Other long term (current) drug therapy: Secondary | ICD-10-CM | POA: Diagnosis not present

## 2021-08-08 DIAGNOSIS — Z87891 Personal history of nicotine dependence: Secondary | ICD-10-CM | POA: Diagnosis not present

## 2021-08-08 DIAGNOSIS — I10 Essential (primary) hypertension: Secondary | ICD-10-CM | POA: Insufficient documentation

## 2021-08-08 DIAGNOSIS — J449 Chronic obstructive pulmonary disease, unspecified: Secondary | ICD-10-CM | POA: Insufficient documentation

## 2021-08-08 LAB — CBC WITH DIFFERENTIAL/PLATELET
Abs Immature Granulocytes: 0.02 10*3/uL (ref 0.00–0.07)
Basophils Absolute: 0 10*3/uL (ref 0.0–0.1)
Basophils Relative: 0 %
Eosinophils Absolute: 0.1 10*3/uL (ref 0.0–0.5)
Eosinophils Relative: 2 %
HCT: 42.8 % (ref 39.0–52.0)
Hemoglobin: 13.6 g/dL (ref 13.0–17.0)
Immature Granulocytes: 0 %
Lymphocytes Relative: 24 %
Lymphs Abs: 1.7 10*3/uL (ref 0.7–4.0)
MCH: 28.4 pg (ref 26.0–34.0)
MCHC: 31.8 g/dL (ref 30.0–36.0)
MCV: 89.4 fL (ref 80.0–100.0)
Monocytes Absolute: 0.6 10*3/uL (ref 0.1–1.0)
Monocytes Relative: 8 %
Neutro Abs: 4.7 10*3/uL (ref 1.7–7.7)
Neutrophils Relative %: 66 %
Platelets: 292 10*3/uL (ref 150–400)
RBC: 4.79 MIL/uL (ref 4.22–5.81)
RDW: 13.8 % (ref 11.5–15.5)
WBC: 7.2 10*3/uL (ref 4.0–10.5)
nRBC: 0 % (ref 0.0–0.2)

## 2021-08-08 LAB — COMPREHENSIVE METABOLIC PANEL
ALT: 18 U/L (ref 0–44)
AST: 16 U/L (ref 15–41)
Albumin: 3.3 g/dL — ABNORMAL LOW (ref 3.5–5.0)
Alkaline Phosphatase: 41 U/L (ref 38–126)
Anion gap: 7 (ref 5–15)
BUN: 10 mg/dL (ref 8–23)
CO2: 26 mmol/L (ref 22–32)
Calcium: 8.9 mg/dL (ref 8.9–10.3)
Chloride: 105 mmol/L (ref 98–111)
Creatinine, Ser: 1.03 mg/dL (ref 0.61–1.24)
GFR, Estimated: >60 mL/min (ref >60)
Glucose, Bld: 98 mg/dL (ref 70–99)
Potassium: 4 mmol/L (ref 3.5–5.1)
Sodium: 138 mmol/L (ref 135–145)
Total Bilirubin: 1 mg/dL (ref 0.3–1.2)
Total Protein: 6.1 g/dL — ABNORMAL LOW (ref 6.5–8.1)

## 2021-08-08 LAB — LIPASE, BLOOD: Lipase: 22 U/L (ref 11–51)

## 2021-08-08 LAB — TROPONIN I (HIGH SENSITIVITY): Troponin I (High Sensitivity): 8 ng/L (ref <18)

## 2021-08-08 MED ORDER — MECLIZINE HCL 25 MG PO TABS
25.0000 mg | ORAL_TABLET | Freq: Once | ORAL | Status: AC
  Administered 2021-08-08: 25 mg via ORAL
  Filled 2021-08-08: qty 1

## 2021-08-08 MED ORDER — MECLIZINE HCL 25 MG PO TABS: 25.0000 mg | ORAL_TABLET | Freq: Three times a day (TID) | ORAL | 0 refills | Status: DC | PRN

## 2021-08-08 MED ORDER — LORAZEPAM 2 MG/ML IJ SOLN
0.5000 mg | Freq: Once | INTRAMUSCULAR | Status: AC | PRN
  Administered 2021-08-08: 0.5 mg via INTRAVENOUS
  Filled 2021-08-08: qty 1

## 2021-08-08 MED ORDER — SODIUM CHLORIDE 0.9 % IV BOLUS
1000.0000 mL | Freq: Once | INTRAVENOUS | Status: AC
  Administered 2021-08-08: 1000 mL via INTRAVENOUS

## 2021-08-08 NOTE — ED Triage Notes (Signed)
Pt from home with ems c.o dizziness that started around 2pm along with nausea. Pt given '4mg'$  zofran en route with some improvement. VSS

## 2021-08-08 NOTE — ED Provider Notes (Signed)
[facility] EMERGENCY DEPARTMENT Provider Note   CSN: [id number] Arrival date & time: 08/08/21  1545     History Chief Complaint  Patient presents with   Dizziness   Nausea    Francisco Collier is a 80 y.o. male.  Francisco Collier is a 80 y.o. male with a history of hypertension, hyperlipidemia, COPD, CAD, stroke, vertigo, who presents to the emergency department for evaluation of dizziness.  Patient reports over the past 3 weeks he has been having some fluctuations in his blood pressure, typically it runs around 120, but has intermittently been in the 150s despite taking his blood pressure medicines.  He was not having any symptoms with this until this afternoon when he got up from bed around 2 PM and noticed that he felt very dizzy.  He has difficulty explaining this dizziness.  He reports it felt, like he was going to pass out but also like he was off balance, denies a room spinning sensation.  Reports it felt like his vision went fuzzy, he reports that with sitting the symptoms improved but have not completely resolved.  With this he has had associated nausea but no vomiting.  Denies headache.  No numbness, tingling or weakness in extremities.  No chest pain, shortness of breath or palpitations.  Does have prior episodes of vertigo and had work-up for this in February with negative MRI but reports this feels different.  No recent head injury.  No other aggravating or alleviating factors.  The history is provided by the patient.      Past Medical History:  Diagnosis Date   Arthritis    back   CAD (coronary artery disease)    COPD (chronic obstructive pulmonary disease) (HCC)    Diverticulitis    Esophageal stricture    Gastric ulcer    GERD (gastroesophageal reflux disease)    Hyperlipemia    Hypertension    Kidney stone    MI (myocardial infarction) ([place]) 2005   Prostatic hypertrophy    S/P orchiectomy    Shortness of breath     Patient Active Problem List    Diagnosis Date Noted   Acute severe vertigo 01/18/2021   History of CVA (cerebrovascular accident) 01/18/2021   Hypertension    Stroke ([place]) 12/28/2020   Food impaction of esophagus    Esophageal stricture    Unstable angina ([place]) 11/11/2015   Snoring 02/13/2015   Excessive daytime sleepiness 02/13/2015   Morning headache 02/13/2015   Frequent nocturnal awakening 02/13/2015   Nocturnal headaches 02/13/2015   New daily persistent headache 02/13/2015   Obesity 02/13/2015   Diverticulosis 07/29/2011   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   CAD 09/30/2009    Past Surgical History:  Procedure Laterality Date   ANGIOPLASTY  2005   Stent   APPENDECTOMY     BACK SURGERY     CARDIAC CATHETERIZATION N/A 11/12/2015   Procedure: Left Heart Cath and Coronary Angiography;  Surgeon: Charolette Forward, MD;  Location: [facility];  Service: Cardiovascular;  Laterality: N/A;   CHOLECYSTECTOMY     CHOLECYSTECTOMY     CORONARY ANGIOPLASTY     ESOPHAGOGASTRODUODENOSCOPY N/A 10/06/2020   Procedure: ESOPHAGOGASTRODUODENOSCOPY (EGD);  Surgeon: Jerene Bears, MD;  Location: [facility];  Service: Gastroenterology;  Laterality: N/A;   FOREIGN BODY REMOVAL  10/06/2020   Procedure: FOREIGN BODY REMOVAL;  Surgeon: Jerene Bears, MD;  Location: [facility];  Service: Gastroenterology;;  THYROID SURGERY         Family History  Problem Relation Age of Onset   Lung cancer Father    Colon cancer Neg Hx    Migraines Neg Hx     Social History   Tobacco Use   Smoking status: Former    Years: 40.00    Types: Cigarettes    Quit date: 12/12/1998    Years since quitting: 22.6   Smokeless tobacco: Never  Vaping Use   Vaping Use: Never used  Substance Use Topics   Alcohol use: No   Drug use: No    Home Medications Prior to Admission medications   Medication Sig Start Date End Date Taking? Authorizing Provider  amLODipine (NORVASC) 10 MG tablet  Take 10 mg by mouth daily. 10/28/20   [provider]  atorvastatin (LIPITOR) 10 MG tablet Take 10 mg by mouth daily.    [provider]  clopidogrel (PLAVIX) 75 MG tablet Take 1 tablet (75 mg total) by mouth daily. 12/30/20 12/30/21  Dagar, Meredith Staggers, MD  fenofibrate 160 MG tablet Take 160 mg by mouth daily.    [provider]  fluticasone (FLONASE) 50 MCG/ACT nasal spray Place 1 spray into both nostrils 2 (two) times daily as needed for allergies or rhinitis.     [provider]  lisinopril (PRINIVIL) 10 MG tablet Take 1 tablet (10 mg total) by mouth daily. 11/13/15   Charolette Forward, MD  meclizine (ANTIVERT) 25 MG tablet Take 1 tablet (25 mg total) by mouth 3 (three) times daily as needed for dizziness. 01/19/21   Enzo Bi, MD  metoprolol tartrate (LOPRESSOR) 25 MG tablet Take 0.5 tablets (12.5 mg total) by mouth 2 (two) times daily. 11/13/15   Charolette Forward, MD  oxyCODONE-acetaminophen (PERCOCET) 10-325 MG tablet Take 1 tablet by mouth 3 (three) times daily as needed for pain. 12/23/20   [provider]  pantoprazole (PROTONIX) 20 MG tablet Take 1 tablet (20 mg total) by mouth daily. 10/06/20   Isla Pence, MD    Allergies    Codeine, Niaspan [niacin er], and Statins  Review of Systems   Review of Systems  Constitutional:  Negative for chills and fever.  HENT: Negative.    Eyes:  Positive for visual disturbance. Negative for pain.  Respiratory:  Negative for cough and shortness of breath.   Cardiovascular:  Negative for chest pain.  Gastrointestinal:  Positive for nausea. Negative for abdominal pain and vomiting.  Genitourinary:  Negative for dysuria.  Musculoskeletal:  Negative for back pain and neck pain.  Skin:  Negative for color change and rash.  Neurological:  Positive for dizziness and light-headedness. Negative for tremors, seizures, syncope, facial asymmetry, speech difficulty, weakness, numbness and headaches.  All other systems  reviewed and are negative.  Physical Exam Updated Vital Signs BP 135/72 (BP Location: Left Arm)   Pulse 76   Temp 98 F (36.7 C) (Oral)   Resp 20   SpO2 98%   Physical Exam Vitals and nursing note reviewed.  Constitutional:      General: He is not in acute distress.    Appearance: Normal appearance. He is well-developed. He is not diaphoretic.     Comments: Elderly gentleman, alert and in no acute distress.  HENT:     Head: Normocephalic and atraumatic.     Mouth/Throat:     Mouth: Mucous membranes are moist.     Pharynx: Oropharynx is clear.  Eyes:     General:  Right eye: No discharge.        Left eye: No discharge.     Extraocular Movements: Extraocular movements intact.     Pupils: Pupils are equal, round, and reactive to light.     Comments: No nystagmus noted  Cardiovascular:     Rate and Rhythm: Normal rate and regular rhythm.     Pulses: Normal pulses.     Heart sounds: Normal heart sounds.  Pulmonary:     Effort: Pulmonary effort is normal. No respiratory distress.     Breath sounds: Normal breath sounds. No wheezing or rales.     Comments: Respirations equal and unlabored, patient able to speak in full sentences, lungs clear to auscultation bilaterally  Abdominal:     General: Bowel sounds are normal. There is no distension.     Palpations: Abdomen is soft. There is no mass.     Tenderness: There is no abdominal tenderness. There is no guarding.     Comments: Abdomen soft, nondistended, nontender to palpation in all quadrants without guarding or peritoneal signs  Musculoskeletal:        General: No deformity.     Cervical back: Normal range of motion and neck supple.     Right lower leg: No edema.     Left lower leg: No edema.  Skin:    General: Skin is warm and dry.     Capillary Refill: Capillary refill takes less than 2 seconds.  Neurological:     Mental Status: He is alert and oriented to person, place, and time.     Coordination: Coordination  normal.     Comments: Speech is clear, able to follow commands CN III-XII intact Normal strength in upper and lower extremities bilaterally including dorsiflexion and plantar flexion, strong and equal grip strength Sensation normal to light and sharp touch Moves extremities without ataxia, coordination intact Normal finger to nose and rapid alternating movements No pronator drift  Psychiatric:        Mood and Affect: Mood normal.        Behavior: Behavior normal.    ED Results / Procedures / Treatments   Labs (all labs ordered are listed, but only abnormal results are displayed) Labs Reviewed  COMPREHENSIVE METABOLIC PANEL - Abnormal; Notable for the following components:      Result Value   Total Protein 6.1 (*)    Albumin 3.3 (*)    All other components within normal limits  CBC WITH DIFFERENTIAL/PLATELET  LIPASE, BLOOD  TROPONIN I (HIGH SENSITIVITY)  TROPONIN I (HIGH SENSITIVITY)    EKG EKG Interpretation  Date/Time:  Sunday August 08 2021 16:27:55 EDT Ventricular Rate:  71 PR Interval:  188 QRS Duration: 80 QT Interval:  388 QTC Calculation: 421 R Axis:   61 Text Interpretation: Normal sinus rhythm Nonspecific ST abnormality Abnormal ECG Confirmed by Davonna Belling [phone]) on 08/08/2021 8:08:07 PM  Radiology DG Chest 2 View  Result Date: 08/08/2021 CLINICAL DATA:  Near syncope.  Former smoker. EXAM: CHEST - 2 VIEW COMPARISON:  Chest x-ray dated 07/15/2021 FINDINGS: Heart size and mediastinal contours are stable. Chronic bronchitic changes centrally. Lungs otherwise clear. No suspicious nodule or pulmonary mass is identified. No confluent opacity to suggest a developing pneumonia. No pleural effusion or pneumothorax. Osseous structures about the chest are unremarkable. IMPRESSION: 1. No active cardiopulmonary disease. No evidence of pneumonia or pulmonary edema. 2. Chronic bronchitic change. Electronically Signed   By: Franki Cabot M.D.   On: 08/08/2021 17:19  Procedures Procedures   Medications Ordered in ED Medications  LORazepam (ATIVAN) injection 0.5 mg (has no administration in time range)  sodium chloride 0.9 % bolus 1,000 mL (0 mLs Intravenous Stopped 08/08/21 1844)  meclizine (ANTIVERT) tablet 25 mg (25 mg Oral Given 08/08/21 2017)    ED Course  I have reviewed the triage vital signs and the nursing notes.  Pertinent labs & imaging results that were available during my care of the patient were reviewed by me and considered in my medical decision making (see chart for details).     MDM Rules/Calculators/A&P                          80 year old male presents with dizziness that started at 2 PM.  Patient has difficulty delineating whether this felt more like a room spinning dizziness or feeling off balance versus lightheadedness.  He has had similar episodes of vertigo but reports this felt different.  It has been constant since 2:00 with associated nausea but no vomiting.  He reports his vision has intermittently felt blurry, but denies any other associated neurologic symptoms, no headache, facial droop, speech changes, numbness or weakness.  On exam patient does not have any focal neurologic deficits.  Will evaluate for syncope versus vertigo.  Patient does have dry mucous membranes concerning for potential dehydration which could certainly lead to near syncopal symptoms.  Will check labs, EKG and IV fluids, if no improvement in symptoms after this we will consider MRI, patient had inpatient evaluation for vertigo in February with negative MRI at this time but also has prior stroke history.  I have independently ordered, reviewed and interpreted all labs and imaging: CBC: No leukocytosis and normal hemoglobin CMP: No significant electrolyte derangements and normal renal and liver function Lipase: WNL Troponin: WNL  EKG was sinus rhythm with nonspecific ST changes.  No improvement in dizziness after IV fluids, on orthostatics patient  became very dizzy and lightheaded when going from lying to sitting but did not have significant drop in his blood pressure or tachycardia.  Will proceed with meclizine and MRI to rule out stroke or mass as cause for dizziness.  At shift change MRI is pending, care signed out to [facility] [employer] who will follow up on MRI results if negative patient can be discharged home with meclizine and outpatient neurology follow-up.   Final Clinical Impression(s) / ED Diagnoses Final diagnoses:  Dizziness    Rx / DC Orders ED Discharge Orders     None        Janet Berlin 08/08/21 2303    Davonna Belling, MD 08/11/21 [phone]

## 2021-08-08 NOTE — ED Notes (Signed)
This tech got orthostatic vitals on pt. Pt stated he was very dizzy and light headed once he sat up on the side of the bed. Pt then stated he was ok once standing it was just sitting up that made him feel dizzy. Provider notified.

## 2021-08-09 NOTE — Discharge Instructions (Addendum)
Your MRI shows no evidence of stroke.  I have prescribed Meclizine to help with  your dizziness symptoms.  Please follow-up with your doctor.   [facility] should also be contacting you to make an appointment.  If your symptoms change or worsen, return to the ER.

## 2022-01-17 ENCOUNTER — Emergency Department (HOSPITAL_COMMUNITY): Payer: Medicare HMO

## 2022-01-17 ENCOUNTER — Encounter (HOSPITAL_COMMUNITY): Payer: Self-pay

## 2022-01-17 ENCOUNTER — Other Ambulatory Visit: Payer: Self-pay

## 2022-01-17 ENCOUNTER — Emergency Department (HOSPITAL_COMMUNITY)
Admission: EM | Admit: 2022-01-17 | Discharge: 2022-01-17 | Disposition: A | Discharge status: Home / Self Care | Payer: Medicare HMO | Attending: Emergency Medicine | Admitting: Emergency Medicine

## 2022-01-17 DIAGNOSIS — I2510 Atherosclerotic heart disease of native coronary artery without angina pectoris: Secondary | ICD-10-CM | POA: Insufficient documentation

## 2022-01-17 DIAGNOSIS — K469 Unspecified abdominal hernia without obstruction or gangrene: Secondary | ICD-10-CM | POA: Insufficient documentation

## 2022-01-17 DIAGNOSIS — J449 Chronic obstructive pulmonary disease, unspecified: Secondary | ICD-10-CM | POA: Diagnosis not present

## 2022-01-17 DIAGNOSIS — R1032 Left lower quadrant pain: Secondary | ICD-10-CM | POA: Diagnosis present

## 2022-01-17 DIAGNOSIS — Z79899 Other long term (current) drug therapy: Secondary | ICD-10-CM | POA: Diagnosis not present

## 2022-01-17 DIAGNOSIS — N5089 Other specified disorders of the male genital organs: Secondary | ICD-10-CM | POA: Insufficient documentation

## 2022-01-17 DIAGNOSIS — K4090 Unilateral inguinal hernia, without obstruction or gangrene, not specified as recurrent: Secondary | ICD-10-CM

## 2022-01-17 DIAGNOSIS — I10 Essential (primary) hypertension: Secondary | ICD-10-CM | POA: Diagnosis not present

## 2022-01-17 LAB — COMPREHENSIVE METABOLIC PANEL
ALT: 27 U/L (ref 0–44)
AST: 23 U/L (ref 15–41)
Albumin: 3.8 g/dL (ref 3.5–5.0)
Alkaline Phosphatase: 47 U/L (ref 38–126)
Anion gap: 6 (ref 5–15)
BUN: 17 mg/dL (ref 8–23)
CO2: 28 mmol/L (ref 22–32)
Calcium: 9.1 mg/dL (ref 8.9–10.3)
Chloride: 102 mmol/L (ref 98–111)
Creatinine, Ser: 0.87 mg/dL (ref 0.61–1.24)
GFR, Estimated: >60 mL/min (ref >60)
Glucose, Bld: 128 mg/dL — ABNORMAL HIGH (ref 70–99)
Potassium: 4 mmol/L (ref 3.5–5.1)
Sodium: 136 mmol/L (ref 135–145)
Total Bilirubin: 0.6 mg/dL (ref 0.3–1.2)
Total Protein: 6.9 g/dL (ref 6.5–8.1)

## 2022-01-17 LAB — URINALYSIS, ROUTINE W REFLEX MICROSCOPIC
Bilirubin Urine: NEGATIVE
Glucose, UA: NEGATIVE mg/dL
Hgb urine dipstick: NEGATIVE
Ketones, ur: NEGATIVE mg/dL
Nitrite: NEGATIVE
Protein, ur: NEGATIVE mg/dL
Specific Gravity, Urine: 1.03 (ref 1.005–1.030)
WBC, UA: >50 WBC/hpf — ABNORMAL HIGH (ref 0–5)
pH: 5.5 (ref 5.0–8.0)

## 2022-01-17 LAB — CBC
HCT: 43 % (ref 39.0–52.0)
Hemoglobin: 14.1 g/dL (ref 13.0–17.0)
MCH: 29.4 pg (ref 26.0–34.0)
MCHC: 32.8 g/dL (ref 30.0–36.0)
MCV: 89.6 fL (ref 80.0–100.0)
Platelets: 314 10*3/uL (ref 150–400)
RBC: 4.8 MIL/uL (ref 4.22–5.81)
RDW: 13.3 % (ref 11.5–15.5)
WBC: 6.6 10*3/uL (ref 4.0–10.5)
nRBC: 0 % (ref 0.0–0.2)

## 2022-01-17 LAB — LIPASE, BLOOD: Lipase: 27 U/L (ref 11–51)

## 2022-01-17 MED ORDER — FENTANYL CITRATE PF 50 MCG/ML IJ SOSY
75.0000 ug | PREFILLED_SYRINGE | Freq: Once | INTRAMUSCULAR | Status: AC
  Administered 2022-01-17: 75 ug via INTRAVENOUS
  Filled 2022-01-17: qty 2

## 2022-01-17 MED ORDER — IOHEXOL 300 MG/ML  SOLN
100.0000 mL | Freq: Once | INTRAMUSCULAR | Status: AC | PRN
  Administered 2022-01-17: 100 mL via INTRAVENOUS

## 2022-01-17 NOTE — ED Provider Notes (Signed)
[facility] DEPT Provider Note   CSN: [id number] Arrival date & time: 01/17/22  1028     History  Chief Complaint  Patient presents with   Abdominal Pain    Francisco Collier is a 81 y.o. male.  HPI  81 year old male with past medical history of appendectomy, cholecystectomy HTN, HLD, COPD, CAD presents to the emergency department with left-sided abdominal pain.  Patient states that this started 4 days ago.  Initially was in his mid left abdomen, has now radiated down to his left lower quadrant and groin.  While standing in the shower yesterday patient felt a large mass in his left testicle which he has not noticed before.  He denies any diarrhea, nausea/vomiting, difficulty with urination/hematuria.  No history of hernia in the past.  Home Medications Prior to Admission medications   Medication Sig Start Date End Date Taking? Authorizing Provider  amLODipine (NORVASC) 10 MG tablet Take 10 mg by mouth daily. 10/28/20   [provider]  atorvastatin (LIPITOR) 10 MG tablet Take 10 mg by mouth daily.    [provider]  fenofibrate 160 MG tablet Take 160 mg by mouth daily.    [provider]  fluticasone (FLONASE) 50 MCG/ACT nasal spray Place 1 spray into both nostrils 2 (two) times daily as needed for allergies or rhinitis.     [provider]  lisinopril (PRINIVIL) 10 MG tablet Take 1 tablet (10 mg total) by mouth daily. 11/13/15   Charolette Forward, MD  meclizine (ANTIVERT) 25 MG tablet Take 1 tablet (25 mg total) by mouth 3 (three) times daily as needed for dizziness. 08/08/21   Jacqlyn Larsen, PA-C  metoprolol tartrate (LOPRESSOR) 25 MG tablet Take 0.5 tablets (12.5 mg total) by mouth 2 (two) times daily. 11/13/15   Charolette Forward, MD  oxyCODONE-acetaminophen (PERCOCET) 10-325 MG tablet Take 1 tablet by mouth 3 (three) times daily as needed for pain. 12/23/20   [provider]  pantoprazole (PROTONIX) 20 MG tablet Take  1 tablet (20 mg total) by mouth daily. 10/06/20   Isla Pence, MD      Allergies    Codeine, Niaspan [niacin er], and Statins    Review of Systems   Review of Systems  Constitutional:  Negative for fever.  Respiratory:  Negative for shortness of breath.   Cardiovascular:  Negative for chest pain.  Gastrointestinal:  Positive for abdominal pain. Negative for blood in stool, diarrhea and vomiting.  Genitourinary:  Negative for difficulty urinating and dysuria.  Musculoskeletal:  Negative for back pain.  Skin:  Negative for rash.  Neurological:  Negative for headaches.   Physical Exam Updated Vital Signs BP 136/70 (BP Location: Left Arm)    Pulse 89    Temp 97.7 F (36.5 C) (Oral)    Resp 16    Ht 6\' 2"  (1.88 m)    Wt 97.5 kg    SpO2 97%    BMI 27.60 kg/m  Physical Exam Vitals and nursing note reviewed.  Constitutional:      Appearance: Normal appearance.  HENT:     Head: Normocephalic.     Mouth/Throat:     Mouth: Mucous membranes are moist.  Cardiovascular:     Rate and Rhythm: Normal rate.  Pulmonary:     Effort: Pulmonary effort is normal. No respiratory distress.  Abdominal:     Palpations: Abdomen is soft.     Tenderness: There is abdominal tenderness in the left lower quadrant. There is no  guarding or rebound.     Hernia: A hernia is present.  Genitourinary:    Comments: Left testicle swelling, soft, no overlying skin changes Skin:    General: Skin is warm.  Neurological:     Mental Status: He is alert and oriented to person, place, and time. Mental status is at baseline.  Psychiatric:        Mood and Affect: Mood normal.    ED Results / Procedures / Treatments   Labs (all labs ordered are listed, but only abnormal results are displayed) Labs Reviewed  CBC  LIPASE, BLOOD  COMPREHENSIVE METABOLIC PANEL  URINALYSIS, ROUTINE W REFLEX MICROSCOPIC    EKG None  Radiology No results found.  Procedures Procedures    Medications Ordered in  ED Medications  fentaNYL (SUBLIMAZE) injection 75 mcg (has no administration in time range)    ED Course/ Medical Decision Making/ A&P                           Medical Decision Making Amount and/or Complexity of Data Reviewed Labs: ordered. Radiology: ordered.  Risk Prescription drug management.   This patient presents to the ED for concern of left-sided abdominal pain/testicular swelling, this involves an extensive number of treatment options, and is a complaint that carries with it a high risk of complications and morbidity.  The differential diagnosis includes hernia, diverticulitis, abdominal infection, obstruction   Additional history obtained: -External records from outside source obtained and reviewed including: Chart review including previous notes, labs, imaging, consultation notes   Lab Tests: -I ordered, reviewed, and interpreted labs.  The pertinent results include: Baseline blood work   Imaging Studies ordered: -I ordered imaging studies including CT of the abdomen pelvis -I independently visualized and interpreted imaging which showed fat-containing left inguinal/testicular hernia, no other acute abdominal abnormality -I agree with the radiologist interpretation   Medicines ordered and prescription drug management: -I ordered medication including pain medicine for symptoms -Reevaluation of the patient after these medicines showed that the patient resolved -I have reviewed the patients home medicines and have made adjustments as needed   ED Course: 81 year old male presents emergency department with left-sided abdominal pain and more recently left testicular swelling.  No nausea/vomiting/diarrhea, no fever.  Vitals are stable here.  Abdomen is benign.  Left testicle is swollen without any discoloration, slightly tender to palpation, soft, no palpated bowel.  Blood work is reassuring and baseline.  CT of the abdomen pelvis identifies fat-containing left  inguinal/testicular hernia.  No indication for emergent surgery, plan for outpatient follow-up.   Cardiac Monitoring: The patient was maintained on a cardiac monitor.  I personally viewed and interpreted the cardiac monitored which showed an underlying rhythm of: Sinus rhythm   Reevaluation: After the interventions noted above, I reevaluated the patient and found that they have :improved   Dispostion: Patient at this time appears safe and stable for discharge and close outpatient follow up. Discharge plan and strict return to ED precautions discussed, patient verbalizes understanding and agreement.        Final Clinical Impression(s) / ED Diagnoses Final diagnoses:  None    Rx / DC Orders ED Discharge Orders     None         Lorelle Gibbs, DO 01/17/22 1530

## 2022-01-17 NOTE — ED Triage Notes (Signed)
Patient c/o LLQ abdominal pain. Patient denies any N/v/D.

## 2022-01-17 NOTE — Discharge Instructions (Signed)
You have been seen and discharged from the emergency department.  You are found to have a fat-containing inguinal hernia which is what is causing the left testicular swelling.  This may be symptomatic and tender.  We recommend wearing compressive boxers for testicular support.  This can be surgically corrected, establish care with a general surgeon for evaluation of this.  Follow-up with your primary provider for further evaluation and further care. Take home medications as prescribed. If you have any worsening symptoms or further concerns for your health please return to an emergency department for further evaluation.

## 2022-02-13 ENCOUNTER — Emergency Department (HOSPITAL_COMMUNITY): Payer: Medicare HMO

## 2022-02-13 ENCOUNTER — Other Ambulatory Visit: Payer: Self-pay

## 2022-02-13 ENCOUNTER — Inpatient Hospital Stay (HOSPITAL_COMMUNITY): Payer: Medicare HMO

## 2022-02-13 ENCOUNTER — Inpatient Hospital Stay (HOSPITAL_COMMUNITY)
Admission: EM | Admit: 2022-02-13 | Discharge: 2022-02-15 | DRG: 062 | Disposition: A | Discharge status: Home Health | Payer: Medicare HMO | Attending: Neurology | Admitting: Neurology

## 2022-02-13 ENCOUNTER — Encounter (HOSPITAL_COMMUNITY): Payer: Self-pay | Admitting: Neurology

## 2022-02-13 DIAGNOSIS — Z8711 Personal history of peptic ulcer disease: Secondary | ICD-10-CM

## 2022-02-13 DIAGNOSIS — R29703 NIHSS score 3: Secondary | ICD-10-CM | POA: Diagnosis not present

## 2022-02-13 DIAGNOSIS — F449 Dissociative and conversion disorder, unspecified: Secondary | ICD-10-CM | POA: Diagnosis present

## 2022-02-13 DIAGNOSIS — I252 Old myocardial infarction: Secondary | ICD-10-CM | POA: Diagnosis not present

## 2022-02-13 DIAGNOSIS — H510 Palsy (spasm) of conjugate gaze: Secondary | ICD-10-CM | POA: Diagnosis present

## 2022-02-13 DIAGNOSIS — R4701 Aphasia: Secondary | ICD-10-CM | POA: Diagnosis present

## 2022-02-13 DIAGNOSIS — Z888 Allergy status to other drugs, medicaments and biological substances status: Secondary | ICD-10-CM

## 2022-02-13 DIAGNOSIS — R29706 NIHSS score 6: Secondary | ICD-10-CM | POA: Diagnosis present

## 2022-02-13 DIAGNOSIS — G459 Transient cerebral ischemic attack, unspecified: Secondary | ICD-10-CM | POA: Diagnosis present

## 2022-02-13 DIAGNOSIS — Z9282 Status post administration of tPA (rtPA) in a different facility within the last 24 hours prior to admission to current facility: Secondary | ICD-10-CM

## 2022-02-13 DIAGNOSIS — I10 Essential (primary) hypertension: Secondary | ICD-10-CM | POA: Diagnosis present

## 2022-02-13 DIAGNOSIS — Z87891 Personal history of nicotine dependence: Secondary | ICD-10-CM

## 2022-02-13 DIAGNOSIS — G8191 Hemiplegia, unspecified affecting right dominant side: Secondary | ICD-10-CM | POA: Diagnosis present

## 2022-02-13 DIAGNOSIS — Z79899 Other long term (current) drug therapy: Secondary | ICD-10-CM

## 2022-02-13 DIAGNOSIS — E7800 Pure hypercholesterolemia, unspecified: Secondary | ICD-10-CM | POA: Diagnosis not present

## 2022-02-13 DIAGNOSIS — K219 Gastro-esophageal reflux disease without esophagitis: Secondary | ICD-10-CM | POA: Diagnosis present

## 2022-02-13 DIAGNOSIS — E782 Mixed hyperlipidemia: Secondary | ICD-10-CM

## 2022-02-13 DIAGNOSIS — M479 Spondylosis, unspecified: Secondary | ICD-10-CM | POA: Diagnosis present

## 2022-02-13 DIAGNOSIS — Z7902 Long term (current) use of antithrombotics/antiplatelets: Secondary | ICD-10-CM

## 2022-02-13 DIAGNOSIS — H547 Unspecified visual loss: Secondary | ICD-10-CM | POA: Diagnosis present

## 2022-02-13 DIAGNOSIS — F419 Anxiety disorder, unspecified: Secondary | ICD-10-CM | POA: Diagnosis present

## 2022-02-13 DIAGNOSIS — Z885 Allergy status to narcotic agent status: Secondary | ICD-10-CM

## 2022-02-13 DIAGNOSIS — E785 Hyperlipidemia, unspecified: Secondary | ICD-10-CM | POA: Diagnosis present

## 2022-02-13 DIAGNOSIS — E1159 Type 2 diabetes mellitus with other circulatory complications: Secondary | ICD-10-CM

## 2022-02-13 DIAGNOSIS — I3139 Other pericardial effusion (noninflammatory): Secondary | ICD-10-CM | POA: Diagnosis present

## 2022-02-13 DIAGNOSIS — Z20822 Contact with and (suspected) exposure to covid-19: Secondary | ICD-10-CM | POA: Diagnosis present

## 2022-02-13 DIAGNOSIS — Z8673 Personal history of transient ischemic attack (TIA), and cerebral infarction without residual deficits: Secondary | ICD-10-CM

## 2022-02-13 DIAGNOSIS — I639 Cerebral infarction, unspecified: Secondary | ICD-10-CM

## 2022-02-13 DIAGNOSIS — Z801 Family history of malignant neoplasm of trachea, bronchus and lung: Secondary | ICD-10-CM | POA: Diagnosis not present

## 2022-02-13 DIAGNOSIS — I2510 Atherosclerotic heart disease of native coronary artery without angina pectoris: Secondary | ICD-10-CM | POA: Diagnosis present

## 2022-02-13 DIAGNOSIS — R200 Anesthesia of skin: Secondary | ICD-10-CM | POA: Diagnosis present

## 2022-02-13 DIAGNOSIS — G510 Bell's palsy: Secondary | ICD-10-CM | POA: Diagnosis present

## 2022-02-13 DIAGNOSIS — I25119 Atherosclerotic heart disease of native coronary artery with unspecified angina pectoris: Secondary | ICD-10-CM

## 2022-02-13 DIAGNOSIS — H47619 Cortical blindness, unspecified side of brain: Secondary | ICD-10-CM | POA: Diagnosis present

## 2022-02-13 DIAGNOSIS — Z602 Problems related to living alone: Secondary | ICD-10-CM | POA: Diagnosis present

## 2022-02-13 DIAGNOSIS — I6389 Other cerebral infarction: Secondary | ICD-10-CM

## 2022-02-13 DIAGNOSIS — H5340 Unspecified visual field defects: Secondary | ICD-10-CM | POA: Diagnosis present

## 2022-02-13 LAB — ECHOCARDIOGRAM COMPLETE
AR max vel: 3.29 cm2
AV Area VTI: 3.31 cm2
AV Area mean vel: 3.21 cm2
AV Mean grad: 2 mmHg
AV Peak grad: 3 mmHg
Ao pk vel: 0.86 m/s
Area-P 1/2: 2.43 cm2
Height: 74 in
S' Lateral: 3.8 cm
Weight: 3315.72 oz

## 2022-02-13 LAB — COMPREHENSIVE METABOLIC PANEL
ALT: 32 U/L (ref 0–44)
AST: 27 U/L (ref 15–41)
Albumin: 3.7 g/dL (ref 3.5–5.0)
Alkaline Phosphatase: 48 U/L (ref 38–126)
Anion gap: 7 (ref 5–15)
BUN: 13 mg/dL (ref 8–23)
CO2: 27 mmol/L (ref 22–32)
Calcium: 9 mg/dL (ref 8.9–10.3)
Chloride: 103 mmol/L (ref 98–111)
Creatinine, Ser: 1.03 mg/dL (ref 0.61–1.24)
GFR, Estimated: >60 mL/min (ref >60)
Glucose, Bld: 114 mg/dL — ABNORMAL HIGH (ref 70–99)
Potassium: 4.2 mmol/L (ref 3.5–5.1)
Sodium: 137 mmol/L (ref 135–145)
Total Bilirubin: 0.4 mg/dL (ref 0.3–1.2)
Total Protein: 6.3 g/dL — ABNORMAL LOW (ref 6.5–8.1)

## 2022-02-13 LAB — RAPID URINE DRUG SCREEN, HOSP PERFORMED
Amphetamines: NOT DETECTED
Barbiturates: NOT DETECTED
Benzodiazepines: NOT DETECTED
Cocaine: NOT DETECTED
Opiates: NOT DETECTED
Tetrahydrocannabinol: NOT DETECTED

## 2022-02-13 LAB — PROTIME-INR
INR: 1 (ref 0.8–1.2)
Prothrombin Time: 13.3 seconds (ref 11.4–15.2)

## 2022-02-13 LAB — I-STAT CHEM 8, ED
BUN: 15 mg/dL (ref 8–23)
Calcium, Ion: 1.16 mmol/L (ref 1.15–1.40)
Chloride: 101 mmol/L (ref 98–111)
Creatinine, Ser: 0.9 mg/dL (ref 0.61–1.24)
Glucose, Bld: 106 mg/dL — ABNORMAL HIGH (ref 70–99)
HCT: 42 % (ref 39.0–52.0)
Hemoglobin: 14.3 g/dL (ref 13.0–17.0)
Potassium: 4.2 mmol/L (ref 3.5–5.1)
Sodium: 139 mmol/L (ref 135–145)
TCO2: 28 mmol/L (ref 22–32)

## 2022-02-13 LAB — CBC
HCT: 43 % (ref 39.0–52.0)
Hemoglobin: 14.3 g/dL (ref 13.0–17.0)
MCH: 29.9 pg (ref 26.0–34.0)
MCHC: 33.3 g/dL (ref 30.0–36.0)
MCV: 89.8 fL (ref 80.0–100.0)
Platelets: 280 10*3/uL (ref 150–400)
RBC: 4.79 MIL/uL (ref 4.22–5.81)
RDW: 13.4 % (ref 11.5–15.5)
WBC: 6.3 10*3/uL (ref 4.0–10.5)
nRBC: 0 % (ref 0.0–0.2)

## 2022-02-13 LAB — RESP PANEL BY RT-PCR (FLU A&B, COVID) ARPGX2
Influenza A by PCR: NEGATIVE
Influenza B by PCR: NEGATIVE
SARS Coronavirus 2 by RT PCR: NEGATIVE

## 2022-02-13 LAB — URINALYSIS, COMPLETE (UACMP) WITH MICROSCOPIC
Bilirubin Urine: NEGATIVE
Glucose, UA: NEGATIVE mg/dL
Hgb urine dipstick: NEGATIVE
Ketones, ur: NEGATIVE mg/dL
Leukocytes,Ua: NEGATIVE
Nitrite: NEGATIVE
Protein, ur: NEGATIVE mg/dL
Specific Gravity, Urine: 1.025 (ref 1.005–1.030)
pH: 6 (ref 5.0–8.0)

## 2022-02-13 LAB — DIFFERENTIAL
Abs Immature Granulocytes: 0.01 10*3/uL (ref 0.00–0.07)
Basophils Absolute: 0 10*3/uL (ref 0.0–0.1)
Basophils Relative: 0 %
Eosinophils Absolute: 0.1 10*3/uL (ref 0.0–0.5)
Eosinophils Relative: 2 %
Immature Granulocytes: 0 %
Lymphocytes Relative: 31 %
Lymphs Abs: 1.9 10*3/uL (ref 0.7–4.0)
Monocytes Absolute: 0.5 10*3/uL (ref 0.1–1.0)
Monocytes Relative: 8 %
Neutro Abs: 3.7 10*3/uL (ref 1.7–7.7)
Neutrophils Relative %: 59 %

## 2022-02-13 LAB — MRSA NEXT GEN BY PCR, NASAL: MRSA by PCR Next Gen: NOT DETECTED

## 2022-02-13 LAB — CBG MONITORING, ED: Glucose-Capillary: 111 mg/dL — ABNORMAL HIGH (ref 70–99)

## 2022-02-13 LAB — APTT: aPTT: 27 seconds (ref 24–36)

## 2022-02-13 MED ORDER — IOHEXOL 350 MG/ML SOLN
75.0000 mL | Freq: Once | INTRAVENOUS | Status: AC | PRN
  Administered 2022-02-13: 75 mL via INTRAVENOUS

## 2022-02-13 MED ORDER — ACETAMINOPHEN 650 MG RE SUPP: 650.0000 mg | RECTAL | Status: DC | PRN

## 2022-02-13 MED ORDER — SODIUM CHLORIDE 0.9% FLUSH
3.0000 mL | Freq: Once | INTRAVENOUS | Status: AC
  Administered 2022-02-13: 3 mL via INTRAVENOUS

## 2022-02-13 MED ORDER — FENOFIBRATE 160 MG PO TABS
160.0000 mg | ORAL_TABLET | Freq: Every day | ORAL | Status: DC
  Administered 2022-02-14 – 2022-02-15 (×2): 160 mg via ORAL
  Filled 2022-02-13 (×2): qty 1

## 2022-02-13 MED ORDER — SODIUM CHLORIDE 0.9 % IV SOLN: INTRAVENOUS | Status: DC

## 2022-02-13 MED ORDER — ATORVASTATIN CALCIUM 10 MG PO TABS
10.0000 mg | ORAL_TABLET | Freq: Every day | ORAL | Status: DC
  Administered 2022-02-14 – 2022-02-15 (×2): 10 mg via ORAL
  Filled 2022-02-13 (×2): qty 1

## 2022-02-13 MED ORDER — PANTOPRAZOLE SODIUM 40 MG IV SOLR: 40.0000 mg | Freq: Every day | INTRAVENOUS | Status: DC

## 2022-02-13 MED ORDER — ACETAMINOPHEN 160 MG/5ML PO SOLN: 650.0000 mg | ORAL | Status: DC | PRN

## 2022-02-13 MED ORDER — PERFLUTREN LIPID MICROSPHERE
1.0000 mL | INTRAVENOUS | Status: AC | PRN
  Administered 2022-02-13: 3 mL via INTRAVENOUS
  Filled 2022-02-13: qty 10

## 2022-02-13 MED ORDER — STROKE: EARLY STAGES OF RECOVERY BOOK
Freq: Once | Status: AC
  Filled 2022-02-13: qty 1

## 2022-02-13 MED ORDER — SENNOSIDES-DOCUSATE SODIUM 8.6-50 MG PO TABS: 1.0000 | ORAL_TABLET | Freq: Every evening | ORAL | Status: DC | PRN

## 2022-02-13 MED ORDER — PANTOPRAZOLE SODIUM 40 MG PO TBEC
40.0000 mg | DELAYED_RELEASE_TABLET | Freq: Every day | ORAL | Status: DC
  Administered 2022-02-13 – 2022-02-15 (×3): 40 mg via ORAL
  Filled 2022-02-13 (×3): qty 1

## 2022-02-13 MED ORDER — ACETAMINOPHEN 325 MG PO TABS
650.0000 mg | ORAL_TABLET | ORAL | Status: DC | PRN
  Administered 2022-02-13 – 2022-02-15 (×3): 650 mg via ORAL
  Filled 2022-02-13 (×3): qty 2

## 2022-02-13 MED ORDER — TENECTEPLASE FOR STROKE
0.2500 mg/kg | PACK | Freq: Once | INTRAVENOUS | Status: AC
  Administered 2022-02-13: 25 mg via INTRAVENOUS
  Filled 2022-02-13: qty 10

## 2022-02-13 NOTE — Progress Notes (Signed)
PHARMACIST CODE STROKE RESPONSE ? ?Notified to mix TNK at 1237 by Dr. Erlinda Hong ?Delivered TNK to RN at 1238 ? ?TNK dose = 25 mg IV over 5 seconds.  ? ?Issues/delays encountered (if applicable):  ? ?Bertis Ruddy ?02/13/22 12:40 PM ?

## 2022-02-13 NOTE — Progress Notes (Signed)
Belongings: Pt wearing glasses and gold colored watch. He has his cell phone. Pants and shoes (with keys and a wallet in pocket) in belonging bag placed in closet. ?

## 2022-02-13 NOTE — Progress Notes (Signed)
Echo attempted. Patient eating. Will attempt again as schedule permits. ?

## 2022-02-13 NOTE — H&P (Addendum)
Code Stroke Neurology H&P Note  CC: Patient from home via EMS as code stroke for right sided weakness and numbness  The history was obtained from the EMS, patient and chart.  During history and examination, all items were able to obtain unless otherwise noted.  History of Present Illness:  Francisco Collier is an 81 y.o. Caucasian male with PMH of prior stroke/TIAs with tPA given last year, CAD, COPD, GERD, MI in 2005, HTN, HLD, gastric ulcer and esophageal stricture who presented to [facility] ED via EMS as a code stroke.   Per Francisco Collier, he lives alone and is independent at baseline, wife is deceased. He woke up this am without any issues and carried on as per usual. When he went to sit down to watch TV around 1055 and around 11 am noticed his right leg "felt like it weighed 200 lbs" and right arm was weak with trace right facial numbness. He called EMS. His B/P was 140's/80's.  A code stroke was called and pt was taken straight to CT scan upon arrival. It was determined he was a good candidate for TNK and this was given after [facility] showed no acute process.   While in CT patient was alert and oriented x 3. Followed commands. Noted deficits were right eye upper visual cut and right leg able to lift slightly but falls back to stretcher.  He was admitted to Neuro ICU s/p TNK.  Date last known well: Date: 02/13/2022 Time last known well: Time: 10:55 tPA Given: Yes Prior to admission MRS:  0 Initial NIHSS:  6 Post TNK NIHSS: 3  Interval: Shift assessment (03/05 1400) Level of Consciousness (1a.)   : Alert, keenly responsive (03/05 1415) LOC Questions (1b. )   +: Answers both questions correctly (03/05 1415) LOC Commands (1c. )   + : Performs both tasks correctly (03/05 1415) Best Gaze (2. )  +: Normal (03/05 1415) Visual (3. )  +: Partial hemianopia (03/05 1415) Facial Palsy (4. )    : Normal symmetrical movements (03/05 1415) Motor Arm, Left (5a. )   +: No drift (03/05 1415) Motor Arm,  Right (5b. )   +: No drift (03/05 1415) Motor Leg, Left (6a. )   +: No drift (03/05 1415) Motor Leg, Right (6b. )   +: Some effort against gravity (03/05 1415) Limb Ataxia (7. ): Absent (03/05 1415) Sensory (8. )   +: Normal, no sensory loss (03/05 1415) Best Language (9. )   +: No aphasia (03/05 1415) Dysarthria (10. ): Normal (03/05 1415) Extinction/Inattention (11.)   +: No Abnormality (03/05 1415) Modified SS Total  +: 3 (03/05 1415) Complete NIHSS TOTAL: 3 (03/05 1415)  Past Medical History:  Diagnosis Date   Arthritis    back   CAD (coronary artery disease)    COPD (chronic obstructive pulmonary disease) (HCC)    Diverticulitis    Esophageal stricture    Gastric ulcer    GERD (gastroesophageal reflux disease)    Hyperlipemia    Hypertension    Kidney stone    MI (myocardial infarction) ([place]) 2005   Prostatic hypertrophy    S/P orchiectomy    Shortness of breath      Past Surgical History:  Procedure Laterality Date   ANGIOPLASTY  2005   Stent   APPENDECTOMY     BACK SURGERY     CARDIAC CATHETERIZATION N/A 11/12/2015   Procedure: Left Heart Cath and Coronary Angiography;  Surgeon: Charolette Forward, MD;  Location: [place] [facility];  Service: Cardiovascular;  Laterality: N/A;   CHOLECYSTECTOMY     CHOLECYSTECTOMY     CORONARY ANGIOPLASTY     ESOPHAGOGASTRODUODENOSCOPY N/A 10/06/2020   Procedure: ESOPHAGOGASTRODUODENOSCOPY (EGD);  Surgeon: Jerene Bears, MD;  Location: [facility];  Service: Gastroenterology;  Laterality: N/A;   FOREIGN BODY REMOVAL  10/06/2020   Procedure: FOREIGN BODY REMOVAL;  Surgeon: Jerene Bears, MD;  Location: [facility];  Service: Gastroenterology;;   THYROID SURGERY      Family History  Problem Relation Age of Onset   Lung cancer Father    Colon cancer Neg Hx    Migraines Neg Hx      Social History:  reports that he quit smoking about 23 years ago. He has never used smokeless tobacco. He reports that he does not drink alcohol and  does not use drugs.  Review of Systems: A full ROS was attempted today and was able to be performed.  Systems assessed include - Constitutional, Eyes, HENT, Respiratory, Cardiovascular, Gastrointestinal, Genitourinary, Integument/breast, Hematologic/lymphatic, Musculoskeletal, Neurological, Behavioral/Psych, Endocrine, Allergic/Immunologic - with pertinent responses as per HPI.  Allergies:  Allergies  Allergen Reactions   Codeine Nausea Only    Pill form causes nausea Iv form does not   Niaspan [Niacin Er] Other (See Comments)    Severe flushing   Statins Rash    Headaches      Medications: I have reviewed the patient's current medications. Prior to Admission:  Medications Prior to Admission  Medication Sig Dispense Refill Last Dose   amLODipine (NORVASC) 10 MG tablet Take 10 mg by mouth daily.   02/13/2022   atorvastatin (LIPITOR) 10 MG tablet Take 10 mg by mouth daily.   02/13/2022   clopidogrel (PLAVIX) 75 MG tablet Take 75 mg by mouth daily.   02/13/2022   diclofenac Sodium (VOLTAREN) 1 % GEL Apply 2 g topically 4 (four) times daily.   02/13/2022   fenofibrate 160 MG tablet Take 160 mg by mouth daily.   02/13/2022   fluticasone (FLONASE) 50 MCG/ACT nasal spray Place 1 spray into both nostrils 2 (two) times daily as needed for allergies or rhinitis.    More than a month ago   lisinopril (PRINIVIL) 10 MG tablet Take 1 tablet (10 mg total) by mouth daily. 30 tablet 3 02/13/2022   meclizine (ANTIVERT) 25 MG tablet Take 1 tablet (25 mg total) by mouth 3 (three) times daily as needed for dizziness. 30 tablet 0 More than a month ago   metoprolol tartrate (LOPRESSOR) 25 MG tablet Take 0.5 tablets (12.5 mg total) by mouth 2 (two) times daily. 60 tablet 3 02/13/2022 at 0700   oxyCODONE-acetaminophen (PERCOCET) 10-325 MG tablet Take 1 tablet by mouth 3 (three) times daily as needed for pain.   02/13/2022   pantoprazole (PROTONIX) 20 MG tablet Take 1 tablet (20 mg total) by mouth daily. (Patient taking  differently: Take 20 mg by mouth daily as needed for heartburn or indigestion.) 30 tablet 0    Scheduled:   stroke: mapping our early stages of recovery book   Does not apply Once   [START ON 02/14/2022] atorvastatin  10 mg Oral Daily   [START ON 02/14/2022] fenofibrate  160 mg Oral Daily   pantoprazole  40 mg Oral Daily   Continuous:  sodium chloride     IEP:PIRJJOACZYSAY **OR** acetaminophen (TYLENOL) oral liquid 160 mg/5 mL **OR** acetaminophen, senna-docusate  Test Results: CBC:  Recent Labs  Lab 02/13/22 1229 02/13/22 1235  WBC 6.3  --   NEUTROABS 3.7  --   HGB 14.3 14.3  HCT 43.0 42.0  MCV 89.8  --   PLT 280  --    Basic Metabolic Panel:  Recent Labs  Lab 02/13/22 1229 02/13/22 1235  NA 137 139  K 4.2 4.2  CL 103 101  CO2 27  --   GLUCOSE 114* 106*  BUN 13 15  CREATININE 1.03 0.90  CALCIUM 9.0  --    Liver Function Tests: Recent Labs  Lab 02/13/22 1229  AST 27  ALT 32  ALKPHOS 48  BILITOT 0.4  PROT 6.3*  ALBUMIN 3.7   No results for input(s): LIPASE, AMYLASE in the last 168 hours. No results for input(s): AMMONIA in the last 168 hours. Coagulation Studies:  Recent Labs    02/13/22 1229  LABPROT 13.3  INR 1.0   Cardiac Enzymes: No results for input(s): CKTOTAL, CKMB, CKMBINDEX, TROPONINI in the last 168 hours. BNP: Invalid input(s): POCBNP CBG:  Recent Labs  Lab 02/13/22 1226  GLUCAP 111*   Urinalysis: No results for input(s): COLORURINE, LABSPEC, PHURINE, GLUCOSEU, HGBUR, BILIRUBINUR, KETONESUR, PROTEINUR, UROBILINOGEN, NITRITE, LEUKOCYTESUR in the last 168 hours.  Invalid input(s): Delta Microbiology:  Results for orders placed or performed during the hospital encounter of 01/18/21  SARS CORONAVIRUS 2 (TAT 6-24 HRS) Nasopharyngeal Nasopharyngeal Swab     Status: None   Collection Time: 01/18/21  6:16 AM   Specimen: Nasopharyngeal Swab  Result Value Ref Range Status   SARS Coronavirus 2 NEGATIVE NEGATIVE Final    Comment:  (NOTE) SARS-CoV-2 target nucleic acids are NOT DETECTED.  The SARS-CoV-2 RNA is generally detectable in upper and lower respiratory specimens during the acute phase of infection. Negative results do not preclude SARS-CoV-2 infection, do not rule out co-infections with other pathogens, and should not be used as the sole basis for treatment or other patient management decisions. Negative results must be combined with clinical observations, patient history, and epidemiological information. The expected result is Negative.  Fact Sheet for Patients: SugarRoll.be  Fact Sheet for Healthcare Providers: https://www.woods-mathews.com/  This test is not yet approved or cleared by the [place] FDA and  has been authorized for detection and/or diagnosis of SARS-CoV-2 by FDA under an Emergency Use Authorization (EUA). This EUA will remain  in effect (meaning this test can be used) for the duration of the COVID-19 declaration under Se ction 564(b)(1) of the Act, 21 U.S.C. section 360bbb-3(b)(1), unless the authorization is terminated or revoked sooner.  Performed at [facility], [place] [address], [place], [place] [address]    Lipid Panel:     Component Value Date/Time   CHOL 100 12/29/2020 0314   TRIG 79 12/29/2020 0314   HDL 29 (L) 12/29/2020 0314   CHOLHDL 3.4 12/29/2020 0314   VLDL 16 12/29/2020 0314   LDLCALC 55 12/29/2020 0314   HgbA1c:  Lab Results  Component Value Date   HGBA1C 4.7 (L) 12/29/2020   Urine Drug Screen:     Component Value Date/Time   LABOPIA NONE DETECTED 12/29/2020 0807   COCAINSCRNUR NONE DETECTED 12/29/2020 0807   LABBENZ NONE DETECTED 12/29/2020 0807   AMPHETMU NONE DETECTED 12/29/2020 0807   THCU NONE DETECTED 12/29/2020 0807   LABBARB NONE DETECTED 12/29/2020 0807    Alcohol Level: No results for input(s): ETH in the last 168 hours.  CT Abdomen Pelvis W Contrast  Result Date: 01/17/2022 CLINICAL  DATA:  Abdominal pain. Abdominal pain. LEFT lower quadrant  pain radiating to testicle EXAM: CT ABDOMEN AND PELVIS WITH CONTRAST TECHNIQUE: Multidetector CT imaging of the abdomen and pelvis was performed using the standard protocol following bolus administration of intravenous contrast. RADIATION DOSE REDUCTION: This exam was performed according to the departmental dose-optimization program which includes automated exposure control, adjustment of the mA and/or kV according to patient size and/or use of iterative reconstruction technique. CONTRAST:  11m OMNIPAQUE IOHEXOL 300 MG/ML  SOLN COMPARISON:  None. FINDINGS: Lower chest: Lung bases are clear. Hepatobiliary: No focal hepatic lesion. Postcholecystectomy. No biliary dilatation. Pancreas: Pancreas is normal. No ductal dilatation. No pancreatic inflammation. Spleen: Normal spleen Adrenals/urinary tract: Adrenal glands and kidneys are normal. The ureters and bladder normal. Stomach/Bowel: Stomach, small bowel, appendix, and cecum are normal. The colon and rectosigmoid colon are normal. Vascular/Lymphatic: Abdominal aorta is normal caliber with atherosclerotic calcification. There is no retroperitoneal or periportal lymphadenopathy. No pelvic lymphadenopathy. Reproductive: Bulge along the LEFT margin of the prostate gland (image 88/2). Other: Fat filled LEFT inguinal hernia. Musculoskeletal: No aggressive osseous lesion. IMPRESSION: 1. No clear explanation for LEFT lower quadrant pain. No diverticulosis. NO obstructive uropathy. 2. Fat filled LEFT inguinal hernia without complicating features. 3. Asymmetric bulge along the lateral aspect of the LEFT lobe of the prostate gland. Recommend correlation with PSA level. Electronically Signed   By: SSuzy BouchardM.D.   On: 01/17/2022 13:42   CT HEAD CODE STROKE WO CONTRAST  Result Date: 02/13/2022 CLINICAL DATA:  Code stroke. Neuro deficit, acute, stroke suspected. EXAM: CT HEAD WITHOUT CONTRAST TECHNIQUE:  Contiguous axial images were obtained from the base of the skull through the vertex without intravenous contrast. RADIATION DOSE REDUCTION: This exam was performed according to the departmental dose-optimization program which includes automated exposure control, adjustment of the mA and/or kV according to patient size and/or use of iterative reconstruction technique. COMPARISON:  Brain MRI 08/08/2021. FINDINGS: Brain: Generalized cerebral atrophy. Mild-to-moderate patchy and ill-defined hypoattenuation within the cerebral white matter, nonspecific but compatible with chronic small vessel ischemic disease. There is no acute intracranial hemorrhage. No demarcated cortical infarct. No extra-axial fluid collection. No evidence of an intracranial mass. No midline shift. Vascular: No hyperdense vessel.  Atherosclerotic calcifications. Skull: Normal. Negative for fracture or focal lesion. Sinuses/Orbits: Visualized orbits show no acute finding. No significant paranasal sinus disease at the imaged levels. 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 These results were communicated to Dr. XErlinda HongAt 12:50 pmon 3/5/2023by text page via the [facility] Corporationmessaging system. IMPRESSION: No evidence of acute intracranial abnormality. Mild-to-moderate chronic small vessel ischemic changes within the cerebral white matter. Generalized cerebral atrophy. 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 Electronically Signed   By: KKellie SimmeringD.O.   On: 02/13/2022 12:51   CT ANGIO HEAD NECK W WO CM (CODE STROKE)  Result Date: 02/13/2022 CLINICAL DATA:  Neuro deficit, acute,  stroke suspected. EXAM: CT ANGIOGRAPHY HEAD AND NECK TECHNIQUE: Multidetector CT imaging of the head and neck was performed using the standard protocol during bolus administration of intravenous contrast. Multiplanar CT image reconstructions and MIPs were obtained to evaluate the vascular anatomy. Carotid stenosis measurements (when applicable) are obtained utilizing NASCET criteria, using the distal internal carotid diameter  as the denominator. RADIATION DOSE REDUCTION: This exam was performed according to the departmental dose-optimization program which includes automated exposure control, adjustment of the mA and/or kV according to patient size and/or use of iterative reconstruction technique. CONTRAST:  Administered contrast not known at this time. COMPARISON:  Noncontrast head CT performed earlier today 02/13/2022. CT angiogram head/neck 12/28/2020. Thyroid ultrasound 04/06/2010. FINDINGS: CTA NECK FINDINGS Aortic arch: Standard aortic branching. The visualized aortic arch is normal in caliber. Atherosclerotic plaque within the proximal A2 branch vessels of the neck. No hemodynamically significant innominate or proximal subclavian artery stenosis. Right carotid system: CCA and ICA patent within the neck without stenosis. Mild atherosclerotic plaque about the carotid bifurcation. Left carotid system: CCA and ICA patent within the neck without stenosis. Mild atherosclerotic plaque about the carotid bifurcation. Vertebral arteries: Vertebral arteries codominant and patent within the neck. As before, there is moderate narrowing of the V2 left vertebral artery at the C3-C4 level due to mass effect from adjacent degenerative bony spurring. Skeleton: Cervical spondylosis and multilevel facet joint ankylosis. No acute bony abnormality or aggressive osseous lesion. 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 Prior right  hemithyroidectomy. 1.6 cm nodule within the left thyroid lobe, new and/or increased in size as compared to the prior thyroid ultrasound of 04/06/2010. Upper chest: No consolidation within the imaged lung apices. Centrilobular and paraseptal emphysema. Review of the MIP images confirms the above findings CTA HEAD FINDINGS Anterior circulation: The intracranial internal carotid arteries are patent. Atherosclerotic plaque within both vessels. No more than mild stenosis of the intracranial right ICA. As before, there is up to moderate stenosis within the distal cavernous and paraclinoid left ICA. The M1 middle cerebral arteries are patent. Atherosclerotic irregularity of the M2 and more distal MCA vessels, bilaterally. No M2 proximal branch occlusion or high-grade proximal stenosis is identified. The anterior cerebral arteries are patent. No intracranial aneurysm is identified. Posterior circulation: The intracranial vertebral arteries are patent. Atherosclerotic plaque within the V4 vertebral arteries bilaterally with no more than mild stenosis. The basilar artery is patent. The posterior cerebral arteries are patent. Redemonstrated sites of mild stenosis within the P2 posterior cerebral arteries, bilaterally. Posterior communicating arteries are diminutive or absent bilaterally. Venous sinuses: Within the limitations of contrast timing, no convincing thrombus. Anatomic variants: As described. Review of the MIP images confirms the above findings No emergent large vessel occlusion identified. These results were called by telephone at the time of interpretation on 02/13/2022 at 1:07 pm to provider Dr. Erlinda Hong, who verbally acknowledged these results. IMPRESSION: CTA neck: 1. Common carotid and internal carotid arteries are patent within the neck without hemodynamically significant stenosis. Mild atherosclerotic plaque about both carotid bifurcations. 2. Vertebral arteries patent within the neck. Unchanged moderate narrowing of  the V2 left vertebral artery at the C3-C4 level due to mass effect from adjacent degenerative bony spurring. 3. Redemonstrated sizable mass within the right parotid gland and right parapharyngeal space, as described on multiple prior examinations. Primary differential considerations include a low-flow vascular malformation versus neoplasm. 4. 1.6 cm nodule within the left thyroid lobe, new and/or increased in size as compared to the prior thyroid ultrasound of 04/06/2010. A Non-emergent thyroid ultrasound recommended for further evaluation. CTA head: 1. No intracranial large vessel occlusion is identified. 2. Intracranial atherosclerotic disease, as described. Most notably, there is up to moderate stenosis within the distal cavernous/paraclinoid left ICA. Electronically Signed   By: Marylyn Ishihara  Armandina Gemma D.O.   On: 02/13/2022 13:08     EKG: pending.   Physical Examination: Temp:  [97.9 F (36.6 C)] 97.9 F (36.6 C) (03/05 1230) Pulse Rate:  [79-88] 88 (03/05 1300) Resp:  [16-24] 24 (03/05 1300) BP: (122-135)/(65-82) 128/81 (03/05 1300) SpO2:  [97 %] 97 % (03/05 1300) Weight:  [100.2 kg] 100.2 kg (03/05 1200)  General - Well nourished, well developed, in no apparent distress.  Ophthalmologic - fundi not visualized due to noncooperation.  Cardiovascular - Regular rate and rhythm.  Mental Status -  Level of arousal and orientation to time, place, and person were intact. Language including expression, naming, repetition, comprehension was assessed and found intact. Attention span and concentration were normal. Recent and remote memory were intact. Fund of Knowledge was assessed and was intact.  Cranial Nerves II - XII - II - Visual field deficit noted in right eye upper visual field . III, IV, VI - Extraocular movements intact. V - Facial sensation intact bilaterally. VII - Facial movement intact bilaterally. VIII - Hearing & vestibular intact bilaterally. X - Palate elevates symmetrically. XI  - Chin turning & shoulder shrug intact bilaterally but weaker in right side. XII - Tongue protrusion intact.  Motor Strength - The patients strength was normal 5/5 RUE,RLE and pronator drift was absent. 4/5 LUE and 1/5 LLE. Bulk was normal and fasciculations were absent.   Motor Tone - Muscle tone was assessed at the neck and appendages and was normal.  Reflexes - The patients reflexes were symmetrical in all extremities and he had no pathological reflexes.  Sensory - Light touch, temperature/pinprick were assessed and were symmetrical.  Initially he had right facial numbness that resolved in CT.  Coordination - The patient had normal movements in the hands and feet with no ataxia or dysmetria.  Tremor was absent.  Gait and Station - deferred.   Assessment:  Francisco Collier is a 81 y.o. male with history of prior stroke/TIAs with TNK given last year, CAD, COPD, GERD, MI in 2005, HTN, HLD, gastric ulcer and esophageal stricture presenting with right sided weakness, right sided facial numbness and right upper visual field cut. He did receive IV t-PA due to meeting inclusions. Per Francisco Collier, he lives alone and is independent at baseline, wife is deceased. He woke up this am without any issues and carried on as per usual. When he went to sit down to watch TV around 1055 and around 11 am noticed his right leg "felt like it weighed 200 lbs" and right arm was weak with trace right facial numbness. He called EMS. His B/P was 140's/80's.  A code stroke was called and pt was taken straight to CT scan upon arrival. It was determined he was a good candidate for TNK and this was given after [facility] showed no acute process.   While in CT patient was alert and oriented x 3. Followed commands. Noted deficits were right eye upper visual cut and right leg able to lift slightly but falls back to stretcher.  He was admitted to Neuro ICU s/p TNK.   Suspect left brain stroke s/p TNK CT head NAP Code Stroke  CT head No acute stroke. Small vessel disease. Atrophy.   CTA head & neck No intracranial large vessel occlusion is identified.intracranial atherosclerotic disease,  cavernous/paraclinoid left ICA. MRI  pending 2D Echo pending LDL 55 HgbA1c 4.7  SCDs for VTE prophylaxis No antithrombotic prior to admission, now on No antithrombotic. S/P TNK Therapy recommendations:  PT/OT/SLP to see Disposition:  TBD but likely home with improving right sided weakness  Hypertension Stable BP goal < 180/105 post TNK Long-term BP goal normotensive  Hyperlipidemia Home meds:  Lipitor 10 mg, resumed in hospital LDL 55, goal < 70 High intensity statin not indicated  Continue statin at discharge  Other Stroke Risk Factors Advanced age Hx stroke/TIA Coronary artery disease Migraines Question of sleep apnea, patient with snoring, excessive daytime sleepiness not on CPAP, will need outpatient follow up.   Hospital day # 0   JESSICA Evalina Field, NP 02/13/22 1:25 PM  ATTENDING NOTE: I reviewed above note and agree with the assessment and plan. Pt was seen and examined.   81 year old male with history of hypertension, hyperlipidemia, diabetes, CAD/MI status post stenting in 2005 presented to ED for code stroke.  Per patient and EMS, he was watching TV at home alone at baseline 10:55 AM.  11 AM patient tried to get up from a chair and was not able to.  He had right leg and right arm weakness numbness.  EMS was called.  On arrival patient still has right-sided deficit, BP 130-140/80, glucose 102.  He was sent to ER for evaluation.  He was admitted in 12/2020 for headache and right-sided weakness status post tPA and symptom improved significantly after tPA and close to baseline.  CT/MRI no acute finding.  CT head and neck no LVO.  EF 55 to 60%.  LDL 55, A1c 4.7.  Discharged on DAPT for 3 weeks, Lipitor and fenofibrate.  In 01/2021 he was again admitted for right-sided weakness and confusion, MRI whole spine  showed spinal stenosis but no spinal cord compression.  On presentation today, patient awake alert, eyes open, orientated to age, place, time. No aphasia, fluent language, following all simple commands. Able to name and repeat. No gaze palsy, tracking bilaterally, visual field exam showed right upper quadrantanopsia. No facial droop. Tongue midline.  Left upper and lower extremity 5/5, right upper extremity mild drift but not which in bed within 10 seconds, right lower extremity 2/5 proximal and distal. b/l FTN intact, gait not tested.   NIH Stroke Scale  Level Of Consciousness 0=Alert; keenly responsive 1=Arouse to minor stimulation 2=Requires repeated stimulation to arouse or movements to pain 3=postures or unresponsive 0  LOC Questions to Month and Age 38=Answers both questions correctly 1=Answers one question correctly or dysarthria/intubated/trauma/language barrier 2=Answers neither question correctly or aphasia 0  LOC Commands      -Open/Close eyes     -Open/close grip     -Pantomime commands if communication barrier 0=Performs both tasks correctly 1=Performs one task correctly 2=Performs neighter task correctly 0  Best Gaze     -Only assess horizontal gaze 0=Normal 1=Partial gaze palsy 2=Forced deviation, or total gaze paresis 0  Visual 0=No visual loss 1=Partial hemianopia 2=Complete hemianopia 3=Bilateral hemianopia (blind including cortical blindness) 1  Facial Palsy     -Use grimace if obtunded 0=Normal symmetrical movement 1=Minor paralysis (asymmetry) 2=Partial paralysis (lower face) 3=Complete paralysis (upper and lower face) 0  Motor  0=No drift for 10/5 seconds 1=Drift, but does not hit bed 2=Some antigravity effort, hits  bed 3=No effort against gravity, limb falls 4=No movement 0=Amputation/joint fusion Right Arm 1     Leg 3    Left Arm 0     Leg 0  Limb Ataxia     - FNT/HTS 0=Absent or does not understand or paralyzed or amputation/joint fusion 1=Present  in one limb 2=Present in two limbs  0  Sensory 0=Normal 1=Mild to moderate sensory loss 2=Severe to total sensory loss or coma/unresponsive 1  Best Language 0=No aphasia, normal 1=Mild to moderate aphasia 2=Severe aphasia 3=Mute, global aphasia, or coma/unresponsive 0  Dysarthria 0=Normal 1=Mild to moderate 2=Severe, unintelligible or mute/anarthric 0=intubated/unable to test 0  Extinction/Neglect 0=No abnormality 1=visual/tactile/auditory/spatia/personal inattention/Extinction to bilateral simultaneous stimulation 2=Profound neglect/extinction more than 1 modality  0  Total   6    Patient presented within TNK window, no contraindication for TNK, discussed with patient about benefits and risk of TNK, he agreed to proceed.  TNK was given.  After TNK injection, CT head and neck performed and there is no LVO.  Patient will be admitted to ICU for post TNK care.    Admit to 4N ICU for routine post IV thrombolytics care  Check blood pressure and NIHSS every 15 min for 2 h, then every 30 min for 6 h, and finally every hour for 16 h BP goal < 180/105 CT or MRI brain 24 hours post thrombolytics Stat CT head without contrast if acute neuro changes No antiplatelet agents or anticoagulants (including heparin for DVT prophylaxis) in first 24 hours No Foley catheter, nasogastric tube, arterial catheter or central venous catheter for 24 hours, unless absolutely necessary Telemetry Euglycemia  Avoid hyperthermia, PRN acetaminophen DVT prophylaxis with SCDs Discussed with ED physician  For detailed assessment and plan, please refer to above as I have made changes wherever appropriate.   Rosalin Hawking, MD PhD Stroke Neurology 02/13/2022 5:51 PM    To contact Stroke Continuity provider, please refer to [email or web address] After hours, contact General Neurology

## 2022-02-13 NOTE — ED Provider Notes (Signed)
[facility]NORTH NEURO/TRAUMA/SURGICAL ICU Provider Note   CSN: [id number] Arrival date & time: 02/13/22  1224  An emergency department physician performed an initial assessment on this suspected stroke patient at 1227.  History  Chief Complaint  Patient presents with   Code Stroke    Francisco Collier is a 81 y.o. male.  HPI Patient came [facility] as a code stroke.  Was normal at home and then at 11 AM developed some weakness on his right side difficulty getting out of the chair due to right leg weakness.  Previous stroke and has previously had tPA.  No headache.  Met by Dr.Xu from neurology and myself upon arrival.  Good blood pressure.  Sugar reassuring.  Some weakness on his right hand also.    Home Medications Prior to Admission medications   Medication Sig Start Date End Date Taking? Authorizing Provider  amLODipine (NORVASC) 10 MG tablet Take 10 mg by mouth daily. 10/28/20  Yes [provider]  atorvastatin (LIPITOR) 10 MG tablet Take 10 mg by mouth daily.   Yes [provider]  clopidogrel (PLAVIX) 75 MG tablet Take 75 mg by mouth daily. 01/12/22  Yes [provider]  diclofenac Sodium (VOLTAREN) 1 % GEL Apply 2 g topically 4 (four) times daily. 01/20/22  Yes [provider]  fenofibrate 160 MG tablet Take 160 mg by mouth daily.   Yes [provider]  fluticasone (FLONASE) 50 MCG/ACT nasal spray Place 1 spray into both nostrils 2 (two) times daily as needed for allergies or rhinitis.    Yes [provider]  lisinopril (PRINIVIL) 10 MG tablet Take 1 tablet (10 mg total) by mouth daily. 11/13/15  Yes Charolette Forward, MD  meclizine (ANTIVERT) 25 MG tablet Take 1 tablet (25 mg total) by mouth 3 (three) times daily as needed for dizziness. 08/08/21  Yes Jacqlyn Larsen, PA-C  metoprolol tartrate (LOPRESSOR) 25 MG tablet Take 0.5 tablets (12.5 mg total) by mouth 2 (two) times daily. 11/13/15  Yes Charolette Forward, MD  oxyCODONE-acetaminophen (PERCOCET)  10-325 MG tablet Take 1 tablet by mouth 3 (three) times daily as needed for pain. 12/23/20  Yes [provider]  pantoprazole (PROTONIX) 20 MG tablet Take 1 tablet (20 mg total) by mouth daily. Patient taking differently: Take 20 mg by mouth daily as needed for heartburn or indigestion. 10/06/20  Yes Isla Pence, MD      Allergies    Codeine, Niaspan [niacin er], and Statins    Review of Systems   Review of Systems  Neurological:  Positive for headaches.   Physical Exam Updated Vital Signs BP 129/75    Pulse 80    Temp 98.2 F (36.8 C) (Oral)    Resp (!) 29    Ht '6\' 2"'  (1.88 m)    Wt 94 kg    SpO2 93%    BMI 26.61 kg/m  Physical Exam Vitals and nursing note reviewed.  HENT:     Head: Atraumatic.  Eyes:     Extraocular Movements: Extraocular movements intact.     Pupils: Pupils are equal, round, and reactive to light.  Cardiovascular:     Rate and Rhythm: Regular rhythm.  Musculoskeletal:        General: No tenderness.     Cervical back: Neck supple.  Skin:    General: Skin is warm.     Capillary Refill: Capillary refill takes less than 2 seconds.  Neurological:     Mental Status: He is alert and  oriented to person, place, and time.     Comments: Examined alongside neurology.  Right eye upper outer visual field cut.  Some potential weakness in right upper extremity but did have some giveaway in both upper extremities.  Weakness on right lower extremity compared to left.  Awake and appropriate.  Complete NIH scoring done by neurology.    ED Results / Procedures / Treatments   Labs (all labs ordered are listed, but only abnormal results are displayed) Labs Reviewed  COMPREHENSIVE METABOLIC PANEL - Abnormal; Notable for the following components:      Result Value   Glucose, Bld 114 (*)    Total Protein 6.3 (*)    All other components within normal limits  URINALYSIS, COMPLETE (UACMP) WITH MICROSCOPIC - Abnormal; Notable for the following components:   Bacteria,  UA RARE (*)    All other components within normal limits  I-STAT CHEM 8, ED - Abnormal; Notable for the following components:   Glucose, Bld 106 (*)    All other components within normal limits  CBG MONITORING, ED - Abnormal; Notable for the following components:   Glucose-Capillary 111 (*)    All other components within normal limits  RESP PANEL BY RT-PCR (FLU A&B, COVID) ARPGX2  MRSA NEXT GEN BY PCR, NASAL  PROTIME-INR  APTT  CBC  DIFFERENTIAL  HEMOGLOBIN A1C  LIPID PANEL  CBC  BASIC METABOLIC PANEL  RAPID URINE DRUG SCREEN, HOSP PERFORMED    EKG None  Radiology ECHOCARDIOGRAM COMPLETE  Result Date: 02/13/2022    ECHOCARDIOGRAM REPORT   Patient Name:   Francisco Collier Date of Exam: 02/13/2022 Medical Rec #:  [id number]       Height:       74.0 in Accession #:    [id number]      Weight:       207.2 lb Date of Birth:  1941/02/18       BSA:          2.207 m Patient Age:    62 years        BP:           117/63 mmHg Patient Gender: M               HR:           78 bpm. Exam Location:  Inpatient Procedure: 2D Echo, Cardiac Doppler, Color Doppler and Intracardiac            Opacification Agent Indications:    Stroke  History:        Patient has prior history of Echocardiogram examinations, most                 recent 12/29/2020. CAD and Previous Myocardial Infarction, COPD;                 Risk Factors:Diabetes, Hypertension and Dyslipidemia. GERD. Hx                 CVA.  Sonographer:    Clayton Lefort RDCS (AE) Referring Phys: [id number] JESSICA L JONES  Sonographer Comments: Technically difficult study due to poor echo windows. Image acquisition challenging due to COPD. IMPRESSIONS  1. Left ventricular ejection fraction, by estimation, is 60 to 65%. The left ventricle has normal function. The left ventricle demonstrates regional wall motion abnormalities (see scoring diagram/findings for description). Left ventricular diastolic parameters are consistent with Grade I diastolic dysfunction (impaired  relaxation).  2. Right ventricular systolic function is normal. The right ventricular  size is normal.  3. A small pericardial effusion is present. The pericardial effusion is anterior to the right ventricle. There is no evidence of cardiac tamponade.  4. The mitral valve is normal in structure. No evidence of mitral valve regurgitation. No evidence of mitral stenosis.  5. The aortic valve is tricuspid. There is mild calcification of the aortic valve. Aortic valve regurgitation is not visualized. Aortic valve sclerosis is present, with no evidence of aortic valve stenosis.  6. Aortic dilatation noted. There is mild dilatation of the ascending aorta, measuring 40 mm.  7. The inferior vena cava is normal in size with greater than 50% respiratory variability, suggesting right atrial pressure of 3 mmHg. Conclusion(s)/Recommendation(s): No intracardiac source of embolism detected on this transthoracic study. Consider a transesophageal echocardiogram to exclude cardiac source of embolism if clinically indicated. FINDINGS  Left Ventricle: Left ventricular ejection fraction, by estimation, is 60 to 65%. The left ventricle has normal function. The left ventricle demonstrates regional wall motion abnormalities. Definity contrast agent was given IV to delineate the left ventricular endocardial borders. The left ventricular internal cavity size was normal in size. There is no left ventricular hypertrophy. Left ventricular diastolic parameters are consistent with Grade I diastolic dysfunction (impaired relaxation).  LV Wall Scoring: The basal inferolateral segment is normal. Right Ventricle: The right ventricular size is normal. No increase in right ventricular wall thickness. Right ventricular systolic function is normal. Left Atrium: Left atrial size was normal in size. Right Atrium: Right atrial size was normal in size. Pericardium: A small pericardial effusion is present. The pericardial effusion is anterior to the right  ventricle. There is no evidence of cardiac tamponade. Presence of epicardial fat layer. Mitral Valve: The mitral valve is normal in structure. No evidence of mitral valve regurgitation. No evidence of mitral valve stenosis. Tricuspid Valve: The tricuspid valve is normal in structure. Tricuspid valve regurgitation is not demonstrated. No evidence of tricuspid stenosis. Aortic Valve: The aortic valve is tricuspid. There is mild calcification of the aortic valve. Aortic valve regurgitation is not visualized. Aortic valve sclerosis is present, with no evidence of aortic valve stenosis. Aortic valve mean gradient measures 2.0 mmHg. Aortic valve peak gradient measures 3.0 mmHg. Aortic valve area, by VTI measures 3.31 cm. Pulmonic Valve: The pulmonic valve was normal in structure. Pulmonic valve regurgitation is not visualized. No evidence of pulmonic stenosis. Aorta: Aortic dilatation noted. There is mild dilatation of the ascending aorta, measuring 40 mm. Venous: The inferior vena cava is normal in size with greater than 50% respiratory variability, suggesting right atrial pressure of 3 mmHg. IAS/Shunts: No atrial level shunt detected by color flow Doppler.  LEFT VENTRICLE PLAX 2D LVIDd:         5.30 cm   Diastology LVIDs:         3.80 cm   LV e' medial:    6.40 cm/s LV PW:         1.10 cm   LV E/e' medial:  12.1 LV IVS:        1.00 cm   LV e' lateral:   4.99 cm/s LVOT diam:     2.00 cm   LV E/e' lateral: 15.5 LV SV:         56 LV SV Index:   25 LVOT Area:     3.14 cm  RIGHT VENTRICLE RV S prime:     14.20 cm/s TAPSE (M-mode): 1.9 cm LEFT ATRIUM  Index        RIGHT ATRIUM           Index LA diam:      2.50 cm 1.13 cm/m   RA Area:     10.70 cm LA Vol (A4C): 40.7 ml 18.45 ml/m  RA Volume:   20.50 ml  9.29 ml/m  AORTIC VALVE AV Area (Vmax):    3.29 cm AV Area (Vmean):   3.21 cm AV Area (VTI):     3.31 cm AV Vmax:           86.10 cm/s AV Vmean:          58.700 cm/s AV VTI:            0.169 m AV Peak Grad:       3.0 mmHg AV Mean Grad:      2.0 mmHg LVOT Vmax:         90.20 cm/s LVOT Vmean:        59.900 cm/s LVOT VTI:          0.178 m LVOT/AV VTI ratio: 1.05  AORTA Ao Root diam: 3.50 cm Ao Asc diam:  4.00 cm MITRAL VALVE MV Area (PHT): 2.43 cm    SHUNTS MV Decel Time: 312 msec    Systemic VTI:  0.18 m MV E velocity: 77.40 cm/s  Systemic Diam: 2.00 cm MV A velocity: 87.40 cm/s MV E/A ratio:  0.89 Candee Furbish MD Electronically signed by Candee Furbish MD Signature Date/Time: 02/13/2022/4:53:23 PM    Final    CT HEAD CODE STROKE WO CONTRAST  Result Date: 02/13/2022 CLINICAL DATA:  Code stroke. Neuro deficit, acute, stroke suspected. EXAM: CT HEAD WITHOUT CONTRAST TECHNIQUE: Contiguous axial images were obtained from the base of the skull through the vertex without intravenous contrast. RADIATION DOSE REDUCTION: This exam was performed according to the departmental dose-optimization program which includes automated exposure control, adjustment of the mA and/or kV according to patient size and/or use of iterative reconstruction technique. COMPARISON:  Brain MRI 08/08/2021. FINDINGS: Brain: Generalized cerebral atrophy. Mild-to-moderate patchy and ill-defined hypoattenuation within the cerebral white matter, nonspecific but compatible with chronic small vessel ischemic disease. There is no acute intracranial hemorrhage. No demarcated cortical infarct. No extra-axial fluid collection. No evidence of an intracranial mass. No midline shift. Vascular: No hyperdense vessel.  Atherosclerotic calcifications. Skull: Normal. Negative for fracture or focal lesion. Sinuses/Orbits: Visualized orbits show no acute finding. No significant paranasal sinus disease at the imaged levels. 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 These results were communicated to Dr. Erlinda Hong At 12:50 pmon 3/5/2023by text page via the [facility] messaging system. IMPRESSION: No evidence of acute intracranial abnormality. Mild-to-moderate chronic small vessel ischemic changes within the cerebral white matter. Generalized cerebral atrophy. 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 Electronically Signed   By: Kellie Simmering D.O.   On: 02/13/2022 12:51   CT ANGIO HEAD NECK W WO CM (CODE STROKE)  Result Date: 02/13/2022 CLINICAL DATA:  Neuro deficit, acute, stroke suspected. EXAM: CT ANGIOGRAPHY HEAD AND NECK TECHNIQUE: Multidetector CT imaging of the head and neck was performed using the standard protocol during bolus administration of intravenous contrast. Multiplanar CT  image reconstructions and MIPs were obtained to evaluate the vascular anatomy. Carotid stenosis measurements (when applicable) are obtained utilizing NASCET criteria, using the distal internal carotid diameter as the denominator. RADIATION DOSE REDUCTION: This exam was performed according to the departmental dose-optimization program which includes automated exposure control, adjustment of the mA and/or kV according to patient size and/or use of iterative reconstruction technique. CONTRAST:  Administered contrast not known at this time. COMPARISON:  Noncontrast head CT performed earlier today 02/13/2022. CT angiogram head/neck 12/28/2020. Thyroid ultrasound 04/06/2010. FINDINGS: CTA NECK FINDINGS Aortic arch: Standard aortic branching. The visualized aortic arch is normal in caliber. Atherosclerotic plaque within the proximal A2 branch vessels of the neck. No hemodynamically significant innominate or proximal subclavian artery stenosis. Right carotid system: CCA and ICA patent within the neck without stenosis. Mild atherosclerotic plaque  about the carotid bifurcation. Left carotid system: CCA and ICA patent within the neck without stenosis. Mild atherosclerotic plaque about the carotid bifurcation. Vertebral arteries: Vertebral arteries codominant and patent within the neck. As before, there is moderate narrowing of the V2 left vertebral artery at the C3-C4 level due to mass effect from adjacent degenerative bony spurring. Skeleton: Cervical spondylosis and multilevel facet joint ankylosis. No acute bony abnormality or aggressive osseous lesion. 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 Prior right hemithyroidectomy. 1.6 cm nodule within the left thyroid lobe, new and/or increased in size as compared to the prior thyroid ultrasound of 04/06/2010. Upper chest: No consolidation within the imaged lung apices. Centrilobular and paraseptal emphysema. Review of the MIP images confirms the above findings CTA HEAD FINDINGS Anterior circulation: The intracranial internal carotid arteries are patent. Atherosclerotic plaque within both vessels. No more than mild stenosis of the intracranial right ICA. As before, there is up to moderate stenosis within the distal cavernous and paraclinoid left ICA. The M1 middle cerebral arteries are patent. Atherosclerotic irregularity of the M2 and more distal MCA vessels, bilaterally. No M2 proximal branch occlusion or high-grade proximal stenosis is identified. The anterior cerebral arteries are patent. No intracranial aneurysm is identified. Posterior circulation: The intracranial vertebral arteries are patent. Atherosclerotic plaque within the V4 vertebral arteries bilaterally with no more than mild stenosis. The basilar artery is patent. The posterior cerebral arteries are patent. Redemonstrated sites of mild stenosis within the P2 posterior cerebral arteries, bilaterally. Posterior communicating arteries are diminutive or  absent bilaterally. Venous sinuses: Within the limitations of contrast timing, no convincing thrombus. Anatomic variants: As described. Review of the MIP images confirms the above findings No emergent large vessel occlusion identified. These results were called by telephone at the time of interpretation on 02/13/2022 at 1:07 pm to provider Dr. Erlinda Hong, who verbally acknowledged these results. IMPRESSION: CTA neck: 1. Common carotid and internal carotid arteries are patent within the neck without hemodynamically significant stenosis. Mild atherosclerotic plaque about both carotid bifurcations. 2. Vertebral arteries patent within the neck. Unchanged moderate narrowing of the V2 left vertebral artery at the C3-C4 level due to mass effect from adjacent degenerative bony spurring. 3. Redemonstrated sizable mass within the right parotid gland and right parapharyngeal space, as described on multiple prior examinations. Primary differential considerations include a low-flow vascular malformation versus neoplasm. 4. 1.6 cm nodule within the left thyroid lobe, new and/or increased in size as compared to the prior thyroid ultrasound of 04/06/2010. A Non-emergent thyroid ultrasound recommended for further evaluation. CTA head: 1. No intracranial large vessel occlusion is identified.  2. Intracranial atherosclerotic disease, as described. Most notably, there is up to moderate stenosis within the distal cavernous/paraclinoid left ICA. Electronically Signed   By: Kellie Simmering D.O.   On: 02/13/2022 13:08    Procedures Procedures    Medications Ordered in ED Medications  atorvastatin (LIPITOR) tablet 10 mg (has no administration in time range)  fenofibrate tablet 160 mg (has no administration in time range)  0.9 %  sodium chloride infusion (has no administration in time range)  acetaminophen (TYLENOL) tablet 650 mg (has no administration in time range)    Or  acetaminophen (TYLENOL) 160 MG/5ML solution 650 mg (has no  administration in time range)    Or  acetaminophen (TYLENOL) suppository 650 mg (has no administration in time range)  senna-docusate (Senokot-S) tablet 1 tablet (has no administration in time range)  pantoprazole (PROTONIX) EC tablet 40 mg (40 mg Oral Given 02/13/22 1414)  perflutren lipid microspheres (DEFINITY) IV suspension (3 mLs Intravenous Given 02/13/22 1649)  sodium chloride flush (NS) 0.9 % injection 3 mL (3 mLs Intravenous Given 02/13/22 1239)  tenecteplase (TNKASE) injection for Stroke 25 mg (25 mg Intravenous Given 02/13/22 1239)  iohexol (OMNIPAQUE) 350 MG/ML injection 75 mL (75 mLs Intravenous Contrast Given 02/13/22 1254)   stroke: mapping our early stages of recovery book ( Does not apply Given 02/13/22 1414)    ED Course/ Medical Decision Making/ A&P                           Medical Decision Making Problems Addressed: Cerebrovascular accident (CVA), unspecified mechanism ([place]): acute illness or injury that poses a threat to life or bodily functions  Amount and/or Complexity of Data Reviewed Labs: ordered. Decision-making details documented in ED Course. Radiology: ordered and independent interpretation performed. Discussion of management or test interpretation with external provider(s): Dr.Xu  Risk Decision regarding hospitalization.  Critical Care Total time providing critical care: 30-74 minutes  Patient presented as a code stroke.  Met by myself and neurology.  Last normal at 11 AM.  Initial head CT reassuring but does had right-sided deficits and visual field cut.  tPA given and will be admitted to ICU.  Head CT independently interpreted by me.        Final Clinical Impression(s) / ED Diagnoses Final diagnoses:  Cerebrovascular accident (CVA), unspecified mechanism [facility])    Rx / Abanda Orders ED Discharge Orders     None         Davonna Belling, MD 02/13/22 [phone]

## 2022-02-13 NOTE — ED Triage Notes (Signed)
Pt from home as code stroke-LSN 1100 today. Woke up his usual self and ambulated around his house and then tried to get out of a chair and had significant weakness to R leg and R arm, requiring significant assistance to get from chair to stretcher. Hx CVA.  ?

## 2022-02-13 NOTE — Progress Notes (Signed)
?  Echocardiogram ?2D Echocardiogram has been performed. ? ?Francisco Collier ?02/13/2022, 4:47 PM ?

## 2022-02-14 ENCOUNTER — Inpatient Hospital Stay (HOSPITAL_COMMUNITY): Payer: Medicare HMO

## 2022-02-14 DIAGNOSIS — G459 Transient cerebral ischemic attack, unspecified: Principal | ICD-10-CM

## 2022-02-14 DIAGNOSIS — E782 Mixed hyperlipidemia: Secondary | ICD-10-CM | POA: Diagnosis not present

## 2022-02-14 LAB — CBC
HCT: 41.1 % (ref 39.0–52.0)
Hemoglobin: 13.6 g/dL (ref 13.0–17.0)
MCH: 29.2 pg (ref 26.0–34.0)
MCHC: 33.1 g/dL (ref 30.0–36.0)
MCV: 88.4 fL (ref 80.0–100.0)
Platelets: 282 10*3/uL (ref 150–400)
RBC: 4.65 MIL/uL (ref 4.22–5.81)
RDW: 13.6 % (ref 11.5–15.5)
WBC: 8.2 10*3/uL (ref 4.0–10.5)
nRBC: 0 % (ref 0.0–0.2)

## 2022-02-14 LAB — BASIC METABOLIC PANEL
Anion gap: 9 (ref 5–15)
BUN: 13 mg/dL (ref 8–23)
CO2: 27 mmol/L (ref 22–32)
Calcium: 9.3 mg/dL (ref 8.9–10.3)
Chloride: 104 mmol/L (ref 98–111)
Creatinine, Ser: 1.05 mg/dL (ref 0.61–1.24)
GFR, Estimated: >60 mL/min (ref >60)
Glucose, Bld: 92 mg/dL (ref 70–99)
Potassium: 4.2 mmol/L (ref 3.5–5.1)
Sodium: 140 mmol/L (ref 135–145)

## 2022-02-14 LAB — HEMOGLOBIN A1C
Hgb A1c MFr Bld: 4.6 % — ABNORMAL LOW (ref 4.8–5.6)
Mean Plasma Glucose: 85.32 mg/dL

## 2022-02-14 LAB — LIPID PANEL
Cholesterol: 97 mg/dL (ref 0–200)
HDL: 31 mg/dL — ABNORMAL LOW (ref >40)
LDL Cholesterol: 51 mg/dL (ref 0–99)
Total CHOL/HDL Ratio: 3.1 RATIO
Triglycerides: 76 mg/dL (ref <150)
VLDL: 15 mg/dL (ref 0–40)

## 2022-02-14 MED ORDER — LORAZEPAM 2 MG/ML IJ SOLN
1.0000 mg | Freq: Once | INTRAMUSCULAR | Status: AC
  Administered 2022-02-14: 1 mg via INTRAVENOUS
  Filled 2022-02-14: qty 1

## 2022-02-14 MED ORDER — CLOPIDOGREL BISULFATE 75 MG PO TABS
75.0000 mg | ORAL_TABLET | Freq: Every day | ORAL | Status: DC
Start: 2022-02-14 — End: 2022-02-15
  Administered 2022-02-14 – 2022-02-15 (×2): 75 mg via ORAL
  Filled 2022-02-14 (×2): qty 1

## 2022-02-14 MED ORDER — CHLORHEXIDINE GLUCONATE CLOTH 2 % EX PADS
6.0000 | MEDICATED_PAD | Freq: Every day | CUTANEOUS | Status: DC
  Administered 2022-02-14: 6 via TOPICAL

## 2022-02-14 NOTE — Progress Notes (Signed)
PT Cancellation Note ? ?Patient Details ?Name: Francisco Collier ?MRN: [id number] ?DOB: Dec 25, 1940 ? ? ?Cancelled Treatment:    Reason Eval/Treat Not Completed: Active bedrest order. Will follow later this afternoon.  ? ?Leighton Roach, PT  ?Acute Rehab Services ? Pager [phone] ?Office [phone] ? ? ? ?[place] ?02/14/2022, 9:25 AM ?

## 2022-02-14 NOTE — Progress Notes (Signed)
SLP Cancellation Note ? ?Patient Details ?Name: Francisco Collier ?MRN: [id number] ?DOB: 18-Aug-1941 ? ? ?Cancelled treatment:       Reason Eval/Treat Not Completed: Patient at procedure or test/unavailable (Pt being assessed by neurology at this time. SLP will follow up.) ? ?Feleshia Zundel I. Hardin Negus, [place], CCC-SLP ?Acute Rehabilitation Services ?Office number [phone] ?Pager [phone] ? ?[name] ?02/14/2022, 9:55 AM ?

## 2022-02-14 NOTE — Evaluation (Signed)
Physical Therapy Evaluation Patient Details Name: Francisco Collier MRN: [id number] DOB: 1941/10/15 Today's Date: 02/14/2022  History of Present Illness  Pt is a 81 yo male who presents with RUE and LE weakness, given TNK.  MRI neg for acute findings. PMHx of CVA with TPA, CAD, MI s/p PTCA of RCA in 2005 with chronic dual antiplatelet therapy, DM II, GERD, HLD, HTN, and COPD.  Clinical Impression  Pt admitted with above diagnosis. PT eval limited by dizziness and orthostasis. Pt received Ativan for MRI which seems to have increased dizziness but he endorses spells of dizziness at home PTA as well. Pt fell to L hitting L shoulder day before coming in. Pt having soreness L shoulder and low back. Pt needed mod A with sit>stand from w/c and had problem solving issues doing this. Min A needed to pivot to bed where pt immediately needed to sit fir dizziness. Pt stood again, from bed, with min A. Could maintain <1 min before needing to sit again. Pt with poor ability to relay recent medical history and with limited insight into safety concerns. Have concerns about him going home alone, however, will follow tomorrow when Ativan is not confounding results.  Pt currently with functional limitations due to the deficits listed below (see PT Problem List). Pt will benefit from skilled PT to increase their independence and safety with mobility to allow discharge to the venue listed below.          Recommendations for follow up therapy are one component of a multi-disciplinary discharge planning process, led by the attending physician.  Recommendations may be updated based on patient status, additional functional criteria and insurance authorization.  Follow Up Recommendations Home health PT for balance program    Assistance Recommended at Discharge Intermittent Supervision/Assistance  Patient can return home with the following  A lot of help with walking and/or transfers;A little help with  bathing/dressing/bathroom;Assistance with cooking/housework;Assist for transportation;Help with stairs or ramp for entrance    Equipment Recommendations None recommended by PT  Recommendations for Other Services  OT consult    Functional Status Assessment Patient has had a recent decline in their functional status and demonstrates the ability to make significant improvements in function in a reasonable and predictable amount of time.     Precautions / Restrictions Precautions Precautions: Fall Precaution Comments: pt fell to L, hitting L shoulder, the day before admission. He in unsure why, denies dizziness. Reports he got himself back up Restrictions Weight Bearing Restrictions: No      Mobility  Bed Mobility Overal bed mobility: Modified Independent             General bed mobility comments: pt able to get into bed independently. However, once in supine continued to have such dizziness that he could not read menu to order lunch. Assisted him in calling and then cafe reading off his options    Transfers Overall transfer level: Needs assistance Equipment used: Rolling walker (2 wheels) Transfers: Sit to/from Stand, Bed to chair/wheelchair/BSC Sit to Stand: Mod assist, Min assist   Step pivot transfers: Min assist       General transfer comment: pt received in w/c and needed mod A to stand up from this with fwd lean and difficulty getting his feet under him. After turning to bed and taking seated rest due to dizziness, stood again from bed and pt was able to stand with min A and vc's for hand placement    Ambulation/Gait  General Gait Details: deferred due to dizziness  Stairs            Wheelchair Mobility    Modified Rankin (Stroke Patients Only) Modified Rankin (Stroke Patients Only) Pre-Morbid Rankin Score: No symptoms Modified Rankin: Moderately severe disability     Balance Overall balance assessment: Needs  assistance Sitting-balance support: Feet supported, No upper extremity supported Sitting balance-Leahy Scale: Good Sitting balance - Comments: maintain L lean in sitting, when brought to pt's attention he was able to correct but pt unaware on his own Postural control: Left lateral lean Standing balance support: Bilateral upper extremity supported, During functional activity Standing balance-Leahy Scale: Poor Standing balance comment: heavily reliant on UE support                             Pertinent Vitals/Pain Pain Assessment Pain Assessment: Faces Faces Pain Scale: Hurts little more Pain Location: back and L shoulder Pain Descriptors / Indicators: Aching, Sore Pain Intervention(s): Limited activity within patient's tolerance, Monitored during session    Home Living Family/patient expects to be discharged to:: Private residence Living Arrangements: Alone Available Help at Discharge: Family;Friend(s);Available PRN/intermittently Type of Home: Mobile home Home Access: Ramped entrance       Home Layout: One level Home Equipment: Wheelchair - manual;Cane - single Barista (2 wheels);Shower seat Additional Comments: lives with his [place], [place]. Brother lives about 15 minutes away and checks on him. Has some neighbors that can help as well.    Prior Function Prior Level of Function : Independent/Modified Independent;Driving             Mobility Comments: reports that he usually uses no AD, grabs SPC if he feels he needs it       Hand Dominance   Dominant Hand: Right    Extremity/Trunk Assessment   Upper Extremity Assessment Upper Extremity Assessment: Defer to OT evaluation    Lower Extremity Assessment Lower Extremity Assessment: Overall WFL for tasks assessed (R=L)    Cervical / Trunk Assessment Cervical / Trunk Assessment: Normal  Communication   Communication: No difficulties  Cognition Arousal/Alertness: Lethargic, Suspect due  to medications Behavior During Therapy: WFL for tasks assessed/performed Overall Cognitive Status: Impaired/Different from baseline Area of Impairment: Memory, Following commands, Safety/judgement                     Memory: Decreased short-term memory Following Commands: Follows one step commands consistently, Follows one step commands with increased time Safety/Judgement: Decreased awareness of safety, Decreased awareness of deficits     General Comments: pt received Ativan for MRI and is having dizziness and mild lethargy. He cannot give a concise history of medical events over past year and needs cues for safety. Also lacking insight into limitations        General Comments General comments (skin integrity, edema, etc.): BP 99/66    Exercises     Assessment/Plan    PT Assessment Patient needs continued PT services  PT Problem List Decreased activity tolerance;Decreased balance;Decreased mobility;Pain;Decreased knowledge of use of DME;Decreased safety awareness;Cardiopulmonary status limiting activity       PT Treatment Interventions DME instruction;Gait training;Functional mobility training;Therapeutic activities;Therapeutic exercise;Balance training;Cognitive remediation;Patient/family education    PT Goals (Current goals can be found in the Care Plan section)  Acute Rehab PT Goals Patient Stated Goal: return home PT Goal Formulation: With patient Time For Goal Achievement: 02/28/22 Potential to Achieve Goals: Good    Frequency  Min 4X/week     Co-evaluation               AM-PAC PT "6 Clicks" Mobility  Outcome Measure Help needed turning from your back to your side while in a flat bed without using bedrails?: None Help needed moving from lying on your back to sitting on the side of a flat bed without using bedrails?: None Help needed moving to and from a bed to a chair (including a wheelchair)?: A Little Help needed standing up from a chair using your  arms (e.g., wheelchair or bedside chair)?: A Lot Help needed to walk in hospital room?: Total Help needed climbing 3-5 steps with a railing? : Total 6 Click Score: 15    End of Session Equipment Utilized During Treatment: Gait belt Activity Tolerance: Treatment limited secondary to medical complications (Comment) (dizziness) Patient left: in bed;with bed alarm set;with call bell/phone within reach Nurse Communication: Mobility status PT Visit Diagnosis: Unsteadiness on feet (R26.81);Difficulty in walking, not elsewhere classified (R26.2);Dizziness and giddiness (R42);Pain Pain - Right/Left: Right Pain - part of body: Shoulder (back)    Time: 1120-1150 PT Time Calculation (min) (ACUTE ONLY): 30 min   Charges:   PT Evaluation $PT Eval Moderate Complexity: 1 Mod PT Treatments $Therapeutic Activity: 8-22 mins        [facility]  Pager [phone] Office [place] 02/14/2022, 1:05 PM

## 2022-02-14 NOTE — TOC CAGE-AID Note (Signed)
Transition of Care (TOC) - CAGE-AID Screening ? ? ?Patient Details  ?Name: Francisco Collier ?MRN: [id number] ?Date of Birth: [birth year] ? ?Transition of Care (TOC) CM/SW Contact:    ?Rayhaan Huster C Tarpley-Carter, LCSWA ?Phone Number: ?02/14/2022, 2:39 PM ? ? ?Clinical Narrative: ?Pt participated in [place].  Pt stated he does not use substance or ETOH.  Pt was not offered resources, due to no usage of substance or ETOH.    ? ?Passenger transport manager, MSW, LCSW-A ?Pronouns:  She/Her/Hers ?[facility] ?Clinical Social Worker ?Direct Number:  [phone] ?[email or web address] ? ?CAGE-AID Screening: ?Substance Abuse Screening unable to be completed due to: : Patient unable to participate ? ?Have You Ever Felt You Ought to Cut Down on Your Drinking or Drug Use?: No ?Have People Annoyed You By Critizing Your Drinking Or Drug Use?: No ?Have You Felt Bad Or Guilty About Your Drinking Or Drug Use?: No ?Have You Ever Had a Drink or Used Drugs First Thing In The Morning to Steady Your Nerves or to Get Rid of a Hangover?: No ?CAGE-AID Score: 0 ? ?Substance Abuse Education Offered: No ? ?  ? ? ? ? ? ? ?

## 2022-02-14 NOTE — TOC CAGE-AID Note (Signed)
Transition of Care (TOC) - CAGE-AID Screening ? ? ?Patient Details  ?Name: Francisco Collier ?MRN: [id number] ?Date of Birth: [birth year] ? ?Transition of Care (TOC) CM/SW Contact:    ?Ercel Normoyle C Tarpley-Carter, LCSWA ?Phone Number: ?02/14/2022, 10:57 AM ? ? ?Clinical Narrative: ?Pt is unable to participate in Cage Aid. ?CSW will assess at a better time. ? ?Passenger transport manager, MSW, LCSW-A ?Pronouns:  She/Her/Hers ?[facility] ?Clinical Social Worker ?Direct Number:  [phone] ?[email or web address] ? ?CAGE-AID Screening: ?Substance Abuse Screening unable to be completed due to: : Patient unable to participate ? ?  ?  ?  ?  ?  ? ?Substance Abuse Education Offered: No ? ?  ? ? ? ? ? ? ?

## 2022-02-14 NOTE — Progress Notes (Signed)
OT Cancellation Note ? ?Patient Details ?Name: HIEN PERREIRA ?MRN: [id number] ?DOB: [birth year] ? ? ?Cancelled Treatment:     Pt back from MRI (and per RN off bedrest); however pt is groggy and dizzy from Ativan given for MRI. PT recommended we wait until later time to see pt. ? ?Golden Circle, OTR/L ?Acute Rehab Services ?Pager [phone] ?Office [phone] ? ? ? ?Almon Register ?02/14/2022, 12:13 PM ?

## 2022-02-14 NOTE — Progress Notes (Addendum)
STROKE TEAM PROGRESS NOTE   INTERVAL HISTORY Patient is seen in his room with no family at the bedside.  Yesterday, he experienced heaviness of the right leg, right arm weakness and right facial numbness.  He called EMS and presented to the ED.  TNK was given and he was admitted to the ICU.  MRI today reveals no acute abnormality.  Vitals:   02/14/22 1030 02/14/22 1100 02/14/22 1130 02/14/22 1200  BP: (!) 97/59 115/76 96/66 114/67  Pulse: 86 81  89  Resp: 17  (!) 29 (!) 23  Temp:   97.9 F (36.6 C)   TempSrc:   Oral   SpO2: 98% 97%  94%  Weight:      Height:       CBC:  Recent Labs  Lab 02/13/22 1229 02/13/22 1235 02/14/22 0335  WBC 6.3  --  8.2  NEUTROABS 3.7  --   --   HGB 14.3 14.3 13.6  HCT 43.0 42.0 41.1  MCV 89.8  --  88.4  PLT 280  --  818   Basic Metabolic Panel:  Recent Labs  Lab 02/13/22 1229 02/13/22 1235 02/14/22 0335  NA 137 139 140  K 4.2 4.2 4.2  CL 103 101 104  CO2 27  --  27  GLUCOSE 114* 106* 92  BUN '13 15 13  '$ CREATININE 1.03 0.90 1.05  CALCIUM 9.0  --  9.3   Lipid Panel:  Recent Labs  Lab 02/14/22 0335  CHOL 97  TRIG 76  HDL 31*  CHOLHDL 3.1  VLDL 15  LDLCALC 51   HgbA1c:  Recent Labs  Lab 02/14/22 0335  HGBA1C 4.6*   Urine Drug Screen:  Recent Labs  Lab 02/13/22 2024  LABOPIA NONE DETECTED  COCAINSCRNUR NONE DETECTED  LABBENZ NONE DETECTED  AMPHETMU NONE DETECTED  THCU NONE DETECTED  LABBARB NONE DETECTED    Alcohol Level No results for input(s): ETH in the last 168 hours.  IMAGING past 24 hours MR BRAIN WO CONTRAST  Result Date: 02/14/2022 CLINICAL DATA:  Neuro deficit, acute, stroke suspected; right-sided weakness EXAM: MRI HEAD WITHOUT CONTRAST TECHNIQUE: Multiplanar, multiecho pulse sequences of the brain and surrounding structures were obtained without intravenous contrast. COMPARISON:  August 2022 FINDINGS: Brain: There is no acute infarction or intracranial hemorrhage. There is no intracranial mass, mass effect,  or edema. There is no hydrocephalus or extra-axial fluid collection. Prominence of the ventricles and sulci reflects similar parenchymal volume loss. Patchy areas of T2 hyperintensity in the supratentorial white matter are nonspecific but probably reflects similar chronic microvascular ischemic changes. Vascular: Major vessel flow voids at the skull base are preserved. Skull and upper cervical spine: Normal marrow signal is preserved. Sinuses/Orbits: Paranasal sinuses are aerated. Orbits are unremarkable. Other: Sella is unremarkable.  Mastoid air cells are clear. IMPRESSION: No evidence of recent infarction, hemorrhage, or mass. Similar chronic microvascular ischemic changes. Electronically Signed   By: Macy Mis M.D.   On: 02/14/2022 11:27   DG Chest Port 1 View  Result Date: 02/14/2022 CLINICAL DATA:  81 year old male code stroke presentation. EXAM: PORTABLE CHEST 1 VIEW COMPARISON:  Chest radiographs 08/08/2021. FINDINGS: Portable AP semi upright view at 0522 hours. Lower lung volumes. Stable mild cardiomegaly. Other mediastinal contours are within normal limits. Visualized tracheal air column is within normal limits. Allowing for some crowding of lung markings today (left lung base) and for portable technique the lungs are otherwise clear. No pneumothorax or pleural effusion. No acute osseous abnormality identified. IMPRESSION:  Low lung volumes with mild atelectasis. Electronically Signed   By: Genevie Ann M.D.   On: 02/14/2022 08:05   ECHOCARDIOGRAM COMPLETE  Result Date: 02/13/2022    ECHOCARDIOGRAM REPORT   Patient Name:   Francisco Collier Date of Exam: 02/13/2022 Medical Rec #:  [id number]       Height:       74.0 in Accession #:    [id number]      Weight:       207.2 lb Date of Birth:  1941-01-30       BSA:          2.207 m Patient Age:    54 years        BP:           117/63 mmHg Patient Gender: M               HR:           78 bpm. Exam Location:  Inpatient Procedure: 2D Echo, Cardiac Doppler, Color  Doppler and Intracardiac            Opacification Agent Indications:    Stroke  History:        Patient has prior history of Echocardiogram examinations, most                 recent 12/29/2020. CAD and Previous Myocardial Infarction, COPD;                 Risk Factors:Diabetes, Hypertension and Dyslipidemia. GERD. Hx                 CVA.  Sonographer:    Clayton Lefort RDCS (AE) Referring Phys: [id number] JESSICA L JONES  Sonographer Comments: Technically difficult study due to poor echo windows. Image acquisition challenging due to COPD. IMPRESSIONS  1. Left ventricular ejection fraction, by estimation, is 60 to 65%. The left ventricle has normal function. The left ventricle demonstrates regional wall motion abnormalities (see scoring diagram/findings for description). Left ventricular diastolic parameters are consistent with Grade I diastolic dysfunction (impaired relaxation).  2. Right ventricular systolic function is normal. The right ventricular size is normal.  3. A small pericardial effusion is present. The pericardial effusion is anterior to the right ventricle. There is no evidence of cardiac tamponade.  4. The mitral valve is normal in structure. No evidence of mitral valve regurgitation. No evidence of mitral stenosis.  5. The aortic valve is tricuspid. There is mild calcification of the aortic valve. Aortic valve regurgitation is not visualized. Aortic valve sclerosis is present, with no evidence of aortic valve stenosis.  6. Aortic dilatation noted. There is mild dilatation of the ascending aorta, measuring 40 mm.  7. The inferior vena cava is normal in size with greater than 50% respiratory variability, suggesting right atrial pressure of 3 mmHg. Conclusion(s)/Recommendation(s): No intracardiac source of embolism detected on this transthoracic study. Consider a transesophageal echocardiogram to exclude cardiac source of embolism if clinically indicated. FINDINGS  Left Ventricle: Left ventricular ejection  fraction, by estimation, is 60 to 65%. The left ventricle has normal function. The left ventricle demonstrates regional wall motion abnormalities. Definity contrast agent was given IV to delineate the left ventricular endocardial borders. The left ventricular internal cavity size was normal in size. There is no left ventricular hypertrophy. Left ventricular diastolic parameters are consistent with Grade I diastolic dysfunction (impaired relaxation).  LV Wall Scoring: The basal inferolateral segment is normal. Right Ventricle: The right ventricular size is normal. No  increase in right ventricular wall thickness. Right ventricular systolic function is normal. Left Atrium: Left atrial size was normal in size. Right Atrium: Right atrial size was normal in size. Pericardium: A small pericardial effusion is present. The pericardial effusion is anterior to the right ventricle. There is no evidence of cardiac tamponade. Presence of epicardial fat layer. Mitral Valve: The mitral valve is normal in structure. No evidence of mitral valve regurgitation. No evidence of mitral valve stenosis. Tricuspid Valve: The tricuspid valve is normal in structure. Tricuspid valve regurgitation is not demonstrated. No evidence of tricuspid stenosis. Aortic Valve: The aortic valve is tricuspid. There is mild calcification of the aortic valve. Aortic valve regurgitation is not visualized. Aortic valve sclerosis is present, with no evidence of aortic valve stenosis. Aortic valve mean gradient measures 2.0 mmHg. Aortic valve peak gradient measures 3.0 mmHg. Aortic valve area, by VTI measures 3.31 cm. Pulmonic Valve: The pulmonic valve was normal in structure. Pulmonic valve regurgitation is not visualized. No evidence of pulmonic stenosis. Aorta: Aortic dilatation noted. There is mild dilatation of the ascending aorta, measuring 40 mm. Venous: The inferior vena cava is normal in size with greater than 50% respiratory variability, suggesting  right atrial pressure of 3 mmHg. IAS/Shunts: No atrial level shunt detected by color flow Doppler.  LEFT VENTRICLE PLAX 2D LVIDd:         5.30 cm   Diastology LVIDs:         3.80 cm   LV e' medial:    6.40 cm/s LV PW:         1.10 cm   LV E/e' medial:  12.1 LV IVS:        1.00 cm   LV e' lateral:   4.99 cm/s LVOT diam:     2.00 cm   LV E/e' lateral: 15.5 LV SV:         56 LV SV Index:   25 LVOT Area:     3.14 cm  RIGHT VENTRICLE RV S prime:     14.20 cm/s TAPSE (M-mode): 1.9 cm LEFT ATRIUM           Index        RIGHT ATRIUM           Index LA diam:      2.50 cm 1.13 cm/m   RA Area:     10.70 cm LA Vol (A4C): 40.7 ml 18.45 ml/m  RA Volume:   20.50 ml  9.29 ml/m  AORTIC VALVE AV Area (Vmax):    3.29 cm AV Area (Vmean):   3.21 cm AV Area (VTI):     3.31 cm AV Vmax:           86.10 cm/s AV Vmean:          58.700 cm/s AV VTI:            0.169 m AV Peak Grad:      3.0 mmHg AV Mean Grad:      2.0 mmHg LVOT Vmax:         90.20 cm/s LVOT Vmean:        59.900 cm/s LVOT VTI:          0.178 m LVOT/AV VTI ratio: 1.05  AORTA Ao Root diam: 3.50 cm Ao Asc diam:  4.00 cm MITRAL VALVE MV Area (PHT): 2.43 cm    SHUNTS MV Decel Time: 312 msec    Systemic VTI:  0.18 m MV E velocity: 77.40 cm/s  Systemic  Diam: 2.00 cm MV A velocity: 87.40 cm/s MV E/A ratio:  0.89 Candee Furbish MD Electronically signed by Candee Furbish MD Signature Date/Time: 02/13/2022/4:53:23 PM    Final    CT HEAD CODE STROKE WO CONTRAST  Result Date: 02/13/2022 CLINICAL DATA:  Code stroke. Neuro deficit, acute, stroke suspected. EXAM: CT HEAD WITHOUT CONTRAST TECHNIQUE: Contiguous axial images were obtained from the base of the skull through the vertex without intravenous contrast. RADIATION DOSE REDUCTION: This exam was performed according to the departmental dose-optimization program which includes automated exposure control, adjustment of the mA and/or kV according to patient size and/or use of iterative reconstruction technique. COMPARISON:  Brain MRI  08/08/2021. FINDINGS: Brain: Generalized cerebral atrophy. Mild-to-moderate patchy and ill-defined hypoattenuation within the cerebral white matter, nonspecific but compatible with chronic small vessel ischemic disease. There is no acute intracranial hemorrhage. No demarcated cortical infarct. No extra-axial fluid collection. No evidence of an intracranial mass. No midline shift. Vascular: No hyperdense vessel.  Atherosclerotic calcifications. Skull: Normal. Negative for fracture or focal lesion. Sinuses/Orbits: Visualized orbits show no acute finding. No significant paranasal sinus disease at the imaged levels. 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 These results were communicated to Dr. Erlinda Hong At 12:50 pmon 3/5/2023by text page via the [facility] messaging system. IMPRESSION: No evidence of acute intracranial abnormality. Mild-to-moderate chronic small vessel ischemic changes within the cerebral white matter. Generalized cerebral atrophy. 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 Electronically Signed   By: Kellie Simmering D.O.   On: 02/13/2022 12:51   CT ANGIO HEAD NECK W WO CM (CODE STROKE)  Result Date: 02/13/2022 CLINICAL DATA:  Neuro deficit, acute, stroke suspected. EXAM: CT ANGIOGRAPHY HEAD AND NECK TECHNIQUE: Multidetector CT imaging of the head and neck was performed using the standard protocol during bolus administration of intravenous contrast. Multiplanar CT image reconstructions and MIPs were obtained to evaluate the vascular anatomy. Carotid stenosis measurements (when applicable) are obtained utilizing NASCET criteria, using  the distal internal carotid diameter as the denominator. RADIATION DOSE REDUCTION: This exam was performed according to the departmental dose-optimization program which includes automated exposure control, adjustment of the mA and/or kV according to patient size and/or use of iterative reconstruction technique. CONTRAST:  Administered contrast not known at this time. COMPARISON:  Noncontrast head CT performed earlier today 02/13/2022. CT angiogram head/neck 12/28/2020. Thyroid ultrasound 04/06/2010. FINDINGS: CTA NECK FINDINGS Aortic arch: Standard aortic branching. The visualized aortic arch is normal in caliber. Atherosclerotic plaque within the proximal A2 branch vessels of the neck. No hemodynamically significant innominate or proximal subclavian artery stenosis. Right carotid system: CCA and ICA patent within the neck without stenosis. Mild atherosclerotic plaque about the carotid bifurcation. Left carotid system: CCA and ICA patent within the neck without stenosis. Mild atherosclerotic plaque about the carotid bifurcation. Vertebral arteries: Vertebral arteries codominant and patent within the neck. As before, there is moderate narrowing of the V2 left vertebral artery at the C3-C4 level due to mass effect from adjacent degenerative bony spurring. Skeleton: Cervical spondylosis and multilevel facet joint ankylosis. No acute bony abnormality or aggressive osseous lesion. 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 Prior right hemithyroidectomy. 1.6 cm nodule within the left thyroid lobe, new and/or increased in size as compared to the prior thyroid ultrasound of 04/06/2010. Upper chest: No consolidation within the imaged lung apices. Centrilobular and paraseptal emphysema. Review of the MIP images confirms the above findings CTA HEAD FINDINGS Anterior circulation: The intracranial internal carotid arteries are patent.  Atherosclerotic plaque within both vessels. No more than mild stenosis of the intracranial right ICA. As before, there is up to moderate stenosis within the distal cavernous and paraclinoid left ICA. The M1 middle cerebral arteries are patent. Atherosclerotic irregularity of the M2 and more distal MCA vessels, bilaterally. No M2 proximal branch occlusion or high-grade proximal stenosis is identified. The anterior cerebral arteries are patent. No intracranial aneurysm is identified. Posterior circulation: The intracranial vertebral arteries are patent. Atherosclerotic plaque within the V4 vertebral arteries bilaterally with no more than mild stenosis. The basilar artery is patent. The posterior cerebral arteries are patent. Redemonstrated sites of mild stenosis within the P2 posterior cerebral arteries, bilaterally. Posterior communicating arteries are diminutive or absent bilaterally. Venous sinuses: Within the limitations of contrast timing, no convincing thrombus. Anatomic variants: As described. Review of the MIP images confirms the above findings No emergent large vessel occlusion identified. These results were called by telephone at the time of interpretation on 02/13/2022 at 1:07 pm to provider Dr. Erlinda Hong, who verbally acknowledged these results. IMPRESSION: CTA neck: 1. Common carotid and internal carotid arteries are patent within the neck without hemodynamically significant stenosis. Mild atherosclerotic plaque about both carotid bifurcations. 2. Vertebral arteries patent within the neck. Unchanged moderate narrowing of the V2 left vertebral artery at the C3-C4 level due to mass effect from adjacent degenerative bony spurring. 3. Redemonstrated sizable mass within the right parotid gland and right parapharyngeal space, as described on multiple prior examinations. Primary differential considerations include a low-flow vascular malformation versus neoplasm. 4. 1.6 cm nodule within the left thyroid lobe, new and/or  increased in size as compared to the prior thyroid ultrasound of 04/06/2010. A Non-emergent thyroid ultrasound recommended for further evaluation. CTA head: 1. No intracranial large vessel occlusion is identified. 2. Intracranial atherosclerotic disease, as described. Most notably, there is up to moderate stenosis within the distal cavernous/paraclinoid left ICA. Electronically Signed   By: Kellie Simmering D.O.   On: 02/13/2022 13:08    PHYSICAL EXAM General:  Alert, well-developed, well-nourished patient in no acute distress Respiratory: Regular, unlabored respirations on room air CV: Regular rate and rhythm on monitor  NEURO:  Mental Status: AA&Ox3  Speech/Language: speech is without dysarthria or aphasia.  Naming, repetition, fluency, and comprehension intact.  Cranial Nerves:  II: PERRL. Visual fields full.  III, IV, VI: EOMI. Eyelids elevate symmetrically.  V: Sensation is intact to light touch and symmetrical to face.  VII: Smile is symmetrical.  VIII: hearing intact to voice. IX, X: Phonation is normal.  XII: tongue is midline without fasciculations. Motor: 5/5 strength to all muscle groups tested.  Tone: is normal and bulk is normal Sensation- Intact to light touch bilaterally. Extinction absent to light touch to DSS.   Coordination: FTN intact bilaterally Gait- deferred   ASSESSMENT/PLAN Francisco Collier is a 81 y.o. male with history of TIA, CAD, COPD, GERD, MI, HTN, HLD, gastric ulcer and esophageal stricture presenting with heaviness of the right leg, right arm weakness and right facial numbness.  He called EMS and presented to the ED.  TNK was given and he was admitted to  the ICU.  MRI today reveals no acute abnormality.  Stroke symptoms s/p TNK TIA vs. Anxiety vs. Conversion disorder Code Stroke CT head No acute abnormality. Small vessel disease. Atrophy.  CTA head & neck moderate narrowing of left V2, moderate stenosis in distal paraclinoid left ICA MRI  no recent  infarction or hemorrhage, chronic microvascular ischemic changes 2D Echo EF 59-29%, grade 1 diastolic dysfunction, small pericardial effusion, no atrial level shunt LDL 51 HgbA1c 4.6 VTE prophylaxis - SCDs No antithrombotic prior to admission, now on home plavix. Continue on discharge. Therapy recommendations:  HH PT Disposition:  pending  History of TIA He was admitted in 12/2020 for headache and right-sided weakness status post tPA and symptom improved significantly after tPA and close to baseline.  CT/MRI no acute finding.  CT head and neck no LVO.  EF 55 to 60%.  LDL 55, A1c 4.7.  Discharged on DAPT for 3 weeks, Lipitor and fenofibrate. In 01/2021 he was again admitted for right-sided weakness and confusion, MRI whole spine showed spinal stenosis but no spinal cord compression.  Hypertension Home meds:  amlodipine 10 mg daily Stable Long-term BP goal normotensive  Hyperlipidemia Home meds:  atorvastatin 10 mg daily, fenofibrate 160 mg daily, resumed in hospital LDL 51, goal < 70 Continue statin at discharge  Other Stroke Risk Factors Advanced Age >/= 37  Former Cigarette smoker  Other Active Problems Thyroid nodule 1.6 cm thyroid nodule seen on CT Recommend outpatient thyroid ultrasound Parotid mass CTA neck - Redemonstrated sizable mass within the right parotid gland and right parapharyngeal space, as described on multiple prior examinations. Primary differential considerations include a low-flow vascular malformation versus neoplasm.  Outpatient follow-up  Hospital day # [place] , MSN, AGACNP-BC [facility] See Amion for schedule and pager information 02/14/2022 12:45 PM   ATTENDING NOTE: I reviewed above note and agree with the assessment and plan. Pt was seen and examined.   No family at bedside.  Patient sitting in chair, stated that he is back to baseline.  No significant right-sided weakness or numbness.  Neuro exam at baseline.  MRI no  acute infarct.  Patient had similar symptoms 1 year ago status post tPA and MRI negative.  Symptoms also quickly resolved.  Etiology not quite clear, possible TIA versus anxiety versus conversion disorder.  PT/OT recommend home health.  Put back on home Plavix and statin.  Continue on discharge.  For detailed assessment and plan, please refer to above as I have made changes wherever appropriate.   Rosalin Hawking, MD PhD Stroke Neurology 02/14/2022 5:20 PM  This patient is critically ill due to stroke symptoms status post TNK and at significant risk of neurological worsening, death form stroke, hemorrhage from TNK. This patient's care requires constant monitoring of vital signs, hemodynamics, respiratory and cardiac monitoring, review of multiple databases, neurological assessment, discussion with family, other specialists and medical decision making of high complexity. I spent 30 minutes of neurocritical care time in the care of this patient.    To contact Stroke Continuity provider, please refer to [email or web address] After hours, contact General Neurology

## 2022-02-15 DIAGNOSIS — I639 Cerebral infarction, unspecified: Secondary | ICD-10-CM

## 2022-02-15 DIAGNOSIS — I10 Essential (primary) hypertension: Secondary | ICD-10-CM

## 2022-02-15 DIAGNOSIS — G459 Transient cerebral ischemic attack, unspecified: Secondary | ICD-10-CM | POA: Diagnosis not present

## 2022-02-15 DIAGNOSIS — E7800 Pure hypercholesterolemia, unspecified: Secondary | ICD-10-CM | POA: Diagnosis not present

## 2022-02-15 LAB — BASIC METABOLIC PANEL
Anion gap: 9 (ref 5–15)
BUN: 15 mg/dL (ref 8–23)
CO2: 25 mmol/L (ref 22–32)
Calcium: 9.4 mg/dL (ref 8.9–10.3)
Chloride: 102 mmol/L (ref 98–111)
Creatinine, Ser: 0.95 mg/dL (ref 0.61–1.24)
GFR, Estimated: >60 mL/min (ref >60)
Glucose, Bld: 106 mg/dL — ABNORMAL HIGH (ref 70–99)
Potassium: 4 mmol/L (ref 3.5–5.1)
Sodium: 136 mmol/L (ref 135–145)

## 2022-02-15 LAB — CBC
HCT: 41.4 % (ref 39.0–52.0)
Hemoglobin: 13.8 g/dL (ref 13.0–17.0)
MCH: 29.4 pg (ref 26.0–34.0)
MCHC: 33.3 g/dL (ref 30.0–36.0)
MCV: 88.1 fL (ref 80.0–100.0)
Platelets: 301 10*3/uL (ref 150–400)
RBC: 4.7 MIL/uL (ref 4.22–5.81)
RDW: 13.5 % (ref 11.5–15.5)
WBC: 7.6 10*3/uL (ref 4.0–10.5)
nRBC: 0 % (ref 0.0–0.2)

## 2022-02-15 MED ORDER — CLOPIDOGREL BISULFATE 75 MG PO TABS: 75.0000 mg | ORAL_TABLET | Freq: Every day | ORAL | 1 refills | Status: DC

## 2022-02-15 NOTE — TOC Transition Note (Addendum)
Transition of Care (TOC) - CM/SW Discharge Note ? ? ?Patient Details  ?Name: Francisco Collier ?MRN: [id number] ?Date of Birth: Mar 09, 1941 ? ?Transition of Care [facility]) CM/SW Contact:  ?Ella Bodo, RN ?Phone Number: ?02/15/2022, 2:36 PM ? ? ?Clinical Narrative:    ?Pt is a 82 yo male who presents with RUE and LE weakness, given TNK.  MRI neg for acute findings. PMHx of CVA with TPA, CAD, MI s/p PTCA of RCA in 2005 with chronic dual antiplatelet therapy, DM II, GERD, HLD, HTN, and COPD.  PTA, pt independent and living at home alone; he states his brother and neighbors can provide intermittent assistance.  PT/OT recommending HH follow up, and pt agreeable to services.  He has no preference for [facility] provider.  Referral to [facility] for continued home therapies; no DME needs, as pt has RW and shower seat at home.    ? ? ?Final next level of care: [place] ? ?Patient Goals and CMS Choice ?Patient states their goals for this hospitalization and ongoing recovery are:: to go home ?CMS Medicare.gov Compare Post Acute Care list provided to:: Patient ?Choice offered to / list presented to : Patient ? ?  ?           ?  ?  ?  ?  ? ?Discharge Plan and Services ?  ?Discharge Planning Services: CM Consult ?Post Acute Care Choice: Home Health          ?  ?  ?  ?  ?  ?  ?  ?  ?  ?  ? ?Social Determinants of Health (SDOH) Interventions ?  ? ? ?Readmission Risk Interventions ?No flowsheet data found. ? ?Reinaldo Raddle, RN, BSN  ?Trauma/Neuro ICU Case Manager ?[phone] ? ? ? ? ?

## 2022-02-15 NOTE — Plan of Care (Signed)
Care plan reviewed.

## 2022-02-15 NOTE — Discharge Summary (Addendum)
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   atorvastatin 10 MG tablet Commonly known as: LIPITOR Take 10 mg by mouth daily.   clopidogrel 75 MG tablet Commonly known as: PLAVIX Take 75 mg by mouth daily. What changed: Another medication with the same name was added. Make sure you understand how and when to take each.   clopidogrel 75 MG tablet Commonly known as: PLAVIX Take 1 tablet (75 mg total) by mouth daily. Start taking on: February 16, 2022 What changed: You were already taking a medication with the same name, and this prescription was added. Make sure you understand how and when to take each.   diclofenac Sodium 1 % Gel Commonly known as: VOLTAREN Apply 2 g topically 4 (four) times daily.   fenofibrate 160 MG tablet Take 160 mg by mouth daily.   fluticasone 50 MCG/ACT nasal spray Commonly known as: FLONASE Place 1 spray into both nostrils 2 (two) times daily as needed for allergies or rhinitis.   lisinopril 10 MG tablet Commonly known as: Prinivil Take 1 tablet (10 mg total) by mouth daily.   meclizine 25 MG tablet Commonly known as: ANTIVERT Take 1 tablet (25 mg total) by mouth 3 (three) times daily as needed for dizziness.   metoprolol tartrate 25  MG tablet Commonly known as: LOPRESSOR Take 0.5 tablets (12.5 mg total) by mouth 2 (two) times daily.   oxyCODONE-acetaminophen 10-325 MG tablet Commonly known as: PERCOCET Take 1 tablet by mouth 3 (three) times daily as needed for pain.   pantoprazole 20 MG tablet Commonly known as: PROTONIX Take 1 tablet (20 mg total) by mouth daily. What changed:  when to take this reasons to take this        LABORATORY STUDIES CBC    Component Value Date/Time   WBC 7.6 02/15/2022 0322   RBC 4.70 02/15/2022 0322   HGB 13.8 02/15/2022 0322   HCT 41.4 02/15/2022 0322   PLT 301 02/15/2022 0322   MCV 88.1 02/15/2022 0322   MCH 29.4 02/15/2022 0322   MCHC 33.3 02/15/2022 0322   RDW 13.5 02/15/2022 0322   LYMPHSABS 1.9 02/13/2022 1229   MONOABS 0.5 02/13/2022 1229   EOSABS 0.1 02/13/2022 1229   BASOSABS 0.0 02/13/2022 1229   CMP    Component Value Date/Time   NA 136 02/15/2022 0322   K 4.0 02/15/2022 0322   CL 102 02/15/2022 0322   CO2 25 02/15/2022 0322   GLUCOSE 106 (H) 02/15/2022 0322   BUN 15 02/15/2022 0322   CREATININE 0.95 02/15/2022 0322  CALCIUM 9.4 02/15/2022 0322   PROT 6.3 (L) 02/13/2022 1229   ALBUMIN 3.7 02/13/2022 1229   AST 27 02/13/2022 1229   ALT 32 02/13/2022 1229   ALKPHOS 48 02/13/2022 1229   BILITOT 0.4 02/13/2022 1229   GFRNONAA >60 02/15/2022 0322   GFRAA >60 11/27/2016 2351   COAGS Lab Results  Component Value Date   INR 1.0 02/13/2022   INR 1.0 12/28/2020   INR 1.08 11/12/2015   Lipid Panel    Component Value Date/Time   CHOL 97 02/14/2022 0335   TRIG 76 02/14/2022 0335   HDL 31 (L) 02/14/2022 0335   CHOLHDL 3.1 02/14/2022 0335   VLDL 15 02/14/2022 0335   LDLCALC 51 02/14/2022 0335   HgbA1C  Lab Results  Component Value Date   HGBA1C 4.6 (L) 02/14/2022   Urinalysis    Component Value Date/Time   COLORURINE YELLOW 02/13/2022 1313   APPEARANCEUR CLEAR 02/13/2022 1313   LABSPEC 1.025 02/13/2022 1313   PHURINE 6.0 02/13/2022 1313    GLUCOSEU NEGATIVE 02/13/2022 1313   HGBUR NEGATIVE 02/13/2022 1313   BILIRUBINUR NEGATIVE 02/13/2022 1313   KETONESUR NEGATIVE 02/13/2022 1313   PROTEINUR NEGATIVE 02/13/2022 1313   UROBILINOGEN 1.0 09/12/2015 1658   NITRITE NEGATIVE 02/13/2022 1313   LEUKOCYTESUR NEGATIVE 02/13/2022 1313   Urine Drug Screen     Component Value Date/Time   LABOPIA NONE DETECTED 02/13/2022 2024   COCAINSCRNUR NONE DETECTED 02/13/2022 2024   LABBENZ NONE DETECTED 02/13/2022 2024   AMPHETMU NONE DETECTED 02/13/2022 2024   THCU NONE DETECTED 02/13/2022 2024   LABBARB NONE DETECTED 02/13/2022 2024    Alcohol Level    Component Value Date/Time   [facility]  12/29/2008 0510    <5        LOWEST DETECTABLE LIMIT FOR SERUM ALCOHOL IS 5 mg/dL FOR MEDICAL PURPOSES ONLY     SIGNIFICANT DIAGNOSTIC STUDIES MR BRAIN WO CONTRAST  Result Date: 02/14/2022 CLINICAL DATA:  Neuro deficit, acute, stroke suspected; right-sided weakness EXAM: MRI HEAD WITHOUT CONTRAST TECHNIQUE: Multiplanar, multiecho pulse sequences of the brain and surrounding structures were obtained without intravenous contrast. COMPARISON:  August 2022 FINDINGS: Brain: There is no acute infarction or intracranial hemorrhage. There is no intracranial mass, mass effect, or edema. There is no hydrocephalus or extra-axial fluid collection. Prominence of the ventricles and sulci reflects similar parenchymal volume loss. Patchy areas of T2 hyperintensity in the supratentorial white matter are nonspecific but probably reflects similar chronic microvascular ischemic changes. Vascular: Major vessel flow voids at the skull base are preserved. Skull and upper cervical spine: Normal marrow signal is preserved. Sinuses/Orbits: Paranasal sinuses are aerated. Orbits are unremarkable. Other: Sella is unremarkable.  Mastoid air cells are clear. IMPRESSION: No evidence of recent infarction, hemorrhage, or mass. Similar chronic microvascular ischemic changes. Electronically  Signed   By: Macy Mis M.D.   On: 02/14/2022 11:27   CT Abdomen Pelvis W Contrast  Result Date: 01/17/2022 CLINICAL DATA:  Abdominal pain. Abdominal pain. LEFT lower quadrant pain radiating to testicle EXAM: CT ABDOMEN AND PELVIS WITH CONTRAST TECHNIQUE: Multidetector CT imaging of the abdomen and pelvis was performed using the standard protocol following bolus administration of intravenous contrast. RADIATION DOSE REDUCTION: This exam was performed according to the departmental dose-optimization program which includes automated exposure control, adjustment of the mA and/or kV according to patient size and/or use of iterative reconstruction technique. CONTRAST:  152m OMNIPAQUE IOHEXOL 300 MG/ML  SOLN COMPARISON:  None. FINDINGS: Lower chest: Lung bases are clear. Hepatobiliary:  No focal hepatic lesion. Postcholecystectomy. No biliary dilatation. Pancreas: Pancreas is normal. No ductal dilatation. No pancreatic inflammation. Spleen: Normal spleen Adrenals/urinary tract: Adrenal glands and kidneys are normal. The ureters and bladder normal. Stomach/Bowel: Stomach, small bowel, appendix, and cecum are normal. The colon and rectosigmoid colon are normal. Vascular/Lymphatic: Abdominal aorta is normal caliber with atherosclerotic calcification. There is no retroperitoneal or periportal lymphadenopathy. No pelvic lymphadenopathy. Reproductive: Bulge along the LEFT margin of the prostate gland (image 88/2). Other: Fat filled LEFT inguinal hernia. Musculoskeletal: No aggressive osseous lesion. IMPRESSION: 1. No clear explanation for LEFT lower quadrant pain. No diverticulosis. NO obstructive uropathy. 2. Fat filled LEFT inguinal hernia without complicating features. 3. Asymmetric bulge along the lateral aspect of the LEFT lobe of the prostate gland. Recommend correlation with PSA level. Electronically Signed   By: Suzy Bouchard M.D.   On: 01/17/2022 13:42   DG Chest Port 1 View  Result Date:  02/14/2022 CLINICAL DATA:  81 year old male code stroke presentation. EXAM: PORTABLE CHEST 1 VIEW COMPARISON:  Chest radiographs 08/08/2021. FINDINGS: Portable AP semi upright view at 0522 hours. Lower lung volumes. Stable mild cardiomegaly. Other mediastinal contours are within normal limits. Visualized tracheal air column is within normal limits. Allowing for some crowding of lung markings today (left lung base) and for portable technique the lungs are otherwise clear. No pneumothorax or pleural effusion. No acute osseous abnormality identified. IMPRESSION: Low lung volumes with mild atelectasis. Electronically Signed   By: Genevie Ann M.D.   On: 02/14/2022 08:05   ECHOCARDIOGRAM COMPLETE  Result Date: 02/13/2022    ECHOCARDIOGRAM REPORT   Patient Name:   ZYION DOXTATER Montel Date of Exam: 02/13/2022 Medical Rec #:  [id number]       Height:       74.0 in Accession #:    [id number]      Weight:       207.2 lb Date of Birth:  02/15/1941       BSA:          2.207 m Patient Age:    80 years        BP:           117/63 mmHg Patient Gender: M               HR:           78 bpm. Exam Location:  Inpatient Procedure: 2D Echo, Cardiac Doppler, Color Doppler and Intracardiac            Opacification Agent Indications:    Stroke  History:        Patient has prior history of Echocardiogram examinations, most                 recent 12/29/2020. CAD and Previous Myocardial Infarction, COPD;                 Risk Factors:Diabetes, Hypertension and Dyslipidemia. GERD. Hx                 CVA.  Sonographer:    Clayton Lefort RDCS (AE) Referring Phys: [id number] JESSICA L JONES  Sonographer Comments: Technically difficult study due to poor echo windows. Image acquisition challenging due to COPD. IMPRESSIONS  1. Left ventricular ejection fraction, by estimation, is 60 to 65%. The left ventricle has normal function. The left ventricle demonstrates regional wall motion abnormalities (see scoring diagram/findings for description). Left ventricular diastolic  parameters are consistent with Grade I diastolic dysfunction (impaired relaxation).  2. Right  ventricular systolic function is normal. The right ventricular size is normal.  3. A small pericardial effusion is present. The pericardial effusion is anterior to the right ventricle. There is no evidence of cardiac tamponade.  4. The mitral valve is normal in structure. No evidence of mitral valve regurgitation. No evidence of mitral stenosis.  5. The aortic valve is tricuspid. There is mild calcification of the aortic valve. Aortic valve regurgitation is not visualized. Aortic valve sclerosis is present, with no evidence of aortic valve stenosis.  6. Aortic dilatation noted. There is mild dilatation of the ascending aorta, measuring 40 mm.  7. The inferior vena cava is normal in size with greater than 50% respiratory variability, suggesting right atrial pressure of 3 mmHg. Conclusion(s)/Recommendation(s): No intracardiac source of embolism detected on this transthoracic study. Consider a transesophageal echocardiogram to exclude cardiac source of embolism if clinically indicated. FINDINGS  Left Ventricle: Left ventricular ejection fraction, by estimation, is 60 to 65%. The left ventricle has normal function. The left ventricle demonstrates regional wall motion abnormalities. Definity contrast agent was given IV to delineate the left ventricular endocardial borders. The left ventricular internal cavity size was normal in size. There is no left ventricular hypertrophy. Left ventricular diastolic parameters are consistent with Grade I diastolic dysfunction (impaired relaxation).  LV Wall Scoring: The basal inferolateral segment is normal. Right Ventricle: The right ventricular size is normal. No increase in right ventricular wall thickness. Right ventricular systolic function is normal. Left Atrium: Left atrial size was normal in size. Right Atrium: Right atrial size was normal in size. Pericardium: A small pericardial  effusion is present. The pericardial effusion is anterior to the right ventricle. There is no evidence of cardiac tamponade. Presence of epicardial fat layer. Mitral Valve: The mitral valve is normal in structure. No evidence of mitral valve regurgitation. No evidence of mitral valve stenosis. Tricuspid Valve: The tricuspid valve is normal in structure. Tricuspid valve regurgitation is not demonstrated. No evidence of tricuspid stenosis. Aortic Valve: The aortic valve is tricuspid. There is mild calcification of the aortic valve. Aortic valve regurgitation is not visualized. Aortic valve sclerosis is present, with no evidence of aortic valve stenosis. Aortic valve mean gradient measures 2.0 mmHg. Aortic valve peak gradient measures 3.0 mmHg. Aortic valve area, by VTI measures 3.31 cm. Pulmonic Valve: The pulmonic valve was normal in structure. Pulmonic valve regurgitation is not visualized. No evidence of pulmonic stenosis. Aorta: Aortic dilatation noted. There is mild dilatation of the ascending aorta, measuring 40 mm. Venous: The inferior vena cava is normal in size with greater than 50% respiratory variability, suggesting right atrial pressure of 3 mmHg. IAS/Shunts: No atrial level shunt detected by color flow Doppler.  LEFT VENTRICLE PLAX 2D LVIDd:         5.30 cm   Diastology LVIDs:         3.80 cm   LV e' medial:    6.40 cm/s LV PW:         1.10 cm   LV E/e' medial:  12.1 LV IVS:        1.00 cm   LV e' lateral:   4.99 cm/s LVOT diam:     2.00 cm   LV E/e' lateral: 15.5 LV SV:         56 LV SV Index:   25 LVOT Area:     3.14 cm  RIGHT VENTRICLE RV S prime:     14.20 cm/s TAPSE (M-mode): 1.9 cm LEFT ATRIUM  Index        RIGHT ATRIUM           Index LA diam:      2.50 cm 1.13 cm/m   RA Area:     10.70 cm LA Vol (A4C): 40.7 ml 18.45 ml/m  RA Volume:   20.50 ml  9.29 ml/m  AORTIC VALVE AV Area (Vmax):    3.29 cm AV Area (Vmean):   3.21 cm AV Area (VTI):     3.31 cm AV Vmax:           86.10 cm/s AV  Vmean:          58.700 cm/s AV VTI:            0.169 m AV Peak Grad:      3.0 mmHg AV Mean Grad:      2.0 mmHg LVOT Vmax:         90.20 cm/s LVOT Vmean:        59.900 cm/s LVOT VTI:          0.178 m LVOT/AV VTI ratio: 1.05  AORTA Ao Root diam: 3.50 cm Ao Asc diam:  4.00 cm MITRAL VALVE MV Area (PHT): 2.43 cm    SHUNTS MV Decel Time: 312 msec    Systemic VTI:  0.18 m MV E velocity: 77.40 cm/s  Systemic Diam: 2.00 cm MV A velocity: 87.40 cm/s MV E/A ratio:  0.89 Candee Furbish MD Electronically signed by Candee Furbish MD Signature Date/Time: 02/13/2022/4:53:23 PM    Final    CT HEAD CODE STROKE WO CONTRAST  Result Date: 02/13/2022 CLINICAL DATA:  Code stroke. Neuro deficit, acute, stroke suspected. EXAM: CT HEAD WITHOUT CONTRAST TECHNIQUE: Contiguous axial images were obtained from the base of the skull through the vertex without intravenous contrast. RADIATION DOSE REDUCTION: This exam was performed according to the departmental dose-optimization program which includes automated exposure control, adjustment of the mA and/or kV according to patient size and/or use of iterative reconstruction technique. COMPARISON:  Brain MRI 08/08/2021. FINDINGS: Brain: Generalized cerebral atrophy. Mild-to-moderate patchy and ill-defined hypoattenuation within the cerebral white matter, nonspecific but compatible with chronic small vessel ischemic disease. There is no acute intracranial hemorrhage. No demarcated cortical infarct. No extra-axial fluid collection. No evidence of an intracranial mass. No midline shift. Vascular: No hyperdense vessel.  Atherosclerotic calcifications. Skull: Normal. Negative for fracture or focal lesion. Sinuses/Orbits: Visualized orbits show no acute finding. No significant paranasal sinus disease at the imaged levels. ASPECTS ([place] Stroke Program Early CT Score) - Ganglionic level infarction (caudate, lentiform nuclei, internal capsule, insula, M1-M3 cortex): 7 - Supraganglionic infarction (M4-M6  cortex): 3 Total score (0-10 with 10 being normal): 10 Other: Known soft tissue mass within the medial right parotid gland and right parapharyngeal space. These results were communicated to Dr. Erlinda Hong At 12:50 pmon 3/5/2023by text page via the [facility] messaging system. IMPRESSION: No evidence of acute intracranial abnormality. Mild-to-moderate chronic small vessel ischemic changes within the cerebral white matter. Generalized cerebral atrophy. 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 Electronically Signed   By: Kellie Simmering D.O.   On: 02/13/2022 12:51   CT ANGIO HEAD NECK W WO CM (CODE STROKE)  Result Date: 02/13/2022 CLINICAL DATA:  Neuro deficit, acute, stroke suspected. EXAM: CT ANGIOGRAPHY HEAD AND NECK TECHNIQUE: Multidetector CT imaging of the head and neck was performed using the standard protocol during bolus administration of intravenous contrast. Multiplanar CT  image reconstructions and MIPs were obtained to evaluate the vascular anatomy. Carotid stenosis measurements (when applicable) are obtained utilizing NASCET criteria, using the distal internal carotid diameter as the denominator. RADIATION DOSE REDUCTION: This exam was performed according to the departmental dose-optimization program which includes automated exposure control, adjustment of the mA and/or kV according to patient size and/or use of iterative reconstruction technique. CONTRAST:  Administered contrast not known at this time. COMPARISON:  Noncontrast head CT performed earlier today 02/13/2022. CT angiogram head/neck 12/28/2020. Thyroid ultrasound 04/06/2010. FINDINGS: CTA NECK FINDINGS Aortic arch: Standard aortic branching. The visualized aortic arch is normal in caliber. Atherosclerotic plaque within the proximal A2 branch vessels of the neck. No hemodynamically significant innominate or proximal subclavian artery stenosis. Right carotid system: CCA and  ICA patent within the neck without stenosis. Mild atherosclerotic plaque about the carotid bifurcation. Left carotid system: CCA and ICA patent within the neck without stenosis. Mild atherosclerotic plaque about the carotid bifurcation. Vertebral arteries: Vertebral arteries codominant and patent within the neck. As before, there is moderate narrowing of the V2 left vertebral artery at the C3-C4 level due to mass effect from adjacent degenerative bony spurring. Skeleton: Cervical spondylosis and multilevel facet joint ankylosis. No acute bony abnormality or aggressive osseous lesion. 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 Prior right hemithyroidectomy. 1.6 cm nodule within the left thyroid lobe, new and/or increased in size as compared to the prior thyroid ultrasound of 04/06/2010. Upper chest: No consolidation within the imaged lung apices. Centrilobular and paraseptal emphysema. Review of the MIP images confirms the above findings CTA HEAD FINDINGS Anterior circulation: The intracranial internal carotid arteries are patent. Atherosclerotic plaque within both vessels. No more than mild stenosis of the intracranial right ICA. As before, there is up to moderate stenosis within the distal cavernous and paraclinoid left ICA. The M1 middle cerebral arteries are patent. Atherosclerotic irregularity of the M2 and more distal MCA vessels, bilaterally. No M2 proximal branch occlusion or high-grade proximal stenosis is identified. The anterior cerebral arteries are patent. No intracranial aneurysm is identified. Posterior circulation: The intracranial vertebral arteries are patent. Atherosclerotic plaque within the V4 vertebral arteries bilaterally with no more than mild stenosis. The basilar artery is patent. The posterior cerebral arteries are patent. Redemonstrated sites of mild stenosis within the P2 posterior cerebral  arteries, bilaterally. Posterior communicating arteries are diminutive or absent bilaterally. Venous sinuses: Within the limitations of contrast timing, no convincing thrombus. Anatomic variants: As described. Review of the MIP images confirms the above findings No emergent large vessel occlusion identified. These results were called by telephone at the time of interpretation on 02/13/2022 at 1:07 pm to provider Dr. Erlinda Hong, who verbally acknowledged these results. IMPRESSION: CTA neck: 1. Common carotid and internal carotid arteries are patent within the neck without hemodynamically significant stenosis. Mild atherosclerotic plaque about both carotid bifurcations. 2. Vertebral arteries patent within the neck. Unchanged moderate narrowing of the V2 left vertebral artery at the C3-C4 level due to mass effect from adjacent degenerative bony spurring. 3. Redemonstrated sizable mass within the right parotid gland and right parapharyngeal space, as described on multiple prior examinations. Primary differential considerations include a low-flow vascular malformation versus neoplasm. 4. 1.6 cm nodule within the left thyroid lobe, new and/or increased in size as compared to the prior thyroid ultrasound of 04/06/2010. A Non-emergent thyroid ultrasound recommended for further evaluation. CTA head: 1. No intracranial large vessel occlusion is identified.  2. Intracranial atherosclerotic disease, as described. Most notably, there is up to moderate stenosis within the distal cavernous/paraclinoid left ICA. Electronically Signed   By: Kellie Simmering D.O.   On: 02/13/2022 13:08      HISTORY OF PRESENT ILLNESS Mr. AAMARI STRAWDERMAN is a 81 y.o. male with history of TIA, CAD, COPD, GERD, MI, HTN, HLD, gastric ulcer and esophageal stricture presenting with heaviness of the right leg, right arm weakness and right facial numbness.  He called EMS and presented to the ED.  TNK was given and he was admitted to the ICU.  MRI today reveals no acute  abnormality.   HOSPITAL COURSE Stroke symptoms s/p TNK Possible TIA, but DDx including Anxiety, Conversion disorder Code Stroke CT head No acute abnormality. Small vessel disease. Atrophy.  CTA head & neck moderate narrowing of left V2, moderate stenosis in distal paraclinoid left ICA MRI  no recent infarction or hemorrhage, chronic microvascular ischemic changes 2D Echo EF 53-29%, grade 1 diastolic dysfunction, small pericardial effusion, no atrial level shunt LDL 51 HgbA1c 4.6 VTE prophylaxis - SCDs No antithrombotic prior to admission, now on home plavix. Continue on discharge. Therapy recommendations:  HH PT Disposition:  pending   History of TIA He was admitted in 12/2020 for headache and right-sided weakness status post tPA and symptom improved significantly after tPA and close to baseline.  CT/MRI no acute finding.  CT head and neck no LVO.  EF 55 to 60%.  LDL 55, A1c 4.7.  Discharged on DAPT for 3 weeks, Lipitor and fenofibrate. In 01/2021 he was again admitted for right-sided weakness and confusion, MRI whole spine showed spinal stenosis but no spinal cord compression.   Hypertension Home meds:  amlodipine 10 mg daily Stable Long-term BP goal normotensive   Hyperlipidemia Home meds:  atorvastatin 10 mg daily, fenofibrate 160 mg daily, resumed in hospital LDL 51, goal < 70 Continue statin at discharge   Other Stroke Risk Factors Advanced Age >/= 18  Former Cigarette smoker   Other Active Problems Thyroid nodule 1.6 cm thyroid nodule seen on CT Recommend outpatient thyroid ultrasound Parotid mass CTA neck - Redemonstrated sizable mass within the right parotid gland and right parapharyngeal space, as described on multiple prior examinations. Primary differential considerations include a low-flow vascular malformation versus neoplasm.  Outpatient follow-up   DISCHARGE EXAM Blood pressure 126/73, pulse 100, temperature 97.6 F (36.4 C), temperature source Oral, resp.  rate 18, height '6\' 2"'$  (1.88 m), weight 94 kg, SpO2 95 %. PHYSICAL EXAM General:  Alert, well-developed, well-nourished patient in no acute distress Respiratory: Regular, unlabored respirations on room air CV: Regular rate and rhythm on monitor   NEURO:  Mental Status: AA&Ox3  Speech/Language: speech is without dysarthria or aphasia.  Naming, repetition, fluency, and comprehension intact.   Cranial Nerves:  II: PERRL. Visual fields full.  III, IV, VI: EOMI. Eyelids elevate symmetrically.  V: Sensation is intact to light touch and symmetrical to face.  VII: Smile is symmetrical.  VIII: hearing intact to voice. IX, X: Phonation is normal.  XII: tongue is midline without fasciculations. Motor: 5/5 strength to all muscle groups tested.  Tone: is normal and bulk is normal Sensation- Intact to light touch bilaterally. Extinction absent to light touch to DSS.   Coordination: FTN intact bilaterally Gait- deferred  DISCHARGE PLAN Disposition:  Home with home health PT/OT clopidogrel 75 mg daily for secondary stroke prevention Ongoing stroke risk factor control by Primary Care Physician at time of discharge Follow-up  PCP in 2 weeks. Follow-up in [facility] in 4-8 weeks, office to schedule an appointment.   35 minutes were spent preparing discharge.  Patient seen and examined by NP/APP with MD. MD to update note as needed.   Janine Ores, DNP, FNP-BC [facility] Pager: [phone]  ATTENDING NOTE: I reviewed above note and agree with the assessment and plan. Pt was seen and examined.   No family at bedside.  Patient sitting in chair, eager to go home.  MRI negative for acute stroke.  Possible TIA, however cannot rule out anxiety versus conversion disorder since he had similar presentation last year status post tPA and MRI negative also.  However, patient denies any anxiety, depression at home. Continue plavix and statin on discharge.  Follow up with [facility] in 4 weeks.   For detailed assessment and plan, please refer to above as I have made changes wherever appropriate.   Rosalin Hawking, MD PhD Stroke Neurology 02/15/2022 2:36 PM

## 2022-02-15 NOTE — Progress Notes (Signed)
Physical Therapy Treatment ?Patient Details ?Name: Francisco Collier ?MRN: [id number] ?DOB: 1940/12/14 ?Today's Date: 02/15/2022 ? ? ?History of Present Illness Pt is a 81 yo male who presents with RUE and LE weakness, given TNK.  MRI neg for acute findings. PMHx of CVA with TPA, CAD, MI s/p PTCA of RCA in 2005 with chronic dual antiplatelet therapy, DM II, GERD, HLD, HTN, and COPD. ? ?  ?PT Comments  ? ? Pt doing much better today without effects of Ativan. Mild STM deficits noted but otherwise intact cognitively. Pt reports feeling generally weaker than his baseline and noted to reach for stable surfaces when ambulating without AD. With RW, pt steady and ambulated 200' with supervision, pace appropriate for household ambulation. Recommend RW for home as well as HHPT for balance and strengthening program. From a mobility standpoint pt ready for d/c home. Brother and neighbors to check on him.  ?   ?Recommendations for follow up therapy are one component of a multi-disciplinary discharge planning process, led by the attending physician.  Recommendations may be updated based on patient status, additional functional criteria and insurance authorization. ? ?Follow Up Recommendations ? Home health PT ?  ?  ?Assistance Recommended at Discharge Intermittent Supervision/Assistance  ?Patient can return home with the following Assistance with cooking/housework;Assist for transportation ?  ?Equipment Recommendations ? Rolling walker (2 wheels)  ?  ?Recommendations for Other Services   ? ? ?  ?Precautions / Restrictions Precautions ?Precautions: Fall ?Precaution Comments: pt fell to L, hitting L shoulder, the day before admission. He in unsure why, denies dizziness. Reports he got himself back up ?Restrictions ?Weight Bearing Restrictions: No  ?  ? ?Mobility ? Bed Mobility ?  ?  ?  ?  ?  ?  ?  ?General bed mobility comments: pt received in bathroom after OT ?  ? ?Transfers ?Overall transfer level: Modified independent ?Equipment  used: Rolling walker (2 wheels) ?Transfers: Sit to/from Stand ?Sit to Stand: Modified independent (Device/Increase time) ?  ?  ?  ?  ?  ?General transfer comment: pt stood from toilet and bed safely, education given on use of RW and pt able to verbalize understanding ?  ? ?Ambulation/Gait ?Ambulation/Gait assistance: Supervision ?Gait Distance (Feet): 200 Feet ?Assistive device: Rolling walker (2 wheels) ?Gait Pattern/deviations: Step-through pattern, Trunk flexed ?Gait velocity: WFL ?Gait velocity interpretation: >2.62 ft/sec, indicative of community ambulatory ?  ?General Gait Details: pt tends to flex at trunk (has h/o back pain) but understands to stay close to RW and avoid flexion. Pt preferring RW right now due to feeling weaker than his baseline ? ? ?Stairs ?Stairs:  (pt has ramp) ?  ?  ?  ?  ? ? ?Wheelchair Mobility ?  ? ?Modified Rankin (Stroke Patients Only) ?  ? ? ?  ?Balance Overall balance assessment: Needs assistance, History of Falls ?Sitting-balance support: Feet supported, No upper extremity supported ?Sitting balance-Leahy Scale: Good ?Sitting balance - Comments: pt maintaining midline in sitting today ?  ?Standing balance support: Bilateral upper extremity supported, During functional activity ?Standing balance-Leahy Scale: Fair ?Standing balance comment: light wt on RW, pt much more steady with at least unilateral support ?  ?  ?  ?  ?  ?  ?  ?  ?  ?  ?  ?  ? ?  ?Cognition Arousal/Alertness: Awake/alert ?Behavior During Therapy: [facility] for tasks assessed/performed ?Overall Cognitive Status: History of cognitive impairments - at baseline ?  ?  ?  ?  ?  ?  ?  ?  ?  ?  ?  ?  ?  ?  ?  ?  ?  General Comments: pt much improved from yesterday ?  ?  ? ?  ?Exercises   ? ?  ?General Comments General comments (skin integrity, edema, etc.): VSS ?  ?  ? ?Pertinent Vitals/Pain Pain Assessment ?Pain Assessment: Faces ?Faces Pain Scale: Hurts a little bit ?Pain Location: back ?Pain Descriptors / Indicators:  Aching ?Pain Intervention(s): Monitored during session  ? ? ?Home Living Family/patient expects to be discharged to:: Private residence ?Living Arrangements: Alone ?Available Help at Discharge: Family;Friend(s);Available PRN/intermittently ?Type of Home: Mobile home ?Home Access: Ramped entrance ?  ?  ?  ?Home Layout: One level ?Home Equipment: Wheelchair - manual;Cane - single Barista (2 wheels);Shower seat ?Additional Comments: lives with his [place], Peanut. Brother lives about 15 minutes away and checks on him. Has some neighbors that can help as well.  ?  ?Prior Function    ?  ?  ?   ? ?PT Goals (current goals can now be found in the care plan section) Acute Rehab PT Goals ?Patient Stated Goal: return home ?PT Goal Formulation: With patient ?Time For Goal Achievement: 02/28/22 ?Potential to Achieve Goals: Good ?Progress towards PT goals: Progressing toward goals ? ?  ?Frequency ? ? ? Min 3X/week ? ? ? ?  ?PT Plan Current plan remains appropriate;Equipment recommendations need to be updated;Frequency needs to be updated  ? ? ?Co-evaluation   ?  ?  ?  ?  ? ?  ?AM-PAC PT "6 Clicks" Mobility   ?Outcome Measure ? Help needed turning from your back to your side while in a flat bed without using bedrails?: None ?Help needed moving from lying on your back to sitting on the side of a flat bed without using bedrails?: None ?Help needed moving to and from a bed to a chair (including a wheelchair)?: None ?Help needed standing up from a chair using your arms (e.g., wheelchair or bedside chair)?: None ?Help needed to walk in hospital room?: A Little ?Help needed climbing 3-5 steps with a railing? : A Lot ?6 Click Score: 21 ? ?  ?End of Session Equipment Utilized During Treatment: Gait belt ?Activity Tolerance: Treatment limited secondary to medical complications (Comment);Patient tolerated treatment well ?Patient left: with call bell/phone within reach;in chair ?Nurse Communication: Mobility status ?PT Visit  Diagnosis: Unsteadiness on feet (R26.81);Difficulty in walking, not elsewhere classified (R26.2);Dizziness and giddiness (R42);Pain ?Pain - Right/Left: Right ?Pain - part of body:  (back) ?  ? ? ?Time: 3845-3646 ?PT Time Calculation (min) (ACUTE ONLY): 16 min ? ?Charges:  $Gait Training: 8-22 mins          ?          ? ?Leighton Roach, PT  ?Acute Rehab Services ? Pager [phone] ?Office [phone] ? ? ? ?[place] ?02/15/2022, 11:20 AM ? ?

## 2022-02-15 NOTE — Evaluation (Signed)
Occupational Therapy Evaluation ?Patient Details ?Name: Francisco Collier ?MRN: [id number] ?DOB: July 01, 1941 ?Today's Date: 02/15/2022 ? ? ?History of Present Illness Pt is a 81 yo male who presents with RUE and LE weakness, given TNK.  MRI neg for acute findings. PMHx of CVA with TPA, CAD, MI s/p PTCA of RCA in 2005 with chronic dual antiplatelet therapy, DM II, GERD, HLD, HTN, and COPD.  ? ?Clinical Impression ?  ?PTA pt lives alone with his dog (Peanut), drives and does his own finances/medication management. Pt demonstrates mild unsteadiness and deficits with STM, however pt states he has "some problems with memory" at baseline. Feels more comfortable using a RW. Given he lives alone and fall PTA, recommend follow up with HHOT to maximize functional level of independence and reduce risk of falls. Discussed recommendation for a fall alert system. Pt demonstrates understanding. ?   ? ?Recommendations for follow up therapy are one component of a multi-disciplinary discharge planning process, led by the attending physician.  Recommendations may be updated based on patient status, additional functional criteria and insurance authorization.  ? ?Follow Up Recommendations ? Home health OT  ?  ?Assistance Recommended at Discharge Intermittent Supervision/Assistance  ?Patient can return home with the following A little help with bathing/dressing/bathroom;Assistance with cooking/housework;Assist for transportation;Help with stairs or ramp for entrance ? ?  ?Functional Status Assessment ? Patient has had a recent decline in their functional status and demonstrates the ability to make significant improvements in function in a reasonable and predictable amount of time.  ?Equipment Recommendations ? Other (comment);Tub/shower seat (RW)  ?  ?Recommendations for Other Services   ? ? ?  ?Precautions / Restrictions Precautions ?Precautions: Fall ?Precaution Comments: pt fell to L, hitting L shoulder, the day before admission. He in  unsure why, denies dizziness. Reports he got himself back up ?Restrictions ?Weight Bearing Restrictions: No  ? ?  ? ?Mobility Bed Mobility ?Overal bed mobility: Modified Independent ?  ?  ?  ?  ?  ?  ?General bed mobility comments: pt able to get into bed independently. However, once in supine continued to have such dizziness that he could not read menu to order lunch. Assisted him in calling and then cafe reading off his options ?  ? ?Transfers ?Overall transfer level: Needs assistance ?Equipment used: Rolling walker (2 wheels) ?Transfers: Sit to/from Stand ?Sit to Stand: Supervision ?  ?  ?  ?  ?  ?  ?  ? ?  ?Balance Overall balance assessment: Needs assistance, History of Falls (fell Friday before admission) ?  ?Sitting balance-Leahy Scale: Good ?  ?  ?  ?Standing balance-Leahy Scale: Fair ?  ?  ?  ?  ?  ?  ?  ?  ?  ?  ?  ?  ?   ? ?ADL either performed or assessed with clinical judgement  ? ?ADL Overall ADL's : Needs assistance/impaired ?  ?  ?  ?  ?  ?  ?  ?  ?  ?  ?  ?  ?  ?  ?  ?  ?  ?  ?Functional mobility during ADLs: Min guard;Rolling walker (2 wheels) ?General ADL Comments: overall set up for UB ADL adn minguard for LB ADL; feels more secure with RW; ableto decribe meds he is taking  ? ? ? ?Vision Baseline Vision/History: 1 Wears glasses ?   ?   ?Perception   ?  ?Praxis Praxis ?Praxis-Other Comments: WFL ?  ? ?Pertinent Vitals/Pain Pain Assessment ?  Pain Assessment: Faces ?Faces Pain Scale: Hurts little more ?Pain Location: back and L shoulder ?Pain Descriptors / Indicators: Aching, Sore ?Pain Intervention(s): Limited activity within patient's tolerance  ? ? ? ?Hand Dominance Right ?  ?Extremity/Trunk Assessment Upper Extremity Assessment ?Upper Extremity Assessment: Overall WFL for tasks assessed ?  ?Lower Extremity Assessment ?Lower Extremity Assessment: Defer to PT evaluation ?  ?Cervical / Trunk Assessment ?Cervical / Trunk Assessment: Normal ?  ?Communication Communication ?Communication: No  difficulties ?  ?Cognition Arousal/Alertness: Awake/alert ?Behavior During Therapy: [facility] for tasks assessed/performed ?Overall Cognitive Status: Within Functional Limits for tasks assessed ?  ?  ?  ?  ?  ?  ?  ?  ?  ?  ?  ?  ?  ?  ?  ?  ?General Comments: Scored a 4 on the Short Blessed Test. 0-4 is considered within normal range. Deficts noted with STM and recting months of the year in reverse order (omitted 1 month). Pt endorses memory problems at baseline ?  ?  ?General Comments  VSS ? ?  ?Exercises   ?  ?Shoulder Instructions    ? ? ?Home Living Family/patient expects to be discharged to:: Private residence ?Living Arrangements: Alone ?Available Help at Discharge: Family;Friend(s);Available PRN/intermittently ?Type of Home: Mobile home ?Home Access: Ramped entrance ?  ?  ?Home Layout: One level ?  ?  ?Bathroom Shower/Tub: Tub/shower unit ?  ?Bathroom Toilet: Handicapped height ?Bathroom Accessibility: Yes ?How Accessible: Accessible via walker ?Home Equipment: Wheelchair - manual;Cane - single Barista (2 wheels);Shower seat ?  ?Additional Comments: lives with his [place], Peanut. Brother lives about 15 minutes away and checks on him. Has some neighbors that can help as well. ?  ? ?  ?Prior Functioning/Environment Prior Level of Function : Independent/Modified Independent;Driving;History of Falls (last six months) ?  ?  ?  ?  ?  ?  ?Mobility Comments: reports that he usually uses no AD, grabs SPC if he feels he needs it ?ADLs Comments: fall Friday night night before; hurt his L knee and shoulder ?  ? ?  ?  ?OT Problem List: Impaired balance (sitting and/or standing);Decreased safety awareness;Decreased knowledge of use of DME or AE;Pain ?  ?   ?OT Treatment/Interventions: Self-care/ADL training;Therapeutic exercise;Neuromuscular education;Energy conservation;DME and/or AE instruction;Therapeutic activities;Cognitive remediation/compensation;Visual/perceptual remediation/compensation;Patient/family  education;Balance training  ?  ?OT Goals(Current goals can be found in the care plan section) Acute Rehab OT Goals ?Patient Stated Goal: to go home today ?OT Goal Formulation: With patient ?Time For Goal Achievement: 03/01/22 ?Potential to Achieve Goals: Good  ?OT Frequency: Min 2X/week ?  ? ?Co-evaluation   ?  ?  ?  ?  ? ?  ?AM-PAC OT "6 Clicks" Daily Activity     ?Outcome Measure Help from another person eating meals?: None ?Help from another person taking care of personal grooming?: A Little ?Help from another person toileting, which includes using toliet, bedpan, or urinal?: A Little ?Help from another person bathing (including washing, rinsing, drying)?: A Little ?Help from another person to put on and taking off regular upper body clothing?: A Little ?Help from another person to put on and taking off regular lower body clothing?: A Little ?6 Click Score: 19 ?  ?End of Session Equipment Utilized During Treatment: Rolling walker (2 wheels);Gait belt ?Nurse Communication: Mobility status;Other (comment) (DC needs) ? ?Activity Tolerance: Patient tolerated treatment well ?Patient left: Other (comment) (in bathroom; PT to work with pt) ? ?OT Visit Diagnosis: Unsteadiness on feet (R26.81);History of falling (  Z91.81);Other symptoms and signs involving cognitive function;Pain ?Pain - Right/Left: Left ?Pain - part of body: Shoulder;Knee  ?              ?Time: 7125-2712 ?OT Time Calculation (min): 26 min ?Charges:  OT General Charges ?$OT Visit: 1 Visit ?OT Evaluation ?$OT Eval Moderate Complexity: 1 Mod ?OT Treatments ?$Self Care/Home Management : 8-22 mins ? ?[facility], OT/L  ? ?Acute OT Clinical Specialist ?Acute Rehabilitation Services ?Pager [phone] ?Office [phone]  ? ?Taylin Leder,H[place] ?02/15/2022, 10:26 AM ?

## 2022-02-18 ENCOUNTER — Other Ambulatory Visit: Payer: Self-pay

## 2022-02-18 NOTE — Patient Outreach (Signed)
[facility]) Care Management ? ?02/18/2022 ? ?[name] ?04-Dec-1941 ?[id number] ? ? ?EMMI- Stroke ?RED ON EMMI ALERT ?Day # 1 ?Date: 02/17/22 ?Red Alert Reason:  Scheduled follow up appointment? No ? ?Outreach attempt: No answer.  HIPAA compliant voice message left.   ? ? ?Plan: ?RN CM will attempt again within 4 business days.   ? ?Jone Baseman, RN, MSN ?[facility] Care Management ?Care Management Coordinator ?Direct Line [phone] ?Toll Free: [phone]  ?Fax: [phone] ? ?

## 2022-02-18 NOTE — Patient Outreach (Addendum)
Received a red flag Emmi stroke notification for [name] a Francisco Collier patient. ?I have assigned Francisco Billings, RN to call for follow up and determine if there are any Case Management needs.  ?  ?Arville Care, CBCS, CMAA ?[place] Management Assistant ?[facility] Management ?[phone]  ?

## 2022-02-21 ENCOUNTER — Other Ambulatory Visit: Payer: Self-pay

## 2022-02-21 NOTE — Patient Outreach (Signed)
[facility]) Care Management ? ?02/21/2022 ? ?Francisco Collier ?04-17-1941 ?[id number] ? ? ?EMMI- Stroke ?RED ON EMMI ALERT ?Day # 1 ?Date: 02/17/22 ?Red Alert Reason:  Scheduled follow up appointment? No ?  ?Outreach attempt: spoke with patient.  He reports he is slow going this AM due to arthritis. He states otherwise he is ok.  Discussed red alert. Patient states Dr. Rex Kras was his PCP retired and he is looking for a new PCP. He states he had Dr. Orlinda Blalock in mind and doctor in [place].  Contact information given for Dr. Damita Dunnings and [facility] in [place].  Patient states he will call them and go from there. Patient is agreeable to CM calling him to check to see if he was able to make an appointment to establish PCP.   ? ?Plan: RN CM will outreach patient later this week for follow up. ? ?Francisco Baseman, RN, MSN ?[facility] Care Management ?Care Management Coordinator ?Direct Line [phone] ?Toll Free: [phone]  ?Fax: [phone] ? ?

## 2022-02-23 ENCOUNTER — Other Ambulatory Visit: Payer: Self-pay

## 2022-02-23 NOTE — Patient Outreach (Signed)
[facility]) Care Management ? ?02/23/2022 ? ?Francisco Collier ?[birth year] ?[id number] ? ? ?Telephone call to patient for follow up on finding a PCP.  No answer.  HIPAA compliant voice message left. ? ?Plan: RN CM will attempt patient again within 4 business days. ? ?Jone Baseman, RN, MSN ?[facility] Care Management ?Care Management Coordinator ?Direct Line [phone] ?Toll Free: [phone]  ?Fax: [phone] ? ?

## 2022-02-28 ENCOUNTER — Other Ambulatory Visit: Payer: Self-pay

## 2022-02-28 NOTE — Patient Outreach (Signed)
[facility]) Care Management ? ?02/28/2022 ? ?[name] ?December 28, 1940 ?[id number] ? ? ?Telephone call to patient for follow up. Patient reports his arthritis is acting up but otherwise he is okay. Asked patient about him finding a PCP.  He states he has one and has seen her last week.  She states Ferd Hibbs, NP is his new PCP.  Encouraged patient to maintain a relationship with his PCP. He verbalized understanding.  No concerns.  ? ?Plan: RN CM will close case as patient does not have a [facility] PCP.   ? ?Jone Baseman, RN, MSN ?[facility] Care Management ?Care Management Coordinator ?Direct Line [phone] ?Toll Free: [phone]  ?Fax: [phone] ? ?

## 2022-03-10 ENCOUNTER — Encounter (HOSPITAL_COMMUNITY): Payer: Self-pay | Admitting: Radiology

## 2022-04-06 ENCOUNTER — Encounter (HOSPITAL_COMMUNITY): Payer: Self-pay

## 2022-04-06 ENCOUNTER — Emergency Department (HOSPITAL_COMMUNITY)
Admission: EM | Admit: 2022-04-06 | Discharge: 2022-04-06 | Disposition: A | Discharge status: Home / Self Care | Payer: Medicare HMO | Attending: Emergency Medicine | Admitting: Emergency Medicine

## 2022-04-06 ENCOUNTER — Other Ambulatory Visit: Payer: Self-pay

## 2022-04-06 ENCOUNTER — Emergency Department (HOSPITAL_COMMUNITY): Payer: Medicare HMO

## 2022-04-06 DIAGNOSIS — R55 Syncope and collapse: Secondary | ICD-10-CM | POA: Diagnosis not present

## 2022-04-06 DIAGNOSIS — J449 Chronic obstructive pulmonary disease, unspecified: Secondary | ICD-10-CM | POA: Insufficient documentation

## 2022-04-06 DIAGNOSIS — Z8673 Personal history of transient ischemic attack (TIA), and cerebral infarction without residual deficits: Secondary | ICD-10-CM

## 2022-04-06 DIAGNOSIS — I2510 Atherosclerotic heart disease of native coronary artery without angina pectoris: Secondary | ICD-10-CM | POA: Diagnosis not present

## 2022-04-06 DIAGNOSIS — R059 Cough, unspecified: Secondary | ICD-10-CM | POA: Insufficient documentation

## 2022-04-06 DIAGNOSIS — I10 Essential (primary) hypertension: Secondary | ICD-10-CM | POA: Diagnosis present

## 2022-04-06 DIAGNOSIS — R531 Weakness: Secondary | ICD-10-CM | POA: Diagnosis not present

## 2022-04-06 DIAGNOSIS — Z79899 Other long term (current) drug therapy: Secondary | ICD-10-CM | POA: Diagnosis not present

## 2022-04-06 DIAGNOSIS — R42 Dizziness and giddiness: Secondary | ICD-10-CM | POA: Diagnosis not present

## 2022-04-06 HISTORY — DX: Dizziness and giddiness: R42

## 2022-04-06 LAB — COMPREHENSIVE METABOLIC PANEL
ALT: 20 U/L (ref 0–44)
AST: 17 U/L (ref 15–41)
Albumin: 3.5 g/dL (ref 3.5–5.0)
Alkaline Phosphatase: 49 U/L (ref 38–126)
Anion gap: 9 (ref 5–15)
BUN: 12 mg/dL (ref 8–23)
CO2: 26 mmol/L (ref 22–32)
Calcium: 9.1 mg/dL (ref 8.9–10.3)
Chloride: 105 mmol/L (ref 98–111)
Creatinine, Ser: 0.92 mg/dL (ref 0.61–1.24)
GFR, Estimated: >60 mL/min (ref >60)
Glucose, Bld: 116 mg/dL — ABNORMAL HIGH (ref 70–99)
Potassium: 3.8 mmol/L (ref 3.5–5.1)
Sodium: 140 mmol/L (ref 135–145)
Total Bilirubin: 0.7 mg/dL (ref 0.3–1.2)
Total Protein: 6.2 g/dL — ABNORMAL LOW (ref 6.5–8.1)

## 2022-04-06 LAB — CBC WITH DIFFERENTIAL/PLATELET
Abs Immature Granulocytes: 0.02 10*3/uL (ref 0.00–0.07)
Basophils Absolute: 0 10*3/uL (ref 0.0–0.1)
Basophils Relative: 0 %
Eosinophils Absolute: 0.1 10*3/uL (ref 0.0–0.5)
Eosinophils Relative: 1 %
HCT: 41.1 % (ref 39.0–52.0)
Hemoglobin: 13.6 g/dL (ref 13.0–17.0)
Immature Granulocytes: 0 %
Lymphocytes Relative: 20 %
Lymphs Abs: 1.7 10*3/uL (ref 0.7–4.0)
MCH: 29.4 pg (ref 26.0–34.0)
MCHC: 33.1 g/dL (ref 30.0–36.0)
MCV: 88.8 fL (ref 80.0–100.0)
Monocytes Absolute: 0.6 10*3/uL (ref 0.1–1.0)
Monocytes Relative: 7 %
Neutro Abs: 6.3 10*3/uL (ref 1.7–7.7)
Neutrophils Relative %: 72 %
Platelets: 307 10*3/uL (ref 150–400)
RBC: 4.63 MIL/uL (ref 4.22–5.81)
RDW: 13.1 % (ref 11.5–15.5)
WBC: 8.7 10*3/uL (ref 4.0–10.5)
nRBC: 0 % (ref 0.0–0.2)

## 2022-04-06 LAB — TROPONIN I (HIGH SENSITIVITY)
Troponin I (High Sensitivity): 6 ng/L (ref <18)
Troponin I (High Sensitivity): 6 ng/L (ref <18)

## 2022-04-06 LAB — D-DIMER, QUANTITATIVE: D-Dimer, Quant: 0.47 ug/mL-FEU (ref 0.00–0.50)

## 2022-04-06 MED ORDER — LORAZEPAM 2 MG/ML IJ SOLN
1.0000 mg | Freq: Once | INTRAMUSCULAR | Status: AC
  Administered 2022-04-06: 1 mg via INTRAVENOUS
  Filled 2022-04-06: qty 1

## 2022-04-06 MED ORDER — KETOROLAC TROMETHAMINE 30 MG/ML IJ SOLN
15.0000 mg | Freq: Once | INTRAMUSCULAR | Status: AC
Start: 2022-04-06 — End: 2022-04-06
  Administered 2022-04-06: 15 mg via INTRAVENOUS
  Filled 2022-04-06: qty 1

## 2022-04-06 MED ORDER — MECLIZINE HCL 25 MG PO TABS: 25.0000 mg | ORAL_TABLET | Freq: Three times a day (TID) | ORAL | 0 refills | Status: DC | PRN

## 2022-04-06 MED ORDER — IOHEXOL 350 MG/ML SOLN
75.0000 mL | Freq: Once | INTRAVENOUS | Status: AC | PRN
Start: 2022-04-06 — End: 2022-04-06
  Administered 2022-04-06: 75 mL via INTRAVENOUS

## 2022-04-06 MED ORDER — ONDANSETRON HCL 4 MG/2ML IJ SOLN
4.0000 mg | Freq: Once | INTRAMUSCULAR | Status: AC
  Administered 2022-04-06: 4 mg via INTRAVENOUS
  Filled 2022-04-06: qty 2

## 2022-04-06 MED ORDER — METOCLOPRAMIDE HCL 5 MG/ML IJ SOLN
10.0000 mg | Freq: Once | INTRAMUSCULAR | Status: AC
  Administered 2022-04-06: 10 mg via INTRAVENOUS
  Filled 2022-04-06: qty 2

## 2022-04-06 MED ORDER — LACTATED RINGERS IV BOLUS
1000.0000 mL | Freq: Once | INTRAVENOUS | Status: AC
Start: 2022-04-06 — End: 2022-04-06
  Administered 2022-04-06: 1000 mL via INTRAVENOUS

## 2022-04-06 NOTE — Subjective & Objective (Signed)
Pt reports around 77 Am was sitting in his car and gotten suddenly vertiginous improved with closing his eyes, no hx of vertigo but does have prior history of CVAs.  Also noted trouble with speaking no otherwise focalized neurological signs no weakness ?He did felt somewhat lightheaded as well no double vision no blurry vision no facial droop that he could tell ?He has chronic right lower extremity weakness no new findings no chest pain or shortness of breath ?

## 2022-04-06 NOTE — Discharge Instructions (Addendum)
Your testing today shows stable narrowing of blood vessels in your neck.  There is no evidence of stroke.  Take the dizzy medication as prescribed and follow-up with your primary doctor.  Return to the ED with worsening symptoms, chest pain, shortness of breath, unilateral weakness, difficulty speaking or any other concerns. ?

## 2022-04-06 NOTE — ED Notes (Signed)
To mri 

## 2022-04-06 NOTE — Assessment & Plan Note (Signed)
Continue statin and restart beta-blocker when able ?

## 2022-04-06 NOTE — ED Notes (Signed)
Patient transported to MRI 

## 2022-04-06 NOTE — ED Notes (Signed)
Ambulated out to hallway with walker. Patient stated he was ready to go back to room. When asked if he was dizzy, he stated "I'm not dizzy, just a little light headed".  Rates headache at 3/10. No obvious distress. Gait unsteady without walker.  ?

## 2022-04-06 NOTE — Assessment & Plan Note (Signed)
Patient is already on statin and Plavix defer to neurology if needs to adjust medications further pending work-up ?

## 2022-04-06 NOTE — Assessment & Plan Note (Signed)
Allow permissive hypertension 

## 2022-04-06 NOTE — ED Triage Notes (Signed)
Patient states he does have a history of vertigo ?

## 2022-04-06 NOTE — ED Provider Notes (Addendum)
?[facility] ?Provider Note ? ? ?CSN: [id number] ?Arrival date & time: 04/06/22  1231 ? ?  ? ?History ? ?Chief Complaint  ?Patient presents with  ? Dizziness  ? ? ?MICHAL CALLICOTT is a 81 y.o. male. ? ? ?Dizziness ? 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 ?The patient states that at 41, he had a sudden onset of difficulty talking and near syncope. He felt lightheaded as if he was going to pass out but did not. Symptoms have mostly resolved. He has been having a cough for the past few days. He denies any vision changes. No double vision or blurry vision. Denied room spinning dizziness but did feel as if symptoms were vertiginous as he has experienced this before. No facial droop. Feels like he has a 'lump' in his throat. He chronically has RLE weakness but denies and new weakness in the arms or legs. Denies chest pain.  ? ?Home Medications ?Prior to Admission medications   ?Medication Sig Start Date End Date Taking? Authorizing Provider  ?amLODipine (NORVASC) 10 MG tablet Take 10 mg by mouth daily. 10/28/20  Yes [provider]  ?Ascorbic Acid (VITAMIN C PO) Take 1 capsule by mouth daily.   Yes [provider]  ?atorvastatin (LIPITOR) 10 MG tablet Take 10 mg by mouth daily.   Yes [provider]  ?clopidogrel (PLAVIX) 75 MG tablet Take 1 tablet (75 mg total) by mouth daily. 02/16/22  Yes Janine Ores, NP  ?Cyanocobalamin (VITAMIN B-12 PO) Take 1 capsule by mouth daily.   Yes [provider]  ?fenofibrate 160 MG tablet Take 160 mg by mouth daily.   Yes [provider]  ?fluticasone (FLONASE) 50 MCG/ACT nasal spray Place 1 spray into both nostrils 2 (two) times daily as needed for allergies or rhinitis.    Yes [provider]   ?lisinopril (PRINIVIL) 10 MG tablet Take 1 tablet (10 mg total) by mouth daily. 11/13/15  Yes Charolette Forward, MD  ?metoprolol tartrate (LOPRESSOR) 25 MG tablet Take 0.5 tablets (12.5 mg total) by mouth 2 (two) times daily. 11/13/15  Yes Charolette Forward, MD  ?Multiple Vitamins-Minerals (ZINC PO) Take 1 capsule by mouth daily.   Yes [provider]  ?Omega-3 Fatty Acids (FISH OIL PO) Take 1 capsule by mouth daily.   Yes [provider]  ?oxyCODONE-acetaminophen (PERCOCET) 10-325 MG tablet Take 0.5 tablets by mouth 3 (three) times daily as needed for pain. 12/23/20  Yes [provider]  ?pantoprazole (PROTONIX) 20 MG tablet Take 1 tablet (20 mg total) by mouth daily. ?Patient taking differently: Take 20 mg by mouth daily as needed for heartburn or indigestion. 10/06/20  Yes Isla Pence, MD  ?VITAMIN D PO Take 1 capsule by mouth daily.   Yes [provider]  ?diclofenac Sodium (VOLTAREN) 1 % GEL Apply 2 g topically 4 (four) times daily. ?Patient not taking: Reported on 04/06/2022 01/20/22   [provider]  ?meclizine (ANTIVERT) 25 MG tablet Take 1 tablet (25 mg total) by mouth 3 (three) times daily as needed for dizziness. ?Patient not taking: Reported on 04/06/2022 08/08/21   Jacqlyn Larsen, PA-C  ?   ? ?Allergies    ?Codeine, Niaspan [niacin er], and Statins   ? ?Review of Systems   ?Review of Systems  ?Neurological:  Positive for  light-headedness.  ?All other systems reviewed and are negative. ? ?Physical Exam ?Updated Vital Signs ?BP 127/74   Pulse 81   Temp 98.2 ?F (36.8 ?C) (Oral)   Resp (!) 23   Ht '6\' 2"'$  (1.88 m)   Wt 95.3 kg   SpO2 94%   BMI 26.96 kg/m?  ?Physical Exam ?Vitals and nursing note reviewed.  ?Constitutional:   ?   General: He is not in acute distress. ?HENT:  ?   Head: Normocephalic and atraumatic.  ?   Mouth/Throat:  ?   Mouth: Mucous membranes are moist.  ?Eyes:  ?   Conjunctiva/sclera: Conjunctivae normal.  ?   Pupils: Pupils are equal, round, and  reactive to light.  ?Cardiovascular:  ?   Rate and Rhythm: Normal rate and regular rhythm.  ?   Pulses: Normal pulses.  ?   Heart sounds: Normal heart sounds.  ?Pulmonary:  ?   Effort: Pulmonary effort is normal. No respiratory distress.  ?   Breath sounds: Normal breath sounds.  ?Abdominal:  ?   General: There is no distension.  ?   Tenderness: There is no guarding.  ?Musculoskeletal:     ?   General: No deformity or signs of injury.  ?   Cervical back: Neck supple.  ?Skin: ?   Findings: No lesion or rash.  ?Neurological:  ?   General: No focal deficit present.  ?   Mental Status: He is alert. Mental status is at baseline.  ?   Comments: MENTAL STATUS EXAM:    ?Orientation: Alert and oriented to person, place and time.  ?Memory: Cooperative, follows commands well.  ?Language: Speech is clear and language is normal.  ? ?CRANIAL NERVES:    ?CN 2 (Optic): Visual fields intact to confrontation.  ?CN 3,4,6 (EOM): Pupils equal and reactive to light. Full extraocular eye movement without nystagmus.  ?CN 5 (Trigeminal): Facial sensation is normal, no weakness of masticatory muscles.  ?CN 7 (Facial): No facial weakness or asymmetry.  ?CN 8 (Auditory): Auditory acuity grossly normal.  ?CN 9,10 (Glossophar): The uvula is midline, the palate elevates symmetrically.  ?CN 11 (spinal access): Normal sternocleidomastoid and trapezius strength.  ?CN 12 (Hypoglossal): The tongue is midline. No atrophy or fasciculations..  ? ?MOTOR:  Muscle Strength: 5/5RUE, 5/5LUE, 5/5RLE, 4/5LLE..  ? ?COORDINATION:   Intact finger-to-nose, no tremor, no pronator drift..  ? ?SENSATION:   Intact to light touch all four extremities. ?  ?  ? ? ?ED Results / Procedures / Treatments   ?Labs ?(all labs ordered are listed, but only abnormal results are displayed) ?Labs Reviewed  ?COMPREHENSIVE METABOLIC PANEL - Abnormal; Notable for the following components:  ?    Result Value  ? Glucose, Bld 116 (*)   ? Total Protein 6.2 (*)   ? All other components  within normal limits  ?CBC WITH DIFFERENTIAL/PLATELET  ?D-DIMER, QUANTITATIVE  ?TROPONIN I (HIGH SENSITIVITY)  ?TROPONIN I (HIGH SENSITIVITY)  ?TROPONIN I (HIGH SENSITIVITY)  ? ? ?EKG ?EKG Interpretation ? ?Date/Time:  Wednesday April 06 2022 12:48:25 EDT ?Ventricular Rate:  83 ?PR Interval:  196 ?QRS Duration: 74 ?QT Interval:  362 ?QTC Calculation: 426 ?R Axis:   71 ?Text Interpretation: Sinus rhythm Minimal ST depression, anterolateral leads Minimal ST elevation, inferior leads No significant change was found Confirmed by Ezequiel Essex [phone]) on 04/06/2022 3:19:20 PM ? ?Radiology ?CT ANGIO HEAD NECK W WO CM ? ?Result Date: 04/06/2022 ?CLINICAL DATA:  Dizziness EXAM: CT ANGIOGRAPHY HEAD AND NECK TECHNIQUE:  Multidetector CT imaging of the head and neck was performed using the standard protocol during bolus administration of intravenous contrast. Multiplanar CT image reconstructions and MIPs were obtained to evaluate the vascular anatomy. Carotid stenosis measurements (when applicable) are obtained utilizing NASCET criteria, using the distal internal carotid diameter as the denominator. RADIATION DOSE REDUCTION: This exam was performed according to the departmental dose-optimization program which includes automated exposure control, adjustment of the mA and/or kV according to patient size and/or use of iterative reconstruction technique. CONTRAST:  44m OMNIPAQUE IOHEXOL 350 MG/ML SOLN COMPARISON:  Brain MRI 02/14/2022, CT/CTA head and neck 02/13/2022 FINDINGS: CT HEAD FINDINGS Brain: There is no evidence of acute intracranial hemorrhage, extra-axial fluid collection, or acute infarct. Mild parenchymal volume loss with prominence of the extra-axial CSF spaces overlying the frontal lobes is unchanged going back multiple prior studies. Gray-white differentiation is preserved. Patchy hypodensity in the subcortical and periventricular white matter likely reflects sequela of mild chronic white matter microangiopathy.  There is no mass lesion.  There is no mass effect or midline shift. Vascular: See below Skull: Normal. Negative for fracture or focal lesion. Sinuses: Paranasal sinuses are clear. Orbits: The globes and orbits ar

## 2022-04-06 NOTE — H&P (Incomplete)
? ? ?[name]:[id number] DOB: [birth year] DOA: 04/06/2022 ? ? ?  ?PCP: Ferd Hibbs, NP   ?Outpatient Specialists: * NONE ?CARDS: * Dr. ?NEphrology: *  Dr. ?NEurology *   Dr. ?Pulmonary *  Dr. ? Oncology * Dr. ?GI* Dr.  Sadie Haber, [facility]) ?No care team member to display ?Urology Dr. Marland Kitchen ? ?Patient arrived to ER on 04/06/22 at 1231 ?Referred by Attending Ezequiel Essex, MD ? ? ?Patient coming from:   ? home Lives alone,   *** With family ?From facility *** ? ?Chief Complaint:   ?Chief Complaint  ?Patient presents with  ? Dizziness  ? ? ?HPI: ?Francisco Collier is a 81 y.o. male with medical history significant of HTN, HLD, CAD, CVA, GERD ?  ? ?Presented with vertigo and lightheadedness ?Pt reports around 69 Am was sitting in his car and gotten suddenly vertiginous improved with closing his eyes, no hx of vertigo but does have prior history of CVAs.  Also noted trouble with speaking no otherwise focalized neurological signs no weakness ?He did felt somewhat lightheaded as well no double vision no blurry vision no facial droop that he could tell ?He has chronic right lower extremity weakness no new findings no chest pain or shortness of breath ?  ?  ? ?Lab Results  ?Component Value Date  ? [place] NEGATIVE 02/13/2022  ? [place] NEGATIVE 01/18/2021  ? [place] NEGATIVE 12/29/2020  ? [place] NEGATIVE 10/06/2020  ? ?  ?Regarding pertinent Chronic problems:   ? ? Hyperlipidemia -  on statins Lipitor ?Lipid Panel  ?   ?Component Value Date/Time  ? CHOL 97 02/14/2022 0335  ? TRIG 76 02/14/2022 0335  ? HDL 31 (L) 02/14/2022 0335  ? CHOLHDL 3.1 02/14/2022 0335  ? VLDL 15 02/14/2022 0335  ? [address] 51 02/14/2022 0335  ? ? ? HTN on Norvasc, lisinopril, metoprolol ? ?***chronic CHF diastolic/systolic/ combined - last echo*** ? ?   CAD  - On  statin, betablocker, Plavix  ?               - *followed by cardiology ?               - last cardiac cath  ?The ASCVD Risk score (Arnett DK, et al., 2019) failed to calculate  for the following reasons: ?  The patient has a prior MI or stroke diagnosis ?  ?  ?*** Asthma -well *** controlled on home inhalers/ nebs f ?                       ***last no prior***admission  *** ?                      No ***history of intubation ? ?*** COPD - not **followed by pulmonology *** not  on baseline oxygen  *L,   ? ?*** OSA -on nocturnal oxygen, *CPAP, *noncompliant with CPAP ? ?  Hx of CVA -  with  residual deficits on   Plavix and Lipitor ? ?***A. Fib -  - CHA2DS2 vas score **** CHA2DS2/VAS Stroke Risk Points   ?   N/A >= 2 Points: High Risk  ?1 - 1.99 Points: Medium Risk  ?0 Points: Low Risk  ?  Last Change: N/A   ?  ? This score determines the patient's risk of having a stroke if the  ?patient has atrial fibrillation.  ?   ? This score is not applicable to this patient.  Components are not  ?calculated.  ?  ? current  on anticoagulation with ****Coumadin  ***Xarelto,* Eliquis,  ?*** Not on anticoagulation secondary to Risk of Falls, *** recurrent bleeding  ?       -  Rate control:  Currently controlled with ***Toprolol,  *Metoprolol,* Diltiazem, *Coreg   ?       - Rhythm control: *** amiodarone, *flecainide ? ?***Hx of DVT/PE on - anticoagulation with ****Coumadin  ***Xarelto,* Eliquis,  ?  ?  ? ? ?While in ER: ?  ? ? ? ? ?Ordered ? ?CT HEAD *** NON acute ? ?CXR - ***NON acute ? ?CTabd/pelvis - ***nonacute ? ?CTA chest - ***nonacute, no PE, * no evidence of infiltrate ? ?Following Medications were ordered in ER: ?Medications  ?ondansetron (ZOFRAN) injection 4 mg (4 mg Intravenous Given 04/06/22 1506)  ?lactated ringers bolus 1,000 mL (0 mLs Intravenous Stopped 04/06/22 1826)  ?iohexol (OMNIPAQUE) 350 MG/ML injection 75 mL (75 mLs Intravenous Contrast Given 04/06/22 1640)  ?ketorolac (TORADOL) 30 MG/ML injection 15 mg (15 mg Intravenous Given 04/06/22 1807)  ?metoCLOPramide (REGLAN) injection 10 mg (10 mg Intravenous Given 04/06/22 1807)  ?  ?_______________________________________________________ ?ER  Provider Called:     DrMarland Kitchen  ?They Recommend admit to medicine *** ?Will see in AM  ***SEEN in ER ?  ?ED Triage Vitals  ?Enc Vitals Group  ?   BP 04/06/22 1253 119/70  ?   Pulse Rate 04/06/22 1252 79  ?   Resp 04/06/22 1252 20  ?   Temp 04/06/22 1252 98.1 ?F (36.7 ?C)  ?   Temp Source 04/06/22 1252 Oral  ?   SpO2 04/06/22 1252 97 %  ?   Weight 04/06/22 1253 210 lb (95.3 kg)  ?   Height 04/06/22 1253 '6\' 2"'$  (1.88 m)  ?   Head Circumference --   ?   Peak Flow --   ?   Pain Score 04/06/22 1253 0  ?   Pain Loc --   ?   Pain Edu? --   ?   Excl. in [place]? --   ?YBOF(75)@    ? _________________________________________ ?Significant initial  Findings: ?Abnormal Labs Reviewed  ?COMPREHENSIVE METABOLIC PANEL - Abnormal; Notable for the following components:  ?    Result Value  ? Glucose, Bld 116 (*)   ? Total Protein 6.2 (*)   ? All other components within normal limits  ? ?  ?_________________________ ?Troponin ***ordered ?ECG: Ordered ?Personally reviewed by me showing: ?HR : *** ?Rhythm: *NSR, Sinus tachycardia * A.fib. W RVR, RBBB, LBBB, Paced ?Ischemic changes*nonspecific changes, no evidence of ischemic changes ?QTC* ? ? ?____________________ ?This patient meets SIRS Criteria and may be septic. ?SIRS = Systemic Inflammatory Response Syndrome ? ?Order a lactic acid level if needed AND/OR Initiate the sepsis protocol with the attached order set ?OR ?Click "Treating Associated Infection or Illness" if the patient is being treated for an infection that is a known cause of these abnormalities   ? ? ?The recent clinical data is shown below. ?Vitals:  ? 04/06/22 1514 04/06/22 1600 04/06/22 1700 04/06/22 1800  ?BP: 132/74 107/63 123/76 127/74  ?Pulse: 81 88 78 81  ?Resp: 20 (!) 28 14 (!) 23  ?Temp:   98.2 ?F (36.8 ?C)   ?TempSrc:   Oral   ?SpO2: 94% 95% 95% 94%  ?Weight:      ?Height:      ? ? ? ? ?  ?WBC ? ?   ?Component Value Date/Time  ?  WBC 8.7 04/06/2022 1502  ? LYMPHSABS 1.7 04/06/2022 1502  ? MONOABS 0.6 04/06/2022 1502  ?  EOSABS 0.1 04/06/2022 1502  ? BASOSABS 0.0 04/06/2022 1502  ? ? ?   ? ?Lactic Acid, Venous ?No results found for: LATICACIDVEN ? ? ?Procalcitonin *** Ordered ?Lactic Acid, Venous ?No results found for: LATICACIDVEN ? ? ?Procalcitonin *** Ordered ? ? UA *** no evidence of UTI  ***Pending ***not ordered ?  ?Urine analysis: ?   ?Component Value Date/Time  ? COLORURINE YELLOW 02/13/2022 1313  ? APPEARANCEUR CLEAR 02/13/2022 1313  ? LABSPEC 1.025 02/13/2022 1313  ? PHURINE 6.0 02/13/2022 1313  ? GLUCOSEU NEGATIVE 02/13/2022 1313  ? [place] NEGATIVE 02/13/2022 1313  ? [place] NEGATIVE 02/13/2022 1313  ? [place] NEGATIVE 02/13/2022 1313  ? PROTEINUR NEGATIVE 02/13/2022 1313  ? UROBILINOGEN 1.0 09/12/2015 1658  ? NITRITE NEGATIVE 02/13/2022 1313  ? LEUKOCYTESUR NEGATIVE 02/13/2022 1313  ? ? ?Results for orders placed or performed during the hospital encounter of 02/13/22  ?Resp Panel by RT-PCR (Flu A&B, Covid) Nasopharyngeal Swab     Status: None  ? Collection Time: 02/13/22  1:28 PM  ? Specimen: Nasopharyngeal Swab; Nasopharyngeal(NP) swabs in vial transport medium  ?Result Value Ref Range Status  ? SARS Coronavirus 2 by RT PCR NEGATIVE NEGATIVE Final  ?  Comment: (NOTE) ?SARS-CoV-2 target nucleic acids are NOT DETECTED. ? ?The SARS-CoV-2 RNA is generally detectable in upper respiratory ?specimens during the acute phase of infection. The lowest ?concentration of SARS-CoV-2 viral copies this assay can detect is ?138 copies/mL. A negative result does not preclude SARS-Cov-2 ?infection and should not be used as the sole basis for treatment or ?other patient management decisions. A negative result may occur with  ?improper specimen collection/handling, submission of specimen other ?than nasopharyngeal swab, presence of viral mutation(s) within the ?areas targeted by this assay, and inadequate number of viral ?copies(<138 copies/mL). A negative result must be combined with ?clinical observations, patient history, and  epidemiological ?information. The expected result is Negative. ? ?Fact Sheet for Patients:  ?EntrepreneurPulse.com.au ? ?Fact Sheet for Healthcare Providers:  ?KnowRentals.no

## 2022-04-06 NOTE — ED Triage Notes (Signed)
"  Was sitting in my chair and suddenly got very dizzy, still some dizzy now, gets better when I shut my eyes" per pt ?

## 2022-04-06 NOTE — ED Provider Notes (Signed)
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 ? ?IMPRESSION: ?1. No acute intracranial pathology. Or emergent large vessel ?occlusion. ?2. 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 No other hemodynamically ?significant stenosis. ?3. 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 ?Otherwise, patent vasculature of the neck with no other ?hemodynamically significant stenosis. ?4. 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 ? ?Discussed with Dr. Leonel Ramsay of neurology.  Patient with recent stroke work-up in March 2023.  Dr. Leonel Ramsay does not feel he needs further stroke evaluation if MRI is negative. ?Continue Plavix ? ?MRI is negative for acute infarct.  Patient is able to ambulate without difficulty and denies dizziness.  Troponin negative x2, D-dimer is negative.  Low suspicion for ACS or pulmonary embolism. ? ?We will give course of meclizine and have follow-up with neurology.  Return precautions discussed ?  ?Ezequiel Essex, MD ?04/06/22 2316 ? ?

## 2022-04-06 NOTE — ED Notes (Signed)
PO fluid tolerated well.  ? ?

## 2022-04-07 ENCOUNTER — Inpatient Hospital Stay: Payer: Self-pay | Admitting: Adult Health

## 2022-05-17 DIAGNOSIS — M545 Low back pain, unspecified: Secondary | ICD-10-CM | POA: Insufficient documentation

## 2022-05-26 ENCOUNTER — Other Ambulatory Visit: Payer: Self-pay

## 2022-05-26 NOTE — Patient Outreach (Signed)
[facility]) Care Management  05/26/2022  Francisco Collier [birth year] [phone]   First telephone outreach attempt to obtain mRS. No answer. Unable to leave message for returned call.  Philmore Pali [facility] Management Assistant [phone]

## 2022-05-30 ENCOUNTER — Other Ambulatory Visit: Payer: Self-pay

## 2022-05-30 NOTE — Patient Outreach (Signed)
[place]) Care Management  05/30/2022  COLSTON PYLE 10-Oct-1941 [phone]   Second telephone outreach attempt to obtain mRS. No answer. Unable to leave message for returned call.  Philmore Pali [facility] Management Assistant [phone]

## 2022-05-31 ENCOUNTER — Other Ambulatory Visit: Payer: Self-pay

## 2022-05-31 NOTE — Patient Outreach (Signed)
[place] [facility]) Care Management  05/31/2022  Francisco Collier [birth year] [phone]   3 outreach attempts were completed to obtain mRs. mRs could not be obtained because patient never returned my calls. mRs=7    [place] Management Assistant [phone]

## 2022-06-10 ENCOUNTER — Emergency Department (HOSPITAL_COMMUNITY): Payer: Medicare HMO

## 2022-06-10 ENCOUNTER — Emergency Department (HOSPITAL_COMMUNITY)
Admission: EM | Admit: 2022-06-10 | Discharge: 2022-06-10 | Discharge status: Against Medical Advice | Payer: Medicare HMO | Attending: Emergency Medicine | Admitting: Emergency Medicine

## 2022-06-10 ENCOUNTER — Encounter (HOSPITAL_COMMUNITY): Payer: Self-pay

## 2022-06-10 DIAGNOSIS — M545 Low back pain, unspecified: Secondary | ICD-10-CM | POA: Diagnosis present

## 2022-06-10 DIAGNOSIS — M5441 Lumbago with sciatica, right side: Secondary | ICD-10-CM | POA: Diagnosis not present

## 2022-06-10 DIAGNOSIS — M5442 Lumbago with sciatica, left side: Secondary | ICD-10-CM | POA: Diagnosis not present

## 2022-06-10 DIAGNOSIS — M5431 Sciatica, right side: Secondary | ICD-10-CM

## 2022-06-10 LAB — BASIC METABOLIC PANEL
Anion gap: 10 (ref 5–15)
BUN: 13 mg/dL (ref 8–23)
CO2: 26 mmol/L (ref 22–32)
Calcium: 9.1 mg/dL (ref 8.9–10.3)
Chloride: 102 mmol/L (ref 98–111)
Creatinine, Ser: 0.88 mg/dL (ref 0.61–1.24)
GFR, Estimated: >60 mL/min (ref >60)
Glucose, Bld: 117 mg/dL — ABNORMAL HIGH (ref 70–99)
Potassium: 3.5 mmol/L (ref 3.5–5.1)
Sodium: 138 mmol/L (ref 135–145)

## 2022-06-10 LAB — URINALYSIS, ROUTINE W REFLEX MICROSCOPIC
Bilirubin Urine: NEGATIVE
Glucose, UA: NEGATIVE mg/dL
Hgb urine dipstick: NEGATIVE
Ketones, ur: NEGATIVE mg/dL
Leukocytes,Ua: NEGATIVE
Nitrite: NEGATIVE
Protein, ur: NEGATIVE mg/dL
Specific Gravity, Urine: 1.02 (ref 1.005–1.030)
pH: 5 (ref 5.0–8.0)

## 2022-06-10 LAB — CBC
HCT: 42.1 % (ref 39.0–52.0)
Hemoglobin: 13.8 g/dL (ref 13.0–17.0)
MCH: 28.8 pg (ref 26.0–34.0)
MCHC: 32.8 g/dL (ref 30.0–36.0)
MCV: 87.9 fL (ref 80.0–100.0)
Platelets: 325 10*3/uL (ref 150–400)
RBC: 4.79 MIL/uL (ref 4.22–5.81)
RDW: 13.2 % (ref 11.5–15.5)
WBC: 7.3 10*3/uL (ref 4.0–10.5)
nRBC: 0 % (ref 0.0–0.2)

## 2022-06-10 MED ORDER — DEXAMETHASONE 4 MG PO TABS: 6.0000 mg | ORAL_TABLET | Freq: Once | ORAL | Status: DC

## 2022-06-10 MED ORDER — OXYCODONE-ACETAMINOPHEN 5-325 MG PO TABS
1.0000 | ORAL_TABLET | Freq: Once | ORAL | Status: AC
  Administered 2022-06-10: 1 via ORAL
  Filled 2022-06-10: qty 1

## 2022-06-10 NOTE — ED Notes (Signed)
The patient did not answer x3

## 2022-06-10 NOTE — ED Provider Triage Note (Signed)
Emergency Medicine Provider Triage Evaluation Note  Francisco Collier , a 81 y.o. male  was evaluated in triage.  Pt complains of severe right greater than left leg and back pain.  Patient with history of lumbar spinal disease, has surgery planned with neurosurgery that he was cleared for yesterday.  He denies any new falls or injuries.  He reports that he woke up this morning with worse pain, and concern for paralysis.  He reports that his legs are entirely numb.  When asked him to clarify he he says "when I say numb I mean numb".  He is able to feel me touching him on bilateral lower extremities.  He withdraws from pain.  He denies urinary or fecal incontinence.  He reports that he may be having more frequent urination.  Review of Systems  Positive: Leg pain, back pain Negative: Fever, chills  Physical Exam  BP 126/76 (BP Location: Left Arm)   Pulse 96   Temp 99.1 F (37.3 C) (Oral)   Resp 18   Ht '6\' 2"'$  (1.88 m)   Wt 96 kg   SpO2 100%   BMI 27.17 kg/m  Gen:   Awake, no distress   Resp:  Normal effort  MSK:   Moves extremities without difficulty  Other:    Medical Decision Making  Medically screening exam initiated at 2:06 PM.  Appropriate orders placed.  Francisco Collier was informed that the remainder of the evaluation will be completed by another provider, this initial triage assessment does not replace that evaluation, and the importance of remaining in the ED until their evaluation is complete.  Workup initiated   Francisco Collier, [place] 06/10/22 1409

## 2022-06-10 NOTE — ED Provider Notes (Signed)
[facility] EMERGENCY DEPARTMENT Provider Note   CSN: [id number] Arrival date & time: 06/10/22  1337     History  Chief Complaint  Patient presents with   Back Pain   Leg Pain    Francisco Collier is a 81 y.o. male.  Patient presents ER chief complaint worsening back pain and bilateral lower extremity numbness and tingling.  Symptoms ongoing for a month now but worse in the last 2 to 3 days.  He denies any new fall or trauma.  He denies any bowel or bladder dysfunction.  He typically uses a walker and a wheelchair, lately has been having a hard time standing up.  He sees orthopedic spine specialist Dr. Rolena Infante and has reportedly been cleared for surgery but has not been able to set a date for lumbar fusion yet.  He takes Percocet at home, presents to ER due to worsening pain.       Home Medications Prior to Admission medications   Medication Sig Start Date End Date Taking? Authorizing Provider  amLODipine (NORVASC) 10 MG tablet Take 10 mg by mouth daily. 10/28/20   [provider]  Ascorbic Acid (VITAMIN C PO) Take 1 capsule by mouth daily.    [provider]  atorvastatin (LIPITOR) 10 MG tablet Take 10 mg by mouth daily.    [provider]  clopidogrel (PLAVIX) 75 MG tablet Take 1 tablet (75 mg total) by mouth daily. 02/16/22   Janine Ores, NP  Cyanocobalamin (VITAMIN B-12 PO) Take 1 capsule by mouth daily.    [provider]  diclofenac Sodium (VOLTAREN) 1 % GEL Apply 2 g topically 4 (four) times daily. Patient not taking: Reported on 04/06/2022 01/20/22   [provider]  fenofibrate 160 MG tablet Take 160 mg by mouth daily.    [provider]  fluticasone (FLONASE) 50 MCG/ACT nasal spray Place 1 spray into both nostrils 2 (two) times daily as needed for allergies or rhinitis.     [provider]  lisinopril (PRINIVIL) 10 MG tablet Take 1 tablet (10 mg total) by mouth daily. 11/13/15   Charolette Forward, MD   meclizine (ANTIVERT) 25 MG tablet Take 1 tablet (25 mg total) by mouth 3 (three) times daily as needed for dizziness. 04/06/22   Rancour, Annie Main, MD  metoprolol tartrate (LOPRESSOR) 25 MG tablet Take 0.5 tablets (12.5 mg total) by mouth 2 (two) times daily. 11/13/15   Charolette Forward, MD  Multiple Vitamins-Minerals (ZINC PO) Take 1 capsule by mouth daily.    [provider]  Omega-3 Fatty Acids (FISH OIL PO) Take 1 capsule by mouth daily.    [provider]  oxyCODONE-acetaminophen (PERCOCET) 10-325 MG tablet Take 0.5 tablets by mouth 3 (three) times daily as needed for pain. 12/23/20   [provider]  pantoprazole (PROTONIX) 20 MG tablet Take 1 tablet (20 mg total) by mouth daily. Patient taking differently: Take 20 mg by mouth daily as needed for heartburn or indigestion. 10/06/20   Isla Pence, MD  VITAMIN D PO Take 1 capsule by mouth daily.    [provider]      Allergies    Codeine, Niaspan [niacin er], and Statins    Review of Systems   Review of Systems  Constitutional:  Negative for fever.  HENT:  Negative for ear pain and sore throat.   Eyes:  Negative for pain.  Respiratory:  Negative for cough.   Cardiovascular:  Negative for chest pain.  Gastrointestinal:  Negative for abdominal pain.  Genitourinary:  Negative for flank pain.  Musculoskeletal:  Positive for back pain.  Skin:  Negative for color change and rash.  Neurological:  Negative for syncope.  All other systems reviewed and are negative.   Physical Exam Updated Vital Signs BP 137/74 (BP Location: Right Arm)   Pulse 74   Temp 98.6 F (37 C) (Oral)   Resp 18   Ht '6\' 2"'$  (1.88 m)   Wt 96 kg   SpO2 100%   BMI 27.17 kg/m  Physical Exam Constitutional:      Appearance: He is well-developed.  HENT:     Head: Normocephalic.     Nose: Nose normal.  Eyes:     Extraocular Movements: Extraocular movements intact.  Cardiovascular:     Rate and Rhythm: Normal rate.   Pulmonary:     Effort: Pulmonary effort is normal.  Musculoskeletal:     Comments: Tenderness to palpation in the lumbar 3 and 4 midline and right paraspinal region.  Skin:    Coloration: Skin is not jaundiced.  Neurological:     Mental Status: He is alert. Mental status is at baseline.     Comments: Some diminished sensation bilateral lower extremities.  Some weakness noted in the flexion of the right hip as well as dorsiflexion of the right ankle.  Otherwise compartments are soft bilateral lower extremities and dorsalis pedis pulses are 2+ bilateral lower extremities.  No saddle anesthesia.  Patient able to stand with some assistance.  Able to weight-bear and take 1 or 2 steps but has to stop secondary to pain.     ED Results / Procedures / Treatments   Labs (all labs ordered are listed, but only abnormal results are displayed) Labs Reviewed  BASIC METABOLIC PANEL - Abnormal; Notable for the following components:      Result Value   Glucose, Bld 117 (*)    All other components within normal limits  URINALYSIS, ROUTINE W REFLEX MICROSCOPIC - Abnormal; Notable for the following components:   APPearance HAZY (*)    All other components within normal limits  CBC    EKG None  Radiology MR LUMBAR SPINE WO CONTRAST  Result Date: 06/10/2022 CLINICAL DATA:  Low back pain with right greater than left radiculopathy EXAM: MRI LUMBAR SPINE WITHOUT CONTRAST TECHNIQUE: Multiplanar, multisequence MR imaging of the lumbar spine was performed. No intravenous contrast was administered. COMPARISON:  01/18/2021 FINDINGS: Segmentation:  Standard. Alignment:  Physiologic. Vertebrae: No fracture, evidence of discitis, or suspicious bone lesion. Benign intraosseous hemangioma in the L3 vertebral body is unchanged. Conus medullaris and cauda equina: Conus extends to the L1 level. Conus and cauda equina appear normal. Paraspinal and other soft tissues: Stable lipoma within the left psoas muscle. Lower  posterior paraspinal muscle atrophy. No acute findings. Disc levels: T12-L1: Minimal disc bulge. Mild bilateral facet arthropathy. No foraminal or canal stenosis. Unchanged. L1-L2: Mild diffuse disc bulge. Moderate bilateral facet arthropathy. Mild canal stenosis. No significant foraminal stenosis. Unchanged. L2-L3: Mild diffuse disc bulge. Moderate bilateral facet arthropathy with ligamentum flavum buckling. Mild canal stenosis with moderate right and mild left foraminal stenosis. Findings may be slightly progressed compared to prior. L3-L4: Prior posterior decompression. Diffuse disc bulge with moderate bilateral facet arthropathy. No canal stenosis. Severe right and moderate left foraminal stenosis. No significant interval progression. L4-L5: Prior posterior decompression. Diffuse disc bulge. Small rounded low signal intensity structure in the right lateral recess just above the L4-5 disc level (series 7, image  29), possibly a small sequestered disc fragment. Finding is more conspicuous when compared to the previous exam and could affect the right L4 nerve root. Severe right greater than left facet arthropathy. No canal stenosis. Severe right and moderate left foraminal stenosis. L5-S1: Mild endplate ridging without focal disc protrusion. Left greater than right facet arthropathy. No foraminal or canal stenosis. Unchanged. IMPRESSION: 1. Multilevel degenerative changes of the lumbar spine with mild canal stenosis at L2-L3 and L1-L2. 2. Severe right and moderate left foraminal stenosis at the L3-4 and L4-5 levels. 3. Small probable sequestered disc fragment above the L4-5 disc level which could affect the right L4 nerve root. Electronically Signed   By: Davina Poke D.O.   On: 06/10/2022 18:32    Procedures Procedures    Medications Ordered in ED Medications  oxyCODONE-acetaminophen (PERCOCET/ROXICET) 5-325 MG per tablet 1 tablet (1 tablet Oral Given 06/10/22 2132)  oxyCODONE-acetaminophen  (PERCOCET/ROXICET) 5-325 MG per tablet 1 tablet (1 tablet Oral Given 06/10/22 2122)    ED Course/ Medical Decision Making/ A&P                           Medical Decision Making Risk Prescription drug management.   History obtained by family bedside.  Review of record shows office visit with orthopedic spine specialist June 03, 2022.  Cardiac monitoring showing sinus rhythm.  Case discussed with his on-call orthopedist.  Recommending outpatient follow-up.  They will arrange for him to be seen in their office Wednesday.  Patient been taking half a Percocet at home, advised to take a full dose.  Otherwise he appears to be able to transfer from walker to wheelchair.  Discharged home in stable condition with close outpatient follow-up with his orthopedic group.        Final Clinical Impression(s) / ED Diagnoses Final diagnoses:  Bilateral sciatica    Rx / DC Orders ED Discharge Orders     None         Luna Fuse, MD 06/10/22 2213

## 2022-06-10 NOTE — ED Triage Notes (Signed)
Pt arrives POV for eval of R leg and back pain. Pt reports known history of back pain w/ pinched nerve. States he was cleared for surgery yesterday, buthas not be scheduled yet. Reports pain is so severe today that he feels he is unable to move his legs/walk. Pt is able to move both legs in triage, did arrive POV and was able to stand and transfer from car. Does not move R leg on command, but will withdraw from pain w/ moderate strength. Reports numbness to both legs, but not new numbness.

## 2022-06-10 NOTE — Discharge Instructions (Addendum)
Call your primary care doctor or specialist as discussed in the next 2-3 days.   Return immediately back to the ER if:  Your symptoms worsen within the next 12-24 hours. You develop new symptoms such as new fevers, persistent vomiting, new pain, shortness of breath, or new weakness or numbness, or if you have any other concerns.  

## 2022-06-16 ENCOUNTER — Ambulatory Visit: Payer: Medicare HMO | Admitting: Specialist

## 2022-06-21 ENCOUNTER — Encounter: Payer: Self-pay | Admitting: Vascular Surgery

## 2022-06-21 ENCOUNTER — Ambulatory Visit: Payer: Medicare HMO | Admitting: Vascular Surgery

## 2022-06-21 DIAGNOSIS — M545 Low back pain, unspecified: Secondary | ICD-10-CM

## 2022-06-21 DIAGNOSIS — G8929 Other chronic pain: Secondary | ICD-10-CM | POA: Diagnosis not present

## 2022-06-21 NOTE — Progress Notes (Signed)
Patient name: Francisco Collier MRN: [id number] DOB: 1941-02-18 Sex: male  REASON FOR CONSULT: Evaluate for abdominal exposure for L3-L5 OLIF  HPI: Francisco Collier is a 81 y.o. male, with history of coronary artery disease s/p MI, COPD, hypertension, hyperlipidemia that presents for evaluation of possible abdominal exposure for L3-L4 and L4-L5 OLIF.  He describes about 3 months ago he got severe sudden back pain and developed numbness in both legs.  He now has weakness in both lower extremities as well.  Apparently was independent and had good mobility before this event 3 months ago.  He has been evaluated by Dr. Rolena Infante and ultimately vascular surgery has been asked to evaluate for possible multilevel abdominal approach for OLIF from L3-L5.  Patient's previous abdominal surgery includes a laparotomy for what was felt to be a gastric ulcer and ultimately ended up being appendicitis and also cholecystectomy.  Past Medical History:  Diagnosis Date   Arthritis    back   CAD (coronary artery disease)    COPD (chronic obstructive pulmonary disease) (HCC)    Diverticulitis    Esophageal stricture    Gastric ulcer    GERD (gastroesophageal reflux disease)    Hyperlipemia    Hypertension    Kidney stone    MI (myocardial infarction) ([place]) 12/13/2003   Prostatic hypertrophy    S/P orchiectomy    Shortness of breath    Vertigo     Past Surgical History:  Procedure Laterality Date   ANGIOPLASTY  2005   Stent   APPENDECTOMY     BACK SURGERY     CARDIAC CATHETERIZATION N/A 11/12/2015   Procedure: Left Heart Cath and Coronary Angiography;  Surgeon: Charolette Forward, MD;  Location: [facility];  Service: Cardiovascular;  Laterality: N/A;   CHOLECYSTECTOMY     CHOLECYSTECTOMY     CORONARY ANGIOPLASTY     ESOPHAGOGASTRODUODENOSCOPY N/A 10/06/2020   Procedure: ESOPHAGOGASTRODUODENOSCOPY (EGD);  Surgeon: Jerene Bears, MD;  Location: [facility];  Service: Gastroenterology;  Laterality: N/A;    FOREIGN BODY REMOVAL  10/06/2020   Procedure: FOREIGN BODY REMOVAL;  Surgeon: Jerene Bears, MD;  Location: [facility];  Service: Gastroenterology;;   THYROID SURGERY      Family History  Problem Relation Age of Onset   Lung cancer Father    Colon cancer Neg Hx    Migraines Neg Hx     SOCIAL HISTORY: Social History   Socioeconomic History   Marital status: Widowed    Spouse name: Charleen   Number of children: 0   Years of education: 12   Highest education level: Not on file  Occupational History   Occupation: Retired  Tobacco Use   Smoking status: Former    Years: 40.00    Types: Cigarettes    Quit date: 12/12/1998    Years since quitting: 23.5   Smokeless tobacco: Never  Vaping Use   Vaping Use: Never used  Substance and Sexual Activity   Alcohol use: No   Drug use: No   Sexual activity: Not on file  Other Topics Concern   Not on file  Social History Narrative   Caffeine: none   Social Determinants of Health   Financial Resource Strain: Not on file  Food Insecurity: No Food Insecurity (02/21/2022)   Hunger Vital Sign    Worried About Running Out of Food in the Last Year: Never true    Ran Out of Food in the Last Year: Never true  Transportation  Needs: No Transportation Needs (02/21/2022)   PRAPARE - Hydrologist (Medical): No    Lack of Transportation (Non-Medical): No  Physical Activity: Not on file  Stress: Not on file  Social Connections: Not on file  Intimate Partner Violence: Not on file    Allergies  Allergen Reactions   Codeine Nausea Only    Pill form causes nausea Iv form does not   Niaspan [Niacin Er] Other (See Comments)    Severe flushing   Statins Rash    Headaches     Current Outpatient Medications  Medication Sig Dispense Refill   amLODipine (NORVASC) 10 MG tablet Take 10 mg by mouth daily.     Ascorbic Acid (VITAMIN C PO) Take 1 capsule by mouth daily.     atorvastatin (LIPITOR) 10 MG tablet Take  10 mg by mouth daily.     clopidogrel (PLAVIX) 75 MG tablet Take 1 tablet (75 mg total) by mouth daily. 30 tablet 1   Cyanocobalamin (VITAMIN B-12 PO) Take 1 capsule by mouth daily.     fenofibrate 160 MG tablet Take 160 mg by mouth daily.     fluticasone (FLONASE) 50 MCG/ACT nasal spray Place 1 spray into both nostrils 2 (two) times daily as needed for allergies or rhinitis.      lisinopril (PRINIVIL) 10 MG tablet Take 1 tablet (10 mg total) by mouth daily. 30 tablet 3   meclizine (ANTIVERT) 25 MG tablet Take 1 tablet (25 mg total) by mouth 3 (three) times daily as needed for dizziness. 30 tablet 0   metoprolol tartrate (LOPRESSOR) 25 MG tablet Take 0.5 tablets (12.5 mg total) by mouth 2 (two) times daily. 60 tablet 3   Multiple Vitamins-Minerals (ZINC PO) Take 1 capsule by mouth daily.     Omega-3 Fatty Acids (FISH OIL PO) Take 1 capsule by mouth daily.     oxyCODONE-acetaminophen (PERCOCET) 10-325 MG tablet Take 0.5 tablets by mouth 3 (three) times daily as needed for pain.     pantoprazole (PROTONIX) 20 MG tablet Take 1 tablet (20 mg total) by mouth daily. (Patient taking differently: Take 20 mg by mouth daily as needed for heartburn or indigestion.) 30 tablet 0   VITAMIN D PO Take 1 capsule by mouth daily.     diclofenac Sodium (VOLTAREN) 1 % GEL Apply 2 g topically 4 (four) times daily. (Patient not taking: Reported on 04/06/2022)     No current facility-administered medications for this visit.    REVIEW OF SYSTEMS:  '[X]'$  denotes positive finding, '[ ]'$  denotes negative finding Cardiac  Comments:  Chest pain or chest pressure:    Shortness of breath upon exertion:    Short of breath when lying flat:    Irregular heart rhythm:        Vascular    Pain in calf, thigh, or hip brought on by ambulation:    Pain in feet at night that wakes you up from your sleep:     Blood clot in your veins:    Leg swelling:         Pulmonary    Oxygen at home:    Productive cough:     Wheezing:          Neurologic    Sudden weakness in arms or legs:  x   Sudden numbness in arms or legs:  x   Sudden onset of difficulty speaking or slurred speech:    Temporary loss of vision in one eye:  Problems with dizziness:         Gastrointestinal    Blood in stool:     Vomited blood:         Genitourinary    Burning when urinating:     Blood in urine:        Psychiatric    Major depression:         Hematologic    Bleeding problems:    Problems with blood clotting too easily:        Skin    Rashes or ulcers:        Constitutional    Fever or chills:      PHYSICAL EXAM: Vitals:   06/21/22 1430  BP: (!) 144/80  Pulse: 73  Resp: 18  Temp: 98 F (36.7 C)  TempSrc: Temporal  SpO2: 94%  Weight: 215 lb (97.5 kg)  Height: '6\' 1"'$  (1.854 m)    GENERAL: The patient is a well-nourished male, in no acute distress. The vital signs are documented above. CARDIAC: There is a regular rate and rhythm.  VASCULAR:  Hard to appreciate femoral pulses with him sitting in a wheelchair (unable to get on table) Bilateral PT pulses palpable PULMONARY: No respiratory distress. ABDOMEN: Soft and non-tender.  Midline abdominal incision MUSCULOSKELETAL: There are no major deformities or cyanosis. SKIN: There are no ulcers or rashes noted. PSYCHIATRIC: The patient has a normal affect.  DATA:   MRI reviewed     Assessment/Plan:  81 y.o. male, with history of coronary artery disease, COPD, hypertension, hyperlipidemia that presents for evaluation of possible abdominal exposure for L3-L4 and L4-L5 OLIF.  I discussed him being in the right lateral position and then making a oblique incision over his left abdominal wall around the L3-L4 and L4-L5 disc space and then entering the retroperitoneum to mobilize the peritoneum and left ureter away from the disc space.  Discussed mobilizing the left psoas to get to the disc space exposed.  Discussed possibly moving the iliac artery and vein and risk of  injury to the above structures.  All questions answered.  I will let Dr. Rolena Infante know he is a reasonable candidate from my standpoint.  He also has an appointment with neurology tomorrow as well to further evaluate his symptoms.   Marty Heck, MD [facility] Office: [phone]

## 2022-06-22 ENCOUNTER — Ambulatory Visit: Payer: Self-pay | Admitting: Orthopedic Surgery

## 2022-07-01 ENCOUNTER — Other Ambulatory Visit: Payer: Self-pay

## 2022-07-18 ENCOUNTER — Other Ambulatory Visit: Payer: Self-pay

## 2022-07-18 ENCOUNTER — Encounter (HOSPITAL_COMMUNITY)
Admission: RE | Admit: 2022-07-18 | Discharge: 2022-07-18 | Disposition: A | Discharge status: Home / Self Care | Payer: Medicare HMO | Source: Ambulatory Visit | Attending: Orthopedic Surgery | Admitting: Orthopedic Surgery

## 2022-07-18 ENCOUNTER — Encounter (HOSPITAL_COMMUNITY): Payer: Self-pay

## 2022-07-18 VITALS — BP 120/71 | HR 94 | Temp 98.5°F | Resp 17 | Ht 73.0 in | Wt 215.0 lb

## 2022-07-18 DIAGNOSIS — Z01818 Encounter for other preprocedural examination: Secondary | ICD-10-CM | POA: Diagnosis present

## 2022-07-18 DIAGNOSIS — Z01812 Encounter for preprocedural laboratory examination: Secondary | ICD-10-CM | POA: Diagnosis not present

## 2022-07-18 LAB — TYPE AND SCREEN
ABO/RH(D): A POS
Antibody Screen: NEGATIVE

## 2022-07-18 LAB — BASIC METABOLIC PANEL
Anion gap: 7 (ref 5–15)
BUN: 11 mg/dL (ref 8–23)
CO2: 28 mmol/L (ref 22–32)
Calcium: 9.4 mg/dL (ref 8.9–10.3)
Chloride: 104 mmol/L (ref 98–111)
Creatinine, Ser: 0.95 mg/dL (ref 0.61–1.24)
GFR, Estimated: >60 mL/min (ref >60)
Glucose, Bld: 117 mg/dL — ABNORMAL HIGH (ref 70–99)
Potassium: 3.8 mmol/L (ref 3.5–5.1)
Sodium: 139 mmol/L (ref 135–145)

## 2022-07-18 LAB — CBC
HCT: 41.6 % (ref 39.0–52.0)
Hemoglobin: 14.1 g/dL (ref 13.0–17.0)
MCH: 29.7 pg (ref 26.0–34.0)
MCHC: 33.9 g/dL (ref 30.0–36.0)
MCV: 87.8 fL (ref 80.0–100.0)
Platelets: 334 10*3/uL (ref 150–400)
RBC: 4.74 MIL/uL (ref 4.22–5.81)
RDW: 13.1 % (ref 11.5–15.5)
WBC: 7.8 10*3/uL (ref 4.0–10.5)
nRBC: 0 % (ref 0.0–0.2)

## 2022-07-18 LAB — SURGICAL PCR SCREEN
MRSA, PCR: NEGATIVE
Staphylococcus aureus: NEGATIVE

## 2022-07-18 NOTE — Progress Notes (Addendum)
PCP - Ferd Hibbs Cardiologist - Dr. Terrence Dupont  PPM/ICD - denies Device Orders -  Rep Notified -   Chest x-ray - 04/06/22 EKG - 04/08/22 Stress Test - none ECHO - 02/13/22 Cardiac Cath - 11/12/15  Sleep Study - ordered but never performed according to MD progress note 01/18/21 CPAP - no  Fasting Blood Sugar - na Checks Blood Sugar _____ times a day  Blood Thinner Instructions: per pt last dose of Plavix was 07/17/22. Aspirin Instructions:na  ERAS Protcol -no PRE-SURGERY Ensure or G2-   COVID TEST- na   Anesthesia review: yes-cardiac history  Patient denies shortness of breath, fever, cough and chest pain at PAT appointment   All instructions explained to the patient, with a verbal understanding of the material. Patient agrees to go over the instructions while at home for a better understanding. Patient also instructed to wear a mask when out in public prior to surgery. The opportunity to ask questions was provided.

## 2022-07-18 NOTE — Progress Notes (Signed)
Surgical Instructions    Your procedure is scheduled on Monday August 14.  Report to [facility] Main Entrance "A" at 5:30 A.M., then check in with the Admitting office.  Call this number if you have problems the morning of surgery:  [phone]   If you have any questions prior to your surgery date call [phone]: Open Monday-Friday 8am-4pm    Remember:  Do not eat or drink anything after midnight the night before your surgery    Take these medicines the morning of surgery with A SIP OF WATER:  atorvastatin (LIPITOR)  fenofibrate  metoprolol tartrate (LOPRESSOR)  If needed you may take - acetaminophen (TYLENOL) BREZTRI AEROSPHERE  fluticasone (FLONASE) meclizine (ANTIVERT) ondansetron (ZOFRAN) oxyCODONE-acetaminophen (PERCOCET)  pantoprazole (PROTONIX)  Follow your surgeon's instructions for stopping Plavix. Last dose should be 07/17/22.    As of today, STOP taking any Aspirin (unless otherwise instructed by your surgeon) Aleve, Naproxen, Ibuprofen, Motrin, Advil, Goody's, BC's, all herbal medications, fish oil, and all vitamins.           Do not wear jewelry or makeup. Do not wear lotions, powders, perfumes/cologne or deodorant. Do not shave 48 hours prior to surgery.  Men may shave face and neck. Do not bring valuables to the hospital. Do not wear nail polish, gel polish, artificial nails, or any other type of covering on natural nails (fingers and toes) If you have artificial nails or gel coating that need to be removed by a nail salon, please have this removed prior to surgery. Artificial nails or gel coating may interfere with anesthesia's ability to adequately monitor your vital signs.  [facility] is not responsible for any belongings or valuables.    Do NOT Smoke (Tobacco/Vaping)  24 hours prior to your procedure  If you use a CPAP at night, you may bring your mask for your overnight stay.   Contacts, glasses, hearing aids, dentures or partials may not be worn  into surgery, please bring cases for these belongings   For patients admitted to the hospital, discharge time will be determined by your treatment team.   Patients discharged the day of surgery will not be allowed to drive home, and someone needs to stay with them for 24 hours.   SURGICAL WAITING ROOM VISITATION Patients having surgery or a procedure may have no more than 2 support people in the waiting area - these visitors may rotate.   Children under the age of 25 must have an adult with them who is not the patient. If the patient needs to stay at the hospital during part of their recovery, the visitor guidelines for inpatient rooms apply. Pre-op nurse will coordinate an appropriate time for 1 support person to accompany patient in pre-op.  This support person may not rotate.   Please refer to the [facility] website for the visitor guidelines for Inpatients (after your surgery is over and you are in a regular room).    Special instructions:    Oral Hygiene is also important to reduce your risk of infection.  Remember - BRUSH YOUR TEETH THE MORNING OF SURGERY WITH YOUR REGULAR TOOTHPASTE   [facility]- Preparing For Surgery  Before surgery, you can play an important role. Because skin is not sterile, your skin needs to be as free of germs as possible. You can reduce the number of germs on your skin by washing with CHG (chlorahexidine gluconate) Soap before surgery.  CHG is an antiseptic cleaner which kills germs and bonds with the skin  to continue killing germs even after washing.     Please do not use if you have an allergy to CHG or antibacterial soaps. If your skin becomes reddened/irritated stop using the CHG.  Do not shave (including legs and underarms) for at least 48 hours prior to first CHG shower. It is OK to shave your face.  Please follow these instructions carefully.     Shower the NIGHT BEFORE SURGERY and the MORNING OF SURGERY with CHG Soap.   If you chose to wash  your hair, wash your hair first as usual with your normal shampoo. After you shampoo, rinse your hair and body thoroughly to remove the shampoo.  Then [employer] and genitals (private parts) with your normal soap and rinse thoroughly to remove soap.  After that Use CHG Soap as you would any other liquid soap. You can apply CHG directly to the skin and wash gently with a scrungie or a clean washcloth.   Apply the CHG Soap to your body ONLY FROM THE NECK DOWN.  Do not use on open wounds or open sores. Avoid contact with your eyes, ears, mouth and genitals (private parts). Wash Face and genitals (private parts)  with your normal soap.   Wash thoroughly, paying special attention to the area where your surgery will be performed.  Thoroughly rinse your body with warm water from the neck down.  DO NOT shower/wash with your normal soap after using and rinsing off the CHG Soap.  Pat yourself dry with a CLEAN TOWEL.  Wear CLEAN PAJAMAS to bed the night before surgery  Place CLEAN SHEETS on your bed the night before your surgery  DO NOT SLEEP WITH PETS.   Day of Surgery:  Take a shower with CHG soap. Wear Clean/Comfortable clothing the morning of surgery Do not apply any deodorants/lotions.   Remember to brush your teeth WITH YOUR REGULAR TOOTHPASTE.    If you received a COVID test during your pre-op visit, it is requested that you wear a mask when out in public, stay away from anyone that may not be feeling well, and notify your surgeon if you develop symptoms. If you have been in contact with anyone that has tested positive in the last 10 days, please notify your surgeon.    Please read over the following fact sheets that you were given.

## 2022-07-19 NOTE — Progress Notes (Signed)
Anesthesia Chart Review:  Follows with cardiologist Dr. Terrence Dupont for history of CAD s/p DES x 2 to RCA 11/2015.  Low risk nuclear stress 01/27/2017.  Echo 02/13/2022 showed EF 60 to 65%, grade 1 DD, no significant valvular abnormalities.  Dr. Terrence Dupont cleared patient on 06/07/2022, copy of clearance on chart.  History of multiple TIA/CVA. He was admitted in 12/2020 for headache and right-sided weakness status post tPA and symptom improved significantly after tPA and close to baseline.  CT/MRI no acute finding.  CT head and neck no LVO.  EF 55 to 60%.  LDL 55, A1c 4.7.  Discharged on DAPT for 3 weeks, Lipitor and fenofibrate. In 01/2021 he was again admitted for right-sided weakness and confusion, MRI whole spine showed spinal stenosis but no spinal cord compression.  Again admitted March 2023 for stroke symptoms and was given TNK and admitted to ICU.  MRI showed no acute abnormality.  Differential diagnosis included TIA, anxiety, conversion disorder.  Recommended continue Plavix and follow-up outpatient with neurology in stroke clinic.  Patient subsequently seen in the ED on 04/06/2022 for dizziness and presyncope.  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  MRI showed no acute findings.  Neurology was consulted and did not feel patient needed additional stroke workup.  He was prescribed meclizine and advised to follow-up with neurology.  Pt followed up with neurology Frann Rider, NP on 07/21/22 and discussed upcoming surgery. Per note, "Lumbar radiculopathy: Scheduled to undergo OLIF 8/14 with Dr. Rolena Infante.  He is cleared to proceed with surgery from a stroke standpoint as it has been 5 months since his prior possible TIA and he is significantly limited due to significant pain and weakness.  Plavix currently on hold for procedure and recommend restarting immediately after or once cleared by surgeon."  Concern for OSA, however patient has not undergone sleep study.  Preop labs reviewed, WNL.  EKG  04/06/2022: Sinus rhythm.  Rate 83. Minimal ST depression, anterolateral leads. Minimal ST elevation, inferior leads  CTA head/neck 04/06/22: IMPRESSION: 1. No acute intracranial pathology. Or emergent large vessel occlusion. 2. Mild intracranial atherosclerotic disease with up to moderate stenosis of the left supraclinoid ICA. No other hemodynamically significant stenosis. 3. Unchanged moderate stenosis of the distal left V2 segment at the level of the C3 transverse foramen due to adjacent osteophytes. Otherwise, patent vasculature of the neck with no other hemodynamically significant stenosis. 4. 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 5.  Emphysema (ICD10-J43.9).   MRI Brain 04/06/22: IMPRESSION: No acute intracranial process. No evidence of acute or subacute infarct.  TTE 02/13/2022:  1. Left ventricular ejection fraction, by estimation, is 60 to 65%. The  left ventricle has normal function. The left ventricle demonstrates  regional wall motion abnormalities (see scoring diagram/findings for  description). Left ventricular diastolic  parameters are consistent with Grade I diastolic dysfunction (impaired  relaxation).   2. Right ventricular systolic function is normal. The right ventricular  size is normal.   3. A small pericardial effusion is present. The pericardial effusion is  anterior to the right ventricle. There is no evidence of cardiac  tamponade.   4. The mitral valve is normal in structure. No evidence of mitral valve  regurgitation. No evidence of mitral stenosis.   5. The aortic valve is tricuspid. There is mild calcification of the  aortic valve. Aortic valve regurgitation is not visualized. Aortic valve  sclerosis is present, with no evidence of aortic valve stenosis.  6. Aortic dilatation noted. There is mild dilatation of the ascending  aorta, measuring 40 mm.   7. The inferior vena cava is  normal in size with greater than 50%  respiratory variability, suggesting right atrial pressure of 3 mmHg.   Conclusion(s)/Recommendation(s): No intracardiac source of embolism  detected on this transthoracic study. Consider a transesophageal  echocardiogram to exclude cardiac source of embolism if clinically  indicated.   Nuclear stress 01/27/2017: IMPRESSION: 1. There fixed defect in the inferior wall and anteroseptal walls. No stress-induced perfusion defect. No LV dilatation.   2. Normal left ventricular wall motion.   3. Left ventricular ejection fraction 59%   4. Non invasive risk stratification*: Low risk.  Cath and PCI 11/12/2015: Mid RCA lesion, 85% stenosed. Prox LAD lesion, 50% stenosed. Mid LAD to Dist LAD lesion, 30% stenosed. Prox Cx to Mid Cx lesion, 20% stenosed. Mid Cx to Dist Cx lesion, 20% stenosed. The left ventricular systolic function is normal. Prox RCA to Mid RCA lesion, 85% stenosed. Post intervention, there is a 0% residual stenosis. Prox RCA lesion, 80% stenosed. Post intervention, there is a 0% residual stenosis. The lesion was not previously treated.   [facility]/Anesthesiology Phone [phone] 07/21/2022 9:40 AM

## 2022-07-20 NOTE — Progress Notes (Unsigned)
[facility] [address] [place]. [place] [address] [phone]       HOSPITAL FOLLOW UP NOTE  [name] Date of Birth:  Dec 04, 1941 Medical Record Number:  [id number]   Reason for Referral:  hospital stroke follow up    SUBJECTIVE:   CHIEF COMPLAINT:  No chief complaint on file.   HPI:   [name] is a 81 y.o. male with history of TIA, CAD, COPD, GERD, MI, HTN, HLD, gastric ulcer and esophageal stricture who presented on 02/13/2022 with heaviness of the right leg, right arm weakness and right facial numbness.  Personally reviewed hospitalization pertinent progress notes, lab work and imaging.  CTH unremarkable, he received TNK. MR brain no acute infarct or hemorrhage.  CTA head/neck moderate narrowing of left V2, moderate stenosis in distal paraclinoid left ICA.  EF 60 to 65%.  LDL 51.  A1c 4.6.  Evaluated by Dr. Erlinda Hong who felt symptoms in setting of possibly TIA but DDx including anxiety and conversion disorder.  He was previously seen 12/2020 for headache and right-sided weakness and received tPA with improvement of symptoms and no evidence of acute stroke as well as 01/2021 again for right-sided weakness and confusion with workup negative for acute stroke and spinal imaging showed stenosis but no spinal cord compression.  Recommended Plavix 75 mg daily as well as continuation of atorvastatin and fenofibrate.         PERTINENT IMAGING  Per hospitalization 02/13/2022 Code Stroke CT head No acute abnormality. Small vessel disease. Atrophy.  CTA head & neck moderate narrowing of left V2, moderate stenosis in distal paraclinoid left ICA MRI  no recent infarction or hemorrhage, chronic microvascular ischemic changes 2D Echo EF 14-48%, grade 1 diastolic dysfunction, small pericardial effusion, no atrial level shunt LDL 51 HgbA1c 4.6 VTE prophylaxis     ROS:   14 system review of systems performed and negative with exception of ***  PMH:   Past Medical History:  Diagnosis Date   Arthritis    back   CAD (coronary artery disease)    COPD (chronic obstructive pulmonary disease) (HCC)    Diverticulitis    Esophageal stricture    Gastric ulcer    GERD (gastroesophageal reflux disease)    Hyperlipemia    Hypertension    Kidney stone    MI (myocardial infarction) ([facility]) 12/13/2003   Prostatic hypertrophy    S/P orchiectomy    Shortness of breath    Vertigo     PSH:  Past Surgical History:  Procedure Laterality Date   ANGIOPLASTY  2005   Stent   APPENDECTOMY     BACK SURGERY     CARDIAC CATHETERIZATION N/A 11/12/2015   Procedure: Left Heart Cath and Coronary Angiography;  Surgeon: Charolette Forward, MD;  Location: [facility];  Service: Cardiovascular;  Laterality: N/A;   CHOLECYSTECTOMY     CHOLECYSTECTOMY     CORONARY ANGIOPLASTY     ESOPHAGOGASTRODUODENOSCOPY N/A 10/06/2020   Procedure: ESOPHAGOGASTRODUODENOSCOPY (EGD);  Surgeon: Jerene Bears, MD;  Location: [facility];  Service: Gastroenterology;  Laterality: N/A;   FOREIGN BODY REMOVAL  10/06/2020   Procedure: FOREIGN BODY REMOVAL;  Surgeon: Jerene Bears, MD;  Location: [facility];  Service: Gastroenterology;;   THYROID SURGERY      Social History:  Social History   Socioeconomic History   Marital status: Widowed    Spouse name: Charleen   Number of children: 0   Years of education: 65  Highest education level: Not on file  Occupational History   Occupation: Retired  Tobacco Use   Smoking status: Former    Years: 40.00    Types: Cigarettes    Quit date: 12/12/1998    Years since quitting: 23.6   Smokeless tobacco: Never  Vaping Use   Vaping Use: Never used  Substance and Sexual Activity   Alcohol use: No   Drug use: No   Sexual activity: Not on file  Other Topics Concern   Not on file  Social History Narrative   Caffeine: none   Social Determinants of Health   Financial Resource Strain: Not on file  Food Insecurity: No Food  Insecurity (02/21/2022)   Hunger Vital Sign    Worried About Running Out of Food in the Last Year: Never true    Ran Out of Food in the Last Year: Never true  Transportation Needs: No Transportation Needs (02/21/2022)   PRAPARE - Hydrologist (Medical): No    Lack of Transportation (Non-Medical): No  Physical Activity: Not on file  Stress: Not on file  Social Connections: Not on file  Intimate Partner Violence: Not on file    Family History:  Family History  Problem Relation Age of Onset   Lung cancer Father    Colon cancer Neg Hx    Migraines Neg Hx     Medications:   Current Outpatient Medications on File Prior to Visit  Medication Sig Dispense Refill   acetaminophen (TYLENOL) 500 MG tablet Take 500 mg by mouth daily as needed.     amLODipine (NORVASC) 5 MG tablet  (Patient not taking: Reported on 07/18/2022)     Ascorbic Acid (VITAMIN C PO) Take 1 capsule by mouth daily.     atorvastatin (LIPITOR) 10 MG tablet Take 10 mg by mouth daily.     BREZTRI AEROSPHERE 160-9-4.8 MCG/ACT AERO Inhale 2 puffs into the lungs daily as needed (wheezing, shortness of breath).     Cholecalciferol (VITAMIN D-3 PO) Take 1 capsule by mouth daily.     clopidogrel (PLAVIX) 75 MG tablet Take 1 tablet (75 mg total) by mouth daily. 30 tablet 1   Cyanocobalamin (VITAMIN B-12 PO) Take 1 capsule by mouth daily.     fenofibrate 160 MG tablet Take 160 mg by mouth daily.     fluticasone (FLONASE) 50 MCG/ACT nasal spray Place 1 spray into both nostrils 2 (two) times daily as needed for allergies.     lisinopril (PRINIVIL) 10 MG tablet Take 1 tablet (10 mg total) by mouth daily. 30 tablet 3   meclizine (ANTIVERT) 25 MG tablet Take 1 tablet (25 mg total) by mouth 3 (three) times daily as needed for dizziness. 30 tablet 0   metoprolol tartrate (LOPRESSOR) 25 MG tablet Take 0.5 tablets (12.5 mg total) by mouth 2 (two) times daily. (Patient taking differently: Take 6.25 mg by mouth 2 (two)  times daily.) 60 tablet 3   Multiple Vitamins-Minerals (ZINC PO) Take 1 capsule by mouth daily.     Omega-3 Fatty Acids (FISH OIL PO) Take 1 capsule by mouth daily.     ondansetron (ZOFRAN) 8 MG tablet Take 8 mg by mouth 3 (three) times daily as needed for vomiting or nausea.     oxyCODONE-acetaminophen (PERCOCET) 10-325 MG tablet Take 0.5 tablets by mouth 3 (three) times daily as needed for pain.     pantoprazole (PROTONIX) 20 MG tablet Take 1 tablet (20 mg total) by mouth daily. (Patient  not taking: Reported on 07/18/2022) 30 tablet 0   pantoprazole (PROTONIX) 40 MG tablet Take 40 mg by mouth daily as needed (acid reflux).     No current facility-administered medications on file prior to visit.    Allergies:   Allergies  Allergen Reactions   Codeine Nausea Only    Pill form causes nausea Iv form does not   Niaspan [Niacin Er] Other (See Comments)    Severe flushing   Neurontin [Gabapentin] Other (See Comments)    Dizziness Double vision   Statins Rash    Headaches       OBJECTIVE:  Physical Exam  There were no vitals filed for this visit. There is no height or weight on file to calculate BMI. No results found.     02/21/2022    9:24 AM  Depression screen PHQ 2/9  Decreased Interest 0  Down, Depressed, Hopeless 1  PHQ - 2 Score 1     General: well developed, well nourished, seated, in no evident distress Head: head normocephalic and atraumatic.   Neck: supple with no carotid or supraclavicular bruits Cardiovascular: regular rate and rhythm, no murmurs Musculoskeletal: no deformity Skin:  no rash/petichiae Vascular:  Normal pulses all extremities   Neurologic Exam Mental Status: Awake and fully alert. Oriented to place and time. Recent and remote memory intact. Attention span, concentration and fund of knowledge appropriate. Mood and affect appropriate.  Cranial Nerves: Fundoscopic exam reveals sharp disc margins. Pupils equal, briskly reactive to light.  Extraocular movements full without nystagmus. Visual fields full to confrontation. Hearing intact. Facial sensation intact. Face, tongue, palate moves normally and symmetrically.  Motor: Normal bulk and tone. Normal strength in all tested extremity muscles Sensory.: intact to touch , pinprick , position and vibratory sensation.  Coordination: Rapid alternating movements normal in all extremities. Finger-to-nose and heel-to-shin performed accurately bilaterally. Gait and Station: Arises from chair without difficulty. Stance is normal. Gait demonstrates normal stride length and balance with ***. Tandem walk and heel toe ***.  Reflexes: 1+ and symmetric. Toes downgoing.     NIHSS  *** Modified Rankin  ***      ASSESSMENT: Francisco Collier is a 81 y.o. year old male with recent episode of strokelike symptoms s/p TNK on 02/13/2022 likely TIA although unable to rule anxiety vs conversion disorder. Vascular risk factors include hx of TIA 12/2020, HTN, HLD, CAD, MI and advanced age.      PLAN:  TIA:  Residual deficit: ***.  Continue clopidogrel 75 mg daily  and atorvastatin and fenofibrate for secondary stroke prevention.   Discussed secondary stroke prevention measures and importance of close PCP follow up for aggressive stroke risk factor management including BP goal<130/90, HLD with LDL goal<70 and DM with A1c.<7 .  Stroke labs 02/2022: LDL 51, A1c 4.6 I have gone over the pathophysiology of stroke, warning signs and symptoms, risk factors and their management in some detail with instructions to go to the closest emergency room for symptoms of concern.     Follow up in *** or call earlier if needed   CC:  [facility] provider: Dr. Leonie Collier PCP: Francisco Hibbs, NP    I spent *** minutes of face-to-face and non-face-to-face time with patient.  This included previsit chart review including review of recent hospitalization, lab review, study review, order entry, electronic health record  documentation, patient education regarding recent stroke including etiology, secondary stroke prevention measures and importance of managing stroke risk factors, residual deficits and typical recovery time and  answered all other questions to patient satisfaction   Frann Rider, [facility]  [facility] [address] [place], [place] [address]  Phone [phone] Fax [phone] Note: This document was prepared with digital dictation and possible smart phrase technology. Any transcriptional errors that result from this process are unintentional.

## 2022-07-21 ENCOUNTER — Ambulatory Visit: Payer: Medicare HMO | Admitting: Adult Health

## 2022-07-21 ENCOUNTER — Encounter: Payer: Self-pay | Admitting: Adult Health

## 2022-07-21 VITALS — BP 128/73 | HR 81 | Ht 73.0 in

## 2022-07-21 DIAGNOSIS — G459 Transient cerebral ischemic attack, unspecified: Secondary | ICD-10-CM

## 2022-07-21 DIAGNOSIS — Z09 Encounter for follow-up examination after completed treatment for conditions other than malignant neoplasm: Secondary | ICD-10-CM | POA: Diagnosis not present

## 2022-07-21 DIAGNOSIS — M5416 Radiculopathy, lumbar region: Secondary | ICD-10-CM

## 2022-07-21 NOTE — Patient Instructions (Addendum)
You are cleared to pursue surgical procedure from our standpoint   Restart plavix once advised by your surgeon   Continue atorvastatin and fenofibrate for cholesterol management  Continue to follow up with PCP regarding cholesterol and blood pressure management  Maintain strict control of hypertension with blood pressure goal below 130/90 and cholesterol with LDL cholesterol (bad cholesterol) goal below 70 mg/dL.   Signs of a Stroke? Follow the BEFAST method:  Balance Watch for a sudden loss of balance, trouble with coordination or vertigo Eyes Is there a sudden loss of vision in one or both eyes? Or double vision?  Face: Ask the person to smile. Does one side of the face droop or is it numb?  Arms: Ask the person to raise both arms. Does one arm drift downward? Is there weakness or numbness of a leg? Speech: Ask the person to repeat a simple phrase. Does the speech sound slurred/strange? Is the person confused ? Time: If you observe any of these signs, call [phone].       Thank you for coming to see [place] at [facility]. I hope we have been able to provide you high quality care today.  You may receive a patient satisfaction survey over the next few weeks. We would appreciate your feedback and comments so that we may continue to improve ourselves and the health of our patients.    Transient Ischemic Attack A transient ischemic attack (TIA) causes the same symptoms as a stroke, but the symptoms go away quickly. A TIA happens when blood flow to the brain is blocked. Having a TIA means you may be at risk for a stroke. A TIA is a medical emergency. What are the causes? A TIA is caused by a blocked artery in the head or neck. This means the brain does not get the blood supply it needs. A blockage can be caused by: Fatty buildup in an artery in the head or neck. A blood clot. A tear in an artery. Irritation and swelling (inflammation) of an artery. Sometimes the cause is not  known. What increases the risk? Certain things may make you more likely to have a TIA. Some of these are things that you can change, such as: Being very overweight. Using products that have nicotine or tobacco. Taking birth control pills. Not being active. Drinking too much alcohol. Using drugs. Health conditions that may increase your risk include: High blood pressure. High cholesterol. Diabetes. Heart disease. A heartbeat that is not regular (atrial fibrillation). Sickle cell disease. Sleep problems (sleep apnea). Long-term diseases that cause irritation and swelling. Problems with blood clotting. Other risk factors include: Being over the age of 58. Being male. Having a family history of stroke. Having had blood clots, stroke, TIA, or heart attack in the past. Having a history of high blood pressure when pregnant (preeclampsia). Very bad headaches (migraines). What are the signs or symptoms? The symptoms of a TIA are like those of a stroke. They can include: Weakness or loss of feeling in your face, arm, or leg. This often happens on one side of your body. Trouble walking. Trouble moving your arms or legs. Trouble talking or understanding what people are saying. Problems with how you see. Feeling dizzy. Feeling confused. Loss of balance or coordination. Feeling like you may vomit (nausea) or you vomit. Having a very bad headache. If you can, note what time you started to have symptoms. Tell your doctor. How is this treated? The goal of treatment is to  lower the risk for a stroke. This may include: Changes to diet and lifestyle, such as getting regular exercise and stopping smoking. Taking medicines to: Thin the blood. Lower blood pressure. Lower cholesterol. Treating other health conditions, such as diabetes. If testing shows that an artery in your brain is narrow, your doctor may recommend a procedure to: Take the blockage out of your artery (carotid  endarterectomy). Open or widen an artery in your neck (carotid angioplasty and stenting). Follow these instructions at home: Medicines Take over-the-counter and prescription medicines only as told by your doctor. If you were told to take aspirin or another medicine to thin your blood, take it exactly as told by your doctor. Taking too much of the medicine can cause bleeding. Taking too little of the medicine may not work to treat the problem. Eating and drinking  Eat 5 or more servings of fruits and vegetables each day. Follow instructions from your doctor about your diet. You may need to follow a certain diet to help lower your risk of a stroke. You may need to: Eat a diet that is low in fat and salt. Eat foods with a lot of fiber. Limit carbohydrates and sugar. If you drink alcohol: Limit how much you have to: 0-1 drink a day for women who are not pregnant. 0-2 drinks a day for men. Know how much alcohol is in a drink. In the [place] one drink equals one 12 oz bottle of beer (356m), one 5 oz glass of wine (1468m, or one 1 oz glass of hard liquor (4475m General instructions Keep a healthy weight. Try to get at least 30 minutes of exercise on most days. Get treatment if you have sleep problems. Do not smoke or use any products that contain nicotine or tobacco. If you need help quitting, ask your doctor. Do not use drugs. Keep all follow-up visits. Where to find more information American Stroke Association: www.stroke.org Get help right away if: You have chest pain. You have a heartbeat that is not regular. You have any signs of a stroke. "BE FAST" is an easy way to remember the main warning signs: B - Balance. Dizziness, sudden trouble walking, or loss of balance. E - Eyes. Trouble seeing or a change in how you see. F - Face. Sudden weakness or loss of feeling of the face. The face or eyelid may droop on one side. A - Arms. Weakness or loss of feeling in an arm. This happens all  of a sudden and most often on one side of the body. S - Speech. Sudden trouble speaking, slurred speech, or trouble understanding what people say. T - Time. Time to call emergency services. Write down what time symptoms started. You have other signs of a stroke, such as: A sudden, very bad headache with no known cause. Feeling like you may vomit. Vomiting. A seizure. These symptoms may be an emergency. Get help right away. Call your local emergency services (911 in the [place] Do not wait to see if the symptoms will go away. Do not drive yourself to the hospital. Summary A transient ischemic attack (TIA) happens when an artery in the head or neck is blocked. This causes the same symptoms as a stroke. The symptoms go away quickly. A TIA is a medical emergency. Get help right away, even if your symptoms go away. Having a TIA means that you may be at risk for a stroke. Taking medicines and making diet and lifestyle changes can help to prevent a  stroke. This information is not intended to replace advice given to you by your health care provider. Make sure you discuss any questions you have with your health care provider. Document Revised: 03/30/2022 Document Reviewed: 06/23/2020 Elsevier Patient Education  [employer].

## 2022-07-21 NOTE — Anesthesia Preprocedure Evaluation (Addendum)
Anesthesia Evaluation  Patient identified by MRN, date of birth, ID band Patient awake    Reviewed: Allergy & Precautions, NPO status , Patient's Chart, lab work & pertinent test results  History of Anesthesia Complications Negative for: history of anesthetic complications  Airway Mallampati: I  TM Distance: >3 FB Neck ROM: Full    Dental  (+) Dental Advisory Given, Edentulous Upper, Edentulous Lower   Pulmonary COPD, former smoker,    Pulmonary exam normal        Cardiovascular hypertension, + CAD, + Past MI and + Cardiac Stents (2016)  Normal cardiovascular exam   Echo 02/13/22: EF 60-65%, g1dd, normal RVSF, small pericardial effusion w/o evidence of tamponade, mild AV sclerosis w/o stenosis, ascending aorta 40 mm  NST 2018: fixed defect in the inferior and anteroseptal walls, no stress-induced perfusion defect, EF 59%, low risk   Neuro/Psych Multiple TIA/stroke like symptom episodes with no abnormalities found on imaging. TIA vs anxiety vs conversion disorder. TIACVA    GI/Hepatic Neg liver ROS, PUD, GERD  ,Esophageal stricture   Endo/Other  negative endocrine ROS  Renal/GU negative Renal ROS  negative genitourinary   Musculoskeletal  (+) Arthritis ,   Abdominal   Peds  Hematology negative hematology ROS (+)   Anesthesia Other Findings   Reproductive/Obstetrics                          Anesthesia Physical Anesthesia Plan  ASA: 3  Anesthesia Plan: General   Post-op Pain Management: Regional block*, Ofirmev IV (intra-op)*, Toradol IV (intra-op)*, Ketamine IV*, Dilaudid IV and Precedex   Induction: Intravenous  PONV Risk Score and Plan: 3 and Ondansetron, Dexamethasone, Treatment may vary due to age or medical condition and Midazolam  Airway Management Planned: Oral ETT  Additional Equipment: Arterial line  Intra-op Plan:   Post-operative Plan: Extubation in OR  Informed  Consent: I have reviewed the patients History and Physical, chart, labs and discussed the procedure including the risks, benefits and alternatives for the proposed anesthesia with the patient or authorized representative who has indicated his/her understanding and acceptance.     Dental advisory given  Plan Discussed with:   Anesthesia Plan Comments: (PAT note by Karoline Caldwell, PA-C:  Follows with cardiologist Dr. Terrence Dupont for history of CAD s/p DES x 2 to RCA 11/2015.  Low risk nuclear stress 01/27/2017.  Echo 02/13/2022 showed EF 60 to 65%, grade 1 DD, no significant valvular abnormalities.  Dr. Terrence Dupont cleared patient on 06/07/2022, copy of clearance on chart.  History of multiple TIA/CVA. He was admitted in 12/2020 for headache and right-sided weakness status post tPA and symptom improved significantly after tPA and close to baseline. CT/MRI no acute finding. CT head and neck no LVO. EF 55 to 60%. LDL 55, A1c 4.7. Discharged on DAPT for 3 weeks, Lipitor and fenofibrate. In 01/2021 he was again admitted for right-sided weakness and confusion, MRIwholespine showed spinal stenosis but no spinal cord compression.  Again admitted March 2023 for stroke symptoms and was given TNK and admitted to ICU.  MRI showed no acute abnormality.  Differential diagnosis included TIA, anxiety, conversion disorder.  Recommended continue Plavix and follow-up outpatient with neurology in stroke clinic.  Patient subsequently seen in the ED on 04/06/2022 for dizziness and presyncope.  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  MRI showed no acute findings.  Neurology was consulted and did not feel patient needed additional stroke workup.  He was prescribed meclizine and advised  to follow-up with neurology.  Pt followed up with neurology Frann Rider, NP on 07/21/22 and discussed upcoming surgery. Per note, "Lumbar radiculopathy:Scheduled to undergo OLIF 8/14 with Dr. Rolena Infante. He is cleared to proceed with surgery from  a stroke standpoint as it has been 5 months since his prior possible TIA and he is significantly limited due to significant pain and weakness. Plavix currently on hold for procedure and recommend restarting immediately after or once cleared by surgeon."  Concern for OSA, however patient has not undergone sleep study.  Preop labs reviewed, WNL.  EKG 04/06/2022: Sinus rhythm.  Rate 83. Minimal ST depression, anterolateral leads. Minimal ST elevation, inferior leads  CTA head/neck 04/06/22: IMPRESSION: 1. No acute intracranial pathology. Or emergent large vessel occlusion. 2. Mild intracranial atherosclerotic disease with up to moderate stenosis of the left supraclinoid ICA. No other hemodynamically significant stenosis. 3. Unchanged moderate stenosis of the distal left V2 segment at the level of the C3 transverse foramen due to adjacent osteophytes. Otherwise, patent vasculature of the neck with no other hemodynamically significant stenosis. 4. 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 5. Emphysema (ICD10-J43.9).  MRI Brain 04/06/22: IMPRESSION: No acute intracranial process. No evidence of acute or subacute infarct.  TTE 02/13/2022: 1. Left ventricular ejection fraction, by estimation, is 60 to 65%. The  left ventricle has normal function. The left ventricle demonstrates  regional wall motion abnormalities (see scoring diagram/findings for  description). Left ventricular diastolic  parameters are consistent with Grade I diastolic dysfunction (impaired  relaxation).  2. Right ventricular systolic function is normal. The right ventricular  size is normal.  3. A small pericardial effusion is present. The pericardial effusion is  anterior to the right ventricle. There is no evidence of cardiac  tamponade.  4. The mitral valve is normal in structure. No evidence of mitral valve  regurgitation. No evidence of mitral  stenosis.  5. The aortic valve is tricuspid. There is mild calcification of the  aortic valve. Aortic valve regurgitation is not visualized. Aortic valve  sclerosis is present, with no evidence of aortic valve stenosis.  6. Aortic dilatation noted. There is mild dilatation of the ascending  aorta, measuring 40 mm.  7. The inferior vena cava is normal in size with greater than 50%  respiratory variability, suggesting right atrial pressure of 3 mmHg.   Conclusion(s)/Recommendation(s): No intracardiac source of embolism  detected on this transthoracic study. Consider a transesophageal  echocardiogram to exclude cardiac source of embolism if clinically  indicated.   Nuclear stress 01/27/2017: IMPRESSION: 1. There fixed defect in the inferior wall and anteroseptal walls. No stress-induced perfusion defect. No LV dilatation.  2. Normal left ventricular wall motion.  3. Left ventricular ejection fraction 59%  4. Non invasive risk stratification*: Low risk.  Cath and PCI 11/12/2015: . Mid RCA lesion, 85% stenosed. . Prox LAD lesion, 50% stenosed. . Mid LAD to Dist LAD lesion, 30% stenosed. . Prox Cx to Mid Cx lesion, 20% stenosed. . Mid Cx to Dist Cx lesion, 20% stenosed. . The left ventricular systolic function is normal. . Prox RCA to Mid RCA lesion, 85% stenosed. Post intervention, there is a 0% residual stenosis. . Prox RCA lesion, 80% stenosed. Post intervention, there is a 0% residual stenosis. The lesion was not previously treated.  )     Anesthesia Quick Evaluation

## 2022-07-23 NOTE — Progress Notes (Signed)
I agree with the above plan 

## 2022-07-25 ENCOUNTER — Inpatient Hospital Stay (HOSPITAL_COMMUNITY): Payer: Medicare HMO

## 2022-07-25 ENCOUNTER — Inpatient Hospital Stay (HOSPITAL_COMMUNITY): Payer: Medicare HMO | Admitting: Physician Assistant

## 2022-07-25 ENCOUNTER — Other Ambulatory Visit: Payer: Self-pay

## 2022-07-25 ENCOUNTER — Encounter (HOSPITAL_COMMUNITY): Payer: Self-pay | Admitting: Orthopedic Surgery

## 2022-07-25 ENCOUNTER — Encounter (HOSPITAL_COMMUNITY)
Admission: RE | Disposition: A | Discharge status: Home Health | Payer: Self-pay | Source: Home / Self Care | Attending: Orthopedic Surgery

## 2022-07-25 ENCOUNTER — Inpatient Hospital Stay (HOSPITAL_COMMUNITY)
Admission: RE | Admit: 2022-07-25 | Discharge: 2022-07-28 | DRG: 458 | Disposition: A | Discharge status: Home Health | Payer: Medicare HMO | Attending: Orthopedic Surgery | Admitting: Orthopedic Surgery

## 2022-07-25 ENCOUNTER — Inpatient Hospital Stay (HOSPITAL_COMMUNITY): Payer: Medicare HMO | Admitting: Certified Registered Nurse Anesthetist

## 2022-07-25 DIAGNOSIS — I252 Old myocardial infarction: Secondary | ICD-10-CM

## 2022-07-25 DIAGNOSIS — Z885 Allergy status to narcotic agent status: Secondary | ICD-10-CM | POA: Diagnosis not present

## 2022-07-25 DIAGNOSIS — M5136 Other intervertebral disc degeneration, lumbar region: Secondary | ICD-10-CM | POA: Diagnosis present

## 2022-07-25 DIAGNOSIS — I2510 Atherosclerotic heart disease of native coronary artery without angina pectoris: Secondary | ICD-10-CM | POA: Diagnosis present

## 2022-07-25 DIAGNOSIS — M7138 Other bursal cyst, other site: Secondary | ICD-10-CM | POA: Diagnosis not present

## 2022-07-25 DIAGNOSIS — M48061 Spinal stenosis, lumbar region without neurogenic claudication: Secondary | ICD-10-CM | POA: Diagnosis not present

## 2022-07-25 DIAGNOSIS — Z993 Dependence on wheelchair: Secondary | ICD-10-CM

## 2022-07-25 DIAGNOSIS — J449 Chronic obstructive pulmonary disease, unspecified: Secondary | ICD-10-CM | POA: Diagnosis present

## 2022-07-25 DIAGNOSIS — I10 Essential (primary) hypertension: Secondary | ICD-10-CM

## 2022-07-25 DIAGNOSIS — M4156 Other secondary scoliosis, lumbar region: Secondary | ICD-10-CM | POA: Diagnosis present

## 2022-07-25 DIAGNOSIS — M5459 Other low back pain: Secondary | ICD-10-CM

## 2022-07-25 DIAGNOSIS — M5416 Radiculopathy, lumbar region: Secondary | ICD-10-CM | POA: Diagnosis present

## 2022-07-25 DIAGNOSIS — M48062 Spinal stenosis, lumbar region with neurogenic claudication: Secondary | ICD-10-CM | POA: Diagnosis present

## 2022-07-25 DIAGNOSIS — Z87442 Personal history of urinary calculi: Secondary | ICD-10-CM | POA: Diagnosis not present

## 2022-07-25 DIAGNOSIS — Z87891 Personal history of nicotine dependence: Secondary | ICD-10-CM

## 2022-07-25 DIAGNOSIS — Z9079 Acquired absence of other genital organ(s): Secondary | ICD-10-CM | POA: Diagnosis not present

## 2022-07-25 DIAGNOSIS — G8929 Other chronic pain: Secondary | ICD-10-CM | POA: Diagnosis present

## 2022-07-25 DIAGNOSIS — Z9049 Acquired absence of other specified parts of digestive tract: Secondary | ICD-10-CM

## 2022-07-25 DIAGNOSIS — E785 Hyperlipidemia, unspecified: Secondary | ICD-10-CM | POA: Diagnosis present

## 2022-07-25 DIAGNOSIS — K219 Gastro-esophageal reflux disease without esophagitis: Secondary | ICD-10-CM | POA: Diagnosis present

## 2022-07-25 DIAGNOSIS — Z888 Allergy status to other drugs, medicaments and biological substances status: Secondary | ICD-10-CM | POA: Diagnosis not present

## 2022-07-25 DIAGNOSIS — M2578 Osteophyte, vertebrae: Secondary | ICD-10-CM | POA: Diagnosis present

## 2022-07-25 DIAGNOSIS — K5900 Constipation, unspecified: Secondary | ICD-10-CM | POA: Diagnosis not present

## 2022-07-25 DIAGNOSIS — Z801 Family history of malignant neoplasm of trachea, bronchus and lung: Secondary | ICD-10-CM

## 2022-07-25 DIAGNOSIS — Z981 Arthrodesis status: Principal | ICD-10-CM

## 2022-07-25 DIAGNOSIS — Z8711 Personal history of peptic ulcer disease: Secondary | ICD-10-CM | POA: Diagnosis not present

## 2022-07-25 HISTORY — PX: ABDOMINAL EXPOSURE: SHX5708

## 2022-07-25 SURGERY — OBLIQUE LUMBAR INTERBODY FUSION 2 LEVEL
Anesthesia: General | Site: Spine Lumbar

## 2022-07-25 MED ORDER — METHOCARBAMOL 500 MG PO TABS: 500.0000 mg | ORAL_TABLET | Freq: Three times a day (TID) | ORAL | 0 refills | Status: AC | PRN

## 2022-07-25 MED ORDER — ACETAMINOPHEN 325 MG PO TABS
650.0000 mg | ORAL_TABLET | ORAL | Status: DC | PRN
  Administered 2022-07-25 – 2022-07-28 (×5): 650 mg via ORAL
  Filled 2022-07-25 (×6): qty 2

## 2022-07-25 MED ORDER — BUPIVACAINE-EPINEPHRINE (PF) 0.25% -1:200000 IJ SOLN
INTRAMUSCULAR | Status: AC
  Filled 2022-07-25: qty 30

## 2022-07-25 MED ORDER — BUPIVACAINE HCL (PF) 0.5 % IJ SOLN
INTRAMUSCULAR | Status: DC | PRN
  Administered 2022-07-25: 15 mL via PERINEURAL

## 2022-07-25 MED ORDER — ARTIFICIAL TEARS OPHTHALMIC OINT
TOPICAL_OINTMENT | OPHTHALMIC | Status: AC
  Filled 2022-07-25: qty 3.5

## 2022-07-25 MED ORDER — PROPOFOL 10 MG/ML IV BOLUS
INTRAVENOUS | Status: AC
  Filled 2022-07-25: qty 20

## 2022-07-25 MED ORDER — OXYCODONE HCL 5 MG/5ML PO SOLN: 5.0000 mg | Freq: Once | ORAL | Status: DC | PRN

## 2022-07-25 MED ORDER — CEFAZOLIN SODIUM-DEXTROSE 2-4 GM/100ML-% IV SOLN
2.0000 g | INTRAVENOUS | Status: AC
  Administered 2022-07-25 (×2): 2 g via INTRAVENOUS
  Filled 2022-07-25: qty 100

## 2022-07-25 MED ORDER — PHENOL 1.4 % MT LIQD: 1.0000 | OROMUCOSAL | Status: DC | PRN

## 2022-07-25 MED ORDER — AMISULPRIDE (ANTIEMETIC) 5 MG/2ML IV SOLN: 10.0000 mg | Freq: Once | INTRAVENOUS | Status: DC | PRN

## 2022-07-25 MED ORDER — ONDANSETRON HCL 4 MG PO TABS
4.0000 mg | ORAL_TABLET | Freq: Four times a day (QID) | ORAL | Status: DC | PRN
  Administered 2022-07-28: 4 mg via ORAL
  Filled 2022-07-25: qty 1

## 2022-07-25 MED ORDER — KETAMINE HCL 10 MG/ML IJ SOLN
INTRAMUSCULAR | Status: DC | PRN
  Administered 2022-07-25 (×2): 10 mg via INTRAVENOUS
  Administered 2022-07-25: 20 mg via INTRAVENOUS
  Administered 2022-07-25: 10 mg via INTRAVENOUS

## 2022-07-25 MED ORDER — ROCURONIUM BROMIDE 10 MG/ML (PF) SYRINGE
PREFILLED_SYRINGE | INTRAVENOUS | Status: DC | PRN
  Administered 2022-07-25: 100 mg via INTRAVENOUS
  Administered 2022-07-25: 10 mg via INTRAVENOUS
  Administered 2022-07-25: 40 mg via INTRAVENOUS

## 2022-07-25 MED ORDER — PROPOFOL 10 MG/ML IV BOLUS
INTRAVENOUS | Status: DC | PRN
  Administered 2022-07-25: 130 mg via INTRAVENOUS

## 2022-07-25 MED ORDER — ALBUMIN HUMAN 5 % IV SOLN
INTRAVENOUS | Status: AC
  Filled 2022-07-25: qty 250

## 2022-07-25 MED ORDER — METOPROLOL TARTRATE 12.5 MG HALF TABLET
12.5000 mg | ORAL_TABLET | Freq: Every day | ORAL | Status: DC
  Administered 2022-07-26 – 2022-07-28 (×3): 12.5 mg via ORAL
  Filled 2022-07-25 (×3): qty 1

## 2022-07-25 MED ORDER — HYDROMORPHONE HCL 1 MG/ML IJ SOLN
1.0000 mg | INTRAMUSCULAR | Status: AC | PRN
  Administered 2022-07-26 (×2): 1 mg via INTRAVENOUS
  Filled 2022-07-25 (×2): qty 1

## 2022-07-25 MED ORDER — THROMBIN 20000 UNITS EX SOLR: CUTANEOUS | Status: DC | PRN

## 2022-07-25 MED ORDER — LISINOPRIL 10 MG PO TABS
10.0000 mg | ORAL_TABLET | Freq: Every day | ORAL | Status: DC
  Administered 2022-07-25 – 2022-07-28 (×4): 10 mg via ORAL
  Filled 2022-07-25 (×4): qty 1

## 2022-07-25 MED ORDER — ONDANSETRON HCL 4 MG/2ML IJ SOLN: 4.0000 mg | Freq: Once | INTRAMUSCULAR | Status: DC | PRN

## 2022-07-25 MED ORDER — BUDESON-GLYCOPYRROL-FORMOTEROL 160-9-4.8 MCG/ACT IN AERO: 2.0000 | INHALATION_SPRAY | Freq: Every day | RESPIRATORY_TRACT | Status: DC | PRN

## 2022-07-25 MED ORDER — CHLORHEXIDINE GLUCONATE CLOTH 2 % EX PADS: 6.0000 | MEDICATED_PAD | Freq: Once | CUTANEOUS | Status: DC

## 2022-07-25 MED ORDER — SODIUM CHLORIDE 0.9% FLUSH: 3.0000 mL | INTRAVENOUS | Status: DC | PRN

## 2022-07-25 MED ORDER — DEXAMETHASONE SODIUM PHOSPHATE 10 MG/ML IJ SOLN
INTRAMUSCULAR | Status: DC | PRN
  Administered 2022-07-25: 10 mg via INTRAVENOUS

## 2022-07-25 MED ORDER — FLUTICASONE PROPIONATE 50 MCG/ACT NA SUSP
1.0000 | Freq: Two times a day (BID) | NASAL | Status: DC | PRN
Start: 2022-07-25 — End: 2022-07-29

## 2022-07-25 MED ORDER — HEPARIN 6000 UNIT IRRIGATION SOLUTION
Status: AC
  Filled 2022-07-25: qty 500

## 2022-07-25 MED ORDER — SODIUM CHLORIDE 0.9% FLUSH
3.0000 mL | Freq: Two times a day (BID) | INTRAVENOUS | Status: DC
  Administered 2022-07-25 – 2022-07-27 (×7): 3 mL via INTRAVENOUS

## 2022-07-25 MED ORDER — 0.9 % SODIUM CHLORIDE (POUR BTL) OPTIME
TOPICAL | Status: DC | PRN
  Administered 2022-07-25 (×2): 1000 mL

## 2022-07-25 MED ORDER — ACETAMINOPHEN 650 MG RE SUPP: 650.0000 mg | RECTAL | Status: DC | PRN

## 2022-07-25 MED ORDER — BUPIVACAINE LIPOSOME 1.3 % IJ SUSP
INTRAMUSCULAR | Status: DC | PRN
  Administered 2022-07-25: 10 mL via PERINEURAL

## 2022-07-25 MED ORDER — CHLORHEXIDINE GLUCONATE 0.12 % MT SOLN
15.0000 mL | Freq: Once | OROMUCOSAL | Status: AC
  Administered 2022-07-25: 15 mL via OROMUCOSAL
  Filled 2022-07-25: qty 15

## 2022-07-25 MED ORDER — MOMETASONE FURO-FORMOTEROL FUM 200-5 MCG/ACT IN AERO: 2.0000 | INHALATION_SPRAY | Freq: Two times a day (BID) | RESPIRATORY_TRACT | Status: DC | PRN

## 2022-07-25 MED ORDER — HYDROMORPHONE HCL 1 MG/ML IJ SOLN
0.2500 mg | INTRAMUSCULAR | Status: DC | PRN
  Administered 2022-07-25 (×2): 0.5 mg via INTRAVENOUS

## 2022-07-25 MED ORDER — MENTHOL 3 MG MT LOZG: 1.0000 | LOZENGE | OROMUCOSAL | Status: DC | PRN

## 2022-07-25 MED ORDER — SENNOSIDES-DOCUSATE SODIUM 8.6-50 MG PO TABS
1.0000 | ORAL_TABLET | Freq: Two times a day (BID) | ORAL | Status: DC
  Administered 2022-07-25 – 2022-07-27 (×4): 1 via ORAL
  Filled 2022-07-25 (×5): qty 1

## 2022-07-25 MED ORDER — HYDROMORPHONE HCL 1 MG/ML IJ SOLN
INTRAMUSCULAR | Status: AC
  Filled 2022-07-25: qty 1

## 2022-07-25 MED ORDER — ORAL CARE MOUTH RINSE: 15.0000 mL | Freq: Once | OROMUCOSAL | Status: AC

## 2022-07-25 MED ORDER — TRANEXAMIC ACID-NACL 1000-0.7 MG/100ML-% IV SOLN
1000.0000 mg | INTRAVENOUS | Status: AC
  Administered 2022-07-25: 1000 mg via INTRAVENOUS
  Filled 2022-07-25: qty 100

## 2022-07-25 MED ORDER — ONDANSETRON HCL 4 MG PO TABS: 4.0000 mg | ORAL_TABLET | Freq: Three times a day (TID) | ORAL | 0 refills | Status: DC | PRN

## 2022-07-25 MED ORDER — POLYETHYLENE GLYCOL 3350 17 G PO PACK
17.0000 g | PACK | Freq: Every day | ORAL | Status: DC | PRN
  Administered 2022-07-27: 17 g via ORAL
  Filled 2022-07-25: qty 1

## 2022-07-25 MED ORDER — OXYCODONE HCL 5 MG PO TABS
10.0000 mg | ORAL_TABLET | ORAL | Status: DC | PRN
  Administered 2022-07-25 – 2022-07-28 (×14): 10 mg via ORAL
  Filled 2022-07-25 (×16): qty 2

## 2022-07-25 MED ORDER — SODIUM CHLORIDE 0.9 % IV SOLN: INTRAVENOUS | Status: DC

## 2022-07-25 MED ORDER — LIDOCAINE 2% (20 MG/ML) 5 ML SYRINGE
INTRAMUSCULAR | Status: DC | PRN
  Administered 2022-07-25: 100 mg via INTRAVENOUS

## 2022-07-25 MED ORDER — ONDANSETRON HCL 4 MG/2ML IJ SOLN: 4.0000 mg | Freq: Four times a day (QID) | INTRAMUSCULAR | Status: DC | PRN

## 2022-07-25 MED ORDER — ONDANSETRON HCL 4 MG/2ML IJ SOLN
INTRAMUSCULAR | Status: DC | PRN
  Administered 2022-07-25 (×2): 4 mg via INTRAVENOUS

## 2022-07-25 MED ORDER — CEFAZOLIN SODIUM-DEXTROSE 1-4 GM/50ML-% IV SOLN
1.0000 g | Freq: Three times a day (TID) | INTRAVENOUS | Status: AC
  Administered 2022-07-25 (×2): 1 g via INTRAVENOUS
  Filled 2022-07-25 (×2): qty 50

## 2022-07-25 MED ORDER — OXYCODONE-ACETAMINOPHEN 10-325 MG PO TABS: 1.0000 | ORAL_TABLET | Freq: Four times a day (QID) | ORAL | 0 refills | Status: AC | PRN

## 2022-07-25 MED ORDER — FENTANYL CITRATE (PF) 250 MCG/5ML IJ SOLN
INTRAMUSCULAR | Status: AC
  Filled 2022-07-25: qty 5

## 2022-07-25 MED ORDER — OXYCODONE HCL 5 MG PO TABS: 5.0000 mg | ORAL_TABLET | ORAL | Status: DC | PRN

## 2022-07-25 MED ORDER — PANTOPRAZOLE SODIUM 40 MG PO TBEC
40.0000 mg | DELAYED_RELEASE_TABLET | Freq: Every day | ORAL | Status: DC | PRN
  Administered 2022-07-26 – 2022-07-27 (×2): 40 mg via ORAL
  Filled 2022-07-25 (×2): qty 1

## 2022-07-25 MED ORDER — AMLODIPINE BESYLATE 5 MG PO TABS
5.0000 mg | ORAL_TABLET | Freq: Every day | ORAL | Status: DC
  Administered 2022-07-26 – 2022-07-28 (×3): 5 mg via ORAL
  Filled 2022-07-25 (×3): qty 1

## 2022-07-25 MED ORDER — METHOCARBAMOL 1000 MG/10ML IJ SOLN: 500.0000 mg | Freq: Four times a day (QID) | INTRAVENOUS | Status: DC | PRN

## 2022-07-25 MED ORDER — LACTATED RINGERS IV SOLN: INTRAVENOUS | Status: DC

## 2022-07-25 MED ORDER — SURGIFLO WITH THROMBIN (HEMOSTATIC MATRIX KIT) OPTIME
TOPICAL | Status: DC | PRN
  Administered 2022-07-25: 1 via TOPICAL

## 2022-07-25 MED ORDER — UMECLIDINIUM BROMIDE 62.5 MCG/ACT IN AEPB: 1.0000 | INHALATION_SPRAY | Freq: Every day | RESPIRATORY_TRACT | Status: DC | PRN

## 2022-07-25 MED ORDER — FLEET ENEMA 7-19 GM/118ML RE ENEM: 1.0000 | ENEMA | Freq: Once | RECTAL | Status: DC | PRN

## 2022-07-25 MED ORDER — SUGAMMADEX SODIUM 200 MG/2ML IV SOLN
INTRAVENOUS | Status: DC | PRN
  Administered 2022-07-25: 200 mg via INTRAVENOUS

## 2022-07-25 MED ORDER — ACETAMINOPHEN 10 MG/ML IV SOLN
INTRAVENOUS | Status: DC | PRN
  Administered 2022-07-25: 1000 mg via INTRAVENOUS

## 2022-07-25 MED ORDER — LACTATED RINGERS IV SOLN: INTRAVENOUS | Status: DC | PRN

## 2022-07-25 MED ORDER — METHOCARBAMOL 500 MG PO TABS
500.0000 mg | ORAL_TABLET | Freq: Four times a day (QID) | ORAL | Status: DC | PRN
  Administered 2022-07-25 – 2022-07-27 (×7): 500 mg via ORAL
  Filled 2022-07-25 (×8): qty 1

## 2022-07-25 MED ORDER — OXYCODONE HCL 5 MG PO TABS: 5.0000 mg | ORAL_TABLET | Freq: Once | ORAL | Status: DC | PRN

## 2022-07-25 MED ORDER — ACETAMINOPHEN 10 MG/ML IV SOLN
INTRAVENOUS | Status: AC
  Filled 2022-07-25: qty 100

## 2022-07-25 MED ORDER — BUPIVACAINE-EPINEPHRINE (PF) 0.25% -1:200000 IJ SOLN
INTRAMUSCULAR | Status: DC | PRN
  Administered 2022-07-25: 10 mL

## 2022-07-25 MED ORDER — ATORVASTATIN CALCIUM 10 MG PO TABS
10.0000 mg | ORAL_TABLET | Freq: Every day | ORAL | Status: DC
  Administered 2022-07-25 – 2022-07-28 (×4): 10 mg via ORAL
  Filled 2022-07-25 (×4): qty 1

## 2022-07-25 MED ORDER — PHENYLEPHRINE 80 MCG/ML (10ML) SYRINGE FOR IV PUSH (FOR BLOOD PRESSURE SUPPORT)
PREFILLED_SYRINGE | INTRAVENOUS | Status: DC | PRN
  Administered 2022-07-25 (×2): 80 ug via INTRAVENOUS
  Administered 2022-07-25 (×2): 160 ug via INTRAVENOUS
  Administered 2022-07-25: 80 ug via INTRAVENOUS

## 2022-07-25 MED ORDER — FENTANYL CITRATE (PF) 250 MCG/5ML IJ SOLN
INTRAMUSCULAR | Status: DC | PRN
Start: 2022-07-25 — End: 2022-07-25
  Administered 2022-07-25 (×2): 25 ug via INTRAVENOUS
  Administered 2022-07-25: 75 ug via INTRAVENOUS
  Administered 2022-07-25: 50 ug via INTRAVENOUS

## 2022-07-25 MED ORDER — ALBUMIN HUMAN 5 % IV SOLN
12.5000 g | Freq: Once | INTRAVENOUS | Status: AC
  Administered 2022-07-25: 12.5 g via INTRAVENOUS

## 2022-07-25 MED ORDER — PHENYLEPHRINE HCL-NACL 20-0.9 MG/250ML-% IV SOLN
INTRAVENOUS | Status: DC | PRN
  Administered 2022-07-25: 30 ug/min via INTRAVENOUS

## 2022-07-25 MED ORDER — DEXMEDETOMIDINE HCL IN NACL 200 MCG/50ML IV SOLN
INTRAVENOUS | Status: DC | PRN
  Administered 2022-07-25 (×2): 4 ug via INTRAVENOUS
  Administered 2022-07-25: 8 ug via INTRAVENOUS
  Administered 2022-07-25: 4 ug via INTRAVENOUS
  Administered 2022-07-25 (×2): 8 ug via INTRAVENOUS
  Administered 2022-07-25: 4 ug via INTRAVENOUS

## 2022-07-25 MED ORDER — KETAMINE HCL 50 MG/5ML IJ SOSY
PREFILLED_SYRINGE | INTRAMUSCULAR | Status: AC
  Filled 2022-07-25: qty 5

## 2022-07-25 MED ORDER — THROMBIN 20000 UNITS EX SOLR
CUTANEOUS | Status: AC
  Filled 2022-07-25: qty 20000

## 2022-07-25 SURGICAL SUPPLY — 88 items
AGENT HMST KT MTR STRL THRMB (HEMOSTASIS) ×2
APPLIER CLIP 11 MED OPEN (CLIP)
APR CLP MED 11 20 MLT OPN (CLIP)
BAG COUNTER SPONGE SURGICOUNT (BAG) ×6 IMPLANT
BAG SPNG CNTER NS LX DISP (BAG) ×4
BLADE CLIPPER SURG (BLADE) IMPLANT
BLADE SURG 10 STRL SS (BLADE) ×3 IMPLANT
CATH FOLEY 2WAY SLVR  5CC 16FR (CATHETERS) ×3
CATH FOLEY 2WAY SLVR 5CC 16FR (CATHETERS) ×2 IMPLANT
CLIP APPLIE 11 MED OPEN (CLIP) ×2 IMPLANT
CLIP LIGATING EXTRA MED SLVR (CLIP) IMPLANT
CLSR STERI-STRIP ANTIMIC 1/2X4 (GAUZE/BANDAGES/DRESSINGS) ×1 IMPLANT
CNTNR URN SCR LID CUP LEK RST (MISCELLANEOUS) IMPLANT
CONT SPEC 4OZ STRL OR WHT (MISCELLANEOUS) ×3
COVER SURGICAL LIGHT HANDLE (MISCELLANEOUS) ×3 IMPLANT
DRAIN CHANNEL 15F RND FF W/TCR (WOUND CARE) IMPLANT
DRAPE C-ARM 42X72 X-RAY (DRAPES) ×3 IMPLANT
DRAPE C-ARMOR (DRAPES) ×3 IMPLANT
DRAPE INCISE IOBAN 66X45 STRL (DRAPES) ×1 IMPLANT
DRSG OPSITE POSTOP 4X10 (GAUZE/BANDAGES/DRESSINGS) ×1 IMPLANT
DRSG OPSITE POSTOP 4X6 (GAUZE/BANDAGES/DRESSINGS) ×3 IMPLANT
DURAPREP 26ML APPLICATOR (WOUND CARE) ×3 IMPLANT
ELECT BLADE 4.0 EZ CLEAN MEGAD (MISCELLANEOUS) ×6
ELECT PENCIL ROCKER SW 15FT (MISCELLANEOUS) ×4 IMPLANT
ELECT REM PT RETURN 9FT ADLT (ELECTROSURGICAL) ×3
ELECTRODE BLDE 4.0 EZ CLN MEGD (MISCELLANEOUS) ×4 IMPLANT
ELECTRODE REM PT RTRN 9FT ADLT (ELECTROSURGICAL) ×2 IMPLANT
GLOVE BIO SURGEON STRL SZ 6.5 (GLOVE) ×4 IMPLANT
GLOVE BIO SURGEON STRL SZ7.5 (GLOVE) ×4 IMPLANT
GLOVE BIOGEL PI IND STRL 6.5 (GLOVE) ×2 IMPLANT
GLOVE BIOGEL PI IND STRL 8 (GLOVE) ×2 IMPLANT
GLOVE BIOGEL PI IND STRL 8.5 (GLOVE) ×2 IMPLANT
GLOVE BIOGEL PI INDICATOR 6.5 (GLOVE) ×3
GLOVE BIOGEL PI INDICATOR 8 (GLOVE) ×6
GLOVE BIOGEL PI INDICATOR 8.5 (GLOVE) ×6
GLOVE SS BIOGEL STRL SZ 8.5 (GLOVE) ×2 IMPLANT
GLOVE SUPERSENSE BIOGEL SZ 8.5 (GLOVE) ×6
GOWN STRL REUS W/ TWL LRG LVL3 (GOWN DISPOSABLE) ×4 IMPLANT
GOWN STRL REUS W/ TWL XL LVL3 (GOWN DISPOSABLE) ×6 IMPLANT
GOWN STRL REUS W/TWL 2XL LVL3 (GOWN DISPOSABLE) ×6 IMPLANT
GOWN STRL REUS W/TWL LRG LVL3 (GOWN DISPOSABLE) ×6
GOWN STRL REUS W/TWL XL LVL3 (GOWN DISPOSABLE) ×9
KIT BASIN OR (CUSTOM PROCEDURE TRAY) ×3 IMPLANT
KIT TURNOVER KIT B (KITS) ×3 IMPLANT
NDL SPNL 18GX3.5 QUINCKE PK (NEEDLE) ×2 IMPLANT
NEEDLE 22X1 1/2 (OR ONLY) (NEEDLE) ×3 IMPLANT
NEEDLE SPNL 18GX3.5 QUINCKE PK (NEEDLE) ×3 IMPLANT
NS IRRIG 1000ML POUR BTL (IV SOLUTION) ×3 IMPLANT
PACK LAMINECTOMY ORTHO (CUSTOM PROCEDURE TRAY) ×3 IMPLANT
PACK UNIVERSAL I (CUSTOM PROCEDURE TRAY) ×3 IMPLANT
PAD ARMBOARD 7.5X6 YLW CONV (MISCELLANEOUS) ×12 IMPLANT
PATTIES SURGICAL .5 X1 (DISPOSABLE) ×1 IMPLANT
PUTTY BONE DBX 5CC MIX (Putty) ×3 IMPLANT
PUTTY DBM INSTAFILL CART 5CC (Putty) ×3 IMPLANT
SPACER RISEL 22X50 7-14MM (Spacer) ×1 IMPLANT
SPACER RISEL 22X55 8-15MM 6D (Spacer) ×1 IMPLANT
SPONGE INTESTINAL PEANUT (DISPOSABLE) ×7 IMPLANT
SPONGE SURGIFOAM ABS GEL 100 (HEMOSTASIS) ×3 IMPLANT
SPONGE T-LAP 18X18 ~~LOC~~+RFID (SPONGE) ×3 IMPLANT
SPONGE T-LAP 4X18 ~~LOC~~+RFID (SPONGE) ×3 IMPLANT
STAPLER VISISTAT 35W (STAPLE) ×3 IMPLANT
SURGIFLO W/THROMBIN 8M KIT (HEMOSTASIS) ×1 IMPLANT
SUT BONE WAX W31G (SUTURE) IMPLANT
SUT MNCRL AB 3-0 PS2 27 (SUTURE) ×5 IMPLANT
SUT PDS AB 1 CT1 36 (SUTURE) ×1 IMPLANT
SUT PROLENE 4 0 RB 1 (SUTURE)
SUT PROLENE 4-0 RB1 .5 CRCL 36 (SUTURE) IMPLANT
SUT PROLENE 5 0 CC1 (SUTURE) IMPLANT
SUT PROLENE 6 0 C 1 30 (SUTURE) IMPLANT
SUT PROLENE 6 0 CC (SUTURE) IMPLANT
SUT SILK 0 TIES 10X30 (SUTURE) IMPLANT
SUT SILK 2 0 TIES 10X30 (SUTURE) ×3 IMPLANT
SUT SILK 2 0SH CR/8 30 (SUTURE) IMPLANT
SUT SILK 3 0 TIES 17X18 (SUTURE)
SUT SILK 3 0SH CR/8 30 (SUTURE) IMPLANT
SUT SILK 3-0 18XBRD TIE BLK (SUTURE) IMPLANT
SUT VIC AB 0 CT1 27 (SUTURE) ×3
SUT VIC AB 0 CT1 27XBRD ANBCTR (SUTURE) IMPLANT
SUT VIC AB 1 CT1 18XCR BRD 8 (SUTURE) ×4 IMPLANT
SUT VIC AB 1 CT1 8-18 (SUTURE) ×3
SUT VIC AB 2-0 CT1 18 (SUTURE) ×6 IMPLANT
SYR BULB IRRIG 60ML STRL (SYRINGE) ×4 IMPLANT
TAPE CLOTH 4X10 WHT NS (GAUZE/BANDAGES/DRESSINGS) ×4 IMPLANT
TIP CONICAL INSTAFILL (ORTHOPEDIC DISPOSABLE SUPPLIES) ×1 IMPLANT
TOWEL GREEN STERILE (TOWEL DISPOSABLE) ×6 IMPLANT
TOWEL GREEN STERILE FF (TOWEL DISPOSABLE) ×3 IMPLANT
WATER STERILE IRR 1000ML POUR (IV SOLUTION) ×3 IMPLANT
YANKAUER SUCT BULB TIP NO VENT (SUCTIONS) ×1 IMPLANT

## 2022-07-25 NOTE — Transfer of Care (Signed)
Immediate Anesthesia Transfer of Care Note  Patient: RAYHAAN HUSTER  Procedure(s) Performed: OBLIQUE LUMBAR INTERBODY FUSION LUMBAR THREE TO FIVE (Spine Lumbar) ABDOMINAL EXPOSURE FOR ANTERIOR LUMBAR SPINE SURGERY (Abdomen)  Patient Location: PACU  Anesthesia Type:General  Level of Consciousness: drowsy and patient cooperative  Airway & Oxygen Therapy: Patient Spontanous Breathing and Patient connected to nasal cannula oxygen  Post-op Assessment: Report given to RN, Post -op Vital signs reviewed and stable and Patient moving all extremities X 4  Post vital signs: Reviewed and stable  Last Vitals:  Vitals Value Taken Time  BP 115/62 07/25/22 1212  Temp    Pulse 83 07/25/22 1215  Resp 20 07/25/22 1215  SpO2 93 % 07/25/22 1215  Vitals shown include unvalidated device data.  Last Pain:  Vitals:   07/25/22 0628  TempSrc:   PainSc: 0-No pain         Complications: No notable events documented.

## 2022-07-25 NOTE — Anesthesia Procedure Notes (Signed)
Procedure Name: Intubation Date/Time: 07/25/2022 7:56 AM  Performed by: Darletta Moll, CRNAPre-anesthesia Checklist: Patient identified, Emergency Drugs available, Suction available and Patient being monitored Patient Re-evaluated:Patient Re-evaluated prior to induction Oxygen Delivery Method: Circle system utilized Preoxygenation: Pre-oxygenation with 100% oxygen Induction Type: IV induction Ventilation: Mask ventilation without difficulty and Oral airway inserted - appropriate to patient size Laryngoscope Size: Mac and 4 Grade View: Grade I Tube type: Oral Tube size: 7.5 mm Number of attempts: 1 Airway Equipment and Method: Stylet and Oral airway Placement Confirmation: ETT inserted through vocal cords under direct vision, positive ETCO2 and breath sounds checked- equal and bilateral Secured at: 22 cm Tube secured with: Tape Dental Injury: Teeth and Oropharynx as per pre-operative assessment  Comments: Inserted by Laurence Spates

## 2022-07-25 NOTE — Op Note (Signed)
OPERATIVE REPORT  DATE OF SURGERY: 07/25/2022  PATIENT NAME:  Francisco Collier MRN: [id number] DOB: [birth year]  PCP: Ferd Hibbs, NP  PRE-OPERATIVE DIAGNOSIS: Degenerative spinal stenosis with neurogenic claudication and lower extremity weakness L3-4 L4-5.  Previous posterior decompression L3-5 with recurrent foraminal stenosis  POST-OPERATIVE DIAGNOSIS: Same  PROCEDURE:   Oblique lumbar interbody fusion L3-5  SURGEON:  Melina Schools, MD Approach surgeon: Dr. Monica Martinez  PHYSICIAN ASSISTANT: None  ANESTHESIA:   General  EBL: 626 ml   Complications: None  Implants: Globus Rise L intervertebral cage.  L4-5: 8-15 mm, 6 degree lordosis.  Cage was expanded to 14.5 mm.  L3-4: 7 to 14 mm 0 degree lordosis.  Cage expanded to 13 mm  Graft: DBX mix/DBX  BRIEF HISTORY: Francisco Collier is a 81 y.o. male who started having severe back and radicular right leg pain with weakness approximately 4 months ago.  Had so much pain that he stopped walking he was wheelchair-bound.  Ultimately patient failed to improve with injection therapy and medications.  Imaging demonstrated recurrent severe foraminal stenosis with nerve compression L3-4 L4-5 and previous decompression L3-5.  As result of the failure to improve we elected to move forward with surgery.  All appropriate risks, benefits and alternatives to surgery were discussed and consent was obtained.  PROCEDURE DETAILS: Patient was brought into the operating room and was properly positioned on the operating room table.  After induction with general anesthesia the patient was endotracheally intubated.  A timeout was taken to confirm all important data: including patient, procedure, and the level. Teds, SCD's were applied.   Foley was placed by the nurse and the patient was turned into the lateral decubitus position left side up.  Axillary roll was placed, all bony prominences were well-padded and the abdomen and back were prepped and draped  in a standard fashion.  At this time Dr. Carlis Abbott scrubbed then and performed a standard oblique retroperitoneal approach to the lumbar spine.  Once the L4-5 disc space was properly exposed and the retractors were placed a needle was placed into the L4-5 disc space.  Intraoperative fluoroscopy confirmed with the appropriate level.  Please refer to Dr. Ainsley Spinner dictation for specifics on the approach.  Once we confirm the level of Dr. Carlis Abbott scrubbed out and I proceeded with the case.  An annulotomy was performed with a blade scalpel and then I used a cobb elevator to release the disc from the endplates of L5 and L4.  This was done using fluoroscopy to guide my cobb elevator.  Box osteotome was then used to remove the bulk of the disc material.  I made sure to release the contralateral annulus.  I then used curettes and Kerrison rongeurs to remove all of the disc material.  Using a fine curved curette I began releasing posteriorly.  I then used a fine nerve hook to develop a plane behind the vertebral body of L5 and sweep behind the annulus.  I then used a 2 mm Kerrison rongeur to resect the posterior annulus and trim down the posterior spurs from the vertebral bodies of L4 and L5.  Using fluoroscopy I confirmed that I was working from the left L4 pedicle across to the right ensuring had an adequate decompression.  At this point with the posterior annulus resected and the bone spurs were removed I then rasped the endplates to ensure had bleeding subchondral bone.  I then measured the length for the cage and obtained the appropriate implant.  The  implant was packed with DBX mix and then gently inserted to the disc space.  Once I confirmed satisfactory position of the implant in both planes I then expanded the cage to the final height.  The cage had excellent overall contact and restored foraminal space and also corrected some of the degenerative scoliosis that was present.  I then backfilled the cage with additional DBX  mix and then placed DBX and DBX mix anterior to the cage to facilitate a sentinel fusion.  Wound was irrigated copiously with normal saline and hemostasis was obtained.  At this point time Dr. Carlis Abbott then scrubbed back into the case and reposition the retractors to now expose the L3-4 disc space.  A needle was placed and an x-ray again confirmed we are at the appropriate level.  Using a similar technique that had used at L4-5 I completed a annulotomy and released the contralateral annulus using the Cobb elevator.  I removed all of the disc material with a box osteotome and curettes and Kerrison rongeurs.  I again dissected gently posteriorly through the posterior annulus until I could create a plane that I could then resect with a 2 mm and 3 mm Kerrison rongeur.  The posterior osteophyte was also taken down to facilitate the decompression.  I again confirmed I decompressed from the left L3 pedicle across to the right.  At this point time I was satisfied with the overall decompression and discectomy.  The endplates were rasped and a trial implant was placed to determine the length.  The implant was then obtained packed with the allograft bone and placed gently into position.  Once position was confirmed with fluoroscopy I expanded the cage to the final height.  I then backfilled the cage with DBX and DBX mix and placed the remainder of the graft material along the anterior portion of the implant to facilitate a sentinel fusion.  The wound was then copes irrigated with normal saline and the retractors were sequentially removed to confirm hemostasis.  The fascia of the external obliques were then closed with a running #1 PDS suture.  Superficial tissue was closed with interrupted 2-0 Vicryl suture, 3-0 Monocryl was closed used to close the skin.  Prior to skin closure intraoperative digital x-ray was taken and read by the radiologist to confirm there is no retained surgical instruments in the wound.  Steri-Strips  and a dry dressing were applied and the patient was ultimately extubated transfer the PACU without incident.  The end of the case all needle sponge counts were correct.  There were no adverse intraoperative events.  Melina Schools, MD 07/25/2022 12:12 PM

## 2022-07-25 NOTE — H&P (Signed)
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  The various methods of treatment have been discussed with the patient and family. After consideration of risks, benefits and other options for treatment, the patient has consented to  Procedure(s) with comments: OBLIQUE LUMBAR INTERBODY FUSION 2 LEVEL (OLIF L3-5) (N/A) - 5 hrs Dr. Carlis Abbott to do approach Left tap block with exparel 3 C-Bed ABDOMINAL EXPOSURE (N/A) as a surgical intervention.  The patient's history has been reviewed, patient examined, no change in status, stable for surgery.  I have reviewed the patient's chart and labs.  Questions were answered to the patient's satisfaction.    OLIF L3-L4 and L4-L5  Marty Heck  Patient name: Francisco Collier          MRN: [id number]        DOB: June 09, 1941          Sex: male   REASON FOR CONSULT: Evaluate for abdominal exposure for L3-L5 OLIF   HPI: Francisco Collier is a 81 y.o. male, with history of coronary artery disease s/p MI, COPD, hypertension, hyperlipidemia that presents for evaluation of possible abdominal exposure for L3-L4 and L4-L5 OLIF.  He describes about 3 months ago he got severe sudden back pain and developed numbness in both legs.  He now has weakness in both lower extremities as well.  Apparently was independent and had good mobility before this event 3 months ago.  He has been evaluated by Dr. Rolena Infante and ultimately vascular surgery has been asked to evaluate for possible multilevel abdominal approach for OLIF from L3-L5.  Patient's previous abdominal surgery includes a laparotomy for what was felt to be a gastric ulcer and ultimately ended up being appendicitis and also cholecystectomy.       Past Medical History:  Diagnosis Date   Arthritis      back   CAD (coronary artery disease)     COPD (chronic obstructive  pulmonary disease) (HCC)     Diverticulitis     Esophageal stricture     Gastric ulcer     GERD (gastroesophageal reflux disease)     Hyperlipemia     Hypertension     Kidney stone     MI (myocardial infarction) ([place]) 12/13/2003   Prostatic hypertrophy     S/P orchiectomy     Shortness of breath     Vertigo             Past Surgical History:  Procedure Laterality Date   ANGIOPLASTY   2005    Stent   APPENDECTOMY       BACK SURGERY       CARDIAC CATHETERIZATION N/A 11/12/2015    Procedure: Left Heart Cath and Coronary Angiography;  Surgeon: Charolette Forward, MD;  Location: [facility];  Service: Cardiovascular;  Laterality: N/A;   CHOLECYSTECTOMY       CHOLECYSTECTOMY       CORONARY ANGIOPLASTY       ESOPHAGOGASTRODUODENOSCOPY N/A 10/06/2020    Procedure: ESOPHAGOGASTRODUODENOSCOPY (EGD);  Surgeon: Jerene Bears, MD;  Location: [facility];  Service: Gastroenterology;  Laterality: N/A;   FOREIGN BODY REMOVAL   10/06/2020    Procedure: FOREIGN BODY REMOVAL;  Surgeon: Jerene Bears, MD;  Location: [facility];  Service: Gastroenterology;;   THYROID SURGERY  Family History  Problem Relation Age of Onset   Lung cancer Father     Colon cancer Neg Hx     Migraines Neg Hx        SOCIAL HISTORY: Social History         Socioeconomic History   Marital status: Widowed      Spouse name: Charleen   Number of children: 0   Years of education: 12   Highest education level: Not on file  Occupational History   Occupation: Retired  Tobacco Use   Smoking status: Former      Years: 40.00      Types: Cigarettes      Quit date: 12/12/1998      Years since quitting: 23.5   Smokeless tobacco: Never  Vaping Use   Vaping Use: Never used  Substance and Sexual Activity   Alcohol use: No   Drug use: No   Sexual activity: Not on file  Other Topics Concern   Not on file  Social History Narrative    Caffeine: none    Social Determinants of Health         Financial Resource Strain: Not on file  Food Insecurity: No Food Insecurity (02/21/2022)    Hunger Vital Sign     Worried About Running Out of Food in the Last Year: Never true     Ran Out of Food in the Last Year: Never true  Transportation Needs: No Transportation Needs (02/21/2022)    PRAPARE - Armed forces logistics/support/administrative officer (Medical): No     Lack of Transportation (Non-Medical): No  Physical Activity: Not on file  Stress: Not on file  Social Connections: Not on file  Intimate Partner Violence: Not on file           Allergies  Allergen Reactions   Codeine Nausea Only      Pill form causes nausea Iv form does not   Niaspan [Niacin Er] Other (See Comments)      Severe flushing   Statins Rash      Headaches             Current Outpatient Medications  Medication Sig Dispense Refill   amLODipine (NORVASC) 10 MG tablet Take 10 mg by mouth daily.       Ascorbic Acid (VITAMIN C PO) Take 1 capsule by mouth daily.       atorvastatin (LIPITOR) 10 MG tablet Take 10 mg by mouth daily.       clopidogrel (PLAVIX) 75 MG tablet Take 1 tablet (75 mg total) by mouth daily. 30 tablet 1   Cyanocobalamin (VITAMIN B-12 PO) Take 1 capsule by mouth daily.       fenofibrate 160 MG tablet Take 160 mg by mouth daily.       fluticasone (FLONASE) 50 MCG/ACT nasal spray Place 1 spray into both nostrils 2 (two) times daily as needed for allergies or rhinitis.        lisinopril (PRINIVIL) 10 MG tablet Take 1 tablet (10 mg total) by mouth daily. 30 tablet 3   meclizine (ANTIVERT) 25 MG tablet Take 1 tablet (25 mg total) by mouth 3 (three) times daily as needed for dizziness. 30 tablet 0   metoprolol tartrate (LOPRESSOR) 25 MG tablet Take 0.5 tablets (12.5 mg total) by mouth 2 (two) times daily. 60 tablet 3   Multiple Vitamins-Minerals (ZINC PO) Take 1 capsule by mouth daily.       Omega-3 Fatty Acids (FISH OIL  PO) Take 1 capsule by mouth daily.       oxyCODONE-acetaminophen (PERCOCET) 10-325 MG  tablet Take 0.5 tablets by mouth 3 (three) times daily as needed for pain.       pantoprazole (PROTONIX) 20 MG tablet Take 1 tablet (20 mg total) by mouth daily. (Patient taking differently: Take 20 mg by mouth daily as needed for heartburn or indigestion.) 30 tablet 0   VITAMIN D PO Take 1 capsule by mouth daily.       diclofenac Sodium (VOLTAREN) 1 % GEL Apply 2 g topically 4 (four) times daily. (Patient not taking: Reported on 04/06/2022)        No current facility-administered medications for this visit.      REVIEW OF SYSTEMS:  '[X]'$  denotes positive finding, '[ ]'$  denotes negative finding Cardiac   Comments:  Chest pain or chest pressure:      Shortness of breath upon exertion:      Short of breath when lying flat:      Irregular heart rhythm:             Vascular      Pain in calf, thigh, or hip brought on by ambulation:      Pain in feet at night that wakes you up from your sleep:       Blood clot in your veins:      Leg swelling:              Pulmonary      Oxygen at home:      Productive cough:       Wheezing:              Neurologic      Sudden weakness in arms or legs:  x    Sudden numbness in arms or legs:  x    Sudden onset of difficulty speaking or slurred speech:      Temporary loss of vision in one eye:       Problems with dizziness:              Gastrointestinal      Blood in stool:       Vomited blood:              Genitourinary      Burning when urinating:       Blood in urine:             Psychiatric      Major depression:              Hematologic      Bleeding problems:      Problems with blood clotting too easily:             Skin      Rashes or ulcers:             Constitutional      Fever or chills:          PHYSICAL EXAM:    Vitals:    06/21/22 1430  BP: (!) 144/80  Pulse: 73  Resp: 18  Temp: 98 F (36.7 C)  TempSrc: Temporal  SpO2: 94%  Weight: 215 lb (97.5 kg)  Height: '6\' 1"'$  (1.854 m)      GENERAL: The patient is a  well-nourished male, in no acute distress. The vital signs are documented above. CARDIAC: There is a regular rate and rhythm.  VASCULAR:  Hard to appreciate femoral pulses with him sitting in a  wheelchair (unable to get on table) Bilateral PT pulses palpable PULMONARY: No respiratory distress. ABDOMEN: Soft and non-tender.  Midline abdominal incision MUSCULOSKELETAL: There are no major deformities or cyanosis. SKIN: There are no ulcers or rashes noted. PSYCHIATRIC: The patient has a normal affect.   DATA:    MRI reviewed        Assessment/Plan:   81 y.o. male, with history of coronary artery disease, COPD, hypertension, hyperlipidemia that presents for evaluation of possible abdominal exposure for L3-L4 and L4-L5 OLIF.  I discussed him being in the right lateral position and then making a oblique incision over his left abdominal wall around the L3-L4 and L4-L5 disc space and then entering the retroperitoneum to mobilize the peritoneum and left ureter away from the disc space.  Discussed mobilizing the left psoas to get to the disc space exposed.  Discussed possibly moving the iliac artery and vein and risk of injury to the above structures.  All questions answered.  I will let Dr. Rolena Infante know he is a reasonable candidate from my standpoint.  He also has an appointment with neurology tomorrow as well to further evaluate his symptoms.     Marty Heck, MD [facility] [place] Office: [phone]

## 2022-07-25 NOTE — Brief Op Note (Signed)
07/25/2022  12:24 PM  PATIENT:  Francisco Collier  81 y.o. 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 Carlis Abbott to do approach Left tap block with exparel 3 C-Bed ABDOMINAL EXPOSURE FOR ANTERIOR LUMBAR SPINE SURGERY (N/A)  SURGEON:  Surgeon(s) and Role: Panel 1:    Melina Schools, MD - Primary Panel 2:    Marty Heck, MD - Primary  PHYSICIAN ASSISTANT:   ASSISTANTS: none   ANESTHESIA:   general  EBL:  200 mL   BLOOD ADMINISTERED:none  DRAINS: none   LOCAL MEDICATIONS USED:  NONE  SPECIMEN:  No Specimen  DISPOSITION OF SPECIMEN:  N/A  COUNTS:  YES  TOURNIQUET:  * No tourniquets in log *  DICTATION: .Dragon Dictation  PLAN OF CARE: Admit to inpatient   PATIENT DISPOSITION:  PACU - hemodynamically stable.

## 2022-07-25 NOTE — H&P (Signed)
History: Francisco Collier is a very pleasant 81 year old gentleman who had a previous two-level decompression L3-5 and now has recurrent spinal stenosis and degenerative disc disease.  Despite conservative treatment he has had progressive neurological deficits and radicular pain.  He has been unable to ambulate since the pain intensified 4 months ago.  The result of the neurological deficits, lack of improvement we have elected to move forward with a staged anterior posterior spinal fusion with possible revision decompression.   Past Medical History:  Diagnosis Date   Arthritis    back   CAD (coronary artery disease)    COPD (chronic obstructive pulmonary disease) (HCC)    Diverticulitis    Esophageal stricture    Gastric ulcer    GERD (gastroesophageal reflux disease)    Hyperlipemia    Hypertension    Kidney stone    MI (myocardial infarction) ([place]) 12/13/2003   Prostatic hypertrophy    S/P orchiectomy    Shortness of breath    Vertigo     Allergies  Allergen Reactions   Codeine Nausea Only    Pill form causes nausea Iv form does not   Niaspan [Niacin Er] Other (See Comments)    Severe flushing   Neurontin [Gabapentin] Other (See Comments)    Dizziness Double vision   Statins Rash    Headaches     No current facility-administered medications on file prior to encounter.   Current Outpatient Medications on File Prior to Encounter  Medication Sig Dispense Refill   Ascorbic Acid (VITAMIN C PO) Take 1 capsule by mouth daily.     atorvastatin (LIPITOR) 10 MG tablet Take 10 mg by mouth daily.     clopidogrel (PLAVIX) 75 MG tablet Take 1 tablet (75 mg total) by mouth daily. 30 tablet 1   Cyanocobalamin (VITAMIN B-12 PO) Take 1 capsule by mouth daily.     fenofibrate 160 MG tablet Take 160 mg by mouth daily.     lisinopril (PRINIVIL) 10 MG tablet Take 1 tablet (10 mg total) by mouth daily. 30 tablet 3   meclizine (ANTIVERT) 25 MG tablet Take 1 tablet (25 mg total) by mouth 3  (three) times daily as needed for dizziness. 30 tablet 0   metoprolol tartrate (LOPRESSOR) 25 MG tablet Take 0.5 tablets (12.5 mg total) by mouth 2 (two) times daily. (Patient taking differently: Take 6.25 mg by mouth 2 (two) times daily.) 60 tablet 3   Multiple Vitamins-Minerals (ZINC PO) Take 1 capsule by mouth daily.     Omega-3 Fatty Acids (FISH OIL PO) Take 1 capsule by mouth daily.     oxyCODONE-acetaminophen (PERCOCET) 10-325 MG tablet Take 0.5 tablets by mouth 3 (three) times daily as needed for pain.     pantoprazole (PROTONIX) 20 MG tablet Take 1 tablet (20 mg total) by mouth daily. 30 tablet 0   fluticasone (FLONASE) 50 MCG/ACT nasal spray Place 1 spray into both nostrils 2 (two) times daily as needed for allergies.      Physical Exam: Vitals:   07/25/22 0539 07/25/22 0628  BP: 134/74   Pulse: 70   Resp: 18   Temp: 97.9 F (36.6 C)   SpO2:  99%   Body mass index is 28.37 kg/m.  Clinical exam: Francisco Collier is a pleasant individual, who appears younger than their stated age.  He is alert and orientated 3.  No shortness of breath, chest pain.  Abdomen is soft and non-tender, negative loss of bowel and bladder control, no rebound  tenderness.  Negative: skin lesions abrasions contusions  Peripheral pulses: 2+ dorsalis pedis/posterior tibialis pulses bilaterally. LE compartments are: Soft and nontender.  Gait pattern: Patient has been nonambulatory x3 to 4 months. He is unable to stand and ambulate due to severe lower extremity weakness  Assistive devices: Wheelchair/scooter  Neuro: 2/5 lower extremity strength throughout. Weakness is bilateral. Positive right increased radicular leg pain compared to the left. Negative Babinski test, negative Hoffman test, no clonus. 2+ deep tendon reflexes at the knee and Achilles. Decreased sensation to light touch primarily in the right lower extremity.  Musculoskeletal: Significant back pain with palpation and attempts at forward flexion  and extension. No SI joint pain. Patient has a well-healed surgical scar from her prior two-level lumbar decompression L3-5.  Imaging: X-rays of the lumbar spine demonstrate mild to moderate degenerative disc disease L3-4 L4-5. Decreased overall lordosis is noted. No acute fracture is seen.  Lumbar MRI: completed on 05/21/2022: Mild stenosis L1-3. No fracture or evidence of abnormal marrow signal change other than hemangioma within the anterior L3 vertebral body. Bilateral L3 laminectomies and partial facetectomies are noted at L3-4 and L4-5. There is severe foraminal stenosis bilaterally right side worse than the left at L3-4 affecting primarily the exiting L3 nerve root. There is a right superiorly directed synovial cyst at L4-5 that does contribute to lateral recess stenosis. There is severe right foraminal stenosis affecting the exiting L4 nerve root. Mild degenerative disc bulge at L5-S1 but no foraminal narrowing.  A/P: Summary: Francisco Collier is a very pleasant 81 year old gentleman who had a previous two-level decompression L3-5 and now has recurrent spinal stenosis and degenerative disc disease.  Despite conservative treatment he has had progressive neurological deficits and radicular pain.  He has been unable to ambulate since the pain intensified 4 months ago.  The result of the neurological deficits, lack of improvement we have elected to move forward with a staged anterior posterior spinal fusion with possible revision decompression.  We did go over the surgical procedure which would be an OLIF L3-5 with revision posterior decompression instrumented fusion. This would be done in a 2 staged procedure.  OLIF/XLIF risks, benefits of surgery were reviewed with the patient. These include: infection, bleeding, death, stroke, paralysis, ongoing or worse pain, need for additional surgery, injury to the lumbar plexus resulting in hip flexor weakness and difficulty walking without assistive devices. Adjacent  segment degenerative disease, need for additional surgery including fusing other levels, leak of spinal fluid, Nonunion, hardware failure, breakage, or mal-position. Deep venous thrombosis (DVT) requiring additional treatment such as filter, and/or medications. Injury to abdominal contents, loss in bowel and bladder control.  Risks and benefits of decompression/discectomy: Infection, bleeding, death, stroke, paralysis, ongoing or worse pain, need for additional surgery, leak of spinal fluid, adjacent segment degeneration requiring additional surgery, post-operative hematoma formation that can result in neurological compromise and the need for urgent/emergent re-operation. Loss in bowel and bladder control. Injury to major vessels that could result in the need for urgent abdominal surgery to stop bleeding. Risk of deep venous thrombosis (DVT) and the need for additional treatment. Recurrent disc herniation resulting in the need for revision surgery, which could include fusion surgery (utilizing instrumentation such as pedicle screws and intervertebral cages). Additional risk: If instrumentation is used there is a risk of migration, or breakage of that hardware that could require additional surgery.  The goal of surgery is to improve his condition. This would mean reducing his pain and hopefully giving him the ability to  ambulate without the wheelchair

## 2022-07-25 NOTE — OR Nursing (Signed)
No retained or foreign objects noted in surgical site per Dr. Owens Shark, Radiologist.

## 2022-07-25 NOTE — Anesthesia Procedure Notes (Signed)
Anesthesia Regional Block: TAP block   Pre-Anesthetic Checklist: , timeout performed,  Correct Patient, Correct Site, Correct Laterality,  Correct Procedure, Correct Position, site marked,  Risks and benefits discussed,  Surgical consent,  Pre-op evaluation,  At surgeon's request and post-op pain management  Laterality: Left  Prep: chloraprep       Needles:  Injection technique: Single-shot  Needle Type: Echogenic Stimulator Needle     Needle Length: 10cm  Needle Gauge: 20     Additional Needles:   Procedures:,,,, ultrasound used (permanent image in chart),,    Narrative:  Start time: 07/25/2022 7:12 AM End time: 07/25/2022 7:16 AM Injection made incrementally with aspirations every 5 mL.  Performed by: Personally  Anesthesiologist: Lidia Collum, MD  Additional Notes: Standard monitors applied. Skin prepped. Good needle visualization with ultrasound. Injection made in 5cc increments with no resistance to injection. Patient tolerated the procedure well.

## 2022-07-25 NOTE — Op Note (Signed)
Date: July 25, 2022  Preoperative diagnosis: Chronic lower back pain with degenerative disc disease  Postoperative diagnosis: Same  Procedure: Oblique spine exposure at the L3-L4 and L4-L5 disc space via oblique retroperitoneal approach for L3-L4 and L4-L5 OLIF  Surgeon: Dr. Marty Heck, MD  Co-surgeon: Dr. Melina Schools, MD  Indications: 81 year old male with chronic lower back pain that has been evaluated by Dr. Rolena Infante who has recommended an L3-L4 and L4-L5 OLIF.  Vascular surgery was asked to assist with oblique spine exposure.  He presents today after risks-benefits discussed.  Findings: Patient was placed in the right lateral position and an oblique incision was made at the L3-L4 and L4-5 disc level over the left oblique muscles.  An incision was made 2 fingerbreadths off the left iliac crest and ultimately a muscle-sparing technique was performed.  The retroperitoneum was entered after the peritoneum and left ureter were mobilized away from the disc space.  We then mobilized the left psoas lateral from the L3-L4 and then L4-L5 disc space.  Initial placed retractors at L4-L5 and then moved the retractors to L3-L4.  Anesthesia: General  Details: Patient was taken to the operating room after informed consent was obtained.  Placed on the operative table in the supine position.  General endotracheal anesthesia was induced.  Patient was then placed in the right lateral position and all pressure points padded.  Fluoroscopic C-arm was then used to mark the L3-L4 and L4-5 disc space over the left oblique muscles.  The abdominal wall, left flank, and left back were then prepped and draped in standard sterile fashion.  Antibiotics were given and timeout performed.  Initially made an oblique incision over the left oblique muscles approximately two fingerbreadth's off the left iliac crest.  Dissected down with Bovie cautery and used cerebellar retractors for added visualization.  We divided the  layers of the left sided oblique muscles in a muscle sparing technique.  We visualized the peritoneum that was swept down and entered into the retroperitoneum and I visualized the left psoas muscle as well as the left iliac vessels.  Hand-held Wiley retractors were used to fully expose the L4-L5 and L3-L4 level.  I used Kd and suction to mobilize the left psoas muscle away from the L4-5 discase.  I then moved up to the next higher level and did the same at the L3-L4 level.  I did ligate several lumbar branches and a segmental branch between clips.  I initially placed a globus retractor at L4-L5 with a 160 reverse lip was placed at the L4-5 level to pull the peritoneum and left ureter down as well as the iliac vessels.  We then placed two 140 reverse lips lateral to pull the left psoas muscle lateral.  A spinal needle was placed in the disc space.  Case was turned over to Dr. Rolena Infante.  Please see his dictation.  I was called back in the room once he was completed working at the L4-L5 level.  I then scrubbed back into the case and moved the retractor up one level to the L3-L4 level.  Case was turned over to Dr. Rolena Infante and we confirmed we were at the correct level with a spinal needle in the space on lateral fluorscopy.  Complication: None  Condition: Stable  Marty Heck, MD [facility] Office: [place]

## 2022-07-25 NOTE — Anesthesia Procedure Notes (Signed)
Arterial Line Insertion Start/End8/14/2023 7:00 AM, 07/25/2022 7:10 AM Performed by: Darletta Moll, CRNA, CRNA  Patient location: Pre-op. Preanesthetic checklist: patient identified, IV checked, site marked, risks and benefits discussed, surgical consent, monitors and equipment checked, pre-op evaluation, timeout performed and anesthesia consent Lidocaine 1% used for infiltration Right, radial was placed Catheter size: 20 G Hand hygiene performed , maximum sterile barriers used  and Seldinger technique used  Attempts: 1 Procedure performed without using ultrasound guided technique. Following insertion, dressing applied and Biopatch. Post procedure assessment: normal  Additional procedure comments: Inserted by Laurence Spates.

## 2022-07-25 NOTE — Anesthesia Postprocedure Evaluation (Signed)
Anesthesia Post Note  Patient: Francisco Collier  Procedure(s) Performed: OBLIQUE LUMBAR INTERBODY FUSION LUMBAR THREE TO FIVE (Spine Lumbar) ABDOMINAL EXPOSURE FOR ANTERIOR LUMBAR SPINE SURGERY (Abdomen)     Patient location during evaluation: PACU Anesthesia Type: General Level of consciousness: awake and alert Pain management: pain level controlled Vital Signs Assessment: post-procedure vital signs reviewed and stable Respiratory status: spontaneous breathing, nonlabored ventilation and respiratory function stable Cardiovascular status: blood pressure returned to baseline and stable Postop Assessment: no apparent nausea or vomiting Anesthetic complications: no   No notable events documented.  Last Vitals:  Vitals:   07/25/22 1300 07/25/22 1315  BP: 119/61 (!) 90/44  Pulse: 78 75  Resp: 20 20  Temp:    SpO2: 95% 96%    Last Pain:  Vitals:   07/25/22 1318  TempSrc:   PainSc: 7                  Ancel Easler E Moshe Wenger

## 2022-07-25 NOTE — Discharge Instructions (Addendum)
Spinal Fusion, Adult, Care After This sheet gives you information about how to care for yourself after your procedure. Your doctor may also give you more specific instructions. If you have problems or questions, contact your doctor. Follow these instructions at home: Medicines Take over-the-counter and prescription medicines only as told by your doctor. These include any medicines for pain or blood-thinning medicines (anticoagulants). If you were prescribed an antibiotic medicine, take it as told by your doctor. Do not stop taking the antibiotic even if you start to feel better. Do not drive for 24 hours if you were given a medicine to help you relax (sedative) during your procedure. Do not drive or use heavy machinery while taking prescription pain medicine. If you have a brace: Wear the brace as told by your doctor. Take it off only as told by your doctor. Keep the brace clean. Managing pain, stiffness, and swelling If directed, put ice on the surgery area: If you have a removable brace, take it off as told by your doctor. Put ice in a plastic bag. Place a towel between your skin and the bag. Leave the ice on for 20 minutes, 2-3 times a day. Surgery cut care    Follow instructions from your doctor about how to take care of your cut from surgery (incision). Make sure you: Wash your hands with soap and water before you change your bandage (dressing). If you cannot use soap and water, use hand sanitizer. Change your bandage as told by your doctor. Leave stitches (sutures), skin glue, or skin tape (adhesive) strips in place. They may need to stay in place for 2 weeks or longer. If tape strips get loose and curl up, you may trim the loose edges. Do not remove tape strips completely unless your doctor says it is okay. Keep your cut from surgery clean and dry. Do not take baths, swim, or use a hot tub until your doctor says it is okay. Ask your doctor if you can take showers. You may  only be allowed to take sponge baths. Every day, check your cut from surgery and the area around it for: More redness, swelling, or pain. Fluid or blood. Warmth. Pus or a bad smell. If you have a drain tube, follow instructions from your doctor about caring for it. Do not take out the drain tube or any bandages unless your doctor says it is okay. Physical activity Rest and protect your back as much as possible. Follow instructions from your doctor about how to move. Use good posture to help your spine heal. Do not lift anything that is heavier than 8 lb (3.6 kg), or the limit that you are told, until your doctor says that it is safe. Do not twist or bend at the waist until your doctor says it is okay. It is best if you: Do not make pushing and pulling motions. Do not sit or lie down in the same position for a long time. Do not raise your hands or arms above your head. Return to your normal activities as told by your doctor. Ask your doctor what activities are safe for you. Rest and protect your back as much as you can. Do not start to exercise until your doctor says it is okay. Ask your doctor what kinds of exercise you can do to make your back stronger. Ok to shower in 5 days.  Do not take a bath or submerge the wound General instructions To prevent blood clots and  lessen swelling in your legs: Wear compression stockings as told. Walk one or more times every few hours as told by your doctor. Do not use any products that contain nicotine or tobacco, such as cigarettes and e-cigarettes. These can delay bone healing. If you need help quitting, ask your doctor. To prevent or treat constipation while you are taking prescription pain medicine, your doctor may suggest that you: Drink enough fluid to keep your pee (urine) pale yellow. Take over-the-counter or prescription medicines. Eat foods that are high in fiber. These include fresh fruits and vegetables, whole grains, and beans. Limit foods  that are high in fat and processed sugars, such as fried and sweet foods. Keep all follow-up visits as told by your doctor. This is important. Contact a doctor if: Your pain gets worse. Your medicine does not help your pain. Your legs or feet get painful or swollen. Your cut from surgery is more red, swollen, or painful. Your cut from surgery feels warm to the touch. You have: Fluid or blood coming from your cut from surgery. Pus or a bad smell coming from your cut from surgery. A fever. Weakness or loss of feeling (numbness) in your legs that is new or getting worse. Trouble controlling when you pee (urinate) or poop (have a bowel movement). You feel sick to your stomach (nauseous). You throw up (vomit). Get help right away if: Your pain is very bad. You have chest pain. You have trouble breathing. You start to have a cough. These symptoms may be an emergency. Do not wait to see if the symptoms will go away. Get medical help right away. Call your local emergency services (911 in the [place] Do not drive yourself to the hospital. Summary After the procedure, it is common to have pain in your back and pain by your surgery cut(s). Icing and pain medicines may help to control the pain. Follow directions from your doctor. Rest and protect your back as much as possible. Do not twist or bend at the waist. Get up and walk one or more times every few hours as told by your doctor. This information is not intended to replace advice given to you by your health care provider. Make sure you discuss any questions you have with your health care provider.   RESTART PLAVIX ON THURSDAY 07/28/22

## 2022-07-26 ENCOUNTER — Inpatient Hospital Stay (HOSPITAL_COMMUNITY): Payer: Medicare HMO | Admitting: Anesthesiology

## 2022-07-26 ENCOUNTER — Inpatient Hospital Stay (HOSPITAL_COMMUNITY): Admission: RE | Admit: 2022-07-26 | Payer: Medicare HMO | Source: Home / Self Care | Admitting: Orthopedic Surgery

## 2022-07-26 ENCOUNTER — Encounter (HOSPITAL_COMMUNITY)
Admission: RE | Disposition: A | Discharge status: Home Health | Payer: Self-pay | Source: Home / Self Care | Attending: Orthopedic Surgery

## 2022-07-26 ENCOUNTER — Inpatient Hospital Stay (HOSPITAL_COMMUNITY): Payer: Medicare HMO

## 2022-07-26 ENCOUNTER — Encounter (HOSPITAL_COMMUNITY): Payer: Self-pay | Admitting: Orthopedic Surgery

## 2022-07-26 ENCOUNTER — Other Ambulatory Visit: Payer: Self-pay | Admitting: Physician Assistant

## 2022-07-26 ENCOUNTER — Other Ambulatory Visit: Payer: Self-pay

## 2022-07-26 DIAGNOSIS — Z87891 Personal history of nicotine dependence: Secondary | ICD-10-CM

## 2022-07-26 DIAGNOSIS — M7138 Other bursal cyst, other site: Secondary | ICD-10-CM | POA: Diagnosis not present

## 2022-07-26 DIAGNOSIS — J449 Chronic obstructive pulmonary disease, unspecified: Secondary | ICD-10-CM

## 2022-07-26 DIAGNOSIS — M5136 Other intervertebral disc degeneration, lumbar region: Secondary | ICD-10-CM

## 2022-07-26 DIAGNOSIS — M48061 Spinal stenosis, lumbar region without neurogenic claudication: Secondary | ICD-10-CM | POA: Diagnosis not present

## 2022-07-26 SURGERY — POSTERIOR LUMBAR FUSION 2 LEVEL
Anesthesia: General

## 2022-07-26 MED ORDER — PROPOFOL 10 MG/ML IV BOLUS
INTRAVENOUS | Status: DC | PRN
  Administered 2022-07-26: 120 mg via INTRAVENOUS

## 2022-07-26 MED ORDER — FENTANYL CITRATE (PF) 250 MCG/5ML IJ SOLN
INTRAMUSCULAR | Status: DC | PRN
Start: 2022-07-26 — End: 2022-07-26
  Administered 2022-07-26: 100 ug via INTRAVENOUS
  Administered 2022-07-26 (×3): 50 ug via INTRAVENOUS

## 2022-07-26 MED ORDER — LIDOCAINE 2% (20 MG/ML) 5 ML SYRINGE
INTRAMUSCULAR | Status: DC | PRN
  Administered 2022-07-26: 100 mg via INTRAVENOUS

## 2022-07-26 MED ORDER — PROPOFOL 500 MG/50ML IV EMUL
INTRAVENOUS | Status: DC | PRN
  Administered 2022-07-26: 50 ug/kg/min via INTRAVENOUS

## 2022-07-26 MED ORDER — THROMBIN (RECOMBINANT) 20000 UNITS EX SOLR
CUTANEOUS | Status: AC
  Filled 2022-07-26: qty 20000

## 2022-07-26 MED ORDER — HYDROMORPHONE HCL 1 MG/ML IJ SOLN
0.2500 mg | INTRAMUSCULAR | Status: DC | PRN
  Administered 2022-07-26: 0.5 mg via INTRAVENOUS

## 2022-07-26 MED ORDER — PHENYLEPHRINE HCL-NACL 20-0.9 MG/250ML-% IV SOLN
INTRAVENOUS | Status: DC | PRN
  Administered 2022-07-26: 20 ug/min via INTRAVENOUS

## 2022-07-26 MED ORDER — PROPOFOL 10 MG/ML IV BOLUS
INTRAVENOUS | Status: AC
  Filled 2022-07-26: qty 20

## 2022-07-26 MED ORDER — CHLORHEXIDINE GLUCONATE 0.12 % MT SOLN: 15.0000 mL | Freq: Once | OROMUCOSAL | Status: DC

## 2022-07-26 MED ORDER — FENTANYL CITRATE (PF) 250 MCG/5ML IJ SOLN
INTRAMUSCULAR | Status: AC
  Filled 2022-07-26: qty 5

## 2022-07-26 MED ORDER — SUCCINYLCHOLINE CHLORIDE 200 MG/10ML IV SOSY
PREFILLED_SYRINGE | INTRAVENOUS | Status: DC | PRN
  Administered 2022-07-26: 130 mg via INTRAVENOUS

## 2022-07-26 MED ORDER — 0.9 % SODIUM CHLORIDE (POUR BTL) OPTIME
TOPICAL | Status: DC | PRN
  Administered 2022-07-26: 2000 mL

## 2022-07-26 MED ORDER — LACTATED RINGERS IV SOLN: INTRAVENOUS | Status: DC | PRN

## 2022-07-26 MED ORDER — BUPIVACAINE-EPINEPHRINE (PF) 0.25% -1:200000 IJ SOLN
INTRAMUSCULAR | Status: DC | PRN
  Administered 2022-07-26: 20 mL

## 2022-07-26 MED ORDER — ORAL CARE MOUTH RINSE: 15.0000 mL | Freq: Once | OROMUCOSAL | Status: DC

## 2022-07-26 MED ORDER — ROCURONIUM BROMIDE 10 MG/ML (PF) SYRINGE
PREFILLED_SYRINGE | INTRAVENOUS | Status: AC
  Filled 2022-07-26: qty 10

## 2022-07-26 MED ORDER — ONDANSETRON HCL 4 MG/2ML IJ SOLN
INTRAMUSCULAR | Status: DC | PRN
  Administered 2022-07-26: 4 mg via INTRAVENOUS

## 2022-07-26 MED ORDER — ACETAMINOPHEN 10 MG/ML IV SOLN
INTRAVENOUS | Status: AC
  Filled 2022-07-26: qty 100

## 2022-07-26 MED ORDER — AMISULPRIDE (ANTIEMETIC) 5 MG/2ML IV SOLN: 10.0000 mg | Freq: Once | INTRAVENOUS | Status: DC | PRN

## 2022-07-26 MED ORDER — CEFAZOLIN SODIUM-DEXTROSE 1-4 GM/50ML-% IV SOLN
1.0000 g | Freq: Three times a day (TID) | INTRAVENOUS | Status: AC
  Administered 2022-07-26 – 2022-07-27 (×2): 1 g via INTRAVENOUS
  Filled 2022-07-26 (×2): qty 50

## 2022-07-26 MED ORDER — HYDROMORPHONE HCL 1 MG/ML IJ SOLN
INTRAMUSCULAR | Status: AC
  Filled 2022-07-26: qty 1

## 2022-07-26 MED ORDER — OXYCODONE HCL 5 MG PO TABS: 5.0000 mg | ORAL_TABLET | Freq: Once | ORAL | Status: DC | PRN

## 2022-07-26 MED ORDER — CEFAZOLIN SODIUM 1 G IJ SOLR
INTRAMUSCULAR | Status: AC
  Filled 2022-07-26: qty 30

## 2022-07-26 MED ORDER — ONDANSETRON HCL 4 MG/2ML IJ SOLN
INTRAMUSCULAR | Status: AC
  Filled 2022-07-26: qty 2

## 2022-07-26 MED ORDER — BUPIVACAINE-EPINEPHRINE (PF) 0.25% -1:200000 IJ SOLN
INTRAMUSCULAR | Status: AC
  Filled 2022-07-26: qty 30

## 2022-07-26 MED ORDER — ACETAMINOPHEN 10 MG/ML IV SOLN
INTRAVENOUS | Status: DC | PRN
  Administered 2022-07-26: 1000 mg via INTRAVENOUS

## 2022-07-26 MED ORDER — ONDANSETRON HCL 4 MG/2ML IJ SOLN: 4.0000 mg | Freq: Once | INTRAMUSCULAR | Status: DC | PRN

## 2022-07-26 MED ORDER — LACTATED RINGERS IV SOLN: INTRAVENOUS | Status: DC

## 2022-07-26 MED ORDER — OXYCODONE HCL 5 MG/5ML PO SOLN: 5.0000 mg | Freq: Once | ORAL | Status: DC | PRN

## 2022-07-26 MED ORDER — PHENYLEPHRINE 80 MCG/ML (10ML) SYRINGE FOR IV PUSH (FOR BLOOD PRESSURE SUPPORT)
PREFILLED_SYRINGE | INTRAVENOUS | Status: DC | PRN
  Administered 2022-07-26 (×2): 160 ug via INTRAVENOUS

## 2022-07-26 MED ORDER — DEXAMETHASONE SODIUM PHOSPHATE 10 MG/ML IJ SOLN
INTRAMUSCULAR | Status: DC | PRN
  Administered 2022-07-26: 10 mg via INTRAVENOUS

## 2022-07-26 MED ORDER — DEXAMETHASONE SODIUM PHOSPHATE 10 MG/ML IJ SOLN
INTRAMUSCULAR | Status: AC
  Filled 2022-07-26: qty 1

## 2022-07-26 MED ORDER — THROMBIN 20000 UNITS EX SOLR
CUTANEOUS | Status: DC | PRN
  Administered 2022-07-26: 20 mL

## 2022-07-26 MED ORDER — LIDOCAINE 2% (20 MG/ML) 5 ML SYRINGE
INTRAMUSCULAR | Status: AC
  Filled 2022-07-26: qty 5

## 2022-07-26 MED ORDER — CEFAZOLIN SODIUM-DEXTROSE 2-3 GM-%(50ML) IV SOLR
INTRAVENOUS | Status: DC | PRN
  Administered 2022-07-26: 2 g via INTRAVENOUS

## 2022-07-26 MED ORDER — CHLORHEXIDINE GLUCONATE 0.12 % MT SOLN
OROMUCOSAL | Status: AC
  Filled 2022-07-26: qty 15

## 2022-07-26 SURGICAL SUPPLY — 83 items
AGENT HMST KT MTR STRL THRMB (HEMOSTASIS) ×1
BAG COUNTER SPONGE SURGICOUNT (BAG) ×4 IMPLANT
BAG SPNG CNTER NS LX DISP (BAG) ×2
BAND INSRT 18 STRL LF DISP RB (MISCELLANEOUS)
BAND RUBBER #18 3X1/16 STRL (MISCELLANEOUS) IMPLANT
BLADE CLIPPER SURG (BLADE) IMPLANT
BUR EGG ELITE 4.0 (BURR) IMPLANT
BUR MATCHSTICK NEURO 3.0 LAGG (BURR) IMPLANT
CABLE BIPOLOR RESECTION CORD (MISCELLANEOUS) ×2 IMPLANT
CLIP NEUROVISION LG (CLIP) ×1 IMPLANT
CLSR STERI-STRIP ANTIMIC 1/2X4 (GAUZE/BANDAGES/DRESSINGS) ×2 IMPLANT
CORD BIPOLAR FORCEPS 12FT (ELECTRODE) ×2 IMPLANT
COVER MAYO STAND STRL (DRAPES) IMPLANT
COVER SURGICAL LIGHT HANDLE (MISCELLANEOUS) ×2 IMPLANT
DRAIN CHANNEL 15F RND FF W/TCR (WOUND CARE) IMPLANT
DRAPE C-ARM 42X72 X-RAY (DRAPES) ×2 IMPLANT
DRAPE C-ARMOR (DRAPES) ×2 IMPLANT
DRAPE MICROSCOPE LEICA 46X105 (MISCELLANEOUS) IMPLANT
DRAPE POUCH INSTRU U-SHP 10X18 (DRAPES) ×4 IMPLANT
DRAPE SURG 17X11 SM STRL (DRAPES) ×2 IMPLANT
DRAPE SURG 17X23 STRL (DRAPES) ×2 IMPLANT
DRAPE U-SHAPE 47X51 STRL (DRAPES) ×4 IMPLANT
DRSG OPSITE POSTOP 4X6 (GAUZE/BANDAGES/DRESSINGS) ×2 IMPLANT
DRSG OPSITE POSTOP 4X8 (GAUZE/BANDAGES/DRESSINGS) ×2 IMPLANT
DURAPREP 26ML APPLICATOR (WOUND CARE) ×4 IMPLANT
ELECT BLADE 4.0 EZ CLEAN MEGAD (MISCELLANEOUS)
ELECT BLADE 6.5 EXT (BLADE) ×2 IMPLANT
ELECT CAUTERY BLADE 6.4 (BLADE) ×4 IMPLANT
ELECT PENCIL ROCKER SW 15FT (MISCELLANEOUS) ×4 IMPLANT
ELECT REM PT RETURN 9FT ADLT (ELECTROSURGICAL) ×4
ELECTRODE BLDE 4.0 EZ CLN MEGD (MISCELLANEOUS) IMPLANT
ELECTRODE REM PT RTRN 9FT ADLT (ELECTROSURGICAL) ×2 IMPLANT
EVACUATOR SILICONE 100CC (DRAIN) IMPLANT
GLOVE BIO SURGEON STRL SZ 6.5 (GLOVE) ×2 IMPLANT
GLOVE BIOGEL PI IND STRL 6.5 (GLOVE) ×1 IMPLANT
GLOVE BIOGEL PI IND STRL 8.5 (GLOVE) ×2 IMPLANT
GLOVE BIOGEL PI INDICATOR 6.5 (GLOVE) ×2
GLOVE BIOGEL PI INDICATOR 8.5 (GLOVE) ×4
GLOVE SS BIOGEL STRL SZ 8.5 (GLOVE) ×2 IMPLANT
GLOVE SUPERSENSE BIOGEL SZ 8.5 (GLOVE) ×4
GOWN STRL REUS W/ TWL LRG LVL3 (GOWN DISPOSABLE) ×2 IMPLANT
GOWN STRL REUS W/TWL 2XL LVL3 (GOWN DISPOSABLE) ×8 IMPLANT
GOWN STRL REUS W/TWL LRG LVL3 (GOWN DISPOSABLE) ×4
GUIDEWIRE NITINOL BEVEL TIP (WIRE) ×6 IMPLANT
KIT BASIN OR (CUSTOM PROCEDURE TRAY) ×4 IMPLANT
KIT POSITION SURG JACKSON T1 (MISCELLANEOUS) ×2 IMPLANT
KIT TURNOVER KIT B (KITS) ×2 IMPLANT
MODULE EMG NDL SSEP NVM5 (NEEDLE) IMPLANT
MODULE EMG NEEDLE SSEP NVM5 (NEEDLE) ×1 IMPLANT
MODULE NVM5 NEXT GEN EMG (NEEDLE) ×1 IMPLANT
NDL SPNL 18GX3.5 QUINCKE PK (NEEDLE) ×3 IMPLANT
NEEDLE 22X1 1/2 (OR ONLY) (NEEDLE) ×4 IMPLANT
NEEDLE SPNL 18GX3.5 QUINCKE PK (NEEDLE) ×6 IMPLANT
NS IRRIG 1000ML POUR BTL (IV SOLUTION) ×4 IMPLANT
PACK LAMINECTOMY ORTHO (CUSTOM PROCEDURE TRAY) ×4 IMPLANT
PACK UNIVERSAL I (CUSTOM PROCEDURE TRAY) ×4 IMPLANT
PAD ARMBOARD 7.5X6 YLW CONV (MISCELLANEOUS) ×4 IMPLANT
POSITIONER HEAD PRONE TRACH (MISCELLANEOUS) ×2 IMPLANT
PROBE BALL TIP NVM5 SNG USE (BALLOONS) ×1 IMPLANT
ROD RELINE MAS LORD 5.5X75MM (Rod) ×1 IMPLANT
ROD SPINAL 5.5X80 TI LORDOSE (Rod) ×1 IMPLANT
SCREW LOCK RELINE 5.5 TULIP (Screw) ×6 IMPLANT
SCREW RELINE RED 6.5X50MM POLY (Screw) ×6 IMPLANT
SPONGE SURGIFOAM ABS GEL 100 (HEMOSTASIS) ×2 IMPLANT
SPONGE T-LAP 4X18 ~~LOC~~+RFID (SPONGE) ×4 IMPLANT
STAPLER VISISTAT 35W (STAPLE) IMPLANT
SURGIFLO W/THROMBIN 8M KIT (HEMOSTASIS) ×1 IMPLANT
SUT BONE WAX W31G (SUTURE) ×4 IMPLANT
SUT MNCRL+ AB 3-0 CT1 36 (SUTURE) IMPLANT
SUT MONOCRYL AB 3-0 CT1 36IN (SUTURE) ×4
SUT VIC AB 1 CT1 18XCR BRD 8 (SUTURE) ×2 IMPLANT
SUT VIC AB 1 CT1 27 (SUTURE)
SUT VIC AB 1 CT1 27XBRD ANTBC (SUTURE) IMPLANT
SUT VIC AB 1 CT1 8-18 (SUTURE) ×4
SUT VIC AB 1 CTX 36 (SUTURE)
SUT VIC AB 1 CTX36XBRD ANBCTR (SUTURE) IMPLANT
SUT VIC AB 2-0 CT1 18 (SUTURE) ×4 IMPLANT
SUT VICRYL 0 UR6 27IN ABS (SUTURE) ×3 IMPLANT
SYR BULB IRRIG 60ML STRL (SYRINGE) ×4 IMPLANT
SYR CONTROL 10ML LL (SYRINGE) ×4 IMPLANT
TOWEL GREEN STERILE (TOWEL DISPOSABLE) ×4 IMPLANT
TOWEL GREEN STERILE FF (TOWEL DISPOSABLE) ×4 IMPLANT
YANKAUER SUCT BULB TIP NO VENT (SUCTIONS) ×2 IMPLANT

## 2022-07-26 NOTE — Evaluation (Signed)
Physical Therapy Evaluation  Patient Details Name: Francisco Collier MRN: [id number] DOB: [birth year] Today's Date: 07/26/2022  History of Present Illness  Pt is a 81 y/o male who presents s/p L3-L5 OLIF on 07/25/2022. He returns to the OR 07/26/2022 for second stage of surgery. PMH significant for CAD, COPD, diverticulitis, esophageal stricture, HTN, MI, prostatic hypertrophy, vertigo.   Clinical Impression  Pt admitted with above diagnosis. At the time of PT eval, pt was able to demonstrate transfers and ambulation with gross min-mod assist and RW for support. Chair follow was helpful for hallway ambulation. Pt reports improvement in LE symptoms and states he has walked farther today than he has in months. Pt currently with functional limitations due to the deficits listed below (see PT Problem List). Pt will benefit from skilled PT to increase their independence and safety with mobility to allow discharge to the venue listed below.         Recommendations for follow up therapy are one component of a multi-disciplinary discharge planning process, led by the attending physician.  Recommendations may be updated based on patient status, additional functional criteria and insurance authorization.  Follow Up Recommendations Home health PT      Assistance Recommended at Discharge Intermittent Supervision/Assistance  Patient can return home with the following  A little help with walking and/or transfers;A little help with bathing/dressing/bathroom;Assistance with cooking/housework;Assist for transportation;Help with stairs or ramp for entrance    Equipment Recommendations None recommended by PT  Recommendations for Other Services       Functional Status Assessment Patient has had a recent decline in their functional status and demonstrates the ability to make significant improvements in function in a reasonable and predictable amount of time.     Precautions / Restrictions  Precautions Precautions: Fall;Back Precaution Booklet Issued: Yes (comment) Precaution Comments: Reviewed handout and pt was cued for precautions during functional mobility. Required Braces or Orthoses: Spinal Brace Spinal Brace: Lumbar corset;Applied in sitting position Restrictions Weight Bearing Restrictions: No      Mobility  Bed Mobility Overal bed mobility: Needs Assistance Bed Mobility: Rolling, Sidelying to Sit, Sit to Sidelying Rolling: Min guard Sidelying to sit: Min guard     Sit to sidelying: Mod assist General bed mobility comments: Assist to elevate LE's back up into bed at end of session. VC's throughout for log roll technique.    Transfers Overall transfer level: Needs assistance Equipment used: Rolling walker (2 wheels) Transfers: Sit to/from Stand Sit to Stand: Min assist           General transfer comment: Assist to power up to full stand. Increased time to gain/maintain standing balance.    Ambulation/Gait Ambulation/Gait assistance: Min assist, +2 safety/equipment Gait Distance (Feet): 50 Feet Assistive device: Rolling walker (2 wheels) Gait Pattern/deviations: Step-through pattern, Decreased stride length, Drifts right/left, Trunk flexed Gait velocity: Decreased Gait velocity interpretation: <1.31 ft/sec, indicative of household ambulator   General Gait Details: Slow and guarded with poor posture. Pt moving quickly at times - appears to have poor control of gait speed. Seated rest break after ~50' and motivated to ambulate back to the room.  Stairs            Wheelchair Mobility    Modified Rankin (Stroke Patients Only)       Balance Overall balance assessment: Needs assistance Sitting-balance support: Feet supported, No upper extremity supported Sitting balance-Leahy Scale: Fair     Standing balance support: Bilateral upper extremity supported, During functional activity, Reliant on assistive  device for balance Standing  balance-Leahy Scale: Poor                               Pertinent Vitals/Pain Pain Assessment Pain Assessment: Faces Faces Pain Scale: Hurts even more Pain Location: Abdominal incision site Pain Descriptors / Indicators: Operative site guarding, Sore Pain Intervention(s): Limited activity within patient's tolerance, Monitored during session, Repositioned    Home Living Family/patient expects to be discharged to:: Private residence Living Arrangements: Alone Available Help at Discharge: Family;Available 24 hours/day (Brother) Type of Home: Mobile home Home Access: Ramped entrance       Home Layout: One level Home Equipment: Wheelchair - manual;Cane - single Barista (2 wheels);Shower seat;Electric scooter      Prior Function Prior Level of Function : Needs assist;History of Falls (last six months)             Mobility Comments: Reports he has been using a Hover Round scooter and uses the walker for transfers. States he has not been walking distances throughout the house. ADLs Comments: Pt reports he has not been in the shower to wash in about a month. Has been washing off on the shower seat by the sink.     Hand Dominance   Dominant Hand: Right    Extremity/Trunk Assessment   Upper Extremity Assessment Upper Extremity Assessment: Defer to OT evaluation    Lower Extremity Assessment Lower Extremity Assessment: Generalized weakness (Consistent with pre-op diagnosis)    Cervical / Trunk Assessment Cervical / Trunk Assessment: Back Surgery  Communication   Communication: No difficulties  Cognition Arousal/Alertness: Awake/alert Behavior During Therapy: WFL for tasks assessed/performed Overall Cognitive Status: Within Functional Limits for tasks assessed                                          General Comments      Exercises     Assessment/Plan    PT Assessment Patient needs continued PT services  PT Problem List  Decreased strength;Decreased activity tolerance;Decreased balance;Decreased mobility;Decreased knowledge of use of DME;Decreased safety awareness;Decreased knowledge of precautions;Pain       PT Treatment Interventions DME instruction;Gait training;Functional mobility training;Therapeutic activities;Therapeutic exercise;Balance training;Patient/family education    PT Goals (Current goals can be found in the Care Plan section)  Acute Rehab PT Goals Patient Stated Goal: Be able to get in the shower PT Goal Formulation: With patient/family Time For Goal Achievement: 08/02/22 Potential to Achieve Goals: Good    Frequency Min 5X/week     Co-evaluation               AM-PAC PT "6 Clicks" Mobility  Outcome Measure Help needed turning from your back to your side while in a flat bed without using bedrails?: A Little Help needed moving from lying on your back to sitting on the side of a flat bed without using bedrails?: A Little Help needed moving to and from a bed to a chair (including a wheelchair)?: A Little Help needed standing up from a chair using your arms (e.g., wheelchair or bedside chair)?: A Little Help needed to walk in hospital room?: A Little Help needed climbing 3-5 steps with a railing? : A Lot 6 Click Score: 17    End of Session Equipment Utilized During Treatment: Gait belt;Back brace Activity Tolerance: Patient tolerated treatment well Patient left:  in bed;with call bell/phone within reach;with family/visitor present Nurse Communication: Mobility status PT Visit Diagnosis: Unsteadiness on feet (R26.81);Pain Pain - part of body:  (abdomen, thighs)    Time: 3735-7897 PT Time Calculation (min) (ACUTE ONLY): 19 min   Charges:   PT Evaluation $PT Eval Moderate Complexity: 1 Mod          Francisco Collier, PT, DPT Acute Rehabilitation Services Secure Chat Preferred Office: [phone]   Thelma Comp 07/26/2022, 1:17 PM

## 2022-07-26 NOTE — Anesthesia Preprocedure Evaluation (Addendum)
Anesthesia Evaluation  Patient identified by MRN, date of birth, ID band Patient awake    Reviewed: Allergy & Precautions, H&P , NPO status , Patient's Chart, lab work & pertinent test results, reviewed documented beta blocker date and time   Airway Mallampati: II  TM Distance: >3 FB Neck ROM: Full    Dental no notable dental hx. (+) Edentulous Upper, Edentulous Lower, Dental Advisory Given   Pulmonary COPD,  COPD inhaler, former smoker,    Pulmonary exam normal breath sounds clear to auscultation       Cardiovascular hypertension, Pt. on medications and Pt. on home beta blockers + CAD, + Past MI and + Cardiac Stents   Rhythm:Regular Rate:Normal     Neuro/Psych  Headaches, negative psych ROS   GI/Hepatic Neg liver ROS, PUD, GERD  Medicated,  Endo/Other  negative endocrine ROS  Renal/GU negative Renal ROS  negative genitourinary   Musculoskeletal  (+) Arthritis , Osteoarthritis,    Abdominal   Peds  Hematology negative hematology ROS (+)   Anesthesia Other Findings   Reproductive/Obstetrics negative OB ROS                            Anesthesia Physical Anesthesia Plan  ASA: 3  Anesthesia Plan: General   Post-op Pain Management: Tylenol PO (pre-op)*   Induction: Intravenous  PONV Risk Score and Plan: 3 and Ondansetron, Dexamethasone and Midazolam  Airway Management Planned: Oral ETT  Additional Equipment: Arterial line  Intra-op Plan:   Post-operative Plan: Extubation in OR  Informed Consent: I have reviewed the patients History and Physical, chart, labs and discussed the procedure including the risks, benefits and alternatives for the proposed anesthesia with the patient or authorized representative who has indicated his/her understanding and acceptance.     Dental advisory given  Plan Discussed with: CRNA  Anesthesia Plan Comments:         Anesthesia Quick  Evaluation

## 2022-07-26 NOTE — Transfer of Care (Signed)
Immediate Anesthesia Transfer of Care Note  Patient: Francisco Collier  Procedure(s) Performed: POSTERIOR LUMBAR FUSION 2 LEVEL LUMBAR [place] FIVE  Patient Location: PACU  Anesthesia Type:General  Level of Consciousness: awake, alert, oriented  Airway & Oxygen Therapy: Patient Spontanous Breathing  Post-op Assessment: Report given to RN and Post -op Vital signs reviewed and stable  Post vital signs: Reviewed and stable  Last Vitals:  Vitals Value Taken Time  BP 95/50 07/26/22 1504  Temp    Pulse 99 07/26/22 1507  Resp 24 07/26/22 1507  SpO2 93 % 07/26/22 1507  Vitals shown include unvalidated device data.  Last Pain:  Vitals:   07/26/22 1015  TempSrc: Oral  PainSc: 4          Complications: No notable events documented.

## 2022-07-26 NOTE — Brief Op Note (Signed)
07/26/2022  3:05 PM  PATIENT:  Francisco Collier  81 y.o. 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

## 2022-07-26 NOTE — Op Note (Signed)
OPERATIVE REPORT  DATE OF SURGERY: 07/26/2022  PATIENT NAME:  Francisco Collier MRN: [id number] DOB: 1941/06/21  PCP: Ferd Hibbs, NP  PRE-OPERATIVE DIAGNOSIS:S/P OLIF L3-5 for degenerative foraminal stenosis with radiculopathy  POST-OPERATIVE DIAGNOSIS: Same  PROCEDURE:   Posterior supplemental pedicle screw fixation L3-5  SURGEON:  Melina Schools, MD  PHYSICIAN ASSISTANT: None  ANESTHESIA:   General  EBL: 25 ml   Complications: None  Implants: NuVasive MIS pedicle screws.  6.5 x 50 mm length screws.  75 mm length rod on the right and 80 mm length rod on the left.    Monitoring: No adverse EMG or SSEP activity throughout the case.  All 6 pedicle screws were directly stimulated and there was no adverse activity greater than 40 mA.  BRIEF HISTORY: Francisco Collier is a 81 y.o. male who had significant back and radicular leg pain for the last 4 months.  He is essentially been wheelchair-bound due to the nerve pain and neurological deficits.  Yesterday he underwent a oblique lumbar interbody fusion L3-5 and he presents today for posterior supplemental fixation.  Patient noted prior to surgery that his neuropathic leg pain had significantly improved and he was actually ambulating in the hallway.  With the improvement in his neurological exam I elected to move forward with just a supplemental posterior fixation as I did not feel as though the revision decompression was going to be needed.  I explained this to the patient he understood and we move forward with surgery after the consent was signed.  PROCEDURE DETAILS: Patient was brought into the operating room and was properly positioned on the operating room table.  After induction with general anesthesia the patient was endotracheally intubated.  A timeout was taken to confirm all important data: including patient, procedure, and the level. Teds, SCD's were applied.   The patient was turned prone onto the [facility] spine frame and all  bony prominences were well-padded.  The back was then prepped and draped in a standard fashion.  Using fluoroscopy identified the L3, L4, and L5 pedicles and marked out there position on the skin.  I then marked out the [place] incision and connected them and infiltrated this with quarter percent Marcaine with epinephrine.  A small incision was made at the L3 level and a Jamshidi needle was advanced percutaneously down to the lateral aspect of the L3 pedicle I confirmed its position with AP fluoroscopy and then advanced the Jamshidi needle while directly stimulating it into the L3 pedicle.  As I neared the medial wall of the pedicle I switched to the lateral view to confirm that I was just beyond the posterior wall the vertebral body.  Once I confirmed the trajectory I advanced into the L3 vertebral body.  The guidepin was then placed to cannulate the pedicle.  Using this exact same technique I cannulated the L4 and L5 pedicle on the ipsilateral side and then the L3-5 pedicles on the contralateral side.  Once all 6 pedicles were cannulated I then proceeded with the screw placement.  I measured over the guidepin and then obtained the 6.5 x 50 mm length screw.  The screw was then advanced over the guidepin while directly stimulating it.  There were no abnormal free running EMG noted during the insertion of the screw.  Once the screw was properly positioned I removed the guidepin.  I repeated this at each of the remaining pedicles.  Once all 6 of the pedicle screws were properly position I then directly  stimulated each of the pedicles and there was no adverse activity at greater than 40 mA.  Imaging also demonstrated satisfactory position of the pedicle screws.  I then measured and slid the appropriate size rod percutaneously through each of the pedicle screws.  I then placed a locking caps.  I then locked the locking caps with the torque wrench according manufacture standards.  Once all 6 screws were locked in place  I broke off the insertion tabs and remove them.  All 4 incisions that were used to place the percutaneous screws were irrigated copiously normal saline and the deep fascia of each wound was closed with interrupted #1 Vicryl sutures then I placed 2-0 interrupted Vicryl sutures for the subcutaneous tissue and a running 3-0 Monocryl for the skin.  Steri-Strips and a dry dressing were applied and the patient was ultimately extubated transferred to PACU without incident.  The end of the case all needle and sponge counts were correct.  There were no adverse intraoperative events.  Melina Schools, MD 07/26/2022 2:58 PM

## 2022-07-26 NOTE — Anesthesia Procedure Notes (Addendum)
Procedure Name: Intubation Date/Time: 07/26/2022 12:52 PM  Performed by: Leonor Liv, CRNAPre-anesthesia Checklist: Patient identified, Emergency Drugs available, Suction available and Patient being monitored Patient Re-evaluated:Patient Re-evaluated prior to induction Oxygen Delivery Method: Circle System Utilized Preoxygenation: Pre-oxygenation with 100% oxygen Induction Type: IV induction and Rapid sequence Laryngoscope Size: Mac and 4 Grade View: Grade I Tube type: Oral Tube size: 7.5 mm Number of attempts: 1 Airway Equipment and Method: Stylet Placement Confirmation: ETT inserted through vocal cords under direct vision, positive ETCO2 and breath sounds checked- equal and bilateral Secured at: 23 cm Tube secured with: Tape Dental Injury: Teeth and Oropharynx as per pre-operative assessment  Comments: Pt c/o nausea, burpring prior to induction

## 2022-07-26 NOTE — Progress Notes (Signed)
[facility]  Subjective  - complaining of incision pain this morning on left flank.  No N/V.   Objective (!) 144/71 95 98.3 F (36.8 C) (Oral) 16 96%  Intake/Output Summary (Last 24 hours) at 07/26/2022 0800 Last data filed at 07/26/2022 0700 Gross per 24 hour  Intake 3292.15 ml  Output 2150 ml  Net 1142.15 ml    Abdomen with appropriate postop incisional tenderness Left DP palpable  Laboratory Lab Results: No results for input(s): "WBC", "HGB", "HCT", "PLT" in the last 72 hours. BMET No results for input(s): "NA", "K", "CL", "CO2", "GLUCOSE", "BUN", "CREATININE", "CALCIUM" in the last 72 hours.  COAG Lab Results  Component Value Date   INR 1.0 02/13/2022   INR 1.0 12/28/2020   INR 1.08 11/12/2015   No results found for: "PTT"  Assessment/Planning:  Postop day 1 status post oblique spine exposure at L3-L4 and L4-L5 for two-level OLIF.  Apparently had some issues with the Foley overnight.  This morning only complaining of incisional pain on the left flank.  No nausea or vomiting.  Palpable pulse in the left foot.  Looks good from my standpoint.  Plan to go back to the OR with Dr. Rolena Infante today for posterior screws.  Marty Heck 07/26/2022 8:00 AM --

## 2022-07-26 NOTE — Progress Notes (Signed)
OT Cancellation Note  Patient Details Name: INDIO SANTILLI MRN: [id number] DOB: 02/22/1941   Cancelled Treatment:    Reason Eval/Treat Not Completed: Other (comment) (RN reporting pt going back to OR this PM, asking to hold OT eval until tomorrow. Will follow up when appropriate.)  Lynnda Child, OTD, OTR/L Acute Rehab [phone]) [phone] [place] 07/26/2022, 8:17 AM

## 2022-07-26 NOTE — Anesthesia Procedure Notes (Signed)
Arterial Line Insertion Start/End8/15/2023 10:45 AM, 07/26/2022 11:00 AM Performed by: Leonor Liv, CRNA, CRNA  Patient location: Pre-op. Preanesthetic checklist: patient identified, IV checked, site marked, risks and benefits discussed, surgical consent, monitors and equipment checked, pre-op evaluation, timeout performed and anesthesia consent Right, radial was placed Catheter size: 20 G Hand hygiene performed  and maximum sterile barriers used  Allen's test indicative of satisfactory collateral circulation Attempts: 1 Procedure performed without using ultrasound guided technique. Following insertion, dressing applied and Biopatch. Post procedure assessment: normal  Patient tolerated the procedure well with no immediate complications. Additional procedure comments: Bruising at baseline from previous surgery arterial line which was removed postop 8/14.

## 2022-07-26 NOTE — Progress Notes (Signed)
    Subjective: Procedure(s) (LRB): OBLIQUE LUMBAR INTERBODY FUSION LUMBAR THREE TO FIVE (N/A) ABDOMINAL EXPOSURE FOR ANTERIOR LUMBAR SPINE SURGERY (N/A) 1 Day Post-Op  Patient reports pain as 3 on 0-10 scale.  Reports decreased leg pain reports incisional back pain   N/A void - foley in place Negative bowel movement Positive flatus Negative chest pain or shortness of breath  Objective: Vital signs in last 24 hours: Temp:  [97.7 F (36.5 C)-99.4 F (37.4 C)] 98.3 F (36.8 C) (08/15 0722) Pulse Rate:  [63-108] 95 (08/15 0722) Resp:  [13-20] 16 (08/15 0722) BP: (88-146)/(44-91) 144/71 (08/15 0722) SpO2:  [93 %-99 %] 96 % (08/15 0722) Arterial Line BP: (110-137)/(47-58) 137/58 (08/14 1345)  Intake/Output from previous day: 08/14 0701 - 08/15 0700 In: 3292.2 [P.O.:360; I.V.:2832.2; IV Piggyback:100] Out: 2150 [Urine:1950; Blood:200]  Labs: No results for input(s): "WBC", "RBC", "HCT", "PLT" in the last 72 hours. No results for input(s): "NA", "K", "CL", "CO2", "BUN", "CREATININE", "GLUCOSE", "CALCIUM" in the last 72 hours. No results for input(s): "LABPT", "INR" in the last 72 hours.  Physical Exam: ABD soft Intact pulses distally Incision: dressing C/D/I and no drainage Compartment soft Patient reports decreased dysesthesias and neuropathic pain into the right lower extremity.  Generalized lower extremity weakness persists but overall it is improved.  Patient's primary complaint is incisional pain in the left lower abdomen.   Assessment/Plan: Patient stable  xrays not applicable Patient reports improvement in neuropathic pain. Given the fact that the neuropathic pain is improved I have recommended moving forward with the supplemental posterior fixation.  I do not think additional decompression is required. I have discussed this with the patient and his he is in agreement with the plan.  He will remain n.p.o. for now, and we will ambulate with therapy this  morning.  Melina Schools, MD [facility] [phone]

## 2022-07-27 ENCOUNTER — Encounter (HOSPITAL_COMMUNITY): Payer: Self-pay | Admitting: Orthopedic Surgery

## 2022-07-27 MED ORDER — SORBITOL 70 % SOLN
30.0000 mL | Freq: Every day | Status: DC | PRN
  Administered 2022-07-27: 30 mL via ORAL
  Filled 2022-07-27: qty 30

## 2022-07-27 MED FILL — Heparin Sodium (Porcine) Inj 1000 Unit/ML: INTRAMUSCULAR | Qty: 10 | Status: AC

## 2022-07-27 MED FILL — Sodium Chloride IV Soln 0.9%: INTRAVENOUS | Qty: 1000 | Status: AC

## 2022-07-27 NOTE — TOC Initial Note (Signed)
Transition of Care [facility]) - Initial/Assessment Note    Patient Details  Name: Francisco Collier MRN: [id number] Date of Birth: 1941/06/26  Transition of [facility]) CM/SW Contact:    Pollie Friar, RN Phone Number: 07/27/2022, 4:17 PM  Clinical Narrative:                 Pt is from home alone. Pts brother is going to assist after discharge.  Pt with orders for home health services. Pt interested in using [facility]. Information on the AVS.  Pt states he has all DME needed.  Pt has transport home at discharge.   Expected Discharge Plan: [place] Barriers to Discharge: Continued Medical Work up   Patient Goals and CMS Choice   CMS Medicare.gov Compare Post Acute Care list provided to:: Patient Choice offered to / list presented to : Patient  Expected Discharge Plan and Services Expected Discharge Plan: [place]   Discharge Planning Services: CM Consult Post Acute Care Choice: [place] arrangements for the past 2 months: Single Family Home                           HH Arranged: PT, OT, Nurse's Aide HH Agency: [place] Date [place]: 07/27/22   Representative spoke with at [place]: Claiborne Billings  Prior Living Arrangements/Services Living arrangements for the past 2 months: [place] Lives with:: Self Patient language and need for interpreter reviewed:: Yes Do you feel safe going back to the place where you live?: Yes      Need for Family Participation in Patient Care: No (Comment) Care giver support system in place?: Yes (comment) Current home services: DME (All needed per patient) Criminal Activity/Legal Involvement Pertinent to Current Situation/Hospitalization: No - Comment as needed  Activities of Daily Living      Permission Sought/Granted                  Emotional Assessment Appearance:: Appears stated age Attitude/Demeanor/Rapport: Engaged Affect (typically  observed): Accepting Orientation: : Oriented to Self, Oriented to Place, Oriented to  Time, Oriented to Situation   Psych Involvement: No (comment)  Admission diagnosis:  S/P lumbar fusion [Z98.1] Patient Active Problem List   Diagnosis Date Noted   S/P lumbar fusion 07/25/2022   Chronic back pain 06/21/2022   Low back pain 05/17/2022   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 ([place]) 02/13/2022   Acute severe vertigo 01/18/2021   History of CVA (cerebrovascular accident) 01/18/2021   Hypertension    Stroke ([place]) 12/28/2020   Food impaction of esophagus    Esophageal stricture    Unstable angina ([place]) 11/11/2015   Snoring 02/13/2015   Excessive daytime sleepiness 02/13/2015   Morning headache 02/13/2015   Frequent nocturnal awakening 02/13/2015   Nocturnal headaches 02/13/2015   New daily persistent headache 02/13/2015   Obesity 02/13/2015   Diverticulosis 07/29/2011   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

## 2022-07-27 NOTE — Evaluation (Signed)
Occupational Therapy Evaluation Patient Details Name: Francisco Collier MRN: [id number] DOB: 12/26/1940 Today's Date: 07/27/2022   History of Present Illness Pt is a 81 y/o male who presents s/p L3-L5 OLIF on 07/25/2022. He returns to the OR 07/26/2022 for second stage of surgery. PMH significant for CAD, COPD, diverticulitis, esophageal stricture, HTN, MI, prostatic hypertrophy, vertigo.   Clinical Impression   PTA, pt lived alone and was mod I for ADL using hover round scooter within the home and taking sponge baths. His brother helped with cleaning in the home. Currently, pt performing functional transfers with min A and functional mobility with min guard A. Pt performing shower transfer with min guard and LB dressing with min A for sit<>stand. Pt reporting his brother will be helping him at home. Pt educated and demonstrating use of compensatory techniques for dressing, bathing, oral care, bed mobility, and shower transfer within precautions. Recommend discharge home with no OT follow up.      Recommendations for follow up therapy are one component of a multi-disciplinary discharge planning process, led by the attending physician.  Recommendations may be updated based on patient status, additional functional criteria and insurance authorization.   Follow Up Recommendations  Home health OT    Assistance Recommended at Discharge Frequent or constant Supervision/Assistance  Patient can return home with the following A little help with walking and/or transfers;A little help with bathing/dressing/bathroom;Assistance with cooking/housework;Help with stairs or ramp for entrance;Assist for transportation    Functional Status Assessment  Patient has had a recent decline in their functional status and demonstrates the ability to make significant improvements in function in a reasonable and predictable amount of time.  Equipment Recommendations  None recommended by OT (Pt has all recommended equipment)     Recommendations for Other Services PT consult     Precautions / Restrictions Precautions Precautions: Fall;Back Precaution Booklet Issued: Yes (comment) Precaution Comments: All education reviewed Required Braces or Orthoses: Spinal Brace Spinal Brace: Lumbar corset;Applied in sitting position Restrictions Weight Bearing Restrictions: No      Mobility Bed Mobility Overal bed mobility: Needs Assistance Bed Mobility: Rolling, Sidelying to Sit, Sit to Sidelying Rolling: Min guard Sidelying to sit: Min assist     Sit to sidelying: Mod assist General bed mobility comments: Assist to raise trunk due to pain. Assist to elevate BLE back into bed    Transfers Overall transfer level: Needs assistance Equipment used: Rolling walker (2 wheels) Transfers: Sit to/from Stand Sit to Stand: Min assist           General transfer comment: Assist to power up to full stand. Increased time to gain/maintain standing balance. Dizziness on standing, but VSS      Balance Overall balance assessment: Needs assistance Sitting-balance support: Feet supported, No upper extremity supported Sitting balance-Leahy Scale: Fair Sitting balance - Comments: Unable to perform figure 4 position at this time, but using BUE to perform with sock aid sitting EOB   Standing balance support: Bilateral upper extremity supported, During functional activity, Reliant on assistive device for balance Standing balance-Leahy Scale: Poor Standing balance comment: Reliant on RW                           ADL either performed or assessed with clinical judgement   ADL Overall ADL's : Needs assistance/impaired Eating/Feeding: Modified independent;Sitting   Grooming: Min guard;Standing Grooming Details (indicate cue type and reason): Educated regarding oral care. Pt reporting no teeth and occasionally uses  listerine Upper Body Bathing: Supervision/ safety;Set up;Sitting   Lower Body Bathing: Minimal  assistance;Sit to/from stand Lower Body Bathing Details (indicate cue type and reason): Min A for rise Upper Body Dressing : Set up;Supervision/safety;Sitting Upper Body Dressing Details (indicate cue type and reason): Pt reporting he can don brace, and verbalizing steps during session Lower Body Dressing: Minimal assistance;Sit to/from stand Lower Body Dressing Details (indicate cue type and reason): Educated on use of compensatory techniques. Using sock aid with supervision. Min A to rise for LB dressing Toilet Transfer: Minimal assistance;Comfort height toilet;Rolling walker (2 wheels);Ambulation Toilet Transfer Details (indicate cue type and reason): Simulated in room     Tub/ Shower Transfer: Walk-in shower;Min guard;Rolling walker (2 wheels);Shower Scientist, research (medical) Details (indicate cue type and reason): Min guard A for safety. Cues for new learning Functional mobility during ADLs: Min guard;Rolling walker (2 wheels) General ADL Comments: Limited by pain and decreased BLE strength     Vision Baseline Vision/History: 1 Wears glasses Patient Visual Report: No change from baseline Vision Assessment?: No apparent visual deficits     Perception     Praxis      Pertinent Vitals/Pain Pain Assessment Pain Assessment: 0-10 Pain Score: 10-Worst pain ever Pain Location: Abdominal incision site; L anterior hip below incision site. Pt reporting pain is a 10 but agreeable to therapy Pain Descriptors / Indicators: Operative site guarding, Sore Pain Intervention(s): Limited activity within patient's tolerance, Monitored during session, Repositioned, Relaxation     Hand Dominance Right   Extremity/Trunk Assessment Upper Extremity Assessment Upper Extremity Assessment: Generalized weakness   Lower Extremity Assessment Lower Extremity Assessment: Defer to PT evaluation   Cervical / Trunk Assessment Cervical / Trunk Assessment: Back Surgery   Communication  Communication Communication: No difficulties   Cognition Arousal/Alertness: Awake/alert Behavior During Therapy: WFL for tasks assessed/performed Overall Cognitive Status: Within Functional Limits for tasks assessed                                 General Comments: Recalling 2/3 spinal precautions; recalling thourghout session following initial education. Up to min cues to maintain     General Comments  VSS. Pt reporting has a large support system, brother to stay with him for a few days and nurse who comes out to assist. Anticipate pt will recover well as pain continues to be managed.    Exercises     Shoulder Instructions      Home Living Family/patient expects to be discharged to:: Private residence Living Arrangements: Alone Available Help at Discharge: Family;Friend(s);Neighbor;Available 24 hours/day (Brother, neighbor, and church members available) Type of Home: Mobile home Home Access: Ramped entrance     [place]: One level     Bathroom Shower/Tub: Tub/shower unit;Walk-in shower   Bathroom Toilet: Handicapped height Bathroom Accessibility: Yes How Accessible: Accessible via walker Home Equipment: Wheelchair - manual;Cane - single point;Rolling Walker (2 wheels);Shower seat;Electric scooter          Prior Functioning/Environment Prior Level of Function : Needs assist;History of Falls (last six months)             Mobility Comments: Reports he has been using a Hover Round scooter and uses the walker for transfers. States he has not been walking distances throughout the house. ADLs Comments: Pt reports he has not been in the shower to wash in about a month. Has been washing off on the shower seat by the sink.  OT Problem List: Decreased strength;Decreased range of motion;Impaired balance (sitting and/or standing);Decreased activity tolerance;Decreased knowledge of precautions;Decreased knowledge of use of DME or AE;Pain      OT  Treatment/Interventions: Self-care/ADL training;Therapeutic exercise;DME and/or AE instruction;Therapeutic activities;Patient/family education;Balance training    OT Goals(Current goals can be found in the care plan section) Acute Rehab OT Goals Patient Stated Goal: No pain OT Goal Formulation: With patient  OT Frequency: Min 2X/week    Co-evaluation              AM-PAC OT "6 Clicks" Daily Activity     Outcome Measure Help from another person eating meals?: None Help from another person taking care of personal grooming?: A Little Help from another person toileting, which includes using toliet, bedpan, or urinal?: A Little Help from another person bathing (including washing, rinsing, drying)?: A Little Help from another person to put on and taking off regular upper body clothing?: A Little Help from another person to put on and taking off regular lower body clothing?: A Little 6 Click Score: 19   End of Session Equipment Utilized During Treatment: Gait belt;Rolling walker (2 wheels) Nurse Communication: Mobility status  Activity Tolerance: Patient tolerated treatment well Patient left: in bed;with call bell/phone within reach  OT Visit Diagnosis: Unsteadiness on feet (R26.81);Muscle weakness (generalized) (M62.81);History of falling (Z91.81);Pain Pain - part of body:  (operative sites)                Time: 5053-9767 OT Time Calculation (min): 32 min Charges:  OT General Charges $OT Visit: 1 Visit OT Evaluation $OT Eval Low Complexity: 1 Low OT Treatments $Self Care/Home Management : 8-22 mins  Shanda Howells, OTR/L [facility] Acute Rehabilitation Office: [phone]   Lula Olszewski 07/27/2022, 11:22 AM

## 2022-07-27 NOTE — Anesthesia Postprocedure Evaluation (Signed)
Anesthesia Post Note  Patient: Francisco Collier  Procedure(s) Performed: POSTERIOR LUMBAR FUSION 2 LEVEL LUMBAR [place] FIVE     Patient location during evaluation: Other Anesthesia Type: General Level of consciousness: awake and alert Pain management: pain level controlled Vital Signs Assessment: post-procedure vital signs reviewed and stable Respiratory status: spontaneous breathing, nonlabored ventilation and respiratory function stable Cardiovascular status: blood pressure returned to baseline and stable Postop Assessment: no apparent nausea or vomiting Anesthetic complications: no   No notable events documented.  Last Vitals:  Vitals:   07/27/22 0350 07/27/22 0747  BP: (!) 125/57 128/66  Pulse: 89 93  Resp: 18 20  Temp: 37 C 37 C  SpO2: 90% 99%    Last Pain:  Vitals:   07/27/22 0747  TempSrc: Oral  PainSc:                  Apolonio Cutting,W. EDMOND

## 2022-07-27 NOTE — Progress Notes (Signed)
    Subjective: Procedure(s) (LRB): POSTERIOR LUMBAR FUSION 2 LEVEL LUMBAR THREE-LUMBAR FIVE (N/A) 1 Day Post-Op  Patient reports pain as 3 on 0-10 scale.  Reports decreased leg pain reports incisional back pain   Positive void Negative bowel movement Negative flatus Negative chest pain or shortness of breath  Objective: Vital signs in last 24 hours: Temp:  [98.2 F (36.8 C)-99.4 F (37.4 C)] 98.6 F (37 C) (08/16 0747) Pulse Rate:  [89-102] 93 (08/16 0747) Resp:  [16-22] 20 (08/16 0747) BP: (95-138)/(50-93) 128/66 (08/16 0747) SpO2:  [90 %-99 %] 99 % (08/16 0747) Arterial Line BP: (136-172)/(53-65) 172/65 (08/15 1615)  Intake/Output from previous day: 08/15 0701 - 08/16 0700 In: 1751.5 [P.O.:480; I.V.:1221.5; IV Piggyback:50] Out: 2100 [Urine:2075; Blood:25]  Labs: No results for input(s): "WBC", "RBC", "HCT", "PLT" in the last 72 hours. No results for input(s): "NA", "K", "CL", "CO2", "BUN", "CREATININE", "GLUCOSE", "CALCIUM" in the last 72 hours. No results for input(s): "LABPT", "INR" in the last 72 hours.  Physical Exam: ABD soft Intact pulses distally Incision: dressing C/D/I Compartment soft Body mass index is 28.37 kg/m.   Assessment/Plan: Patient stable  xrays n/a Continue mobilization with physical therapy Continue care  Overall patient is stable.  Status post two-level anterior posterior fusion.  Today we will focus on ambulation as well as his constipation. Possible discharge to home Friday versus a skilled nursing facility.  Melina Schools, MD [facility] [phone]

## 2022-07-28 NOTE — Progress Notes (Signed)
Physical Therapy Treatment Patient Details Name: Francisco Collier MRN: [id number] DOB: 24-Aug-1941 Today's Date: 07/28/2022   History of Present Illness Pt is a 81 y/o male who presents s/p L3-L5 OLIF on 07/25/2022. He returns to the OR 07/26/2022 for posterior supplemental pedicle screw fixation L3-5. PMH significant for CAD, COPD, diverticulitis, esophageal stricture, HTN, MI, prostatic hypertrophy, vertigo.    PT Comments    Pt progressing towards physical therapy goals. Was able to perform transfers and ambulation with gross min guard assist to min assist and RW for support. Reinforced education regarding activity progression, maintenance of precautions, and general safety with mobility at home. Pt anticipates d/c home this afternoon. Will continue to follow and progress as able per POC.     Recommendations for follow up therapy are one component of a multi-disciplinary discharge planning process, led by the attending physician.  Recommendations may be updated based on patient status, additional functional criteria and insurance authorization.  Follow Up Recommendations  Home health PT     Assistance Recommended at Discharge Intermittent Supervision/Assistance  Patient can return home with the following A little help with walking and/or transfers;A little help with bathing/dressing/bathroom;Assistance with cooking/housework;Assist for transportation;Help with stairs or ramp for entrance   Equipment Recommendations  None recommended by PT    Recommendations for Other Services       Precautions / Restrictions Precautions Precautions: Fall;Back Precaution Booklet Issued: Yes (comment) Precaution Comments: Reviewed education with pt brother who will be assisting at home, and reinforcing spinal precautions during ADL during session. Required Braces or Orthoses: Spinal Brace Spinal Brace: Lumbar corset;Applied in sitting position Restrictions Weight Bearing Restrictions: No      Mobility  Bed Mobility Overal bed mobility: Needs Assistance Bed Mobility: Rolling, Sit to Sidelying Rolling: Min assist Sidelying to sit: Min assist       General bed mobility comments: HOB flat and rails lowered to simulate home environment. Pt required assist for full roll and to get LUE under him to push up to full sitting position.    Transfers Overall transfer level: Needs assistance Equipment used: Rolling walker (2 wheels) Transfers: Sit to/from Stand Sit to Stand: Min assist           General transfer comment: Assist to hold walker and keep it from tilting back on him during power up. VC's for hand placement on seated surface for safety, however pt still pulling up with 1 hand from walker.    Ambulation/Gait Ambulation/Gait assistance: Min guard Gait Distance (Feet): 100 Feet Assistive device: Rolling walker (2 wheels) Gait Pattern/deviations: Step-through pattern, Decreased stride length, Drifts right/left, Trunk flexed Gait velocity: Decreased Gait velocity interpretation: <1.31 ft/sec, indicative of household ambulator   General Gait Details: Pt moving quickly at times, appearing unsteady, with flexed trunk over walker. Able to make corrective changes with cues but unable to maintain. Reports numbness in RLE throughout gait training.   Stairs             Wheelchair Mobility    Modified Rankin (Stroke Patients Only)       Balance Overall balance assessment: Needs assistance Sitting-balance support: Feet supported, No upper extremity supported Sitting balance-Leahy Scale: Fair Sitting balance - Comments: Using BUE to achieve fully upright sitting on edge of recliner   Standing balance support: Bilateral upper extremity supported, During functional activity, Reliant on assistive device for balance Standing balance-Leahy Scale: Poor Standing balance comment: Reliant on RW  Cognition Arousal/Alertness:  Awake/alert Behavior During Therapy: WFL for tasks assessed/performed Overall Cognitive Status: Within Functional Limits for tasks assessed                                          Exercises      General Comments        Pertinent Vitals/Pain Pain Assessment Pain Assessment: Faces Faces Pain Scale: Hurts little more Pain Location: Operative sites Pain Descriptors / Indicators: Operative site guarding, Sore Pain Intervention(s): Limited activity within patient's tolerance, Monitored during session, Repositioned    Home Living                          Prior Function            PT Goals (current goals can now be found in the care plan section) Acute Rehab PT Goals Patient Stated Goal: Be able to get in the shower PT Goal Formulation: With patient/family Time For Goal Achievement: 08/02/22 Potential to Achieve Goals: Good Progress towards PT goals: Progressing toward goals    Frequency    Min 5X/week      PT Plan Current plan remains appropriate    Co-evaluation              AM-PAC PT "6 Clicks" Mobility   Outcome Measure  Help needed turning from your back to your side while in a flat bed without using bedrails?: A Little Help needed moving from lying on your back to sitting on the side of a flat bed without using bedrails?: A Little Help needed moving to and from a bed to a chair (including a wheelchair)?: A Little Help needed standing up from a chair using your arms (e.g., wheelchair or bedside chair)?: A Little Help needed to walk in hospital room?: A Little Help needed climbing 3-5 steps with a railing? : A Lot 6 Click Score: 17    End of Session Equipment Utilized During Treatment: Gait belt;Back brace Activity Tolerance: Patient tolerated treatment well Patient left: in bed;with call bell/phone within reach;with family/visitor present Nurse Communication: Mobility status PT Visit Diagnosis: Unsteadiness on feet  (R26.81);Pain Pain - part of body:  (abdomen, thighs)     Time: 8850-2774 PT Time Calculation (min) (ACUTE ONLY): 17 min  Charges:  $Gait Training: 8-22 mins                     Rolinda Roan, PT, DPT Acute Rehabilitation Services Secure Chat Preferred Office: [phone]    Thelma Comp 07/28/2022, 1:16 PM

## 2022-07-28 NOTE — Progress Notes (Signed)
    Subjective: Procedure(s) (LRB): POSTERIOR LUMBAR FUSION 2 LEVEL LUMBAR THREE-LUMBAR FIVE (N/A) 2 Days Post-Op  Patient reports pain as 2 on 0-10 scale.  Reports decreased leg pain reports incisional back pain   Positive void Negative bowel movement Positive flatus Negative chest pain or shortness of breath  Objective: Vital signs in last 24 hours: Temp:  [98.1 F (36.7 C)-99.4 F (37.4 C)] 98.4 F (36.9 C) (08/17 1147) Pulse Rate:  [90-99] 99 (08/17 1147) Resp:  [17-20] 17 (08/17 1147) BP: (112-142)/(63-74) 133/72 (08/17 1147) SpO2:  [92 %-98 %] 98 % (08/17 1147)  Intake/Output from previous day: 08/16 0701 - 08/17 0700 In: -  Out: 500 [Urine:500]  Labs: No results for input(s): "WBC", "RBC", "HCT", "PLT" in the last 72 hours. No results for input(s): "NA", "K", "CL", "CO2", "BUN", "CREATININE", "GLUCOSE", "CALCIUM" in the last 72 hours. No results for input(s): "LABPT", "INR" in the last 72 hours.  Physical Exam: Neurologically intact ABD soft Intact pulses distally Incision: dressing C/D/I and no drainage Compartment soft Body mass index is 28.37 kg/m.   Assessment/Plan: Patient stable  xrays n/a Continue mobilization with physical therapy Continue care  Is doing exceptionally well.  He is ambulating down the hallway with assistance.  He has been cleared for discharge from physical therapy standpoint.  Plan on discharge to home today with appropriate medications and instructions.  He will follow-up with me in 2 weeks as ordered.  Melina Schools, MD [facility] [phone]

## 2022-07-28 NOTE — Progress Notes (Signed)
Patient alert and oriented, mae's well, voiding adequate amount of urine, swallowing without difficulty, no c/o pain at time of discharge. Patient discharged home with family. Script and discharged instructions given to patient. Patient and family stated understanding of instructions given. Patient has an appointment with Dr. Brooks  

## 2022-07-28 NOTE — Progress Notes (Signed)
Occupational Therapy Treatment Patient Details Name: Francisco Collier MRN: [id number] DOB: Nov 02, 1941 Today's Date: 07/28/2022   History of present illness Pt is a 81 y/o male who presents s/p L3-L5 OLIF on 07/25/2022. He returns to the OR 07/26/2022 for second stage of surgery. PMH significant for CAD, COPD, diverticulitis, esophageal stricture, HTN, MI, prostatic hypertrophy, vertigo.   OT comments  Pt progressing towards established OT goals and with greater pain management this session. Focus session on functional transfers, bed mobility, and caregiver education. Pt brother serving as caregiver. Pt performing sit<>stand transfers, functional mobility, bed mobility, and shower transfer with min guard A. Educating pt and brother on how caregivers can provide assistance if needed during transfers and bed mobility within precautions. Continue to recommend HHOT to optimize safety and independence in ADL and IADL in the home setting.    Recommendations for follow up therapy are one component of a multi-disciplinary discharge planning process, led by the attending physician.  Recommendations may be updated based on patient status, additional functional criteria and insurance authorization.    Follow Up Recommendations  Home health OT    Assistance Recommended at Discharge Frequent or constant Supervision/Assistance  Patient can return home with the following  A little help with walking and/or transfers;A little help with bathing/dressing/bathroom;Assistance with cooking/housework;Help with stairs or ramp for entrance;Assist for transportation   Equipment Recommendations  None recommended by OT (Pt has all recommended equipment)    Recommendations for Other Services      Precautions / Restrictions Precautions Precautions: Fall;Back Precaution Booklet Issued: Yes (comment) Precaution Comments: Reviewed education with pt brother who will be assisting at home, and reinforcing spinal precautions  during ADL during session. Required Braces or Orthoses: Spinal Brace Spinal Brace: Lumbar corset;Applied in sitting position Restrictions Weight Bearing Restrictions: No       Mobility Bed Mobility Overal bed mobility: Needs Assistance Bed Mobility: Rolling, Sit to Sidelying Rolling: Supervision       Sit to sidelying: Min guard General bed mobility comments: Increased time. 1-2 cues for technique    Transfers Overall transfer level: Needs assistance Equipment used: Rolling walker (2 wheels) Transfers: Sit to/from Stand Sit to Stand: Min guard           General transfer comment: Increased time. Cues for technique and for coming to upright position.     Balance Overall balance assessment: Needs assistance Sitting-balance support: Feet supported, No upper extremity supported Sitting balance-Leahy Scale: Fair Sitting balance - Comments: Using BUE to achieve fully upright sitting on edge of recliner   Standing balance support: Bilateral upper extremity supported, During functional activity, Reliant on assistive device for balance Standing balance-Leahy Scale: Poor Standing balance comment: Reliant on RW                           ADL either performed or assessed with clinical judgement   ADL Overall ADL's : Needs assistance/impaired                 Upper Body Dressing : Set up;Sitting Upper Body Dressing Details (indicate cue type and reason): Doffing brace this session Lower Body Dressing: Min guard;Sit to/from stand           Tub/ Shower Transfer: Walk-in shower;Min guard;Rolling walker (2 wheels);Shower Scientist, research (medical) Details (indicate cue type and reason): Providing education with pt and caregiver. Functional mobility during ADLs: Min guard;Rolling walker (2 wheels) General ADL Comments: Pt not requiring physical assist for  transfers or bed mobility this session. Min guard A. 1-2 cues for technique    Extremity/Trunk Assessment  Upper Extremity Assessment Upper Extremity Assessment: Generalized weakness   Lower Extremity Assessment Lower Extremity Assessment: Defer to PT evaluation        Vision   Vision Assessment?: No apparent visual deficits   Perception Perception Perception: Not tested   Praxis Praxis Praxis: Not tested    Cognition Arousal/Alertness: Awake/alert Behavior During Therapy: WFL for tasks assessed/performed Overall Cognitive Status: Within Functional Limits for tasks assessed                                 General Comments: Recalling 3/3 spinal precautions this session. Recalling therapist from session day prior.        Exercises      Shoulder Instructions       General Comments VSS. Pt brother present who will be assisting at home. Providing education on how to assist pt with transfers, bed mobility, and shower transfer if needed in home setting.    Pertinent Vitals/ Pain       Pain Assessment Pain Assessment: Faces Faces Pain Scale: Hurts little more Pain Location: Operative sites Pain Descriptors / Indicators: Operative site guarding, Sore Pain Intervention(s): Limited activity within patient's tolerance, Monitored during session, Repositioned  Home Living                                          Prior Functioning/Environment              Frequency  Min 2X/week        Progress Toward Goals  OT Goals(current goals can now be found in the care plan section)     Acute Rehab OT Goals Patient Stated Goal: No pain OT Goal Formulation: With patient ADL Goals Pt Will Perform Lower Body Dressing: with modified independence;sit to/from stand Pt Will Transfer to Toilet: with modified independence;ambulating;regular height toilet Additional ADL Goal #1: Pt will perform sit<>stand transfers with mod I Additional ADL Goal #2: Pt will perform bed mobility with mod I.  Plan Discharge plan remains appropriate     Co-evaluation                 AM-PAC OT "6 Clicks" Daily Activity     Outcome Measure   Help from another person eating meals?: None Help from another person taking care of personal grooming?: A Little Help from another person toileting, which includes using toliet, bedpan, or urinal?: A Little Help from another person bathing (including washing, rinsing, drying)?: A Little Help from another person to put on and taking off regular upper body clothing?: A Little Help from another person to put on and taking off regular lower body clothing?: A Little 6 Click Score: 19    End of Session Equipment Utilized During Treatment: Gait belt;Rolling walker (2 wheels)  OT Visit Diagnosis: Unsteadiness on feet (R26.81);Muscle weakness (generalized) (M62.81);History of falling (Z91.81);Pain Pain - part of body:  (operative sites)   Activity Tolerance Patient tolerated treatment well   Patient Left in bed;with call bell/phone within reach;with family/visitor present   Nurse Communication Mobility status        Time: 6222-9798 OT Time Calculation (min): 22 min  Charges: OT General Charges $OT Visit: 1 Visit OT Treatments $Self Care/Home Management : 8-22  mins  Shanda Howells, OTR/L [facility] Acute Rehabilitation Office: [phone]  Lula Olszewski 07/28/2022, 9:54 AM

## 2022-07-28 NOTE — Discharge Summary (Signed)
Patient ID: Francisco Collier MRN: [id number] DOB/AGE: Mar 05, 1941 81 y.o.  Admit date: 07/25/2022 Discharge date: 07/28/2022  Admission Diagnoses:  Principal Problem:   S/P lumbar fusion   Discharge Diagnoses:  Principal Problem:   S/P lumbar fusion  status post Procedure(s): POSTERIOR LUMBAR FUSION 2 LEVEL LUMBAR THREE-LUMBAR FIVE  Past Medical History:  Diagnosis Date   Arthritis    back   CAD (coronary artery disease)    COPD (chronic obstructive pulmonary disease) (HCC)    Diverticulitis    Esophageal stricture    Gastric ulcer    GERD (gastroesophageal reflux disease)    Hyperlipemia    Hypertension    Kidney stone    MI (myocardial infarction) ([place]) 12/13/2003   Prostatic hypertrophy    S/P orchiectomy    Shortness of breath    Vertigo     Surgeries: Procedure(s): POSTERIOR LUMBAR FUSION 2 LEVEL LUMBAR THREE-LUMBAR FIVE on 07/26/2022   Consultants: Treatment Team:  Marty Heck, MD  Discharged Condition: Improved  Hospital Course: Francisco Collier is an 81 y.o. male who was admitted 07/25/2022 for operative treatment of S/P lumbar fusion. Patient failed conservative treatments (please see the history and physical for the specifics) and had severe unremitting pain that affects sleep, daily activities and work/hobbies. After pre-op clearance, the patient was taken to the operating room on 07/26/2022 and underwent  Procedure(s): POSTERIOR LUMBAR FUSION 2 LEVEL LUMBAR THREE-LUMBAR FIVE.    Patient was given perioperative antibiotics:  Anti-infectives (From admission, onward)    Start     Dose/Rate Route Frequency Ordered Stop   07/26/22 2000  ceFAZolin (ANCEF) IVPB 1 g/50 mL premix        1 g 100 mL/hr over 30 Minutes Intravenous Every 8 hours 07/26/22 1616 07/27/22 0424   07/25/22 1430  ceFAZolin (ANCEF) IVPB 1 g/50 mL premix        1 g 100 mL/hr over 30 Minutes Intravenous Every 8 hours 07/25/22 1416 07/25/22 2219   07/25/22 0555  ceFAZolin (ANCEF)  IVPB 2g/100 mL premix        2 g 200 mL/hr over 30 Minutes Intravenous 30 min pre-op 07/25/22 0555 07/25/22 1205        Patient was given sequential compression devices and early ambulation to prevent DVT.   Patient benefited maximally from hospital stay and there were no complications. At the time of discharge, the patient was urinating/moving their bowels without difficulty, tolerating a regular diet, pain is controlled with oral pain medications and they have been cleared by PT/OT.   Recent vital signs: Patient Vitals for the past 24 hrs:  BP Temp Temp src Pulse Resp SpO2  07/28/22 1147 133/72 98.4 F (36.9 C) Oral 99 17 98 %  07/28/22 0810 123/63 98.1 F (36.7 C) Oral 96 19 97 %  07/28/22 0332 (!) 142/69 98.2 F (36.8 C) Oral 93 18 96 %  07/27/22 2320 112/68 99.4 F (37.4 C) Oral 90 20 92 %  07/27/22 1944 119/65 98.7 F (37.1 C) Oral 95 20 93 %  07/27/22 1712 128/74 99 F (37.2 C) Oral 97 18 92 %     Recent laboratory studies: No results for input(s): "WBC", "HGB", "HCT", "PLT", "NA", "K", "CL", "CO2", "BUN", "CREATININE", "GLUCOSE", "INR", "CALCIUM" in the last 72 hours.  Invalid input(s): "PT", "2"   Discharge Medications:   Allergies as of 07/28/2022       Reactions   Codeine Nausea Only   Pill form causes nausea Iv form  does not   Niaspan [niacin Er] Other (See Comments)   Severe flushing   Neurontin [gabapentin] Other (See Comments)   Dizziness Double vision   Statins Rash   Headaches         Medication List     STOP taking these medications    acetaminophen 500 MG tablet Commonly known as: TYLENOL   FISH OIL PO   VITAMIN C PO   VITAMIN D-3 PO   ZINC PO       TAKE these medications    amLODipine 5 MG tablet Commonly known as: NORVASC   atorvastatin 10 MG tablet Commonly known as: LIPITOR Take 10 mg by mouth daily.   Breztri Aerosphere 160-9-4.8 MCG/ACT Aero Generic drug: Budeson-Glycopyrrol-Formoterol Inhale 2 puffs into the  lungs daily as needed (wheezing, shortness of breath).   clopidogrel 75 MG tablet Commonly known as: PLAVIX Take 1 tablet (75 mg total) by mouth daily.   fenofibrate 160 MG tablet Take 160 mg by mouth daily.   fluticasone 50 MCG/ACT nasal spray Commonly known as: FLONASE Place 1 spray into both nostrils 2 (two) times daily as needed for allergies.   lisinopril 10 MG tablet Commonly known as: Prinivil Take 1 tablet (10 mg total) by mouth daily.   meclizine 25 MG tablet Commonly known as: ANTIVERT Take 1 tablet (25 mg total) by mouth 3 (three) times daily as needed for dizziness.   methocarbamol 500 MG tablet Commonly known as: ROBAXIN Take 1 tablet (500 mg total) by mouth every 8 (eight) hours as needed for up to 5 days for muscle spasms.   metoprolol tartrate 25 MG tablet Commonly known as: LOPRESSOR Take 0.5 tablets (12.5 mg total) by mouth 2 (two) times daily. What changed: how much to take   ondansetron 4 MG tablet Commonly known as: Zofran Take 1 tablet (4 mg total) by mouth every 8 (eight) hours as needed for nausea or vomiting. What changed:  medication strength how much to take when to take this   oxyCODONE-acetaminophen 10-325 MG tablet Commonly known as: Percocet Take 1 tablet by mouth every 6 (six) hours as needed for up to 5 days for pain. What changed:  how much to take when to take this   pantoprazole 20 MG tablet Commonly known as: PROTONIX Take 1 tablet (20 mg total) by mouth daily.   pantoprazole 40 MG tablet Commonly known as: PROTONIX Take 40 mg by mouth daily as needed (acid reflux).   VITAMIN B-12 PO Take 1 capsule by mouth daily.        Diagnostic Studies: DG C-Arm 1-60 Min-No Report  Result Date: 07/26/2022 CLINICAL DATA:  Revision of L3 through L5. EXAM: LUMBAR SPINE - COMPLETE 4+ VIEW; DG C-ARM 1-60 MIN-NO REPORT Radiation exposure index: 143.55 mGy. COMPARISON:  MRI of June 10, 2022. FINDINGS: Four intraoperative fluoroscopic  images were obtained of the lower lumbar spine. These demonstrate the patient be status post surgical posterior fusion of L3-4 and L4-5 with bilateral intrapedicular screw placement and interbody fusion. IMPRESSION: Fluoroscopic guidance provided during surgical posterior fusion of L3-4 and L4-5. Electronically Signed   By: Marijo Conception M.D.   On: 07/26/2022 15:00   DG C-Arm 1-60 Min-No Report  Result Date: 07/26/2022 CLINICAL DATA:  Revision of L3 through L5. EXAM: LUMBAR SPINE - COMPLETE 4+ VIEW; DG C-ARM 1-60 MIN-NO REPORT Radiation exposure index: 143.55 mGy. COMPARISON:  MRI of June 10, 2022. FINDINGS: Four intraoperative fluoroscopic images were obtained of the lower lumbar spine.  These demonstrate the patient be status post surgical posterior fusion of L3-4 and L4-5 with bilateral intrapedicular screw placement and interbody fusion. IMPRESSION: Fluoroscopic guidance provided during surgical posterior fusion of L3-4 and L4-5. Electronically Signed   By: Marijo Conception M.D.   On: 07/26/2022 15:00   DG Lumbar Spine Complete  Result Date: 07/26/2022 CLINICAL DATA:  Revision of L3 through L5. EXAM: LUMBAR SPINE - COMPLETE 4+ VIEW; DG C-ARM 1-60 MIN-NO REPORT Radiation exposure index: 143.55 mGy. COMPARISON:  MRI of June 10, 2022. FINDINGS: Four intraoperative fluoroscopic images were obtained of the lower lumbar spine. These demonstrate the patient be status post surgical posterior fusion of L3-4 and L4-5 with bilateral intrapedicular screw placement and interbody fusion. IMPRESSION: Fluoroscopic guidance provided during surgical posterior fusion of L3-4 and L4-5. Electronically Signed   By: Marijo Conception M.D.   On: 07/26/2022 15:00   DG Lumbar Spine 2-3 Views  Result Date: 07/25/2022 CLINICAL DATA:  L3 through L5 fusion. EXAM: LUMBAR SPINE - 2-3 VIEW; DG C-ARM 1-60 MIN-NO REPORT Radiation exposure index: 144.91 mGy. COMPARISON:  MRI of June 10, 2022. FINDINGS: Four intraoperative fluoroscopic  images were obtained of the lower lumbar spine. These images demonstrate the patient be status post interbody fusion of L3-4 and L4-5. IMPRESSION: Fluoroscopic guidance provided during interbody fusion of L3-4 and L4-5. Electronically Signed   By: Marijo Conception M.D.   On: 07/25/2022 13:10   DG C-Arm 1-60 Min-No Report  Result Date: 07/25/2022 CLINICAL DATA:  L3 through L5 fusion. EXAM: LUMBAR SPINE - 2-3 VIEW; DG C-ARM 1-60 MIN-NO REPORT Radiation exposure index: 144.91 mGy. COMPARISON:  MRI of June 10, 2022. FINDINGS: Four intraoperative fluoroscopic images were obtained of the lower lumbar spine. These images demonstrate the patient be status post interbody fusion of L3-4 and L4-5. IMPRESSION: Fluoroscopic guidance provided during interbody fusion of L3-4 and L4-5. Electronically Signed   By: Marijo Conception M.D.   On: 07/25/2022 13:10   DG C-Arm 1-60 Min-No Report  Result Date: 07/25/2022 CLINICAL DATA:  L3 through L5 fusion. EXAM: LUMBAR SPINE - 2-3 VIEW; DG C-ARM 1-60 MIN-NO REPORT Radiation exposure index: 144.91 mGy. COMPARISON:  MRI of June 10, 2022. FINDINGS: Four intraoperative fluoroscopic images were obtained of the lower lumbar spine. These images demonstrate the patient be status post interbody fusion of L3-4 and L4-5. IMPRESSION: Fluoroscopic guidance provided during interbody fusion of L3-4 and L4-5. Electronically Signed   By: Marijo Conception M.D.   On: 07/25/2022 13:10   DG C-Arm 1-60 Min-No Report  Result Date: 07/25/2022 CLINICAL DATA:  L3 through L5 fusion. EXAM: LUMBAR SPINE - 2-3 VIEW; DG C-ARM 1-60 MIN-NO REPORT Radiation exposure index: 144.91 mGy. COMPARISON:  MRI of June 10, 2022. FINDINGS: Four intraoperative fluoroscopic images were obtained of the lower lumbar spine. These images demonstrate the patient be status post interbody fusion of L3-4 and L4-5. IMPRESSION: Fluoroscopic guidance provided during interbody fusion of L3-4 and L4-5. Electronically Signed   By: Marijo Conception M.D.   On: 07/25/2022 13:10   DG C-Arm 1-60 Min-No Report  Result Date: 07/25/2022 CLINICAL DATA:  L3 through L5 fusion. EXAM: LUMBAR SPINE - 2-3 VIEW; DG C-ARM 1-60 MIN-NO REPORT Radiation exposure index: 144.91 mGy. COMPARISON:  MRI of June 10, 2022. FINDINGS: Four intraoperative fluoroscopic images were obtained of the lower lumbar spine. These images demonstrate the patient be status post interbody fusion of L3-4 and L4-5. IMPRESSION: Fluoroscopic guidance provided during interbody fusion  of L3-4 and L4-5. Electronically Signed   By: Marijo Conception M.D.   On: 07/25/2022 13:10   DG OR LOCAL ABDOMEN  Addendum Date: 07/25/2022   ADDENDUM REPORT: 07/25/2022 11:54 ADDENDUM: Disc spacers, surgical clips and skin staples, and evidence of prior suture material are identified. No unexpected radiopaque foreign bodies to suggest retained surgical object. Findings called to the operating room at 11:54 a.m. Electronically Signed   By: Randa Ngo M.D.   On: 07/25/2022 11:54   Result Date: 07/25/2022 CLINICAL DATA:  Lumbar discectomy infusion EXAM: OR LOCAL ABDOMEN COMPARISON:  06/10/2022 FINDINGS: Supine portable frontal view of the lumbar spine was obtained. Postsurgical changes are seen from discectomy at the L3-4 and L4-5 levels, with disc spacer in place. Alignment is grossly anatomic on this single frontal view. IMPRESSION: 1. Postsurgical changes from L3-4 and L4-5 discectomies as above. Electronically Signed: By: Randa Ngo M.D. On: 07/25/2022 11:48    Discharge Instructions     Incentive spirometry RT   Complete by: As directed    Incentive spirometry RT   Complete by: As directed         Follow-up Information     Melina Schools, MD Follow up in 2 week(s).   Specialty: Orthopedic Surgery Why: If symptoms worsen, For wound re-check, For suture removal Contact information: [address] [place] [address] [phone]         [facility] [place]  Follow up.   Specialty: Branch Why: The home health agency will contact you for the first home visit Contact information: [place] [address] [phone]         [facility]als [facility] Wheels Follow up.   Why: Call and see if they will deliver to [facility]. Contact information: [phone]                Discharge Plan:  discharge to home  Disposition:  Clinically is now status post anterior posterior L3-5 instrumented fusion and indirect decompression.  Overall he has done exceptionally well.  For the last 4 months he has been wheelchair-bound due to severe pain and lower extremity weakness.  Over the last 2 days since his surgery he has been ambulating in the hallway with minimal assistance.  His pain is significantly improved and his neurological exam is within normal limits.  At this point time will be discharged home with prescription for Percocet, Robaxin, and Zofran.  I encouraged him to ambulate but do nothing else that is strenuous.  He will follow-up with me in 2 weeks.  All of his questions were addressed.   Signed: Dahlia Bailiff for Dr. Melina Schools [facility] [phone] 07/28/2022, 3:16 PM

## 2023-07-03 ENCOUNTER — Other Ambulatory Visit: Payer: Self-pay

## 2023-07-03 ENCOUNTER — Emergency Department (HOSPITAL_COMMUNITY): Payer: Medicare HMO

## 2023-07-03 ENCOUNTER — Emergency Department (HOSPITAL_COMMUNITY)
Admission: EM | Admit: 2023-07-03 | Discharge: 2023-07-03 | Disposition: A | Discharge status: Home / Self Care | Payer: Medicare HMO | Attending: Emergency Medicine | Admitting: Emergency Medicine

## 2023-07-03 ENCOUNTER — Encounter (HOSPITAL_COMMUNITY): Payer: Self-pay | Admitting: Emergency Medicine

## 2023-07-03 DIAGNOSIS — R519 Headache, unspecified: Secondary | ICD-10-CM | POA: Diagnosis not present

## 2023-07-03 DIAGNOSIS — R0789 Other chest pain: Secondary | ICD-10-CM | POA: Insufficient documentation

## 2023-07-03 DIAGNOSIS — I2510 Atherosclerotic heart disease of native coronary artery without angina pectoris: Secondary | ICD-10-CM | POA: Diagnosis not present

## 2023-07-03 DIAGNOSIS — R42 Dizziness and giddiness: Secondary | ICD-10-CM | POA: Diagnosis not present

## 2023-07-03 DIAGNOSIS — Z951 Presence of aortocoronary bypass graft: Secondary | ICD-10-CM | POA: Insufficient documentation

## 2023-07-03 DIAGNOSIS — J449 Chronic obstructive pulmonary disease, unspecified: Secondary | ICD-10-CM | POA: Diagnosis not present

## 2023-07-03 DIAGNOSIS — R0601 Orthopnea: Secondary | ICD-10-CM | POA: Insufficient documentation

## 2023-07-03 DIAGNOSIS — I10 Essential (primary) hypertension: Secondary | ICD-10-CM | POA: Insufficient documentation

## 2023-07-03 DIAGNOSIS — R110 Nausea: Secondary | ICD-10-CM | POA: Diagnosis not present

## 2023-07-03 LAB — CBC
HCT: 43.2 % (ref 39.0–52.0)
Hemoglobin: 14.2 g/dL (ref 13.0–17.0)
MCH: 29.5 pg (ref 26.0–34.0)
MCHC: 32.9 g/dL (ref 30.0–36.0)
MCV: 89.8 fL (ref 80.0–100.0)
Platelets: 337 10*3/uL (ref 150–400)
RBC: 4.81 MIL/uL (ref 4.22–5.81)
RDW: 13.5 % (ref 11.5–15.5)
WBC: 9.3 10*3/uL (ref 4.0–10.5)
nRBC: 0 % (ref 0.0–0.2)

## 2023-07-03 LAB — HEPATIC FUNCTION PANEL
ALT: 16 U/L (ref 0–44)
AST: 27 U/L (ref 15–41)
Albumin: 3.5 g/dL (ref 3.5–5.0)
Alkaline Phosphatase: 58 U/L (ref 38–126)
Bilirubin, Direct: 0.3 mg/dL — ABNORMAL HIGH (ref 0.0–0.2)
Indirect Bilirubin: 0.5 mg/dL (ref 0.3–0.9)
Total Bilirubin: 0.8 mg/dL (ref 0.3–1.2)
Total Protein: 6.3 g/dL — ABNORMAL LOW (ref 6.5–8.1)

## 2023-07-03 LAB — BASIC METABOLIC PANEL
Anion gap: 16 — ABNORMAL HIGH (ref 5–15)
BUN: 10 mg/dL (ref 8–23)
CO2: 24 mmol/L (ref 22–32)
Calcium: 9.6 mg/dL (ref 8.9–10.3)
Chloride: 97 mmol/L — ABNORMAL LOW (ref 98–111)
Creatinine, Ser: 1.08 mg/dL (ref 0.61–1.24)
GFR, Estimated: >60 mL/min (ref >60)
Glucose, Bld: 137 mg/dL — ABNORMAL HIGH (ref 70–99)
Potassium: 3.9 mmol/L (ref 3.5–5.1)
Sodium: 137 mmol/L (ref 135–145)

## 2023-07-03 LAB — URINALYSIS, ROUTINE W REFLEX MICROSCOPIC
Bilirubin Urine: NEGATIVE
Glucose, UA: NEGATIVE mg/dL
Hgb urine dipstick: NEGATIVE
Ketones, ur: NEGATIVE mg/dL
Leukocytes,Ua: NEGATIVE
Nitrite: NEGATIVE
Protein, ur: NEGATIVE mg/dL
Specific Gravity, Urine: 1.004 — ABNORMAL LOW (ref 1.005–1.030)
pH: 6 (ref 5.0–8.0)

## 2023-07-03 LAB — TROPONIN I (HIGH SENSITIVITY)
Troponin I (High Sensitivity): 7 ng/L (ref <18)
Troponin I (High Sensitivity): 7 ng/L (ref <18)

## 2023-07-03 LAB — BRAIN NATRIURETIC PEPTIDE: B Natriuretic Peptide: 18 pg/mL (ref 0.0–100.0)

## 2023-07-03 LAB — LIPASE, BLOOD: Lipase: 25 U/L (ref 11–51)

## 2023-07-03 MED ORDER — PANTOPRAZOLE SODIUM 40 MG PO TBEC: 40.0000 mg | DELAYED_RELEASE_TABLET | Freq: Every day | ORAL | 0 refills | Status: DC | PRN

## 2023-07-03 MED ORDER — METHOCARBAMOL 500 MG PO TABS
500.0000 mg | ORAL_TABLET | Freq: Once | ORAL | Status: AC
  Administered 2023-07-03: 500 mg via ORAL
  Filled 2023-07-03: qty 1

## 2023-07-03 MED ORDER — ONDANSETRON 4 MG PO TBDP
4.0000 mg | ORAL_TABLET | Freq: Once | ORAL | Status: AC
  Administered 2023-07-03: 4 mg via ORAL
  Filled 2023-07-03: qty 1

## 2023-07-03 MED ORDER — HYDROCODONE-ACETAMINOPHEN 5-325 MG PO TABS
1.0000 | ORAL_TABLET | Freq: Once | ORAL | Status: AC
  Administered 2023-07-03: 1 via ORAL
  Filled 2023-07-03: qty 1

## 2023-07-03 MED ORDER — ALUM & MAG HYDROXIDE-SIMETH 200-200-20 MG/5ML PO SUSP
30.0000 mL | Freq: Once | ORAL | Status: AC
  Administered 2023-07-03: 30 mL via ORAL
  Filled 2023-07-03: qty 30

## 2023-07-03 MED ORDER — LACTATED RINGERS IV BOLUS
1000.0000 mL | Freq: Once | INTRAVENOUS | Status: AC
  Administered 2023-07-03: 1000 mL via INTRAVENOUS

## 2023-07-03 MED ORDER — LIDOCAINE VISCOUS HCL 2 % MT SOLN
15.0000 mL | Freq: Once | OROMUCOSAL | Status: AC
  Administered 2023-07-03: 15 mL via ORAL
  Filled 2023-07-03: qty 15

## 2023-07-03 NOTE — ED Provider Triage Note (Signed)
Emergency Medicine Provider Triage Evaluation Note  Francisco Collier , a 82 y.o. male  was evaluated in triage.  Pt complains of elevated Bpx1 week w/associated headache and dizziness. Dizziness worse when standing. Denies head injury.  Reports chest pain that is pressurized and substernal starting today. Reports sob when laying down   Review of Systems  Positive: Chest pain, dizziness Negative: fever  Physical Exam  Ht 6\' 1"  (1.854 m)   Wt 97 kg   BMI 28.21 kg/m  Gen:   Awake, no distress   Resp:  Normal effort  MSK:   Moves extremities without difficulty  Other:  Weakness to BLE (which pt says is baseline)  Medical Decision Making  Medically screening exam initiated at 3:48 PM.  Appropriate orders placed.  Francisco Collier was informed that the remainder of the evaluation will be completed by another provider, this initial triage assessment does not replace that evaluation, and the importance of remaining in the ED until their evaluation is complete.    [name], [place] 07/03/23 1552

## 2023-07-03 NOTE — Discharge Instructions (Signed)
You were seen today for chest pain. While you were here we monitored your vitals, preformed a physical exam, and gave you medications. These were all reassuring and there is no indication for any further testing or intervention in the emergency department at this time.   Things to do:  - Follow up with your primary care provider within the next 1-2 weeks - Start keeping a blood pressure log, and take it with you to your primary care.  Return to the emergency department if you have any new or worsening symptoms including worsening chest pain, dizziness, shortness of breath, or if you have any other concerns.

## 2023-07-03 NOTE — ED Triage Notes (Signed)
PT BIB EMS for complaints of dizziness and nausea x 1 week. No vomiting. Dizziness worse with standing up. Chest pain started when arriving to hospital .

## 2023-07-03 NOTE — ED Provider Notes (Signed)
[place] EMERGENCY DEPARTMENT AT [facility] Provider Note  MDM   HPI/ROS:  Francisco Collier is a 82 y.o. male with a medical history as below who presents for evaluation of nausea, chest pain, headache and lightheadedness.  Reports that it started today.  Has not tried anything at home.  Denies any vomiting, diarrhea.  No fever or chills.  No shortness of breath.  States he is concerned about his blood pressure.  When asked specifically about dizziness he reports that its mostly just lightheadedness and does endorse some orthostatic symptoms.  His primary complaint is nausea.  His exam is without present evidence of volume overload but he does not or endorse some orthopnea so I will order a BNP.  Low suspicion for heart failure.  EKG is without any signs of active ischemia.  Given the timing of pain to ER presentation, delta troponin was drawn and was flat so low suspicion for an NSTEMI.  Presentation was not consistent with an acute PE, pneumothorax, pneumonia.  Do suspect that there may some be some element of GERD so we will give GI cocktail and reevaluate the patient.  Will add a UA and hepatic function as well as lipase given his increased urination, and reported nausea.  Overall vital signs are stable he is afebrile, hemodynamically stable and nontoxic-appearing.  Interpretations, interventions, and the patient's course of care are documented below.    Clinical Course as of 07/04/23 0108  Mon Jul 03, 2023  1939 Patient reevaluated, and he has started feeling feeling better [BB]    Clinical Course User Index [BB] Fayrene Helper, MD    On reevaluation, the patient did endorse some leg pain and cramping.  It resolved with p.o. analgesia.  After treatment, symptoms have nearly resolved and states he feels comfortable with the plan for discharge.  I did have a long discussion about the patient's blood pressure with him, and gave him specific instructions to start a blood pressure log  and evaluate with his primary care doctor in 1 to 2 weeks.  Disposition:  I discussed the plan for discharge with the patient and/or their surrogate at bedside prior to discharge and they were in agreement with the plan and verbalized understanding of the return precautions provided. All questions answered to the best of my ability. Ultimately, the patient was discharged in stable condition with stable vital signs. I am reassured that they are capable of close follow up and good social support at home.   Clinical Impression:  1. Chest wall pain   2. Dizziness     Rx / DC Orders ED Discharge Orders          Ordered    pantoprazole (PROTONIX) 40 MG tablet  Daily PRN        07/03/23 2231            The plan for this patient was discussed with Dr. Wallace Cullens, who voiced agreement and who oversaw evaluation and treatment of this patient.   Clinical Complexity A medically appropriate history, review of systems, and physical exam was performed.  My independent interpretations of EKG, labs, and radiology are documented in the ED course above.   If decision rules were used in this patient's evaluation, they are listed below.   Click here for ABCD2, HEART and other calculatorsREFRESH Note before signing   Patient's presentation is most consistent with acute presentation with potential threat to life or bodily function.  Medical Decision Making Amount and/or Complexity of Data  Reviewed External Data Reviewed: notes.    Details: Outpatient cardiology visit Labs: ordered. Decision-making details documented in ED Course. Radiology: ordered. ECG/medicine tests: ordered and independent interpretation performed. Decision-making details documented in ED Course.  Risk OTC drugs. Prescription drug management. Parenteral controlled substances. Decision regarding hospitalization. Diagnosis or treatment significantly limited by social determinants of health.    HPI/ROS      See MDM  section for pertinent HPI and ROS. A complete ROS was performed with pertinent positives/negatives noted above.   Past Medical History:  Diagnosis Date   Arthritis    back   CAD (coronary artery disease)    COPD (chronic obstructive pulmonary disease) (HCC)    Diverticulitis    Esophageal stricture    Gastric ulcer    GERD (gastroesophageal reflux disease)    Hyperlipemia    Hypertension    Kidney stone    MI (myocardial infarction) (HCC) 12/13/2003   Prostatic hypertrophy    S/P orchiectomy    Shortness of breath    Vertigo     Past Surgical History:  Procedure Laterality Date   ABDOMINAL EXPOSURE N/A 07/25/2022   Procedure: ABDOMINAL EXPOSURE FOR ANTERIOR LUMBAR SPINE SURGERY;  Surgeon: Cephus Shelling, MD;  Location: [facility] OR;  Service: Vascular;  Laterality: N/A;   ANGIOPLASTY  2005   Stent   APPENDECTOMY     BACK SURGERY     CARDIAC CATHETERIZATION N/A 11/12/2015   Procedure: Left Heart Cath and Coronary Angiography;  Surgeon: Rinaldo Cloud, MD;  Location: [facility] INVASIVE CV LAB;  Service: Cardiovascular;  Laterality: N/A;   CHOLECYSTECTOMY     CHOLECYSTECTOMY     CORONARY ANGIOPLASTY     ESOPHAGOGASTRODUODENOSCOPY N/A 10/06/2020   Procedure: ESOPHAGOGASTRODUODENOSCOPY (EGD);  Surgeon: Beverley Fiedler, MD;  Location: [facility];  Service: Gastroenterology;  Laterality: N/A;   FOREIGN BODY REMOVAL  10/06/2020   Procedure: FOREIGN BODY REMOVAL;  Surgeon: Beverley Fiedler, MD;  Location: [facility] ENDOSCOPY;  Service: Gastroenterology;;   THYROID SURGERY        Physical Exam   Vitals:   07/03/23 2147 07/03/23 2152 07/03/23 2200 07/03/23 2223  BP: 138/66 132/69 (!) 151/110 (!) 151/110  Pulse: 95 96 91 85  Resp:    19  Temp:    98 F (36.7 C)  TempSrc:      SpO2: 96% 96% 98% 96%  Weight:      Height:        Physical Exam Vitals and nursing note reviewed.  Constitutional:      General: He is not in acute distress.    Appearance: He is well-developed.  HENT:     Head:  Normocephalic and atraumatic.  Eyes:     Conjunctiva/sclera: Conjunctivae normal.     Pupils: Pupils are equal, round, and reactive to light.  Cardiovascular:     Rate and Rhythm: Normal rate and regular rhythm.     Heart sounds: No murmur heard. Pulmonary:     Effort: Pulmonary effort is normal. No respiratory distress.     Breath sounds: Normal breath sounds. No wheezing, rhonchi or rales.  Abdominal:     General: Bowel sounds are normal.     Palpations: Abdomen is soft.     Tenderness: There is no abdominal tenderness.  Musculoskeletal:        General: No swelling.     Cervical back: Neck supple.     Right lower leg: No edema.     Left lower leg: No edema.  Skin:    General: Skin is warm and dry.     Capillary Refill: Capillary refill takes less than 2 seconds.     Coloration: Skin is not cyanotic or pale.  Neurological:     General: No focal deficit present.     Mental Status: He is alert and oriented to person, place, and time.  Psychiatric:        Mood and Affect: Mood normal.      Procedures   If procedures were preformed on this patient, they are listed below:  Procedures   Fayrene Helper, MD Emergency Medicine PGY-2   Please note that this documentation was produced with the assistance of voice-to-text technology and may contain errors.    Fayrene Helper, MD 07/04/23 0110    Franne Forts, DO 07/14/23 1330

## 2023-08-14 IMAGING — CT CT ANGIO HEAD-NECK (W OR W/O PERF)
1 of 11 series · 14 of 47 positions shown · non-contrast
Comparison: Brain MRI 02/14/2022, CT/CTA head and neck 02/13/2022

CLINICAL DATA: Dizziness

EXAM:
CT ANGIOGRAPHY HEAD AND NECK
TECHNIQUE: Multidetector CT imaging of the head and neck was performed using
the standard protocol during bolus administration of intravenous
contrast. Multiplanar CT image reconstructions and MIPs were
obtained to evaluate the vascular anatomy. Carotid stenosis
measurements (when applicable) are obtained utilizing NASCET
criteria, using the distal internal carotid diameter as the
denominator.

[Series 16: thin · axial · 0.53mm/px · z∈[-140,+175]mm · 14 of 728 slices shown]
[im 49/728  brain]
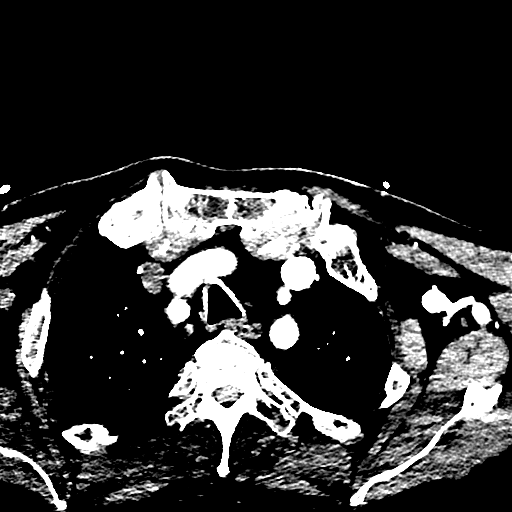
[im 97/728  bone]
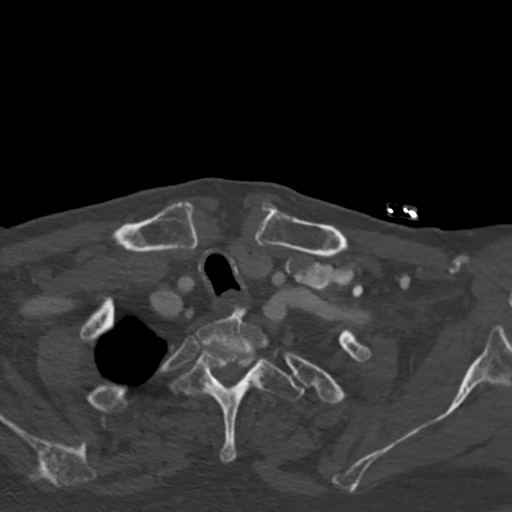
[im 146/728  brain]
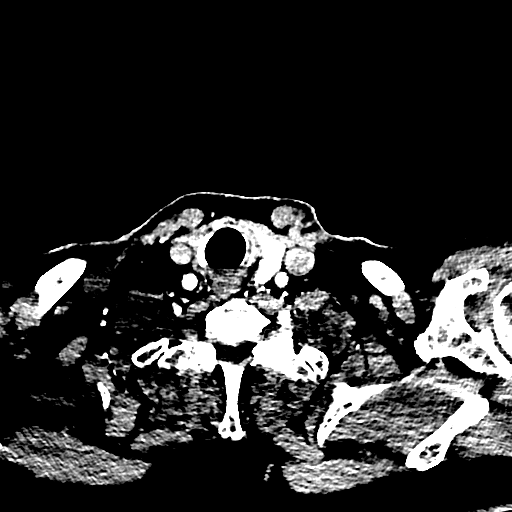
[im 194/728  bone]
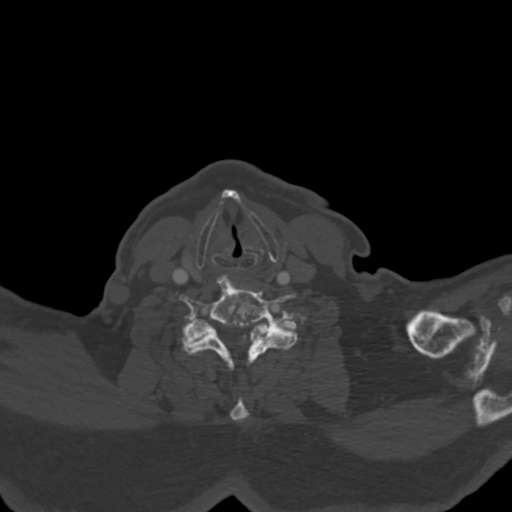
[im 243/728  brain]
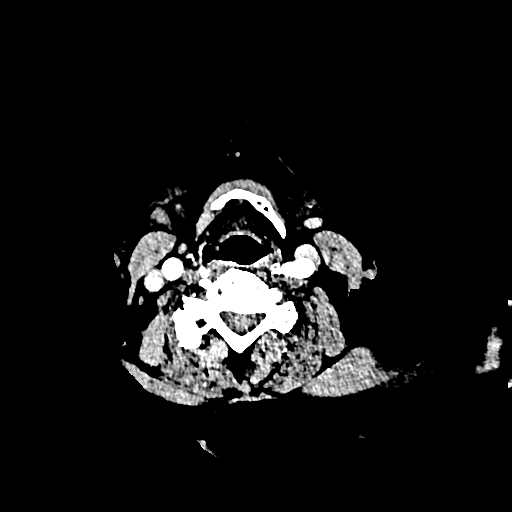
[im 291/728  bone]
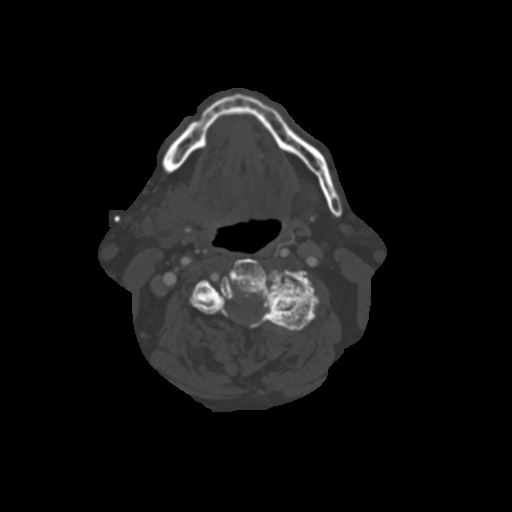
[im 340/728  brain]
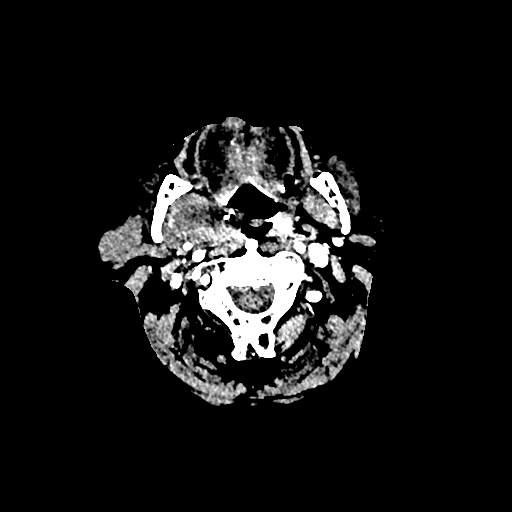
[im 388/728  bone]
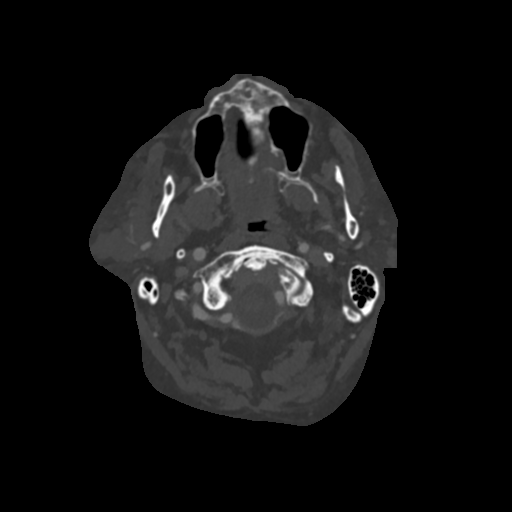
[im 437/728  brain]
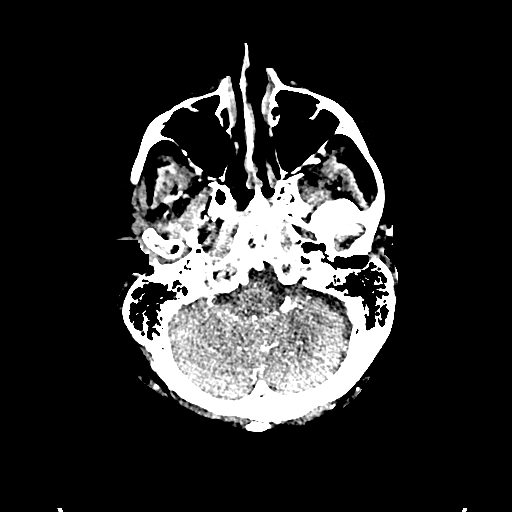
[im 485/728  bone]
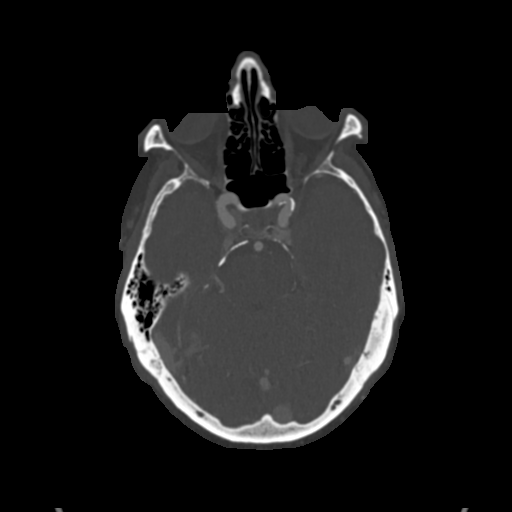
[im 534/728  brain]
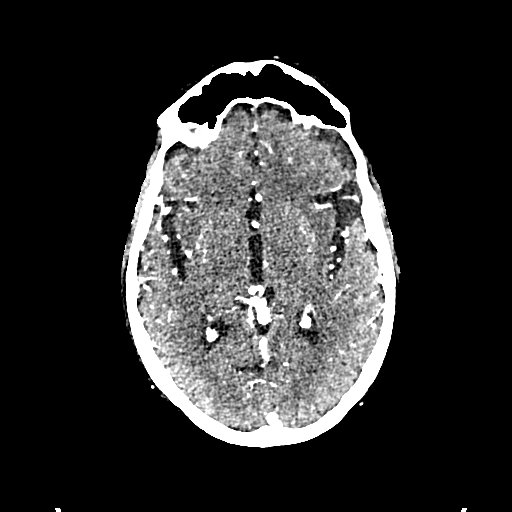
[im 582/728  bone]
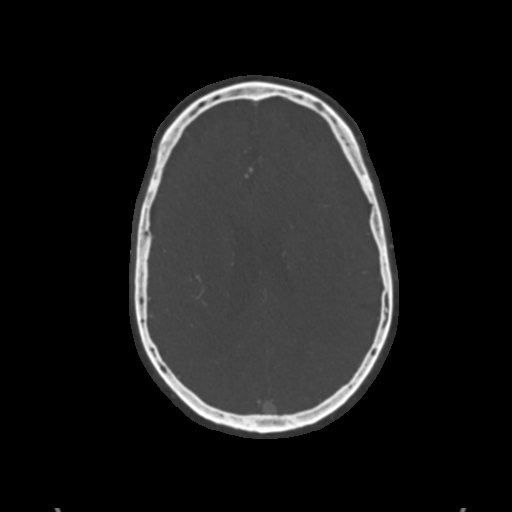
[im 631/728  brain]
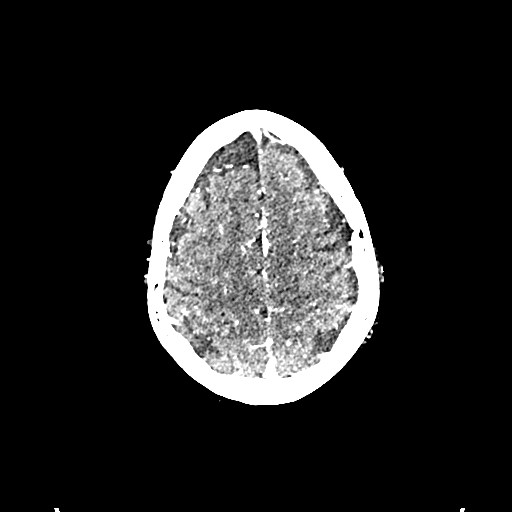
[im 679/728  bone]
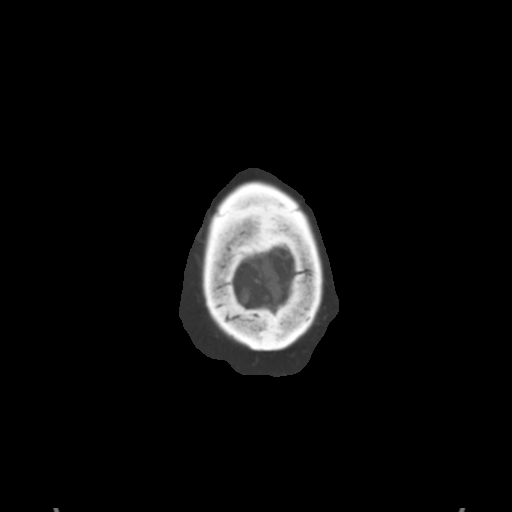

[14 of 47 positions shown; findings below may reference images not displayed]

RADIATION DOSE REDUCTION: This exam was performed according to the
departmental dose-optimization program which includes automated
exposure control, adjustment of the mA and/or kV according to
patient size and/or use of iterative reconstruction technique.

CONTRAST:  75mL OMNIPAQUE IOHEXOL 350 MG/ML SOLN
FINDINGS: CT HEAD FINDINGS

Brain: There is no evidence of acute intracranial hemorrhage,
extra-axial fluid collection, or acute infarct.

Mild parenchymal volume loss with prominence of the extra-axial CSF
spaces overlying the frontal lobes is unchanged going back multiple
prior studies. Gray-white differentiation is preserved. Patchy
hypodensity in the subcortical and periventricular white matter
likely reflects sequela of mild chronic white matter
microangiopathy.

There is no mass lesion.  There is no mass effect or midline shift.

Vascular: See below

Skull: Normal. Negative for fracture or focal lesion.

Sinuses: Paranasal sinuses are clear.

Orbits: The globes and orbits are unremarkable.

Review of the MIP images confirms the above findings

CTA NECK FINDINGS

Aortic arch: The aortic arch is unremarkable. The origins of the
major branch vessels are patent. The subclavian arteries are patent
to the level imaged. There is a common origin of the brachiocephalic
and left common carotid arteries, a normal variant.

Right carotid system: Right common, internal, and external carotid
arteries are patent with minimal plaque at the bifurcation but no
hemodynamically significant stenosis or occlusion. There is no
dissection or aneurysm.

Left carotid system: The left common, internal, and external carotid
arteries are patent with minimal plaque of the bifurcation but no
hemodynamically significant stenosis or occlusion. There is no
dissection or aneurysm.

Vertebral arteries: The vertebral arteries are patent there is
unchanged moderate stenosis of the left vertebral artery at the
level of the C3 transverse foramen due to mass effect from adjacent
osteophytes. There is no other significant stenosis or occlusion.
There is no dissection or aneurysm.

Skeleton: There is multilevel degenerative change of the cervical
spine. There is no acute osseous abnormality or suspicious osseous
lesion. There is no visible canal hematoma.

Other neck: A solid mass within the right parotid gland extending to
the parapharyngeal space with scattered foci of internal
calcification measuring up to approximately 7.6 cm TV x 3.0 cm AP is
not significantly changed since 12/28/2020. The soft tissues are
otherwise unremarkable.

Upper chest: There is emphysema in the lung apices.

Review of the MIP images confirms the above findings

CTA HEAD FINDINGS

Anterior circulation:

There is calcified atherosclerotic plaque in the intracranial ICAs
resulting in up to moderate stenosis of the left supraclinoid
segment, unchanged.

The bilateral MCAs are patent.

The left A1 segment is hypoplastic, unchanged. Otherwise, the
anterior cerebral arteries are patent. The anterior communicating
artery is normal.

There is no aneurysm or AVM.

Posterior circulation:

The bilateral V4 segments are patent with mild calcified plaque on
the left but no hemodynamically significant stenosis. PICA is
identified bilaterally. The basilar artery is patent.

The bilateral PCAs are patent with unchanged mild stenosis of the
right P2 segment. The posterior communicating arteries are not
identified.

Venous sinuses: Patent.

Anatomic variants: None.

Review of the MIP images confirms the above findings
IMPRESSION: 1. No acute intracranial pathology. Or emergent large vessel
occlusion.
2. Mild intracranial atherosclerotic disease with up to moderate
stenosis of the left supraclinoid ICA. No other hemodynamically
significant stenosis.
3. Unchanged moderate stenosis of the distal left V2 segment at the
level of the C3 transverse foramen due to adjacent osteophytes.
Otherwise, patent vasculature of the neck with no other
hemodynamically significant stenosis.
4. Unchanged mass in the right parotid gland/parapharyngeal space,
likely benign given presence since [DATE].  Emphysema ([id number]).

## 2024-02-29 ENCOUNTER — Encounter: Payer: Self-pay | Admitting: Cardiology

## 2024-02-29 ENCOUNTER — Other Ambulatory Visit (HOSPITAL_COMMUNITY): Payer: Self-pay | Admitting: Cardiology

## 2024-02-29 DIAGNOSIS — R079 Chest pain, unspecified: Secondary | ICD-10-CM

## 2024-03-08 ENCOUNTER — Ambulatory Visit (HOSPITAL_COMMUNITY)

## 2024-03-08 ENCOUNTER — Encounter (HOSPITAL_COMMUNITY): Payer: Self-pay

## 2024-03-11 ENCOUNTER — Other Ambulatory Visit: Payer: Self-pay

## 2024-03-11 ENCOUNTER — Encounter (HOSPITAL_COMMUNITY): Payer: Self-pay | Admitting: Emergency Medicine

## 2024-03-11 ENCOUNTER — Emergency Department (HOSPITAL_COMMUNITY)
Admission: EM | Admit: 2024-03-11 | Discharge: 2024-03-12 | Disposition: A | Discharge status: Home / Self Care | Attending: Emergency Medicine | Admitting: Emergency Medicine

## 2024-03-11 ENCOUNTER — Emergency Department (HOSPITAL_COMMUNITY)

## 2024-03-11 DIAGNOSIS — R030 Elevated blood-pressure reading, without diagnosis of hypertension: Secondary | ICD-10-CM | POA: Diagnosis present

## 2024-03-11 DIAGNOSIS — Z79899 Other long term (current) drug therapy: Secondary | ICD-10-CM | POA: Diagnosis not present

## 2024-03-11 DIAGNOSIS — R109 Unspecified abdominal pain: Secondary | ICD-10-CM | POA: Diagnosis not present

## 2024-03-11 DIAGNOSIS — G8929 Other chronic pain: Secondary | ICD-10-CM | POA: Diagnosis not present

## 2024-03-11 DIAGNOSIS — I10 Essential (primary) hypertension: Secondary | ICD-10-CM | POA: Insufficient documentation

## 2024-03-11 LAB — CBC WITH DIFFERENTIAL/PLATELET
Abs Immature Granulocytes: 0.03 10*3/uL (ref 0.00–0.07)
Basophils Absolute: 0 10*3/uL (ref 0.0–0.1)
Basophils Relative: 0 %
Eosinophils Absolute: 0.1 10*3/uL (ref 0.0–0.5)
Eosinophils Relative: 1 %
HCT: 38.9 % — ABNORMAL LOW (ref 39.0–52.0)
Hemoglobin: 12.4 g/dL — ABNORMAL LOW (ref 13.0–17.0)
Immature Granulocytes: 0 %
Lymphocytes Relative: 25 %
Lymphs Abs: 2 10*3/uL (ref 0.7–4.0)
MCH: 28.5 pg (ref 26.0–34.0)
MCHC: 31.9 g/dL (ref 30.0–36.0)
MCV: 89.4 fL (ref 80.0–100.0)
Monocytes Absolute: 0.7 10*3/uL (ref 0.1–1.0)
Monocytes Relative: 8 %
Neutro Abs: 5.4 10*3/uL (ref 1.7–7.7)
Neutrophils Relative %: 66 %
Platelets: 271 10*3/uL (ref 150–400)
RBC: 4.35 MIL/uL (ref 4.22–5.81)
RDW: 13.7 % (ref 11.5–15.5)
WBC: 8.2 10*3/uL (ref 4.0–10.5)
nRBC: 0 % (ref 0.0–0.2)

## 2024-03-11 NOTE — ED Provider Notes (Signed)
 [place] EMERGENCY DEPARTMENT AT [facility] Provider Note   CSN: [id number] Arrival date & time: 03/11/24  2209     History  Chief Complaint  Patient presents with   Hypertension    Francisco Collier is a 83 y.o. male.  Patient to ED with multiple complaints including dizziness for unknown duration of time. No falls, pain, nausea, headache. He complains of abdominal discomfort since surgery was done on his back 1 1/2 years ago. Unchanged. He expresses a concern for his blood pressure which he reports as 150's systolic tonight. He was able to state blood pressure readings at home earlier today that were within normal range. No chest pain, peripheral edema, SOB.   The history is provided by the patient. No language interpreter was used.  Hypertension       Home Medications Prior to Admission medications   Medication Sig Start Date End Date Taking? Authorizing Provider  amLODipine (NORVASC) 5 MG tablet     [provider]  atorvastatin (LIPITOR) 10 MG tablet Take 10 mg by mouth daily.    [provider]  BREZTRI AEROSPHERE 160-9-4.8 MCG/ACT AERO Inhale 2 puffs into the lungs daily as needed (wheezing, shortness of breath). 04/08/22   [provider]  clopidogrel (PLAVIX) 75 MG tablet Take 1 tablet (75 mg total) by mouth daily. 02/16/22   Elmer Picker, NP  Cyanocobalamin (VITAMIN B-12 PO) Take 1 capsule by mouth daily.    [provider]  fenofibrate 160 MG tablet Take 160 mg by mouth daily.    [provider]  fluticasone (FLONASE) 50 MCG/ACT nasal spray Place 1 spray into both nostrils 2 (two) times daily as needed for allergies.    [provider]  lisinopril (PRINIVIL) 10 MG tablet Take 1 tablet (10 mg total) by mouth daily. 11/13/15   Rinaldo Cloud, MD  meclizine (ANTIVERT) 25 MG tablet Take 1 tablet (25 mg total) by mouth 3 (three) times daily as needed for dizziness. 04/06/22   Rancour, Jeannett Senior, MD  metoprolol  tartrate (LOPRESSOR) 25 MG tablet Take 0.5 tablets (12.5 mg total) by mouth 2 (two) times daily. Patient taking differently: Take 6.25 mg by mouth 2 (two) times daily. 11/13/15   Rinaldo Cloud, MD  ondansetron (ZOFRAN) 4 MG tablet Take 1 tablet (4 mg total) by mouth every 8 (eight) hours as needed for nausea or vomiting. 07/25/22   Venita Lick, MD  pantoprazole (PROTONIX) 40 MG tablet Take 1 tablet (40 mg total) by mouth daily as needed (acid reflux). 07/03/23   Fayrene Helper, MD      Allergies    Codeine, Niaspan [niacin er (antihyperlipidemic)], Neurontin [gabapentin], and Statins    Review of Systems   Review of Systems  Physical Exam Updated Vital Signs BP 137/77   Pulse 87   Resp 19   SpO2 93%  Physical Exam Constitutional:      Appearance: He is well-developed.  HENT:     Head: Normocephalic.  Cardiovascular:     Rate and Rhythm: Normal rate and regular rhythm.     Heart sounds: No murmur heard. Pulmonary:     Effort: Pulmonary effort is normal.     Breath sounds: Normal breath sounds. No wheezing, rhonchi or rales.  Abdominal:     General: Bowel sounds are normal. There is no distension.     Palpations: Abdomen is soft.     Tenderness: There is abdominal tenderness (Diffuse). There is no guarding or rebound.  Musculoskeletal:  General: Normal range of motion.     Cervical back: Normal range of motion and neck supple.     Right lower leg: No edema.     Left lower leg: No edema.  Skin:    General: Skin is warm and dry.  Neurological:     General: No focal deficit present.     Mental Status: He is alert and oriented to person, place, and time.     ED Results / Procedures / Treatments   Labs (all labs ordered are listed, but only abnormal results are displayed) Labs Reviewed  CBC WITH DIFFERENTIAL/PLATELET  COMPREHENSIVE METABOLIC PANEL WITH GFR    EKG None  Radiology DG Abdomen 1 View Result Date: 03/11/2024 CLINICAL DATA:  Abdominal pain EXAM:  ABDOMEN - 1 VIEW COMPARISON:  07/25/2022 FINDINGS: The bowel gas pattern is normal. No radio-opaque calculi or other significant radiographic abnormality are seen. Postoperative changes in the lumbar spine. IMPRESSION: No acute findings. Electronically Signed   By: Charlett Nose M.D.   On: 03/11/2024 23:39    Procedures Procedures    Medications Ordered in ED Medications - No data to display  ED Course/ Medical Decision Making/ A&P Clinical Course as of 03/11/24 2343  Mon Mar 11, 2024  2339 Patient with concern for elevated blood pressure. Labs pending at time of sign out to oncoming provider. KUB done to reassure patient, also pending at time of sign out at end of shift.  [SU]    Clinical Course User Index [SU] Elpidio Anis, PA-C                                 Medical Decision Making Amount and/or Complexity of Data Reviewed Labs: ordered. Radiology: ordered.           Final Clinical Impression(s) / ED Diagnoses Final diagnoses:  Hypertension, unspecified type  Chronic abdominal pain    Rx / DC Orders ED Discharge Orders     None         Elpidio Anis, PA-C 03/11/24 2343    Anders Simmonds T, DO 03/15/24 1502

## 2024-03-11 NOTE — ED Triage Notes (Signed)
 BIBA Per EMS: Pt c/o HTN. Normal BP in the 130s. BP at home 150s.  160/90 last BP w/ EMS  95 HR  Pt appears anxious

## 2024-03-11 NOTE — Discharge Instructions (Signed)
 Continue your regular medications for blood pressure. It is recommended that you check your pressure at home no more that twice daily at the same time every day. Keep a record of the measurements and review this information with your primary care doctor in 2 weeks.   No cause for your chronic abdominal pain was identified in the emergency room tonight. Please pursue any further evaluation with your doctor.

## 2024-03-12 LAB — COMPREHENSIVE METABOLIC PANEL WITH GFR
ALT: 15 U/L (ref 0–44)
AST: 14 U/L — ABNORMAL LOW (ref 15–41)
Albumin: 3.5 g/dL (ref 3.5–5.0)
Alkaline Phosphatase: 69 U/L (ref 38–126)
Anion gap: 6 (ref 5–15)
BUN: 13 mg/dL (ref 8–23)
CO2: 27 mmol/L (ref 22–32)
Calcium: 8.8 mg/dL — ABNORMAL LOW (ref 8.9–10.3)
Chloride: 102 mmol/L (ref 98–111)
Creatinine, Ser: 0.84 mg/dL (ref 0.61–1.24)
GFR, Estimated: >60 mL/min (ref >60)
Glucose, Bld: 108 mg/dL — ABNORMAL HIGH (ref 70–99)
Potassium: 4.4 mmol/L (ref 3.5–5.1)
Sodium: 135 mmol/L (ref 135–145)
Total Bilirubin: 0.8 mg/dL (ref 0.0–1.2)
Total Protein: 6.3 g/dL — ABNORMAL LOW (ref 6.5–8.1)

## 2024-03-12 NOTE — ED Provider Notes (Signed)
 Labs and vitals look good.  Patient encouraged to check BP twice a day only.  PCP follow-up.  States that he feels stressed and anxious and thinks that may be contributing.   Roxy Horseman, PA-C 03/12/24 0036    Nira Conn, MD 03/12/24 [phone]

## 2024-03-12 NOTE — ED Notes (Signed)
 Reviewed D/C information with the patient, pt verbalized understanding. No additional concerns at this time.

## 2024-09-18 ENCOUNTER — Encounter: Payer: Self-pay | Admitting: Nurse Practitioner

## 2024-10-12 ENCOUNTER — Emergency Department (HOSPITAL_COMMUNITY)

## 2024-10-12 ENCOUNTER — Inpatient Hospital Stay (HOSPITAL_COMMUNITY)

## 2024-10-12 ENCOUNTER — Other Ambulatory Visit: Payer: Self-pay

## 2024-10-12 ENCOUNTER — Inpatient Hospital Stay (HOSPITAL_COMMUNITY)
Admission: EM | Admit: 2024-10-12 | Discharge: 2024-10-15 | DRG: 303 | Disposition: A | Discharge status: Home / Self Care | Attending: Cardiovascular Disease | Admitting: Cardiovascular Disease

## 2024-10-12 DIAGNOSIS — I252 Old myocardial infarction: Secondary | ICD-10-CM | POA: Diagnosis not present

## 2024-10-12 DIAGNOSIS — Z8673 Personal history of transient ischemic attack (TIA), and cerebral infarction without residual deficits: Secondary | ICD-10-CM

## 2024-10-12 DIAGNOSIS — I4510 Unspecified right bundle-branch block: Secondary | ICD-10-CM | POA: Diagnosis present

## 2024-10-12 DIAGNOSIS — I25118 Atherosclerotic heart disease of native coronary artery with other forms of angina pectoris: Principal | ICD-10-CM | POA: Diagnosis present

## 2024-10-12 DIAGNOSIS — E785 Hyperlipidemia, unspecified: Secondary | ICD-10-CM | POA: Diagnosis present

## 2024-10-12 DIAGNOSIS — Z79899 Other long term (current) drug therapy: Secondary | ICD-10-CM

## 2024-10-12 DIAGNOSIS — Z85038 Personal history of other malignant neoplasm of large intestine: Secondary | ICD-10-CM

## 2024-10-12 DIAGNOSIS — N400 Enlarged prostate without lower urinary tract symptoms: Secondary | ICD-10-CM | POA: Diagnosis present

## 2024-10-12 DIAGNOSIS — I3139 Other pericardial effusion (noninflammatory): Secondary | ICD-10-CM | POA: Diagnosis present

## 2024-10-12 DIAGNOSIS — R7303 Prediabetes: Secondary | ICD-10-CM | POA: Diagnosis present

## 2024-10-12 DIAGNOSIS — I10 Essential (primary) hypertension: Secondary | ICD-10-CM | POA: Diagnosis present

## 2024-10-12 DIAGNOSIS — Z9079 Acquired absence of other genital organ(s): Secondary | ICD-10-CM

## 2024-10-12 DIAGNOSIS — Z885 Allergy status to narcotic agent status: Secondary | ICD-10-CM | POA: Diagnosis not present

## 2024-10-12 DIAGNOSIS — Z7951 Long term (current) use of inhaled steroids: Secondary | ICD-10-CM | POA: Diagnosis not present

## 2024-10-12 DIAGNOSIS — K219 Gastro-esophageal reflux disease without esophagitis: Secondary | ICD-10-CM | POA: Diagnosis present

## 2024-10-12 DIAGNOSIS — R918 Other nonspecific abnormal finding of lung field: Secondary | ICD-10-CM | POA: Diagnosis present

## 2024-10-12 DIAGNOSIS — J449 Chronic obstructive pulmonary disease, unspecified: Secondary | ICD-10-CM | POA: Diagnosis present

## 2024-10-12 DIAGNOSIS — Z888 Allergy status to other drugs, medicaments and biological substances status: Secondary | ICD-10-CM

## 2024-10-12 DIAGNOSIS — E041 Nontoxic single thyroid nodule: Principal | ICD-10-CM

## 2024-10-12 DIAGNOSIS — Z87891 Personal history of nicotine dependence: Secondary | ICD-10-CM | POA: Diagnosis not present

## 2024-10-12 DIAGNOSIS — Z9049 Acquired absence of other specified parts of digestive tract: Secondary | ICD-10-CM

## 2024-10-12 DIAGNOSIS — I249 Acute ischemic heart disease, unspecified: Secondary | ICD-10-CM | POA: Diagnosis present

## 2024-10-12 DIAGNOSIS — M549 Dorsalgia, unspecified: Secondary | ICD-10-CM | POA: Diagnosis present

## 2024-10-12 DIAGNOSIS — G8929 Other chronic pain: Secondary | ICD-10-CM | POA: Diagnosis present

## 2024-10-12 DIAGNOSIS — Z7902 Long term (current) use of antithrombotics/antiplatelets: Secondary | ICD-10-CM | POA: Diagnosis not present

## 2024-10-12 LAB — I-STAT CHEM 8, ED
BUN: 11 mg/dL (ref 8–23)
Calcium, Ion: 1.22 mmol/L (ref 1.15–1.40)
Chloride: 99 mmol/L (ref 98–111)
Creatinine, Ser: 1 mg/dL (ref 0.61–1.24)
Glucose, Bld: 112 mg/dL — ABNORMAL HIGH (ref 70–99)
HCT: 38 % — ABNORMAL LOW (ref 39.0–52.0)
Hemoglobin: 12.9 g/dL — ABNORMAL LOW (ref 13.0–17.0)
Potassium: 4 mmol/L (ref 3.5–5.1)
Sodium: 137 mmol/L (ref 135–145)
TCO2: 25 mmol/L (ref 22–32)

## 2024-10-12 LAB — CBC
HCT: 40.1 % (ref 39.0–52.0)
Hemoglobin: 12.8 g/dL — ABNORMAL LOW (ref 13.0–17.0)
MCH: 27.7 pg (ref 26.0–34.0)
MCHC: 31.9 g/dL (ref 30.0–36.0)
MCV: 86.8 fL (ref 80.0–100.0)
Platelets: 268 K/uL (ref 150–400)
RBC: 4.62 MIL/uL (ref 4.22–5.81)
RDW: 14.6 % (ref 11.5–15.5)
WBC: 7.5 K/uL (ref 4.0–10.5)
nRBC: 0 % (ref 0.0–0.2)

## 2024-10-12 LAB — TROPONIN I (HIGH SENSITIVITY)
Troponin I (High Sensitivity): 10 ng/L (ref <18)
Troponin I (High Sensitivity): 13 ng/L (ref <18)

## 2024-10-12 LAB — HEPARIN LEVEL (UNFRACTIONATED): Heparin Unfractionated: <0.1 [IU]/mL — ABNORMAL LOW (ref 0.30–0.70)

## 2024-10-12 LAB — BASIC METABOLIC PANEL WITH GFR
Anion gap: 13 (ref 5–15)
BUN: 10 mg/dL (ref 8–23)
CO2: 25 mmol/L (ref 22–32)
Calcium: 9 mg/dL (ref 8.9–10.3)
Chloride: 98 mmol/L (ref 98–111)
Creatinine, Ser: 1.01 mg/dL (ref 0.61–1.24)
GFR, Estimated: >60 mL/min (ref >60)
Glucose, Bld: 115 mg/dL — ABNORMAL HIGH (ref 70–99)
Potassium: 4 mmol/L (ref 3.5–5.1)
Sodium: 136 mmol/L (ref 135–145)

## 2024-10-12 MED ORDER — ONDANSETRON HCL 4 MG PO TABS: 4.0000 mg | ORAL_TABLET | Freq: Three times a day (TID) | ORAL | Status: DC | PRN

## 2024-10-12 MED ORDER — SODIUM CHLORIDE 0.9% FLUSH: 3.0000 mL | INTRAVENOUS | Status: DC | PRN

## 2024-10-12 MED ORDER — FLUTICASONE PROPIONATE 50 MCG/ACT NA SUSP: 1.0000 | Freq: Two times a day (BID) | NASAL | Status: DC | PRN

## 2024-10-12 MED ORDER — HEPARIN (PORCINE) 25000 UT/250ML-% IV SOLN
1750.0000 [IU]/h | INTRAVENOUS | Status: DC
  Administered 2024-10-12: 1300 [IU]/h via INTRAVENOUS
  Administered 2024-10-13 (×2): 1550 [IU]/h via INTRAVENOUS
  Administered 2024-10-14: 1750 [IU]/h via INTRAVENOUS
  Filled 2024-10-12 (×5): qty 250

## 2024-10-12 MED ORDER — SODIUM CHLORIDE 0.9 % IV SOLN: 250.0000 mL | INTRAVENOUS | Status: DC | PRN

## 2024-10-12 MED ORDER — HEPARIN BOLUS VIA INFUSION
2000.0000 [IU] | Freq: Once | INTRAVENOUS | Status: AC
Start: 2024-10-12 — End: 2024-10-12
  Administered 2024-10-12: 2000 [IU] via INTRAVENOUS
  Filled 2024-10-12: qty 2000

## 2024-10-12 MED ORDER — ALUM & MAG HYDROXIDE-SIMETH 200-200-20 MG/5ML PO SUSP
30.0000 mL | Freq: Once | ORAL | Status: AC
  Administered 2024-10-12: 30 mL via ORAL
  Filled 2024-10-12: qty 30

## 2024-10-12 MED ORDER — HEPARIN BOLUS VIA INFUSION
4000.0000 [IU] | Freq: Once | INTRAVENOUS | Status: AC
  Administered 2024-10-12: 4000 [IU] via INTRAVENOUS
  Filled 2024-10-12: qty 4000

## 2024-10-12 MED ORDER — BUDESON-GLYCOPYRROL-FORMOTEROL 160-9-4.8 MCG/ACT IN AERO: 2.0000 | INHALATION_SPRAY | Freq: Every day | RESPIRATORY_TRACT | Status: DC | PRN

## 2024-10-12 MED ORDER — LISINOPRIL 10 MG PO TABS
10.0000 mg | ORAL_TABLET | Freq: Every day | ORAL | Status: DC
  Administered 2024-10-12 – 2024-10-15 (×4): 10 mg via ORAL
  Filled 2024-10-12 (×4): qty 1

## 2024-10-12 MED ORDER — ACETAMINOPHEN 325 MG PO TABS
650.0000 mg | ORAL_TABLET | ORAL | Status: DC | PRN
  Administered 2024-10-14: 650 mg via ORAL
  Filled 2024-10-12: qty 2

## 2024-10-12 MED ORDER — METOPROLOL TARTRATE 12.5 MG HALF TABLET
12.5000 mg | ORAL_TABLET | Freq: Two times a day (BID) | ORAL | Status: DC
  Administered 2024-10-12 – 2024-10-15 (×6): 12.5 mg via ORAL
  Filled 2024-10-12 (×6): qty 1

## 2024-10-12 MED ORDER — CLOPIDOGREL BISULFATE 75 MG PO TABS
75.0000 mg | ORAL_TABLET | Freq: Every day | ORAL | Status: DC
  Administered 2024-10-13 – 2024-10-15 (×3): 75 mg via ORAL
  Filled 2024-10-12 (×3): qty 1

## 2024-10-12 MED ORDER — SODIUM CHLORIDE 0.9% FLUSH
3.0000 mL | Freq: Two times a day (BID) | INTRAVENOUS | Status: DC
  Administered 2024-10-12 – 2024-10-13 (×3): 3 mL via INTRAVENOUS

## 2024-10-12 MED ORDER — ASPIRIN 81 MG PO TBEC
81.0000 mg | DELAYED_RELEASE_TABLET | Freq: Every day | ORAL | Status: DC
Start: 2024-10-13 — End: 2024-10-15
  Administered 2024-10-13 – 2024-10-15 (×3): 81 mg via ORAL
  Filled 2024-10-12 (×3): qty 1

## 2024-10-12 MED ORDER — ONDANSETRON HCL 4 MG/2ML IJ SOLN: 4.0000 mg | Freq: Four times a day (QID) | INTRAMUSCULAR | Status: DC | PRN

## 2024-10-12 MED ORDER — LORAZEPAM 0.5 MG PO TABS
0.5000 mg | ORAL_TABLET | Freq: Two times a day (BID) | ORAL | Status: DC | PRN
  Administered 2024-10-12 – 2024-10-13 (×2): 0.5 mg via ORAL
  Filled 2024-10-12 (×3): qty 1

## 2024-10-12 MED ORDER — PANTOPRAZOLE SODIUM 40 MG PO TBEC: 40.0000 mg | DELAYED_RELEASE_TABLET | Freq: Every day | ORAL | Status: DC | PRN

## 2024-10-12 MED ORDER — IOHEXOL 350 MG/ML SOLN
75.0000 mL | Freq: Once | INTRAVENOUS | Status: AC | PRN
  Administered 2024-10-12: 75 mL via INTRAVENOUS

## 2024-10-12 MED ORDER — MECLIZINE HCL 25 MG PO TABS: 25.0000 mg | ORAL_TABLET | Freq: Three times a day (TID) | ORAL | Status: DC | PRN

## 2024-10-12 MED ORDER — NITROGLYCERIN 0.4 MG SL SUBL: 0.4000 mg | SUBLINGUAL_TABLET | SUBLINGUAL | Status: DC | PRN

## 2024-10-12 NOTE — ED Provider Notes (Signed)
 [facility] Provider Note   CSN: [id number] Arrival date & time: 10/12/24  9580     Patient presents with: Chest Pain   Francisco Collier is a 83 y.o. 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  Presents to ED complaining of chest pain which radiates to back.  Reports that he was attempting go to bed this evening when he had a sudden onset of sharp chest pain in his central chest which radiates to his back.  He states the back pain is located in the middle of his back and he denies a history of this.  He states that initially he was short of breath but after EMS arrived and gave him 2 doses of nitroglycerin  his shortness of breath as well as the chest pain resolved.  Initially reports that chest pain was rated 8 out of 10 but after 2 nitroglycerin  reduced to 2 out of 10.  He denies that he is short of breath anymore.  He endorses nausea but denies any vomiting.  States that after he received Zofran  with EMS his nausea resolved.  Denies abdominal pain.  Endorsing dizziness and lightheadedness on standing but denies any weakness.  Denies any blood in stool.  Reports compliance on blood thinning medication.  Denies fevers at home.  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  HPI     Prior to Admission medications   Medication Sig Start Date End Date Taking? Authorizing Provider  amLODipine  (NORVASC ) 5 MG tablet     [provider]  atorvastatin  (LIPITOR) 10 MG tablet Take 10 mg by mouth daily.    [provider]  BREZTRI AEROSPHERE 160-9-4.8 MCG/ACT AERO Inhale 2 puffs into the lungs daily as needed (wheezing, shortness of breath). 04/08/22   [provider]  clopidogrel  (PLAVIX ) 75 MG tablet Take 1 tablet (75 mg total) by mouth daily. 02/16/22   Remi Pippin,  NP  Cyanocobalamin (VITAMIN B-12 PO) Take 1 capsule by mouth daily.    [provider]  fenofibrate  160 MG tablet Take 160 mg by mouth daily.    [provider]  fluticasone  (FLONASE ) 50 MCG/ACT nasal spray Place 1 spray into both nostrils 2 (two) times daily as needed for allergies.    [provider]  lisinopril  (PRINIVIL ) 10 MG tablet Take 1 tablet (10 mg total) by mouth daily. 11/13/15   Levern Hutching, MD  meclizine  (ANTIVERT ) 25 MG tablet Take 1 tablet (25 mg total) by mouth 3 (three) times daily as needed for dizziness. 04/06/22   Rancour, Garnette, MD  metoprolol  tartrate (LOPRESSOR ) 25 MG tablet Take 0.5 tablets (12.5 mg total) by mouth 2 (two) times daily. Patient taking differently: Take 6.25 mg by mouth 2 (two) times daily. 11/13/15   Levern Hutching, MD  ondansetron  (ZOFRAN ) 4 MG tablet Take 1 tablet (4 mg total) by mouth every 8 (eight) hours as needed for nausea or vomiting. 07/25/22   Burnetta Aures, MD  pantoprazole  (PROTONIX ) 40 MG tablet Take 1 tablet (40 mg total) by mouth daily as needed (acid reflux). 07/03/23   Blair Sidle, MD    Allergies: Codeine, Niaspan [niacin er (antihyperlipidemic)], Neurontin  [gabapentin ], and Statins    Review of Systems  Respiratory:  Positive for shortness of breath.   Cardiovascular:  Positive for chest pain.    Updated  Vital Signs BP 112/67   Pulse 74   Temp 97.9 F (36.6 C) (Oral)   Resp 20   Ht 6' 1 (1.854 m)   Wt 99.8 kg   SpO2 94%   BMI 29.03 kg/m   Physical Exam Vitals and nursing note reviewed.  Constitutional:      General: He is not in acute distress.    Appearance: He is well-developed.  HENT:     Head: Normocephalic and atraumatic.  Eyes:     Conjunctiva/sclera: Conjunctivae normal.  Cardiovascular:     Rate and Rhythm: Normal rate and regular rhythm.     Heart sounds: No murmur heard. Pulmonary:     Effort: Pulmonary effort is normal. No respiratory distress.     Breath sounds: Normal  breath sounds.  Abdominal:     Palpations: Abdomen is soft.     Tenderness: There is no abdominal tenderness.  Musculoskeletal:        General: No swelling.     Cervical back: Neck supple.     Right lower leg: No edema.     Left lower leg: No edema.  Skin:    General: Skin is warm and dry.     Capillary Refill: Capillary refill takes less than 2 seconds.  Neurological:     Mental Status: He is alert and oriented to person, place, and time. Mental status is at baseline.  Psychiatric:        Mood and Affect: Mood normal.     (all labs ordered are listed, but only abnormal results are displayed) Labs Reviewed  BASIC METABOLIC PANEL WITH GFR - Abnormal; Notable for the following components:      Result Value   Glucose, Bld 115 (*)    All other components within normal limits  CBC - Abnormal; Notable for the following components:   Hemoglobin 12.8 (*)    All other components within normal limits  I-STAT CHEM 8, ED - Abnormal; Notable for the following components:   Glucose, Bld 112 (*)    Hemoglobin 12.9 (*)    HCT 38.0 (*)    All other components within normal limits  TROPONIN I (HIGH SENSITIVITY)  TROPONIN I (HIGH SENSITIVITY)    EKG: EKG Interpretation Date/Time:  Saturday October 12 2024 04:29:02 EDT Ventricular Rate:  77 PR Interval:  184 QRS Duration:  128 QT Interval:  400 QTC Calculation: 453 R Axis:   57  Text Interpretation: Sinus rhythm Right bundle branch block Confirmed by Nettie, April ([id number]) on 10/12/2024 5:21:37 AM  Radiology: CT Angio Chest/Abd/Pel for Dissection W and/or Wo Contrast Result Date: 10/12/2024 CLINICAL DATA:  Acute chest pain, shortness of breath, and nausea. Acute aortic syndrome. EXAM: CT ANGIOGRAPHY CHEST, ABDOMEN AND PELVIS TECHNIQUE: Non-contrast CT of the chest was initially obtained. Multidetector CT imaging through the chest, abdomen and pelvis was performed using the standard protocol during bolus administration of intravenous  contrast. Multiplanar reconstructed images and MIPs were obtained and reviewed to evaluate the vascular anatomy. RADIATION DOSE REDUCTION: This exam was performed according to the departmental dose-optimization program which includes automated exposure control, adjustment of the mA and/or kV according to patient size and/or use of iterative reconstruction technique. CONTRAST:  75mL OMNIPAQUE  IOHEXOL  350 MG/ML SOLN COMPARISON:  AP only CT on 01/17/2022 FINDINGS: CTA CHEST FINDINGS Cardiovascular: No evidence of mediastinal hematoma or pericardial effusion. No evidence of thoracic aortic dissection or aneurysm. No signs of penetrating atherosclerotic ulcer or intramural hematoma. Small pericardial effusion noted. Aortic and  coronary atherosclerotic calcification also seen. No pulmonary emboli identified. Mediastinum/Nodes: 1.9 cm left thyroid  lobe nodule noted. Prior right thyroidectomy. No pathologically enlarged lymph nodes identified. Lungs/Pleura: No pulmonary mass, infiltrate, or effusion. Several scattered sub-cm pulmonary nodules are seen in the left upper and right lower lobes measuring up to 4 mm. Mild centrilobular emphysema noted. Musculoskeletal: No suspicious bone lesions identified. Review of the MIP images confirms the above findings. CTA ABDOMEN AND PELVIS FINDINGS VASCULAR Aorta: Normal caliber aorta without aneurysm, dissection, vasculitis or significant stenosis. Aortic atherosclerotic calcification noted. Celiac: Patent without evidence of aneurysm, dissection, vasculitis or significant stenosis. SMA: Patent without evidence of aneurysm, dissection, vasculitis or significant stenosis. Renals: Both renal arteries are patent without evidence of aneurysm, dissection, vasculitis, fibromuscular dysplasia or significant stenosis. IMA: Patent without evidence of aneurysm, dissection, vasculitis or significant stenosis. Inflow: Patent without evidence of aneurysm, dissection, vasculitis or significant  stenosis. Veins: No obvious venous abnormality within the limitations of this arterial phase study. Review of the MIP images confirms the above findings. NON-VASCULAR Hepatobiliary: No suspicious hepatic masses identified. Prior cholecystectomy. No evidence of biliary obstruction. Pancreas:  No mass or inflammatory changes. Spleen: Within normal limits in size and appearance. Adrenals/Urinary Tract: No suspicious masses identified. 3 mm nonobstructing calculus noted in lower pole of left kidney. No evidence of ureteral calculi or hydronephrosis. Stomach/Bowel: No evidence of obstruction, inflammatory process or abnormal fluid collections. Sigmoid diverticulosis, without signs of diverticulitis. Vascular/Lymphatic: No pathologically enlarged lymph nodes. No acute vascular findings. Reproductive:  Mildly enlarged prostate. Other:  None. Musculoskeletal: No suspicious bone lesions identified. Lumbar spine fusion hardware noted. IMPRESSION: No acute findings. No evidence of thoracoabdominal aortic aneurysm or dissection. Small pericardial effusion. 1.9 cm left thyroid  lobe nodule. Recommend further evaluation with thyroid  US . (ref: J Am Coll Radiol. 2015 Feb;12(2): 143-50). Scattered bilateral pulmonary nodules measuring up to 4 mm. No follow-up needed if patient is low-risk (and has no known or suspected primary neoplasm). Non-contrast chest CT can be considered in 12 months if patient is high-risk. This recommendation follows the consensus statement: Guidelines for Management of Incidental Pulmonary Nodules Detected on CT Images: From the Fleischner Society 2017; Radiology 2017; 284:228-243. Tiny nonobstructing left renal calculus. No evidence of ureteral calculi or hydronephrosis. Sigmoid diverticulosis, without radiographic evidence of diverticulitis. Mildly enlarged prostate. Electronically Signed   By: Norleen DELENA Kil M.D.   On: 10/12/2024 06:06   DG Chest Port 1 View Result Date: 10/12/2024 EXAM: 1 VIEW(S) XRAY  OF THE CHEST 10/12/2024 04:52:18 AM COMPARISON: AP and lateral chest 07/03/2023. CLINICAL HISTORY: cp, sob FINDINGS: LUNGS AND PLEURA: The lungs are expiratory. There is increased opacity in the base of the lungs which could be passive atelectasis, pneumonia, or aspiration. A follow-up study is recommended with full inspiration to see if this persists. No pulmonary edema. No pleural effusion. No pneumothorax. HEART AND MEDIASTINUM: There is mild cardiomegaly, with no evidence of CHF. Stable mediastinum with aortic atherosclerosis. BONES AND SOFT TISSUES: Moderate thoracic spondylosis. IMPRESSION: 1. Increased basilar lung opacity that may reflect passive atelectasis, pneumonia, or aspiration. 2. Recommend follow-up chest radiograph with full inspiration to assess persistence. 3. Mild cardiomegaly without evidence of congestive heart failure. Electronically signed by: Francis Quam MD 10/12/2024 05:24 AM EDT RP Workstation: [id number]    Procedures   Medications Ordered in the ED  alum & mag hydroxide-simeth (MAALOX/MYLANTA) 200-200-20 MG/5ML suspension 30 mL (30 mLs Oral Given 10/12/24 0505)  iohexol  (OMNIPAQUE ) 350 MG/ML injection 75 mL (75 mLs Intravenous Contrast Given 10/12/24  0530)     Medical Decision Making Amount and/or Complexity of Data Reviewed Labs: ordered. Radiology: ordered.  Risk OTC drugs. Prescription drug management.   This is a 83 year old presenting to ED due to complaint of chest pain and shortness of breath.  On arrival, he is hemodynamically stable.  He is afebrile and nontachycardic.  Lung sounds are clear bilaterally, no hypoxia.  Abdomen soft and compressible.  Neuroexam at baseline.  No edema to bilateral lower extremities.  Assessed with CBC, BMP, troponin, i-STAT Chem-8, chest x-ray, EKG.  Also added on CT angio of chest and pelvis assessing for dissection.  Patient reports chest pain radiates through to his back.  Patient did report that he received 2  nitroglycerin  with EMS and this resolved his chest pain.  Here his CBC is without leukocytosis, he is at baseline hemoglobin.  His metabolic panel is unremarkable.  His troponin is 10 and his delta is pending.  His EKG shows no signs of ischemia.  His chest x-ray shows increased basilar lung opacity that may reflect passive atelectasis, pneumonia or aspiration.  They recommend follow-up chest radiograph of ulceration to assess persistence.  Also noted to have mild cardiomegaly without evidence of CHF.  CT scan obtained which shows no acute findings.  There is no evidence of thoracoabdominal aortic aneurysm or dissection.  There is a small pericardial effusion.  He also has a 1.9 cm left thyroid  lobe nodule and they recommend ultrasound for further evaluation.  Patient also noted to have scattered bilateral pulmonary nodules.  At this time the patient requires a delta troponin.  Signed out to oncoming provider [employer].  Plan of management discussed.   Final diagnoses:  Thyroid  nodule  Chest pain, unspecified type    ED Discharge Orders     None          Ruthell Lonni JULIANNA DEVONNA 10/12/24 [id number]    Palumbo, April, MD 10/12/24 [phone]

## 2024-10-12 NOTE — Progress Notes (Signed)
 PHARMACY - ANTICOAGULATION CONSULT NOTE  Pharmacy Consult for heparin  Indication: chest pain/ACS  Allergies  Allergen Reactions   Codeine Nausea Only    Pill form causes nausea Iv form does not   Niaspan [Niacin Er (Antihyperlipidemic)] Other (See Comments)    Severe flushing   Neurontin  [Gabapentin ] Other (See Comments)    Dizziness Double vision   Statins Rash    Headaches     Patient Measurements: Height: 6' 1 (185.4 cm) Weight: 99.8 kg (220 lb) IBW/kg (Calculated) : 79.9 HEPARIN  DW (KG): 99.8  Vital Signs: Temp: 98.2 F (36.8 C) (11/01 1617) Temp Source: Oral (11/01 1617) BP: 136/67 (11/01 1656) Pulse Rate: 75 (11/01 1656)  Labs: Recent Labs    10/12/24 0438 10/12/24 0453 10/12/24 0632 10/12/24 1938  HGB 12.8* 12.9*  --   --   HCT 40.1 38.0*  --   --   PLT 268  --   --   --   HEPARINUNFRC  --   --   --  <0.10*  CREATININE 1.01 1.00  --   --   TROPONINIHS 10  --  13  --     Estimated Creatinine Clearance: 72 mL/min (by C-G formula based on SCr of 1 mg/dL).   Medical History: Past Medical History:  Diagnosis Date   Arthritis    back   CAD (coronary artery disease)    COPD (chronic obstructive pulmonary disease) (HCC)    Diverticulitis    Esophageal stricture    Gastric ulcer    GERD (gastroesophageal reflux disease)    Hyperlipemia    Hypertension    Kidney stone    MI (myocardial infarction) (HCC) 12/13/2003   Prostatic hypertrophy    S/P orchiectomy    Shortness of breath    Vertigo     Assessment: 88 YOM presenting with CP, hx CAD, he is not on anticoagulation PTA.  Heparin  level is undetectable, on 1300 units/hr. No s/sx of bleeding or infusion issues.   Goal of Therapy:  Heparin  level 0.3-0.7 units/ml Monitor platelets by anticoagulation protocol: Yes   Plan:  Heparin  2000 units x 1 Increase heparin  infusion to 1550 units/hr  F/u 8 hour heparin  level F/u cards recs  Thank you for allowing pharmacy to participate in this  patient's care,  Suzen Sour, PharmD, BCCCP Clinical Pharmacist  Phone: [phone] 10/12/2024 8:23 PM  Please check AMION for all [facility] phone numbers After 10:00 PM, call Main Pharmacy [phone]

## 2024-10-12 NOTE — H&P (Signed)
 Referring Physician: M. Levern, MD / Edsel Dade, MD / Nidia Mays, PA-C  Francisco Collier is an 83 y.o. male.                       Chief Complaint: Chest pain  HPI: 83 years old white male with PMH of CAD, S/P Angioplasty in 2005 and 2016, CVA, Positional vertigo, chronic back pain, diverticulosis, S/P sigmoid colon malignancy has central chest pain radiating to back. Chest pain resolved with SL NTG x 2 by EMS. His nausea was controlled by Zofran . EKG shows sinus rhythm with RBBB. CXR was suggestive of possible pneumonia but CT chest was negative for aortic dissection and pneumonia but positive for lung nodules and left thyroid  nodule.  Past Medical History:  Diagnosis Date   Arthritis    back   CAD (coronary artery disease)    COPD (chronic obstructive pulmonary disease) (HCC)    Diverticulitis    Esophageal stricture    Gastric ulcer    GERD (gastroesophageal reflux disease)    Hyperlipemia    Hypertension    Kidney stone    MI (myocardial infarction) (HCC) 12/13/2003   Prostatic hypertrophy    S/P orchiectomy    Shortness of breath    Vertigo       Past Surgical History:  Procedure Laterality Date   ABDOMINAL EXPOSURE N/A 07/25/2022   Procedure: ABDOMINAL EXPOSURE FOR ANTERIOR LUMBAR SPINE SURGERY;  Surgeon: Gretta Lonni PARAS, MD;  Location: [facility] OR;  Service: Vascular;  Laterality: N/A;   ANGIOPLASTY  2005   Stent   APPENDECTOMY     BACK SURGERY     CARDIAC CATHETERIZATION N/A 11/12/2015   Procedure: Left Heart Cath and Coronary Angiography;  Surgeon: Rober Levern, MD;  Location: [facility];  Service: Cardiovascular;  Laterality: N/A;   CHOLECYSTECTOMY     CHOLECYSTECTOMY     CORONARY ANGIOPLASTY     ESOPHAGOGASTRODUODENOSCOPY N/A 10/06/2020   Procedure: ESOPHAGOGASTRODUODENOSCOPY (EGD);  Surgeon: Albertus Gordy HERO, MD;  Location: [facility];  Service: Gastroenterology;  Laterality: N/A;   FOREIGN BODY REMOVAL  10/06/2020   Procedure: FOREIGN BODY  REMOVAL;  Surgeon: Albertus Gordy HERO, MD;  Location: [facility];  Service: Gastroenterology;;   THYROID  SURGERY      Family History  Problem Relation Age of Onset   Lung cancer Father    Colon cancer Neg Hx    Migraines Neg Hx    Social History:  reports that he quit smoking about 25 years ago. He started smoking about 65 years ago. He has never used smokeless tobacco. He reports that he does not drink alcohol and does not use drugs.  Allergies:  Allergies  Allergen Reactions   Codeine Nausea Only    Pill form causes nausea Iv form does not   Niaspan [Niacin Er (Antihyperlipidemic)] Other (See Comments)    Severe flushing   Neurontin  [Gabapentin ] Other (See Comments)    Dizziness Double vision   Statins Rash    Headaches     (Not in a hospital admission)   Results for orders placed or performed during the hospital encounter of 10/12/24 (from the past 48 hours)  Basic metabolic panel     Status: Abnormal   Collection Time: 10/12/24  4:38 AM  Result Value Ref Range   Sodium 136 135 - 145 mmol/L   Potassium 4.0 3.5 - 5.1 mmol/L   Chloride 98 98 - 111 mmol/L   CO2 25 22 -  32 mmol/L   Glucose, Bld 115 (H) 70 - 99 mg/dL    Comment: Glucose reference range applies only to samples taken after fasting for at least 8 hours.   BUN 10 8 - 23 mg/dL   Creatinine, Ser 8.98 0.61 - 1.24 mg/dL   Calcium  9.0 8.9 - 10.3 mg/dL   GFR, Estimated >39 >39 mL/min    Comment: (NOTE) Calculated using the CKD-EPI Creatinine Equation (2021)    Anion gap 13 5 - 15    Comment: Performed at [facility], [address], [place], [place] [address]  CBC     Status: Abnormal   Collection Time: 10/12/24  4:38 AM  Result Value Ref Range   WBC 7.5 4.0 - 10.5 K/uL   RBC 4.62 4.22 - 5.81 MIL/uL   Hemoglobin 12.8 (L) 13.0 - 17.0 g/dL   HCT 59.8 60.9 - 47.9 %   MCV 86.8 80.0 - 100.0 fL   MCH 27.7 26.0 - 34.0 pg   MCHC 31.9 30.0 - 36.0 g/dL   RDW 85.3 88.4 - 84.4 %   Platelets 268 150 - 400 K/uL    nRBC 0.0 0.0 - 0.2 %    Comment: Performed at [facility], [address], [place], [place] [address]  Troponin I (High Sensitivity)     Status: None   Collection Time: 10/12/24  4:38 AM  Result Value Ref Range   Troponin I (High Sensitivity) 10 <18 ng/L    Comment: (NOTE) Elevated high sensitivity troponin I (hsTnI) values and significant  changes across serial measurements may suggest ACS but many other  chronic and acute conditions are known to elevate hsTnI results.  Refer to the Links section for chest pain algorithms and additional  guidance. Performed at [facility], [address] [place], [place] [address]   I-stat chem 8, ED (not at [facility], [facility] or [facility])     Status: Abnormal   Collection Time: 10/12/24  4:53 AM  Result Value Ref Range   Sodium 137 135 - 145 mmol/L   Potassium 4.0 3.5 - 5.1 mmol/L   Chloride 99 98 - 111 mmol/L   BUN 11 8 - 23 mg/dL   Creatinine, Ser 8.99 0.61 - 1.24 mg/dL   Glucose, Bld 887 (H) 70 - 99 mg/dL    Comment: Glucose reference range applies only to samples taken after fasting for at least 8 hours.   Calcium , Ion 1.22 1.15 - 1.40 mmol/L   TCO2 25 22 - 32 mmol/L   Hemoglobin 12.9 (L) 13.0 - 17.0 g/dL   HCT 61.9 (L) 60.9 - 47.9 %  Troponin I (High Sensitivity)     Status: None   Collection Time: 10/12/24  6:32 AM  Result Value Ref Range   Troponin I (High Sensitivity) 13 <18 ng/L    Comment: (NOTE) Elevated high sensitivity troponin I (hsTnI) values and significant  changes across serial measurements may suggest ACS but many other  chronic and acute conditions are known to elevate hsTnI results.  Refer to the Links section for chest pain algorithms and additional  guidance. Performed at [facility], [address], [place], [place] [address]    CT Angio Chest/Abd/Pel for Dissection W and/or Wo Contrast Result Date: 10/12/2024 CLINICAL DATA:  Acute chest pain, shortness of breath, and nausea. Acute aortic syndrome.  EXAM: CT ANGIOGRAPHY CHEST, ABDOMEN AND PELVIS TECHNIQUE: Non-contrast CT of the chest was initially obtained. Multidetector CT imaging through the  chest, abdomen and pelvis was performed using the standard protocol during bolus administration of intravenous contrast. Multiplanar reconstructed images and MIPs were obtained and reviewed to evaluate the vascular anatomy. RADIATION DOSE REDUCTION: This exam was performed according to the departmental dose-optimization program which includes automated exposure control, adjustment of the mA and/or kV according to patient size and/or use of iterative reconstruction technique. CONTRAST:  75mL OMNIPAQUE  IOHEXOL  350 MG/ML SOLN COMPARISON:  AP only CT on 01/17/2022 FINDINGS: CTA CHEST FINDINGS Cardiovascular: No evidence of mediastinal hematoma or pericardial effusion. No evidence of thoracic aortic dissection or aneurysm. No signs of penetrating atherosclerotic ulcer or intramural hematoma. Small pericardial effusion noted. Aortic and coronary atherosclerotic calcification also seen. No pulmonary emboli identified. Mediastinum/Nodes: 1.9 cm left thyroid  lobe nodule noted. Prior right thyroidectomy. No pathologically enlarged lymph nodes identified. Lungs/Pleura: No pulmonary mass, infiltrate, or effusion. Several scattered sub-cm pulmonary nodules are seen in the left upper and right lower lobes measuring up to 4 mm. Mild centrilobular emphysema noted. Musculoskeletal: No suspicious bone lesions identified. Review of the MIP images confirms the above findings. CTA ABDOMEN AND PELVIS FINDINGS VASCULAR Aorta: Normal caliber aorta without aneurysm, dissection, vasculitis or significant stenosis. Aortic atherosclerotic calcification noted. Celiac: Patent without evidence of aneurysm, dissection, vasculitis or significant stenosis. SMA: Patent without evidence of aneurysm, dissection, vasculitis or significant stenosis. Renals: Both renal arteries are patent without evidence of  aneurysm, dissection, vasculitis, fibromuscular dysplasia or significant stenosis. IMA: Patent without evidence of aneurysm, dissection, vasculitis or significant stenosis. Inflow: Patent without evidence of aneurysm, dissection, vasculitis or significant stenosis. Veins: No obvious venous abnormality within the limitations of this arterial phase study. Review of the MIP images confirms the above findings. NON-VASCULAR Hepatobiliary: No suspicious hepatic masses identified. Prior cholecystectomy. No evidence of biliary obstruction. Pancreas:  No mass or inflammatory changes. Spleen: Within normal limits in size and appearance. Adrenals/Urinary Tract: No suspicious masses identified. 3 mm nonobstructing calculus noted in lower pole of left kidney. No evidence of ureteral calculi or hydronephrosis. Stomach/Bowel: No evidence of obstruction, inflammatory process or abnormal fluid collections. Sigmoid diverticulosis, without signs of diverticulitis. Vascular/Lymphatic: No pathologically enlarged lymph nodes. No acute vascular findings. Reproductive:  Mildly enlarged prostate. Other:  None. Musculoskeletal: No suspicious bone lesions identified. Lumbar spine fusion hardware noted. IMPRESSION: No acute findings. No evidence of thoracoabdominal aortic aneurysm or dissection. Small pericardial effusion. 1.9 cm left thyroid  lobe nodule. Recommend further evaluation with thyroid  US . (ref: J Am Coll Radiol. 2015 Feb;12(2): 143-50). Scattered bilateral pulmonary nodules measuring up to 4 mm. No follow-up needed if patient is low-risk (and has no known or suspected primary neoplasm). Non-contrast chest CT can be considered in 12 months if patient is high-risk. This recommendation follows the consensus statement: Guidelines for Management of Incidental Pulmonary Nodules Detected on CT Images: From the Fleischner Society 2017; Radiology 2017; 284:228-243. Tiny nonobstructing left renal calculus. No evidence of ureteral calculi or  hydronephrosis. Sigmoid diverticulosis, without radiographic evidence of diverticulitis. Mildly enlarged prostate. Electronically Signed   By: Norleen DELENA Kil M.D.   On: 10/12/2024 06:06   DG Chest Port 1 View Result Date: 10/12/2024 EXAM: 1 VIEW(S) XRAY OF THE CHEST 10/12/2024 04:52:18 AM COMPARISON: AP and lateral chest 07/03/2023. CLINICAL HISTORY: cp, sob FINDINGS: LUNGS AND PLEURA: The lungs are expiratory. There is increased opacity in the base of the lungs which could be passive atelectasis, pneumonia, or aspiration. A follow-up study is recommended with full inspiration to see if this persists. No pulmonary edema. No pleural effusion.  No pneumothorax. HEART AND MEDIASTINUM: There is mild cardiomegaly, with no evidence of CHF. Stable mediastinum with aortic atherosclerosis. BONES AND SOFT TISSUES: Moderate thoracic spondylosis. IMPRESSION: 1. Increased basilar lung opacity that may reflect passive atelectasis, pneumonia, or aspiration. 2. Recommend follow-up chest radiograph with full inspiration to assess persistence. 3. Mild cardiomegaly without evidence of congestive heart failure. Electronically signed by: Francis Quam MD 10/12/2024 05:24 AM EDT RP Workstation: [id number]    Review Of Systems Constitutional: No fever, chills, weight loss or gain. Eyes: No vision change, wears glasses. No discharge or pain. Ears: No hearing loss, No tinnitus. Respiratory: No asthma, COPD, pneumonias. Positive shortness of breath. No hemoptysis. Cardiovascular: Positive chest pain, no palpitation, leg edema. Gastrointestinal: No nausea, vomiting, diarrhea, constipation. No GI bleed. No hepatitis. Genitourinary: No dysuria, hematuria, kidney stone. No incontinance. Neurological: No headache, stroke, seizures.  Psychiatry: No psych facility admission for anxiety, depression, suicide. No detox. Skin: No rash. Musculoskeletal: No joint pain, fibromyalgia. No neck pain, back pain. Lymphadenopathy: No  lymphadenopathy. Hematology: No anemia or easy bruising.   Blood pressure 121/65, pulse 84, temperature 97.8 F (36.6 C), temperature source Oral, resp. rate (!) 25, height 6' 1 (1.854 m), weight 99.8 kg, SpO2 90%. Body mass index is 29.03 kg/m. General appearance: alert, cooperative, appears stated age and no distress Head: Normocephalic, atraumatic. Eyes: Blue eyes, pink conjunctiva, corneas clear.  Neck: No adenopathy, no carotid bruit, no JVD, supple, symmetrical, trachea midline and thyroid  not enlarged. Resp: Clear to auscultation bilaterally. Cardio: Regular rate and rhythm, S1, S2 normal, II/VI systolic murmur, no click, rub or gallop GI: Soft, non-tender; bowel sounds normal; no organomegaly. Extremities: No edema, cyanosis or clubbing. Skin: Warm and dry.  Neurologic: Alert and oriented X 3, normal strength. Normal coordination and gait.  Assessment/Plan Acute coronary syndrome CAD S/P Stenting to LAD and RCA Chronic small pericardial effusion HTN HLD COPD Bilateral pulmonary nodules Left thyroid  nodules S/P Colon cancer S/P thyroid  surgery  Plan: Admit. IV heparin . Home medications. Echocardiogram for LV function and pericardial effusion.  Time spent: Review of old records, Lab, x-rays, EKG, other cardiac tests, examination, discussion with patient/PA/Nurse over 70 minutes.  Salena GORMAN Negri, MD  10/12/2024, 9:29 AM

## 2024-10-12 NOTE — ED Notes (Signed)
 Patient transported to CT

## 2024-10-12 NOTE — Progress Notes (Signed)
  Echocardiogram 2D Echocardiogram has been performed.  Francisco Collier 10/12/2024, 3:36 PM

## 2024-10-12 NOTE — ED Provider Notes (Signed)
  Accepted handoff at shift change from Groce PA-C. Please see prior provider note for more detail.   Briefly: Patient is 83 y.o. Presents to ED complaining of chest pain which radiates to back. Reports that he was attempting go to bed this evening when he had a sudden onset of sharp chest pain in his central chest which radiates to his back. He states the back pain is located in the middle of his back and he denies a history of this. He states that initially he was short of breath but after EMS arrived and gave him 2 doses of nitroglycerin  his shortness of breath as well as the chest pain resolved. Initially reports that chest pain was rated 8 out of 10 but after 2 nitroglycerin  reduced to 2 out of 10. He denies that he is short of breath anymore. He endorses nausea but denies any vomiting. States that after he received Zofran  with EMS his nausea resolved. Denies abdominal pain. Endorsing dizziness and lightheadedness on standing but denies any weakness. Denies any blood in stool. Reports compliance on blood thinning medication. Denies fevers at home. 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    Plan:  - dispo pending delta trop and cardiology consult.  - patient with extensive cardiac hx. - 2005: anteroseptal wall myocardial infarction status post PTCA stenting to LAD - 2016: cardiac catheterization/PTCA stenting to proximal and mid RCA with excellent angiographic results.  - delta troponin reassuring. EKG without ST changes. CT showing small pericardial effusion. Presentation today similar to prior MI. - consulted with Cardiologist on-call Dr. Claudene who agrees to evaluate patient in ED before deciding further plan. - Cardiology admitting patient.     Hoy Nidia FALCON, [place] 10/12/24 [id number]    Nettie Earing, MD 10/12/24 [phone]

## 2024-10-12 NOTE — Plan of Care (Signed)

## 2024-10-12 NOTE — Progress Notes (Signed)
 PHARMACY - ANTICOAGULATION CONSULT NOTE  Pharmacy Consult for heparin  Indication: chest pain/ACS  Allergies  Allergen Reactions   Codeine Nausea Only    Pill form causes nausea Iv form does not   Niaspan [Niacin Er (Antihyperlipidemic)] Other (See Comments)    Severe flushing   Neurontin  [Gabapentin ] Other (See Comments)    Dizziness Double vision   Statins Rash    Headaches     Patient Measurements: Height: 6' 1 (185.4 cm) Weight: 99.8 kg (220 lb) IBW/kg (Calculated) : 79.9 HEPARIN  DW (KG): 99.8  Vital Signs: Temp: 97.8 F (36.6 C) (11/01 0836) Temp Source: Oral (11/01 0836) BP: 128/62 (11/01 0930) Pulse Rate: 80 (11/01 0938)  Labs: Recent Labs    10/12/24 0438 10/12/24 0453 10/12/24 0632  HGB 12.8* 12.9*  --   HCT 40.1 38.0*  --   PLT 268  --   --   CREATININE 1.01 1.00  --   TROPONINIHS 10  --  13    Estimated Creatinine Clearance: 72 mL/min (by C-G formula based on SCr of 1 mg/dL).   Medical History: Past Medical History:  Diagnosis Date   Arthritis    back   CAD (coronary artery disease)    COPD (chronic obstructive pulmonary disease) (HCC)    Diverticulitis    Esophageal stricture    Gastric ulcer    GERD (gastroesophageal reflux disease)    Hyperlipemia    Hypertension    Kidney stone    MI (myocardial infarction) (HCC) 12/13/2003   Prostatic hypertrophy    S/P orchiectomy    Shortness of breath    Vertigo     Assessment: 81 YOM presenting with CP, hx CAD, he is not on anticoagulation PTA  Goal of Therapy:  Heparin  level 0.3-0.7 units/ml Monitor platelets by anticoagulation protocol: Yes   Plan:  Heparin  4000 units Iv x 1, and gtt at 1300 units/hr F/u 8 hour heparin  level F/u cards recs  Dorn Poot, PharmD, [facility] Clinical Pharmacist ED Pharmacist Phone # [phone] 10/12/2024 10:17 AM

## 2024-10-12 NOTE — ED Notes (Signed)
 Paged Dr. Levern for Cardiology

## 2024-10-12 NOTE — Discharge Instructions (Addendum)
 You are noted to have thyroid  nodule on your CT scan.  Please follow-up with your PCP for ultrasound.

## 2024-10-12 NOTE — ED Triage Notes (Signed)
 PT BIB EMS from home. Pt reports CP began around mignight before going to bed, center chest that radiates to back between shoulder blades, 8/10. Initially reported SHOB and nausea, denies SHOB or nausea at this time. C/o dizziness. A&Ox4. Hx back surgery and mi, baseline numbness in ble  EMS admin 324 Asprin, 4mg  zofran  .4 nitroglycerin  SL x2, pt reported reliefe of CP  EMS VS 91% RA, 95% 4L [place], HR 70s, 186/80 initial, 139/70 most recent

## 2024-10-13 LAB — BASIC METABOLIC PANEL WITH GFR
Anion gap: 9 (ref 5–15)
BUN: 11 mg/dL (ref 8–23)
CO2: 28 mmol/L (ref 22–32)
Calcium: 8.8 mg/dL — ABNORMAL LOW (ref 8.9–10.3)
Chloride: 101 mmol/L (ref 98–111)
Creatinine, Ser: 1.02 mg/dL (ref 0.61–1.24)
GFR, Estimated: >60 mL/min (ref >60)
Glucose, Bld: 99 mg/dL (ref 70–99)
Potassium: 4.6 mmol/L (ref 3.5–5.1)
Sodium: 138 mmol/L (ref 135–145)

## 2024-10-13 LAB — HEPARIN LEVEL (UNFRACTIONATED)
Heparin Unfractionated: 0.32 [IU]/mL (ref 0.30–0.70)
Heparin Unfractionated: 0.26 [IU]/mL — ABNORMAL LOW (ref 0.30–0.70)

## 2024-10-13 LAB — LIPID PANEL
Cholesterol: 97 mg/dL (ref 0–200)
HDL: 30 mg/dL — ABNORMAL LOW (ref >40)
LDL Cholesterol: 46 mg/dL (ref 0–99)
Total CHOL/HDL Ratio: 3.2 ratio
Triglycerides: 103 mg/dL (ref <150)
VLDL: 21 mg/dL (ref 0–40)

## 2024-10-13 LAB — CBC
HCT: 40.2 % (ref 39.0–52.0)
Hemoglobin: 12.8 g/dL — ABNORMAL LOW (ref 13.0–17.0)
MCH: 27.9 pg (ref 26.0–34.0)
MCHC: 31.8 g/dL (ref 30.0–36.0)
MCV: 87.8 fL (ref 80.0–100.0)
Platelets: 245 K/uL (ref 150–400)
RBC: 4.58 MIL/uL (ref 4.22–5.81)
RDW: 15 % (ref 11.5–15.5)
WBC: 6.2 K/uL (ref 4.0–10.5)
nRBC: 0 % (ref 0.0–0.2)

## 2024-10-13 LAB — ECHOCARDIOGRAM COMPLETE
Height: 73 in
S' Lateral: 4 cm
Single Plane A4C EF: 50.4 %
Weight: 3520 [oz_av]

## 2024-10-13 MED ORDER — TRAMADOL HCL 50 MG PO TABS
50.0000 mg | ORAL_TABLET | Freq: Two times a day (BID) | ORAL | Status: DC | PRN
  Administered 2024-10-13 – 2024-10-15 (×3): 50 mg via ORAL
  Filled 2024-10-13 (×3): qty 1

## 2024-10-13 MED ORDER — CALCIUM CARBONATE 1250 (500 CA) MG PO TABS
1.0000 | ORAL_TABLET | Freq: Every day | ORAL | Status: DC
  Administered 2024-10-13 – 2024-10-15 (×3): 1250 mg via ORAL
  Filled 2024-10-13 (×3): qty 1

## 2024-10-13 MED ORDER — TORSEMIDE 20 MG PO TABS
20.0000 mg | ORAL_TABLET | Freq: Once | ORAL | Status: AC
  Administered 2024-10-13: 20 mg via ORAL
  Filled 2024-10-13: qty 1

## 2024-10-13 NOTE — Progress Notes (Signed)
 PHARMACY - ANTICOAGULATION CONSULT NOTE  Pharmacy Consult for heparin  Indication: chest pain/ACS  Allergies  Allergen Reactions   Codeine Nausea Only    Pill form causes nausea Iv form does not   Niaspan [Niacin Er (Antihyperlipidemic)] Other (See Comments)    Severe flushing   Neurontin  [Gabapentin ] Other (See Comments)    Dizziness Double vision   Statins Rash    Headaches     Patient Measurements: Height: 6' 1 (185.4 cm) Weight: 96.7 kg (213 lb 3.2 oz) IBW/kg (Calculated) : 79.9 HEPARIN  DW (KG): 99.8  Vital Signs: Temp: 98.2 F (36.8 C) (11/02 1243) Temp Source: Oral (11/02 1243) BP: 115/62 (11/02 1243) Pulse Rate: 78 (11/02 1243)  Labs: Recent Labs    10/12/24 0438 10/12/24 0453 10/12/24 0632 10/12/24 1938 10/13/24 0531 10/13/24 1619  HGB 12.8* 12.9*  --   --  12.8*  --   HCT 40.1 38.0*  --   --  40.2  --   PLT 268  --   --   --  245  --   HEPARINUNFRC  --   --   --  <0.10* 0.32 0.26*  CREATININE 1.01 1.00  --   --  1.02  --   TROPONINIHS 10  --  13  --   --   --     Estimated Creatinine Clearance: 69.6 mL/min (by C-G formula based on SCr of 1.02 mg/dL).   Medical History: Past Medical History:  Diagnosis Date   Arthritis    back   CAD (coronary artery disease)    COPD (chronic obstructive pulmonary disease) (HCC)    Diverticulitis    Esophageal stricture    Gastric ulcer    GERD (gastroesophageal reflux disease)    Hyperlipemia    Hypertension    Kidney stone    MI (myocardial infarction) (HCC) 12/13/2003   Prostatic hypertrophy    S/P orchiectomy    Shortness of breath    Vertigo     Assessment: 69 YOM presenting with CP, hx CAD, he is not on anticoagulation PTA.  Heparin  level is subtherapeutic at 0.26, on 1550 units/hr. No s/sx of bleeding or infusion issues.   Goal of Therapy:  Heparin  level 0.3-0.7 units/ml Monitor platelets by anticoagulation protocol: Yes   Plan:  Increase heparin  infusion to 1750 units/hr  F/u 8 hour  heparin  level with AM labs  F/u cards recs  Thank you for allowing pharmacy to participate in this patient's care,  Suzen Sour, PharmD, BCCCP Clinical Pharmacist  Phone: [phone] 10/13/2024 5:32 PM  Please check AMION for all [facility]acy phone numbers After 10:00 PM, call Main Pharmacy [phone]

## 2024-10-13 NOTE — Progress Notes (Signed)
 Ref: Barbra Odor, NP   Subjective:  Borderline oxygen level on room air. VS otherwise near normal. Elevated diastolic BP.  SR with RBBB. Echocardiogram with mild LV systolic and diastolic dysfunction.  Objective:  Vital Signs in the last 24 hours: Temp:  [97.6 F (36.4 C)-98.4 F (36.9 C)] 97.6 F (36.4 C) (11/02 0636) Pulse Rate:  [53-86] 77 (11/02 1000) Cardiac Rhythm: Normal sinus rhythm;Heart block (11/02 0713) Resp:  [17-20] 17 (11/02 0816) BP: (108-162)/(45-105) 133/105 (11/02 0952) SpO2:  [74 %-97 %] 97 % (11/02 1000) Weight:  [96.7 kg] 96.7 kg (11/02 0636)  Physical Exam: BP Readings from Last 1 Encounters:  10/13/24 (!) 133/105     Wt Readings from Last 1 Encounters:  10/13/24 96.7 kg    Weight change: -3.084 kg Body mass index is 28.13 kg/m. HEENT: [place]/AT, Eyes-Blue, wears glasses, Conjunctiva-Pink, Sclera-Non-icteric Neck: No JVD, No bruit, Trachea midline. Lungs:  Clearing, Bilateral. Cardiac:  Regular rhythm, normal S1 and S2, no S3. II/VI systolic murmur. Abdomen:  Soft, non-tender. BS present. Extremities:  No edema present. No cyanosis. No clubbing. CNS: AxOx3, Cranial nerves grossly intact, moves all 4 extremities.  Skin: Warm and dry.   Intake/Output from previous day: 11/01 0701 - 11/02 0700 In: 732 [P.O.:500; I.V.:232] Out: 1050 [Urine:1050]    Lab Results: BMET    Component Value Date/Time   NA 138 10/13/2024 0531   NA 137 10/12/2024 0453   NA 136 10/12/2024 0438   K 4.6 10/13/2024 0531   K 4.0 10/12/2024 0453   K 4.0 10/12/2024 0438   CL 101 10/13/2024 0531   CL 99 10/12/2024 0453   CL 98 10/12/2024 0438   CO2 28 10/13/2024 0531   CO2 25 10/12/2024 0438   CO2 27 03/11/2024 2335   GLUCOSE 99 10/13/2024 0531   GLUCOSE 112 (H) 10/12/2024 0453   GLUCOSE 115 (H) 10/12/2024 0438   BUN 11 10/13/2024 0531   BUN 11 10/12/2024 0453   BUN 10 10/12/2024 0438   CREATININE 1.02 10/13/2024 0531   CREATININE 1.00 10/12/2024 0453    CREATININE 1.01 10/12/2024 0438   CALCIUM  8.8 (L) 10/13/2024 0531   CALCIUM  9.0 10/12/2024 0438   CALCIUM  8.8 (L) 03/11/2024 2335   GFRNONAA >60 10/13/2024 0531   GFRNONAA >60 10/12/2024 0438   GFRNONAA >60 03/11/2024 2335   GFRAA >60 11/27/2016 2351   GFRAA >60 11/13/2015 0545   GFRAA >60 11/12/2015 0630   CBC    Component Value Date/Time   WBC 6.2 10/13/2024 0531   RBC 4.58 10/13/2024 0531   HGB 12.8 (L) 10/13/2024 0531   HCT 40.2 10/13/2024 0531   PLT 245 10/13/2024 0531   MCV 87.8 10/13/2024 0531   MCH 27.9 10/13/2024 0531   MCHC 31.8 10/13/2024 0531   RDW 15.0 10/13/2024 0531   LYMPHSABS 2.0 03/11/2024 2335   MONOABS 0.7 03/11/2024 2335   EOSABS 0.1 03/11/2024 2335   BASOSABS 0.0 03/11/2024 2335   HEPATIC Function Panel Recent Labs    03/11/24 2335  PROT 6.3*  ALBUMIN  3.5  AST 14*  ALT 15  ALKPHOS 69   HEMOGLOBIN A1C Lab Results  Component Value Date   MPG 85.32 02/14/2022   CARDIAC ENZYMES Lab Results  Component Value Date   CKTOTAL 56 07/25/2011   CKMB 1.5 07/25/2011   TROPONINI <0.03 11/12/2015   TROPONINI <0.03 11/12/2015   TROPONINI <0.03 11/12/2015   BNP No results for input(s): PROBNP in the last 8760 hours. TSH No results for input(s):  TSH in the last 8760 hours. CHOLESTEROL Recent Labs    10/13/24 0531  CHOL 97    Scheduled Meds:  aspirin  EC  81 mg Oral Daily   calcium  carbonate  1 tablet Oral Daily   clopidogrel   75 mg Oral Q breakfast   lisinopril   10 mg Oral Daily   metoprolol  tartrate  12.5 mg Oral BID   sodium chloride  flush  3 mL Intravenous Q12H   torsemide  20 mg Oral Once   Continuous Infusions:  heparin  1,550 Units/hr (10/13/24 0041)   PRN Meds:.acetaminophen , budesonide -glycopyrrolate-formoterol , fluticasone , LORazepam , meclizine , nitroGLYCERIN , ondansetron  (ZOFRAN ) IV, ondansetron , pantoprazole , sodium chloride  flush  Assessment/Plan: Acute coronary syndrome CAD S/P Stenting to LAD and RCA Chronic small  pericardial effusion HTN HLD COPD Bilateral pulmonary nodules Left thyroid  nodules S/P Colon cancer S/P thyroid  surgery  Plan: One dose Torsemide. Schedule NM myocardial perfusion stress test prior to cardiac cath and patient in agreement.   LOS: 1 day   Time spent including chart review, lab review, examination, discussion with patient/Friend/Nurse : 30 min   Salena Negri  MD  10/13/2024, 12:00 PM

## 2024-10-13 NOTE — Progress Notes (Signed)
 PHARMACY - ANTICOAGULATION CONSULT NOTE  Pharmacy Consult for heparin  Indication: chest pain/ACS  Allergies  Allergen Reactions   Codeine Nausea Only    Pill form causes nausea Iv form does not   Niaspan [Niacin Er (Antihyperlipidemic)] Other (See Comments)    Severe flushing   Neurontin  [Gabapentin ] Other (See Comments)    Dizziness Double vision   Statins Rash    Headaches     Patient Measurements: Height: 6' 1 (185.4 cm) Weight: 96.7 kg (213 lb 3.2 oz) IBW/kg (Calculated) : 79.9 HEPARIN  DW (KG): 99.8  Vital Signs: Temp: 97.6 F (36.4 C) (11/02 0636) Temp Source: Oral (11/02 0636) BP: 109/45 (11/02 0005) Pulse Rate: 81 (11/01 2010)  Labs: Recent Labs    10/12/24 0438 10/12/24 0453 10/12/24 0632 10/12/24 1938 10/13/24 0531  HGB 12.8* 12.9*  --   --  12.8*  HCT 40.1 38.0*  --   --  40.2  PLT 268  --   --   --  245  HEPARINUNFRC  --   --   --  <0.10* 0.32  CREATININE 1.01 1.00  --   --  1.02  TROPONINIHS 10  --  13  --   --     Estimated Creatinine Clearance: 69.6 mL/min (by C-G formula based on SCr of 1.02 mg/dL).   Medical History: Past Medical History:  Diagnosis Date   Arthritis    back   CAD (coronary artery disease)    COPD (chronic obstructive pulmonary disease) (HCC)    Diverticulitis    Esophageal stricture    Gastric ulcer    GERD (gastroesophageal reflux disease)    Hyperlipemia    Hypertension    Kidney stone    MI (myocardial infarction) (HCC) 12/13/2003   Prostatic hypertrophy    S/P orchiectomy    Shortness of breath    Vertigo     Assessment: 17 YOM presenting with CP, hx CAD, he is not on anticoagulation PTA.  Heparin  level is undetectable, on 1300 units/hr. No s/sx of bleeding or infusion issues.   11/02 AM: Heparin  level therapeutic after bolus and at a rate of 1550 units/hr. No s/sx of bleeding reported. CBC stable.  Goal of Therapy:  Heparin  level 0.3-0.7 units/ml Monitor platelets by anticoagulation protocol: Yes    Plan:  Continue heparin  infusion to 1550 units/hr  F/u 8 hour heparin  level F/u cards recs  Thank you for allowing pharmacy to participate in this patient's care,  R. Samual Satterfield, PharmD PGY-1 Acute Care Pharmacy Resident [facility] Please refer to [facility] for [facility]acy numbers 10/13/2024 7:43 AM

## 2024-10-13 NOTE — Plan of Care (Signed)

## 2024-10-14 ENCOUNTER — Inpatient Hospital Stay (HOSPITAL_COMMUNITY)

## 2024-10-14 ENCOUNTER — Encounter (HOSPITAL_COMMUNITY): Payer: Self-pay | Admitting: Cardiovascular Disease

## 2024-10-14 LAB — CBC
HCT: 39.8 % (ref 39.0–52.0)
Hemoglobin: 12.7 g/dL — ABNORMAL LOW (ref 13.0–17.0)
MCH: 27.9 pg (ref 26.0–34.0)
MCHC: 31.9 g/dL (ref 30.0–36.0)
MCV: 87.3 fL (ref 80.0–100.0)
Platelets: 265 K/uL (ref 150–400)
RBC: 4.56 MIL/uL (ref 4.22–5.81)
RDW: 14.8 % (ref 11.5–15.5)
WBC: 6.8 K/uL (ref 4.0–10.5)
nRBC: 0 % (ref 0.0–0.2)

## 2024-10-14 LAB — HEPARIN LEVEL (UNFRACTIONATED): Heparin Unfractionated: 0.45 [IU]/mL (ref 0.30–0.70)

## 2024-10-14 LAB — BASIC METABOLIC PANEL WITH GFR
Anion gap: 11 (ref 5–15)
BUN: 16 mg/dL (ref 8–23)
CO2: 27 mmol/L (ref 22–32)
Calcium: 9 mg/dL (ref 8.9–10.3)
Chloride: 99 mmol/L (ref 98–111)
Creatinine, Ser: 1.03 mg/dL (ref 0.61–1.24)
GFR, Estimated: >60 mL/min (ref >60)
Glucose, Bld: 93 mg/dL (ref 70–99)
Potassium: 3.7 mmol/L (ref 3.5–5.1)
Sodium: 137 mmol/L (ref 135–145)

## 2024-10-14 MED ORDER — REGADENOSON 0.4 MG/5ML IV SOLN
0.4000 mg | Freq: Once | INTRAVENOUS | Status: AC
  Administered 2024-10-14: 0.4 mg via INTRAVENOUS

## 2024-10-14 MED ORDER — TECHNETIUM TC 99M TETROFOSMIN IV KIT
10.0000 | PACK | Freq: Once | INTRAVENOUS | Status: AC | PRN
  Administered 2024-10-14: 10.02 via INTRAVENOUS

## 2024-10-14 MED ORDER — TECHNETIUM TC 99M TETROFOSMIN IV KIT
30.0000 | PACK | Freq: Once | INTRAVENOUS | Status: AC | PRN
  Administered 2024-10-14: 27 via INTRAVENOUS

## 2024-10-14 MED ORDER — HEPARIN SODIUM (PORCINE) 5000 UNIT/ML IJ SOLN
5000.0000 [IU] | Freq: Three times a day (TID) | INTRAMUSCULAR | Status: DC
  Administered 2024-10-15: 5000 [IU] via SUBCUTANEOUS
  Filled 2024-10-14: qty 1

## 2024-10-14 MED ORDER — REGADENOSON 0.4 MG/5ML IV SOLN
INTRAVENOUS | Status: AC
  Filled 2024-10-14: qty 5

## 2024-10-14 NOTE — Plan of Care (Signed)

## 2024-10-14 NOTE — Progress Notes (Signed)
 Subjective:  Seen in the nuclear medicine department just completed a nuclear stress test tolerated it well nuclear scan results not pending patient remains on IV heparin .  Denies any chest pain or shortness of breath  Objective:  Vital Signs in the last 24 hours: Temp:  [97.5 F (36.4 C)-98.2 F (36.8 C)] 97.5 F (36.4 C) (11/03 1100) Pulse Rate:  [65-75] 73 (11/03 1100) Resp:  [12-20] 18 (11/03 1415) BP: (104-141)/(41-73) 141/63 (11/03 1415) SpO2:  [94 %-100 %] 100 % (11/03 1100) Weight:  [95.8 kg] 95.8 kg (11/03 0500)  Intake/Output from previous day: 11/02 0701 - 11/03 0700 In: 693 [P.O.:237; I.V.:456] Out: 500 [Urine:500] Intake/Output from this shift: No intake/output data recorded.  Physical Exam: Neck: no adenopathy, no carotid bruit, no JVD, and supple, symmetrical, trachea midline Lungs: clear to auscultation bilaterally Heart: regular rate and rhythm, S1, S2 normal, and 2/6 systolic murmur noted Abdomen: soft, non-tender; bowel sounds normal; no masses,  no organomegaly Extremities: extremities normal, atraumatic, no cyanosis or edema  Lab Results: Recent Labs    10/13/24 0531 10/14/24 0434  WBC 6.2 6.8  HGB 12.8* 12.7*  PLT 245 265   Recent Labs    10/13/24 0531 10/14/24 0434  NA 138 137  K 4.6 3.7  CL 101 99  CO2 28 27  GLUCOSE 99 93  BUN 11 16  CREATININE 1.02 1.03   No results for input(s): TROPONINI in the last 72 hours.  Invalid input(s): CK, MB Hepatic Function Panel No results for input(s): PROT, ALBUMIN , AST, ALT, ALKPHOS, BILITOT, BILIDIR, IBILI in the last 72 hours. Recent Labs    10/13/24 0531  CHOL 97   No results for input(s): PROTIME in the last 72 hours.  Imaging: Imaging results have been reviewed and No results found.  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

## 2024-10-14 NOTE — Progress Notes (Signed)
 PHARMACY - ANTICOAGULATION CONSULT NOTE  Pharmacy Consult for heparin  Indication: chest pain/ACS  Allergies  Allergen Reactions   Codeine Nausea Only    Pill form causes nausea Iv form does not   Niaspan [Niacin Er (Antihyperlipidemic)] Other (See Comments)    Severe flushing   Neurontin  [Gabapentin ] Other (See Comments)    Dizziness Double vision   Statins Rash    Headaches     Patient Measurements: Height: 6' 1 (185.4 cm) Weight: 95.8 kg (211 lb 1.6 oz) IBW/kg (Calculated) : 79.9 HEPARIN  DW (KG): 99.8  Vital Signs: Temp: 98.1 F (36.7 C) (11/03 0504) Temp Source: Oral (11/03 0504) BP: 117/67 (11/03 0504) Pulse Rate: 70 (11/03 0504)  Labs: Recent Labs    10/12/24 0438 10/12/24 0453 10/12/24 0632 10/12/24 1938 10/13/24 0531 10/13/24 1619 10/14/24 0434  HGB 12.8* 12.9*  --   --  12.8*  --  12.7*  HCT 40.1 38.0*  --   --  40.2  --  39.8  PLT 268  --   --   --  245  --  265  HEPARINUNFRC  --   --   --    < > 0.32 0.26* 0.45  CREATININE 1.01 1.00  --   --  1.02  --  1.03  TROPONINIHS 10  --  13  --   --   --   --    < > = values in this interval not displayed.    Estimated Creatinine Clearance: 63.6 mL/min (by C-G formula based on SCr of 1.03 mg/dL).   Medical History: Past Medical History:  Diagnosis Date   Arthritis    back   CAD (coronary artery disease)    COPD (chronic obstructive pulmonary disease) (HCC)    Diverticulitis    Esophageal stricture    Gastric ulcer    GERD (gastroesophageal reflux disease)    Hyperlipemia    Hypertension    Kidney stone    MI (myocardial infarction) (HCC) 12/13/2003   Prostatic hypertrophy    S/P orchiectomy    Shortness of breath    Vertigo     Assessment: 36 YOM presenting with CP, hx CAD, he is not on anticoagulation PTA. Pharmacy dosing IV heparin  for ACS.  Heparin  level is therapeutic, CBC stable. Planning nuc stress test.  Goal of Therapy:  Heparin  level 0.3-0.7 units/ml Monitor platelets by  anticoagulation protocol: Yes   Plan:  Continue heparin  1750 units/h Daily heparin  level and CBC  Ozell Jamaica, PharmD, BCPS, [facility] Clinical Pharmacist [phone] Please check AMION for all [facility] Pharmacy numbers 10/14/2024

## 2024-10-15 LAB — CBC
HCT: 38.8 % — ABNORMAL LOW (ref 39.0–52.0)
Hemoglobin: 12.6 g/dL — ABNORMAL LOW (ref 13.0–17.0)
MCH: 28.1 pg (ref 26.0–34.0)
MCHC: 32.5 g/dL (ref 30.0–36.0)
MCV: 86.6 fL (ref 80.0–100.0)
Platelets: 257 K/uL (ref 150–400)
RBC: 4.48 MIL/uL (ref 4.22–5.81)
RDW: 14.8 % (ref 11.5–15.5)
WBC: 6.6 K/uL (ref 4.0–10.5)
nRBC: 0 % (ref 0.0–0.2)

## 2024-10-15 LAB — LIPOPROTEIN A (LPA): Lipoprotein (a): 10.2 nmol/L (ref <75.0)

## 2024-10-15 MED ORDER — ASPIRIN 81 MG PO TBEC: 81.0000 mg | DELAYED_RELEASE_TABLET | Freq: Every day | ORAL | 12 refills | Status: DC

## 2024-10-15 MED ORDER — NITROGLYCERIN 0.4 MG SL SUBL: 0.4000 mg | SUBLINGUAL_TABLET | SUBLINGUAL | 12 refills | Status: DC | PRN

## 2024-10-15 NOTE — Discharge Summary (Signed)
 NAME: Francisco Collier, Francisco L. MEDICAL RECORD NO: [id number] ACCOUNT NO: [id number] DATE OF BIRTH: 04/20/1941 FACILITY: [facility] LOCATION: [id number] PHYSICIAN: Broxton Broady N. Levern, MD  Discharge Summary   DATE OF DISCHARGE: 10/15/2024  ADMITTING PHYSICIAN: Dr. Claudene  DATE OF ADMISSION: 10/12/2024  DATE OF DISCHARGE: 10/15/2024  ADMITTING DIAGNOSES: 1. Acute coronary syndrome. 2. Coronary artery disease, history of anteroseptal wall MI in remote past, status post PCI to LAD and RCA. 3. Chronic small pericardial effusion. 4. Hypertension. 5. Hyperlipidemia. 6. Prediabetic. 7. COPD. 8. Bilateral pulmonary nodules. 9. Left thyroid  nodule. 10. History of colon cancer. 11. History of thyroid  surgery in the past.  FINAL DIAGNOSES: 1. Stable angina. 2. MI ruled out. 3. Negative nuclear stress test with no evidence of reversible ischemia, EF 49%, coronary artery disease, history of anteroseptal wall MI in the remote past, status post PCI to LAD in the past. 4. Hypertension. 5. Prediabetic. 6. History of TIA/aborted stroke in the past. 7. Hyperlipidemia. 8. COPD. 9. Bilateral pulmonary nodules, benign. 10. Left thyroid  nodule. 11. History of colon cancer, status post thyroid  surgery in the past.  DISCHARGE HOME MEDICATIONS: 1. Aspirin  81 mg one tablet daily. 2. Nitrostat  0.4 mg sublingual every 5 minutes x 3 dose as directed. 3. Continue rest of his home medications, i.e., amlodipine  5 mg daily. 4. Breztri 160/9/4.8 mcg two puffs daily as needed. 5. Clopidogrel  75 mg one tablet daily. 6. Vitamin D3 one tablet daily at bedtime. 7. Fenofibrate  160 mg daily. 8. Fish oil 1 capsule at bedtime. 9. Lisinopril  10 mg daily. 10. Percocet half tablet three times daily as needed for severe pain. 11. Pantoprazole  40 mg daily. 12. Vitamin C one tablet daily. 13. Vitamin B12 1 capsule daily. 14. Metoprolol  tartrate 12.5 mg twice daily.  DIET: Low salt, low cholesterol 1800 calories, ADA  diet.  FOLLOWUP:  1.  Monitor blood pressure daily and chart.  2.  Follow up with me in 1 week.  CONDITION AT DISCHARGE: Stable.  BRIEF HISTORY AND HOSPITAL COURSE: The patient is an 83 year old male with past medical history significant for coronary artery disease, status post anteroseptal wall MI in the past, status post PCI to LAD in 2005 and RCA in 2016, history of CVA, history  of aborted CVA/TIA, history of positional vertigo, hypertension, hyperlipidemia, prediabetic, chronic back pain, diverticulosis, history of sigmoid colon malignancy, came to ER by EMS complaining of central chest pain radiating to the back. Chest pain  resolved after sublingual nitro x 2 by EMS. His nausea was controlled by Zofran . EKG showed sinus rhythm with right bundle branch block. Chest x-ray was suggestive of possible pneumonia, but CT was negative for aortic dissection and pneumonia, but  positive for small lung nodules and left thyroid  nodule.  PHYSICAL EXAMINATION: VITAL SIGNS: Blood pressure was 121/65, pulse 84, temperature was 97.8 degrees Fahrenheit. GENERAL: He was alert, awake, oriented x 3, cooperative, appears stated age, in no acute distress. HEENT: Head: Normocephalic, atraumatic.  Eyes:  Blue eyes, conjunctiva pink. Cornea clear. NECK: No adenopathy. No carotid bruit. No JVD. Neck was supple, symmetrical. Trachea was midline. Thyroid  was not enlarged. LUNGS: Lungs were clear to auscultation bilaterally. HEART: Regular rate and rhythm. S1 and S2 was normal. There was a grade II/VI systolic murmur. No click, rub or gallop. ABDOMEN: Soft, nontender. Bowel sounds were normal. No organomegaly. EXTREMITIES: No clubbing, cyanosis or edema.  Skin was warm and dry. NEUROLOGIC: Alert and oriented x 3. Normal strength. Normal coordination and gait.  LABORATORY DATA: His labs:  Sodium was 136, potassium 4.0, glucose was 115, BUN 10, creatinine 1.01, hemoglobin was 12.8, hematocrit 40.1, white count of  7.5.  IMAGING: The patient had CT angio, which showed no acute findings. No evidence of thoracoabdominal aortic aneurysm or dissection. Small pericardial effusion. There was a 1.9 cm left thyroid  nodule, recommended further evaluation with thyroid  ultrasound  and there were also scattered bilateral pulmonary nodules measuring up to 4 mm. No follow-up needed for these nodules. Two sets of high sensitivity troponin I were negative. Lexiscan  Myoview  showed no reversible ischemia or infarction. Normal left  ventricular wall motion. Left ventricular ejection fraction was 49%.  BRIEF HOSPITAL COURSE: The patient was admitted to telemetry unit. MI was ruled out by serial enzymes and EKG.  The patient was started on IV heparin  and nitrates. The patient did not have any further episodes of chest pain during the hospital stay.  The  patient subsequently underwent Lexiscan  Myoview , which showed no evidence of reversible ischemia with EF of 49%.  The patient's heparin  is off since last night. The patient is ambulating in the room without any problems. The patient will be discharged  home on above medications and will be followed up in my office in one week.    MUK D: 10/15/2024 9:36:01 am T: 10/15/2024 9:52:00 am  JOB: [id number]/ [id number]

## 2024-10-15 NOTE — Progress Notes (Signed)
 DISCHARGE NOTE HOME ISSACHAR BROADY to be discharged Home per MD order. Discussed prescriptions and follow up appointments with the patient. Prescriptions given to patient; medication list explained in detail. Patient verbalized understanding.  Skin clean, dry and intact without evidence of skin break down, no evidence of skin tears noted. IV catheter discontinued intact. Site without signs and symptoms of complications. Dressing and pressure applied. Pt denies pain at the site currently. No complaints noted.  Patient free of lines, drains, and wounds.   An After Visit Summary (AVS) was printed and given to the patient. Patient escorted via wheelchair, and discharged home via private auto.  Peyton SHAUNNA Pepper, RN

## 2024-10-15 NOTE — Plan of Care (Signed)
  Problem: Cardiac: Goal: Ability to achieve and maintain adequate cardiovascular perfusion will improve Outcome: Progressing   Problem: Health Behavior/Discharge Planning: Goal: Ability to safely manage health-related needs after discharge will improve Outcome: Progressing   Problem: Education: Goal: Knowledge of General Education information will improve Description: Including pain rating scale, medication(s)/side effects and non-pharmacologic comfort measures Outcome: Progressing   Problem: Clinical Measurements: Goal: Ability to maintain clinical measurements within normal limits will improve Outcome: Progressing Goal: Will remain free from infection Outcome: Progressing Goal: Diagnostic test results will improve Outcome: Progressing Goal: Respiratory complications will improve Outcome: Progressing Goal: Cardiovascular complication will be avoided Outcome: Progressing   Problem: Activity: Goal: Risk for activity intolerance will decrease Outcome: Progressing   Problem: Nutrition: Goal: Adequate nutrition will be maintained Outcome: Progressing   Problem: Coping: Goal: Level of anxiety will decrease Outcome: Progressing   Problem: Elimination: Goal: Will not experience complications related to bowel motility Outcome: Progressing Goal: Will not experience complications related to urinary retention Outcome: Progressing   Problem: Pain Managment: Goal: General experience of comfort will improve and/or be controlled Outcome: Progressing   Problem: Safety: Goal: Ability to remain free from injury will improve Outcome: Progressing   Problem: Skin Integrity: Goal: Risk for impaired skin integrity will decrease Outcome: Progressing

## 2024-10-15 NOTE — Discharge Summary (Signed)
 Discharge summary dictated on 10/15/2024 dictation number is [id number]

## 2024-10-15 NOTE — TOC CM/SW Note (Signed)
 Transition of Care [facility]) - Inpatient Brief Assessment   Patient Details  Name: Francisco Collier MRN: [id number] Date of Birth: August 10, 1941  Transition of Care [facility]) CM/SW Contact:    Sudie Erminio Deems, RN Phone Number: 10/15/2024, 10:37 AM   Clinical Narrative: Patient presented for chest pain-post stress test. PTA patient was independent from home alone. Patient's brother was at the bedside during the visit. Patient states he has PCP and he drives to appointments. Patient has DME cane and rolling walker. Brother will transport patient home. No further home needs identified.    Transition of Care Asessment: Insurance and Status: Insurance coverage has been reviewed Patient has primary care physician: Yes Home environment has been reviewed: reviewed Prior level of function:: independent Prior/Current Home Services: No current home services Social Drivers of Health Review: SDOH reviewed no interventions necessary Readmission risk has been reviewed: Yes Transition of care needs: no transition of care needs at this time

## 2024-10-30 ENCOUNTER — Other Ambulatory Visit: Payer: Self-pay | Admitting: Nurse Practitioner

## 2024-10-30 DIAGNOSIS — E041 Nontoxic single thyroid nodule: Secondary | ICD-10-CM

## 2025-01-12 DEATH — deceased
